# Patient Record
Sex: Male | Born: 1970
Health system: Southern US, Community
[De-identification: ages and names within clinical notes are randomized; demographics above are authoritative.]

## PROBLEM LIST (undated history)

## (undated) DIAGNOSIS — E119 Type 2 diabetes mellitus without complications: Secondary | ICD-10-CM

## (undated) DIAGNOSIS — I2510 Atherosclerotic heart disease of native coronary artery without angina pectoris: Secondary | ICD-10-CM

## (undated) DIAGNOSIS — I219 Acute myocardial infarction, unspecified: Secondary | ICD-10-CM

## (undated) DIAGNOSIS — I509 Heart failure, unspecified: Secondary | ICD-10-CM

## (undated) DIAGNOSIS — I255 Ischemic cardiomyopathy: Secondary | ICD-10-CM

## (undated) DIAGNOSIS — I519 Heart disease, unspecified: Secondary | ICD-10-CM

## (undated) HISTORY — DX: Heart disease, unspecified: I51.9

## (undated) HISTORY — DX: Type 2 diabetes mellitus without complications: E11.9

## (undated) HISTORY — PX: BACK SURGERY: SHX140

## (undated) HISTORY — PX: CARDIAC CATHETERIZATION: SHX172

---

## 1998-01-31 ENCOUNTER — Other Ambulatory Visit: Admission: RE | Admit: 1998-01-31 | Discharge: 1998-01-31 | Payer: Self-pay | Admitting: Cardiology

## 2001-06-28 ENCOUNTER — Emergency Department (HOSPITAL_COMMUNITY): Admission: AC | Admit: 2001-06-28 | Discharge: 2001-06-28 | Payer: Self-pay

## 2004-02-14 ENCOUNTER — Emergency Department (HOSPITAL_COMMUNITY): Admission: EM | Admit: 2004-02-14 | Discharge: 2004-02-14 | Payer: Self-pay | Admitting: Emergency Medicine

## 2004-06-10 ENCOUNTER — Emergency Department (HOSPITAL_COMMUNITY): Admission: EM | Admit: 2004-06-10 | Discharge: 2004-06-10 | Payer: Self-pay | Admitting: Family Medicine

## 2004-10-31 ENCOUNTER — Emergency Department: Payer: Self-pay | Admitting: Emergency Medicine

## 2004-11-01 ENCOUNTER — Emergency Department: Payer: Self-pay | Admitting: Unknown Physician Specialty

## 2004-12-24 ENCOUNTER — Emergency Department: Payer: Self-pay | Admitting: Emergency Medicine

## 2005-02-14 ENCOUNTER — Emergency Department: Payer: Self-pay | Admitting: Emergency Medicine

## 2006-06-23 ENCOUNTER — Emergency Department: Payer: Self-pay | Admitting: Emergency Medicine

## 2007-02-03 ENCOUNTER — Ambulatory Visit: Admission: RE | Admit: 2007-02-03 | Discharge: 2007-02-03 | Payer: Self-pay | Admitting: Orthopedic Surgery

## 2007-02-06 ENCOUNTER — Emergency Department: Payer: Self-pay | Admitting: Emergency Medicine

## 2007-02-24 ENCOUNTER — Inpatient Hospital Stay (HOSPITAL_COMMUNITY): Admission: RE | Admit: 2007-02-24 | Discharge: 2007-03-01 | Payer: Self-pay | Admitting: Orthopedic Surgery

## 2007-02-24 ENCOUNTER — Encounter (INDEPENDENT_AMBULATORY_CARE_PROVIDER_SITE_OTHER): Payer: Self-pay | Admitting: Orthopedic Surgery

## 2007-10-16 ENCOUNTER — Emergency Department: Payer: Self-pay | Admitting: Emergency Medicine

## 2007-12-12 ENCOUNTER — Emergency Department: Payer: Self-pay | Admitting: Emergency Medicine

## 2008-01-02 ENCOUNTER — Emergency Department: Payer: Self-pay | Admitting: Emergency Medicine

## 2008-01-07 ENCOUNTER — Emergency Department (HOSPITAL_COMMUNITY): Admission: EM | Admit: 2008-01-07 | Discharge: 2008-01-07 | Payer: Self-pay | Admitting: Emergency Medicine

## 2008-09-10 ENCOUNTER — Emergency Department: Payer: Self-pay | Admitting: Emergency Medicine

## 2009-03-12 ENCOUNTER — Emergency Department: Payer: Self-pay | Admitting: Emergency Medicine

## 2009-04-10 ENCOUNTER — Emergency Department: Payer: Self-pay | Admitting: Emergency Medicine

## 2009-04-22 ENCOUNTER — Emergency Department (HOSPITAL_COMMUNITY): Admission: EM | Admit: 2009-04-22 | Discharge: 2009-04-22 | Payer: Self-pay | Admitting: Emergency Medicine

## 2009-05-21 ENCOUNTER — Emergency Department: Payer: Self-pay | Admitting: Emergency Medicine

## 2010-02-28 ENCOUNTER — Emergency Department (HOSPITAL_COMMUNITY): Admission: EM | Admit: 2010-02-28 | Discharge: 2010-03-01 | Payer: Self-pay | Admitting: Emergency Medicine

## 2010-11-04 LAB — URINALYSIS, ROUTINE W REFLEX MICROSCOPIC
Bilirubin Urine: NEGATIVE
Glucose, UA: NEGATIVE mg/dL
Hgb urine dipstick: NEGATIVE
Ketones, ur: NEGATIVE mg/dL
Leukocytes, UA: NEGATIVE
Nitrite: NEGATIVE
Protein, ur: >300 mg/dL — AB
Specific Gravity, Urine: 1.018 (ref 1.005–1.030)
Urobilinogen, UA: 1 mg/dL (ref 0.0–1.0)
pH: 6 (ref 5.0–8.0)

## 2010-11-04 LAB — COMPREHENSIVE METABOLIC PANEL
ALT: 28 U/L (ref 0–53)
AST: 21 U/L (ref 0–37)
Albumin: 3.5 g/dL (ref 3.5–5.2)
Alkaline Phosphatase: 68 U/L (ref 39–117)
BUN: 12 mg/dL (ref 6–23)
CO2: 27 mEq/L (ref 19–32)
Calcium: 9 mg/dL (ref 8.4–10.5)
Chloride: 101 mEq/L (ref 96–112)
Creatinine, Ser: 1.1 mg/dL (ref 0.4–1.5)
GFR calc Af Amer: >60 mL/min (ref >60)
GFR calc non Af Amer: >60 mL/min (ref >60)
Glucose, Bld: 269 mg/dL — ABNORMAL HIGH (ref 70–99)
Potassium: 3.6 mEq/L (ref 3.5–5.1)
Sodium: 137 mEq/L (ref 135–145)
Total Bilirubin: 0.4 mg/dL (ref 0.3–1.2)
Total Protein: 6.8 g/dL (ref 6.0–8.3)

## 2010-11-04 LAB — CBC
HCT: 47.7 % (ref 39.0–52.0)
Hemoglobin: 16.3 g/dL (ref 13.0–17.0)
MCH: 33.3 pg (ref 26.0–34.0)
MCHC: 34.2 g/dL (ref 30.0–36.0)
MCV: 97.3 fL (ref 78.0–100.0)
Platelets: 155 10*3/uL (ref 150–400)
RBC: 4.91 MIL/uL (ref 4.22–5.81)
RDW: 12.8 % (ref 11.5–15.5)
WBC: 10.3 10*3/uL (ref 4.0–10.5)

## 2010-11-04 LAB — DIFFERENTIAL
Basophils Absolute: 0.1 10*3/uL (ref 0.0–0.1)
Basophils Relative: 1 % (ref 0–1)
Eosinophils Absolute: 0.2 10*3/uL (ref 0.0–0.7)
Eosinophils Relative: 2 % (ref 0–5)
Lymphocytes Relative: 36 % (ref 12–46)
Lymphs Abs: 3.7 10*3/uL (ref 0.7–4.0)
Monocytes Absolute: 0.6 10*3/uL (ref 0.1–1.0)
Monocytes Relative: 6 % (ref 3–12)
Neutro Abs: 5.8 10*3/uL (ref 1.7–7.7)
Neutrophils Relative %: 56 % (ref 43–77)

## 2010-11-04 LAB — URINE MICROSCOPIC-ADD ON

## 2010-11-04 LAB — GLUCOSE, CAPILLARY
Glucose-Capillary: 198 mg/dL — ABNORMAL HIGH (ref 70–99)
Glucose-Capillary: 298 mg/dL — ABNORMAL HIGH (ref 70–99)

## 2011-01-01 NOTE — Op Note (Signed)
[name], STEELY NO.:  [id number]   MEDICAL RECORD NO.:  [id number]          PATIENT TYPE:  INP   LOCATION:  5018                         FACILITY:  [facility]   PHYSICIAN:  Dimas Alexandria, MD      DATE OF BIRTH:  1970-11-11   DATE OF PROCEDURE:  02/24/2007  DATE OF DISCHARGE:                               OPERATIVE REPORT   SURGEON:  Dr. Dimas Alexandria.   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   OPERATIVE PROCEDURE:  Bilateral laminectomies, L2-3, L3-4, L4-5, L5-S1;  left L4-5 disk excision.   INDICATIONS FOR SURGERY:  Chronic disabling left sciatic pain.   OPERATIVE NOTE:  The patient was placed under general endotracheal  anesthesia.  A Foley catheter was placed in the bladder.  Bilateral  sequential compression devices were placed on both lower extremities.  Intravenous antibiotics had been administered prophylactically.  He was  positioned prone on the [place] frame.  Upper extremities were  positioned so as to avoid hyperflexion and abduction of the shoulders  and so as to avoid hyperflexion of the elbows.  The upper extremities  were padded from axilla to hands with foam.  The thighs, knees, shins  and ankles were supported on pillows.  Hair was clipped from the lumbar  area.  The midline was marked with the skin marker over the lower lumbar  spine.  The skin was prepped with DuraPrep and draped in rectangular  fashion.  The drapes were secured with Ioban.   The skin was scored in line with the mark made by the skin marker.  The  subcutaneous tissue and paraspinal muscles and fascia were injected  bilaterally with a mixture of quarter percent Marcaine plain and 1%  lidocaine with epinephrine.  Dissection was then carried down to the  lower spinous processes.  Paraspinal muscles  were then immobilized  bilaterally L2-S1.  Cross-table lateral radiograph was taken with  Kochers on the spinous processes of L2 and L4 which did conform to our  impression of level, based on identifying the last mobile segment and  counting proximally.  Should be noted an x-ray was taken layer with a  ball-tipped nerve hook in the L4-5 disk space which confirmed our  operative level for disk excision.   After soft tissue had been cleaned off the lamina and spinous processes  from L2-L5, the spinous processes were removed from L2-L5 with a Horsley  rongeur.  We then used a high-speed bur to thin out the lamina and  perform medial facetectomy at L5 bilaterally and L4 bilaterally.  Laminectomies were then completed with Kerrison rongeurs.  The same  process was repeated at L3 and L2.  We performed lateral decompression  while standing on the left side of the table of the right side of the  laminectomy.  This involved undercutting the facette joints at L2-3, L3-  4, L4-5, L5-S1.   I then changed sides of the table and  did the same on the right side,  however, the origins of the L5 nerve root were extremely tight at the L4-  5 disk level where there was a disk herniation on the MRI.  Ultimately  we performed a more significant facetectomy at L4-5 on the left-sided  order to expose some of the disk laterally and able to gain access to  it.  The disk was perforated and then curetted with Epstein curettes and  as much extruded disk as possible pushed back into the disk space and  retrieved.  However, there was tremendous adherence of the extruded disk  fragment to the overlying L5 nerve root.  Attempts to dissect this off  with a nerve hook resulted in a small tear in the root sleeve and a very  small amount of spinal fluid leakage.  I eventually desisted, feeling  that there was risking damaging the nerve root with any further  manipulation to try to free it from the adherent extruded  fragment.  The  disk herniation in this the patient is very chronic.   I eventually placed a piece of Duraform (Duragen) over the lateral  aspect of the L5 root sleeve and then placed a large muscle graft over  the entire laminectomy defect at this level at that level.  There was  this appeared to control leak well.  It was a very minor leak and there  was very small amount of CSF exuding prior to placing the Duraform.   We then performed a multilayered closure, using figure-of-eight  interrupted #1 Vicryl sutures in the paraspinal muscles subfascially and  then closing the thoracolumbar fascia with multiple interrupted figure-  of-eight #1 Vicryl sutures in an attempt to create a watertight seal.  The subcutaneous layer was closed using multiple interrupted 2-0 Vicryl  sutures in inverted fashion.  Skin was closed with subcuticular undyed  continuous Vicryl suture.  The skin edges were reinforced with Steri-  Strips and antibiotic ointment dressing applied and secured with OpSite.   The sponge, needle counts were correct.  The blood loss estimated at 1  liter.  The patient did receive a unit of Cell Saver blood back.  As  noted the complication was a very small dural leak associated with  perforation of the epineurium on the root sleeve proximally at the left  L5.      Dimas Alexandria, MD  Electronically Signed     MT/MEDQ  D:  02/24/2007  T:  02/25/2007  Job:  [id number]

## 2011-01-01 NOTE — H&P (Signed)
NAMELEONDRE, TAUL NO.:  [id number]   MEDICAL RECORD NO.:  [id number]          PATIENT TYPE:  INP   LOCATION:  NA                           FACILITY:  [facility]   PHYSICIAN:  Dimas Alexandria, MD      DATE OF BIRTH:  1971/04/16   DATE OF ADMISSION:  DATE OF DISCHARGE:                              HISTORY & PHYSICAL   CHIEF COMPLAINT:  Left leg pain with lumbar pain.  Left leg pain travels  from the posterior lumbar down the left buttock lateral aspect of left  leg to the calf.   HISTORY:  Spinal stenosis, congenital.   DRUG ALLERGIES:  No known drug allergies.   CURRENT MEDICATIONS:  Lyrica 75 mg b.i.d., Skelaxin 800 mg q.8 hours,  Vicodin 5/500 mg approximately 4 a day.   SOCIAL HISTORY:  He does smoke about 1-1/2 packs a day.  He is divorced.  He has history of gout.   PAST SURGICAL HISTORY:  Wisdom teeth extraction.   FAMILY HISTORY:  Hypertension, coronary artery disease, gout, kidney  trouble, cancer.   REVIEW OF SYSTEMS:  He reports no fever, no chills, no night sweats, no  shortness of breath, no chest pain, no bowel or bladder incontinence, no  nausea, vomiting, or diarrhea.  He does have leg pain on the left after  walking.   PHYSICAL EXAMINATION:  VITAL SIGNS:  Temperature 98.8, respirations 12,  pulse 64, blood pressure 134/80.  GENERAL:  He is well developed, well nourished.  HEENT:  Pupils are equal, round, reactive to light.  NECK:  He has active range of motion of the neck.  It is supple.  No  pain, no tenderness.  CHEST:  Clear to auscultation.  No wheezes noted.  HEART:  Regular rate and rhythm.  No murmur noted.  ABDOMEN:  Soft, nontender to palpation.  Positive bowel sounds.   IMPRESSION:  Spinal stenosis with disk herniation, L2-3, L3-4, L4-5, L5-  S1.   PLAN:  Laminectomy at L2-3, L3-4, L4-5, L5-S1 by Dr. Dimas Alexandria.      Gerri Lins, P.A.      Dimas Alexandria, MD  Electronically Signed    MC/MEDQ  D:  01/29/2007   T:  01/30/2007  Job:  [id number]

## 2011-01-04 NOTE — Discharge Summary (Signed)
[name], [name] NO.:  [id number]   MEDICAL RECORD NO.:  [id number]          PATIENT TYPE:  INP   LOCATION:  5018                         FACILITY:  [facility]   PHYSICIAN:  Gerri Lins, P.A.  DATE OF BIRTH:  [birth year]   DATE OF ADMISSION:  02/24/2007  DATE OF DISCHARGE:  03/01/2007                               DISCHARGE SUMMARY   ADMITTING DIAGNOSIS:  Spinal stenosis, congenital.   BRIEF HISTORY OF STAY:  [name] was admitted to [facility]  on February 24, 2007 for a surgical procedure by Dr. Dimas Alexandria.  Planed  procedure is laminectomy L2-3, L3-4, L4-5, L5-S1, posterior lumbar spine  secondary to spinal stenosis.  Postoperatively, the patient was in  stable condition.  Distally neurovascularly and motor intact bilateral  lower extremities.  The patient states his left foot feels better than  prior to surgery.   Post-op day 1 on February 25, 2007, the patient state she can wiggle his toes  now on the left side, and he could not do that prior to surgery.  Electrolytes were within normal limits.  Hemoglobin stable at 14.9,  white blood cell count 13.3.  We have him on flatbed rest secondary to  dural leak during the procedure.  Active range of motion of the toes and  ankles are intact and equal bilaterally.  Decreased sensation in the  left foot compared to right, and no significant change from prior to  surgery.  Calves were soft, nontender to palpation.  Dressing had two  quarter-sized bloody drainage dressings, shows no active clear fluid  drainage evidence.  Continued flatbed rest.  Advance diet once flatulent  has passed.   Post-op day 2, the patient has no headache complaints.  Left foot  sensation is returning.  He is afebrile.  Hemoglobin stable at 14.8,  white count 13.3, potassium 3.4.  Remainder of the electrolytes within  normal limits.  Distally neurovascularly and motor intact grossly.  Calves were soft, nontender to palpation.   Clean, dry dressing applied.  No active drainage at incision site.  Continue flatbed rest until February 27, 2007, 6 p.m.   Post-op day 3, active range of motion and sensation of bilateral lower  extremities has increased.  Decreased pain report.  Afebrile.  Dressing  is dry.  May begin ambulation at 1800 on February 27, 2007.   February 28, 2007, post-op day 4, patient out of bed, into chair.  States he  has no headache.  There is no drainage on the dressing.  He is afebrile.  Wound is clean and dry.  No signs of dural leak.  We are going to  discontinue the Foley, discontinue the PCA.  Continue his physical  therapy, and plan for discharge home in 1 to 2 days.   Post-op day 5, March 01, 2007, the patient is afebrile.  Vital signs are  stable.  Hemoglobin stable at 14.7, white count 7.1, dressings clean and  dry.  Incision site, no active drainage distally.  Bilateral lower  extremities are grossly neurovascular intact.  Calves are soft,  nontender  to palpation.  Plan to discharge the patient home today.   DISCHARGE DIAGNOSES:  1. Status post lumbar laminectomy, posterior lumbar spine, L2-3, L3-4,      L4-5, L5-S1.  2. Repair of dural tear.   DISCHARGE INSTRUCTIONS:  The patient will do no bending, lifting,  stooping, squatting.  He will walk for exercise daily.  We will have him  follow up with Dr. Dimas Alexandria in approximately 4 weeks.  Discharging  him with pain medications of Norco 10/325 1-2 q.4 p.r.n. for pain  control.  Dry dressing until no drainage.  He may shower.  Diet was  regular, and he is going to follow up in mid August.   PRESURGERY LABS:  Labs taken on February 19, 2007:  White count 11.3,  hemoglobin 17.6, platelets 169.  PT 12.8, INR 1.0, PTT 33.  Sodium 137,  potassium 3.9, chloride 104, CO2 of 28, glucose 152, BUN 10, and  creatinine 0.94.   DISPOSITION:  Stable.      Gerri Lins, P.A.     MC/MEDQ  D:  04/17/2007  T:  04/17/2007  Job:  [id number]

## 2011-06-04 LAB — BASIC METABOLIC PANEL
BUN: 4 — ABNORMAL LOW
BUN: 4 — ABNORMAL LOW
BUN: 5 — ABNORMAL LOW
BUN: 9
BUN: 9
CO2: 28
CO2: 29
CO2: 29
CO2: 30
CO2: 30
Calcium: 8.5
Calcium: 8.5
Calcium: 8.6
Calcium: 8.7
Calcium: 9
Chloride: 102
Chloride: 96
Chloride: 97
Chloride: 98
Chloride: 99
Creatinine, Ser: 0.89
Creatinine, Ser: 0.89
Creatinine, Ser: 0.9
Creatinine, Ser: 0.95
Creatinine, Ser: 0.99
GFR calc Af Amer: >60
GFR calc Af Amer: >60
GFR calc Af Amer: >60
GFR calc Af Amer: >60
GFR calc Af Amer: >60
GFR calc non Af Amer: >60
GFR calc non Af Amer: >60
GFR calc non Af Amer: >60
GFR calc non Af Amer: >60
GFR calc non Af Amer: >60
Glucose, Bld: 140 — ABNORMAL HIGH
Glucose, Bld: 152 — ABNORMAL HIGH
Glucose, Bld: 156 — ABNORMAL HIGH
Glucose, Bld: 160 — ABNORMAL HIGH
Glucose, Bld: 166 — ABNORMAL HIGH
Potassium: 3.1 — ABNORMAL LOW
Potassium: 3.2 — ABNORMAL LOW
Potassium: 3.4 — ABNORMAL LOW
Potassium: 3.4 — ABNORMAL LOW
Potassium: 3.7
Sodium: 135
Sodium: 135
Sodium: 136
Sodium: 136
Sodium: 138

## 2011-06-04 LAB — URINALYSIS, ROUTINE W REFLEX MICROSCOPIC
Bilirubin Urine: NEGATIVE
Glucose, UA: NEGATIVE
Hgb urine dipstick: NEGATIVE
Ketones, ur: NEGATIVE
Leukocytes, UA: NEGATIVE
Nitrite: NEGATIVE
Protein, ur: >300 — AB
Specific Gravity, Urine: 1.016
Urobilinogen, UA: 0.2
pH: 5.5

## 2011-06-04 LAB — CBC
HCT: 41.2
HCT: 41.5
HCT: 42.1
HCT: 42.7
HCT: 43
HCT: 50.1
Hemoglobin: 14.4
Hemoglobin: 14.5
Hemoglobin: 14.7
Hemoglobin: 14.8
Hemoglobin: 14.9
Hemoglobin: 17.6 — ABNORMAL HIGH
MCHC: 34.7
MCHC: 34.7
MCHC: 34.9
MCHC: 35
MCHC: 35
MCHC: 35.2
MCV: 93.7
MCV: 94.6
MCV: 94.9
MCV: 95.3
MCV: 95.6
MCV: 95.9
Platelets: 144 — ABNORMAL LOW
Platelets: 154
Platelets: 162
Platelets: 169
Platelets: 171
Platelets: 209
RBC: 4.34
RBC: 4.39
RBC: 4.4
RBC: 4.48
RBC: 4.49
RBC: 5.35
RDW: 12.5
RDW: 12.6
RDW: 12.6
RDW: 12.7
RDW: 12.8
RDW: 12.8
WBC: 11.3 — ABNORMAL HIGH
WBC: 12.3 — ABNORMAL HIGH
WBC: 13.3 — ABNORMAL HIGH
WBC: 13.3 — ABNORMAL HIGH
WBC: 7.1
WBC: 7.4

## 2011-06-04 LAB — COMPREHENSIVE METABOLIC PANEL
ALT: 28
AST: 21
Albumin: 3.8
Alkaline Phosphatase: 59
BUN: 10
CO2: 28
Calcium: 9.3
Chloride: 104
Creatinine, Ser: 0.94
GFR calc Af Amer: >60
GFR calc non Af Amer: >60
Glucose, Bld: 152 — ABNORMAL HIGH
Potassium: 3.9
Sodium: 137
Total Bilirubin: 0.8
Total Protein: 7.5

## 2011-06-04 LAB — URINE CULTURE
Colony Count: NO GROWTH
Culture: NO GROWTH

## 2011-06-04 LAB — DIFFERENTIAL
Basophils Absolute: 0
Basophils Relative: 0
Eosinophils Absolute: 0.3
Eosinophils Relative: 2
Lymphocytes Relative: 25
Lymphs Abs: 2.8
Monocytes Absolute: 0.2
Monocytes Relative: 2 — ABNORMAL LOW
Neutro Abs: 8 — ABNORMAL HIGH
Neutrophils Relative %: 71

## 2011-06-04 LAB — PROTIME-INR
INR: 1
Prothrombin Time: 12.8

## 2011-06-04 LAB — APTT: aPTT: 33

## 2011-06-04 LAB — URINE MICROSCOPIC-ADD ON

## 2011-06-04 LAB — TYPE AND SCREEN
ABO/RH(D): A POS
Antibody Screen: NEGATIVE

## 2011-06-04 LAB — HEMOGLOBIN AND HEMATOCRIT, BLOOD
HCT: 45.8
Hemoglobin: 15.9

## 2011-06-05 LAB — TYPE AND SCREEN
ABO/RH(D): A POS
Antibody Screen: NEGATIVE

## 2011-06-05 LAB — ABO/RH: ABO/RH(D): A POS

## 2011-06-06 LAB — URINE MICROSCOPIC-ADD ON
RBC / HPF: NONE SEEN
WBC, UA: NONE SEEN

## 2011-06-06 LAB — CBC
HCT: 47.6
Hemoglobin: 16.4
MCHC: 34.4
MCV: 95.3
Platelets: 198
RBC: 4.99
RDW: 12.4
WBC: 11.9 — ABNORMAL HIGH

## 2011-06-06 LAB — URINALYSIS, ROUTINE W REFLEX MICROSCOPIC
Bilirubin Urine: NEGATIVE
Glucose, UA: NEGATIVE
Hgb urine dipstick: NEGATIVE
Ketones, ur: NEGATIVE
Leukocytes, UA: NEGATIVE
Nitrite: NEGATIVE
Protein, ur: >300
Specific Gravity, Urine: 1.016 (ref 1.005–1.035)
Urobilinogen, UA: 0.2
pH: 6

## 2011-06-06 LAB — COMPREHENSIVE METABOLIC PANEL
ALT: 27
AST: 24
Albumin: 3.8
Alkaline Phosphatase: 64
BUN: 10
CO2: 24
Calcium: 9
Chloride: 106
Creatinine, Ser: 1.2
GFR calc Af Amer: >60
GFR calc non Af Amer: >60
Glucose, Bld: 107 — ABNORMAL HIGH
Potassium: 3.7
Sodium: 138
Total Bilirubin: 0.7
Total Protein: 6.9

## 2011-06-06 LAB — PROTIME-INR
INR: 1
Prothrombin Time: 13.2

## 2011-06-06 LAB — URINE CULTURE: Colony Count: 15000

## 2011-06-06 LAB — DIFFERENTIAL
Basophils Absolute: 0.1
Basophils Relative: 0
Eosinophils Absolute: 0.2
Eosinophils Relative: 2
Lymphocytes Relative: 27
Lymphs Abs: 3.2
Monocytes Absolute: 0.8 — ABNORMAL HIGH
Monocytes Relative: 7
Neutro Abs: 7.5
Neutrophils Relative %: 64

## 2011-06-06 LAB — APTT: aPTT: 33

## 2015-04-13 ENCOUNTER — Ambulatory Visit (INDEPENDENT_AMBULATORY_CARE_PROVIDER_SITE_OTHER): Payer: Self-pay | Admitting: Physician Assistant

## 2015-04-13 VITALS — BP 136/68 | HR 83 | Temp 98.6°F | Resp 18 | Ht 73.0 in | Wt 243.0 lb

## 2015-04-13 DIAGNOSIS — E119 Type 2 diabetes mellitus without complications: Secondary | ICD-10-CM

## 2015-04-13 DIAGNOSIS — R7309 Other abnormal glucose: Secondary | ICD-10-CM

## 2015-04-13 LAB — POCT URINALYSIS DIPSTICK
Blood, UA: NEGATIVE
Glucose, UA: 100
Leukocytes, UA: NEGATIVE
Nitrite, UA: NEGATIVE
Protein, UA: >=300
Spec Grav, UA: 1.025
Urobilinogen, UA: 4
pH, UA: 6.5

## 2015-04-13 LAB — POCT UA - MICROSCOPIC ONLY
Bacteria, U Microscopic: NEGATIVE
Crystals, Ur, HPF, POC: NEGATIVE
Epithelial cells, urine per micros: NEGATIVE
Mucus, UA: NEGATIVE
RBC, urine, microscopic: NEGATIVE
WBC, Ur, HPF, POC: NEGATIVE

## 2015-04-13 LAB — COMPREHENSIVE METABOLIC PANEL
ALT: 21 U/L (ref 9–46)
AST: 16 U/L (ref 10–40)
Albumin: 4.3 g/dL (ref 3.6–5.1)
Alkaline Phosphatase: 71 U/L (ref 40–115)
BUN: 18 mg/dL (ref 7–25)
CO2: 25 mmol/L (ref 20–31)
Calcium: 9.2 mg/dL (ref 8.6–10.3)
Chloride: 100 mmol/L (ref 98–110)
Creat: 0.98 mg/dL (ref 0.60–1.35)
Glucose, Bld: 231 mg/dL — ABNORMAL HIGH (ref 65–99)
Potassium: 4.1 mmol/L (ref 3.5–5.3)
Sodium: 139 mmol/L (ref 135–146)
Total Bilirubin: 0.4 mg/dL (ref 0.2–1.2)
Total Protein: 7 g/dL (ref 6.1–8.1)

## 2015-04-13 LAB — GLUCOSE, POCT (MANUAL RESULT ENTRY): POC Glucose: 241 mg/dl — AB (ref 70–99)

## 2015-04-13 LAB — LIPID PANEL
Cholesterol: 224 mg/dL — ABNORMAL HIGH (ref 125–200)
HDL: 29 mg/dL — ABNORMAL LOW (ref >=40)
LDL Cholesterol: 140 mg/dL — ABNORMAL HIGH (ref <130)
Total CHOL/HDL Ratio: 7.7 Ratio — ABNORMAL HIGH (ref <=5.0)
Triglycerides: 277 mg/dL — ABNORMAL HIGH (ref <150)
VLDL: 55 mg/dL — ABNORMAL HIGH (ref <30)

## 2015-04-13 LAB — POCT GLYCOSYLATED HEMOGLOBIN (HGB A1C): Hemoglobin A1C: 8.6

## 2015-04-13 MED ORDER — METFORMIN HCL 500 MG PO TABS: 500.0000 mg | ORAL_TABLET | Freq: Two times a day (BID) | ORAL | Status: DC

## 2015-04-13 NOTE — Patient Instructions (Signed)
Diabetes Mellitus and Food It is important for you to manage your blood sugar (glucose) level. Your blood glucose level can be greatly affected by what you eat. Eating healthier foods in the appropriate amounts throughout the day at about the same time each day will help you control your blood glucose level. It can also help slow or prevent worsening of your diabetes mellitus. Healthy eating may even help you improve the level of your blood pressure and reach or maintain a healthy weight.  HOW CAN FOOD AFFECT ME? Carbohydrates Carbohydrates affect your blood glucose level more than any other type of food. Your dietitian will help you determine how many carbohydrates to eat at each meal and teach you how to count carbohydrates. Counting carbohydrates is important to keep your blood glucose at a healthy level, especially if you are using insulin or taking certain medicines for diabetes mellitus. Alcohol Alcohol can cause sudden decreases in blood glucose (hypoglycemia), especially if you use insulin or take certain medicines for diabetes mellitus. Hypoglycemia can be a life-threatening condition. Symptoms of hypoglycemia (sleepiness, dizziness, and disorientation) are similar to symptoms of having too much alcohol.  If your health care provider has given you approval to drink alcohol, do so in moderation and use the following guidelines:  Women should not have more than one drink per day, and men should not have more than two drinks per day. One drink is equal to:  12 oz of beer.  5 oz of wine.  1 oz of hard liquor.  Do not drink on an empty stomach.  Keep yourself hydrated. Have water, diet soda, or unsweetened iced tea.  Regular soda, juice, and other mixers might contain a lot of carbohydrates and should be counted. WHAT FOODS ARE NOT RECOMMENDED? As you make food choices, it is important to remember that all foods are not the same. Some foods have fewer nutrients per serving than other  foods, even though they might have the same number of calories or carbohydrates. It is difficult to get your body what it needs when you eat foods with fewer nutrients. Examples of foods that you should avoid that are high in calories and carbohydrates but low in nutrients include:  Trans fats (most processed foods list trans fats on the Nutrition Facts label).  Regular soda.  Juice.  Candy.  Sweets, such as cake, pie, doughnuts, and cookies.  Fried foods. WHAT FOODS CAN I EAT? Have nutrient-rich foods, which will nourish your body and keep you healthy. The food you should eat also will depend on several factors, including:  The calories you need.  The medicines you take.  Your weight.  Your blood glucose level.  Your blood pressure level.  Your cholesterol level. You also should eat a variety of foods, including:  Protein, such as meat, poultry, fish, tofu, nuts, and seeds (lean animal proteins are best).  Fruits.  Vegetables.  Dairy products, such as milk, cheese, and yogurt (low fat is best).  Breads, grains, pasta, cereal, rice, and beans.  Fats such as olive oil, trans fat-free margarine, canola oil, avocado, and olives. DOES EVERYONE WITH DIABETES MELLITUS HAVE THE SAME MEAL PLAN? Because every person with diabetes mellitus is different, there is not one meal plan that works for everyone. It is very important that you meet with a dietitian who will help you create a meal plan that is just right for you. Document Released: 05/02/2005 Document Revised: 08/10/2013 Document Reviewed: 07/02/2013 Exit[employer] Patient Information 2015 [employer]. This   information is not intended to replace advice given to you by your health care provider. Make sure you discuss any questions you have with your health care provider.  

## 2015-04-13 NOTE — Progress Notes (Signed)
Subjective:    Patient ID: [name], male    DOB: [birth year], 44 y.o.   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 Told his glucose was in the 300s and was denied a DOT card until able to prove he was getting glucose under control. Has never been on diabetes medication and is looking for PCP at this time due to this problem. Recently started eating more salads, but knows his main problem is potatoes and [facility]. Drinks 2-3 20 oz [facility] sodas/day. Does not like the taste of water so does not drink much. Does not exercise, but does some walking. PMH negative. NKDA.   Review of Systems  Constitutional: Negative for fever, diaphoresis, appetite change and fatigue.  Eyes: Negative for visual disturbance.  Respiratory: Negative for cough, shortness of breath and wheezing.   Cardiovascular: Negative for chest pain, palpitations and leg swelling.  Gastrointestinal: Negative for nausea, vomiting, diarrhea and constipation.  Genitourinary: Negative for dysuria and hematuria.  Neurological: Negative for dizziness and headaches.       Objective:   Physical Exam  Constitutional: He is oriented to person, place, and time. He appears well-developed and well-nourished. No distress.  Blood pressure 136/68, pulse 83, temperature 98.6 F (37 C), temperature source Oral, resp. rate 18, height 6\' 1"  (1.854 m), weight 243 lb (110.224 kg), SpO2 98 %.  HENT:  Head: Normocephalic and atraumatic.  Right Ear: External ear normal.  Left Ear: External ear normal.  Eyes: Conjunctivae are normal. Right eye exhibits no discharge. Left eye exhibits no discharge. No scleral icterus.  Neck: Neck supple. No JVD present. No thyromegaly present.  Cardiovascular: Normal rate, regular rhythm, normal heart sounds and intact distal pulses.  Exam reveals no gallop and no friction rub.   No murmur heard. Pulmonary/Chest: Effort normal  and breath sounds normal. No respiratory distress. He has no wheezes. He has no rales.  Abdominal: Soft. Bowel sounds are normal. He exhibits no distension. There is no tenderness. There is no rebound and no guarding.  Musculoskeletal: He exhibits no edema.  Lymphadenopathy:    He has no cervical adenopathy.  Neurological: He is alert and oriented to person, place, and time.  Skin: Skin is warm and dry. No rash noted. He is not diaphoretic. No erythema. No pallor.  Psychiatric: He has a normal mood and affect. His behavior is normal. Judgment and thought content normal.   Results for orders placed or performed in visit on 04/13/15  POCT glucose (manual entry)  Result Value Ref Range   POC Glucose 241 (A) 70 - 99 mg/dl  POCT glycosylated hemoglobin (Hb A1C)  Result Value Ref Range   Hemoglobin A1C 8.6   POCT UA - Microscopic Only  Result Value Ref Range   WBC, Ur, HPF, POC Negative    RBC, urine, microscopic Negative    Bacteria, U Microscopic Negative    Mucus, UA Negative    Epithelial cells, urine per micros Negative    Crystals, Ur, HPF, POC Negative    Casts, Ur, LPF, POC     Yeast, UA    POCT urinalysis dipstick  Result Value Ref Range   Color, UA Amber    Clarity, UA Clear    Glucose, UA 100    Bilirubin, UA Small    Ketones, UA Trace    Spec Grav, UA 1.025    Blood, UA Negative    pH,  UA 6.5    Protein, UA >=300    Urobilinogen, UA 4.0    Nitrite, UA Negative    Leukocytes, UA Negative Negative      Assessment & Plan:  1. Type 2 diabetes mellitus without complication RTC 05/11/15 for glucose re-check. States he will work on trying to [employer] consumption to 2/day and increase water intake. Lifestyle modifications given.  - metFORMIN (GLUCOPHAGE) 500 MG tablet; Take 1 tablet (500 mg total) by mouth 2 (two) times daily with a meal.  Dispense: 30 tablet; Refill: 1  2. Elevated glucose - POCT glucose (manual entry) - POCT glycosylated hemoglobin (Hb  A1C) - POCT UA - Microscopic Only - POCT urinalysis dipstick - Comprehensive metabolic panel - Lipid panel     Janan Ridge PA-C  [facility] 04/13/2015 3:46 PM

## 2015-04-17 ENCOUNTER — Telehealth: Payer: Self-pay | Admitting: Physician Assistant

## 2015-04-17 NOTE — Telephone Encounter (Signed)
Relayed elevated cholesterol. Will hold off on medication at this time and will allow patient to make adjustments to diet and get DM under control. Will re-check lipids in 3 months. Will have DM f/u 4 weeks from last OV. Patient states that he has done well with medication and denies side effects of N/V or abdominal cramping. States he will be back in for f/u and does not have any questions.

## 2015-06-05 ENCOUNTER — Ambulatory Visit
Admission: EM | Admit: 2015-06-05 | Discharge: 2015-06-05 | Disposition: A | Discharge status: Home / Self Care | Payer: PRIVATE HEALTH INSURANCE | Attending: Family Medicine | Admitting: Family Medicine

## 2015-06-05 ENCOUNTER — Encounter: Payer: Self-pay | Admitting: Emergency Medicine

## 2015-06-05 DIAGNOSIS — Z029 Encounter for administrative examinations, unspecified: Secondary | ICD-10-CM | POA: Diagnosis not present

## 2015-06-05 DIAGNOSIS — Z024 Encounter for examination for driving license: Secondary | ICD-10-CM

## 2015-06-05 LAB — DEPT OF TRANSP DIPSTICK, URINE (ARMC ONLY)
Glucose, UA: 500 mg/dL — AB
Protein, ur: 100 mg/dL — AB
Specific Gravity, Urine: 1.02 (ref 1.005–1.030)

## 2015-06-05 LAB — GLUCOSE, CAPILLARY: Glucose-Capillary: 343 mg/dL — ABNORMAL HIGH (ref 65–99)

## 2015-06-05 NOTE — Discharge Instructions (Signed)
Medical Screening Exam A medical screening exam has been done. This exam helps find the cause of your problem and determines whether you need emergency treatment. Your exam has shown that you do not need emergency treatment at this point. It is safe for you to go to your caregiver's office or clinic for treatment. You should make an appointment today to see your caregiver as soon as he or she is available. Depending on your illness, your symptoms and condition can change over time. If your condition gets worse or you develop new or troubling symptoms before you see your caregiver, you should return to the emergency department for further evaluation.    This information is not intended to replace advice given to you by your health care provider. Make sure you discuss any questions you have with your health care provider.   Document Released: 09/12/2004 Document Revised: 08/26/2014 Document Reviewed: 04/24/2011 Elsevier Interactive Patient Education 2016 Else[employer] Inc[employer] Blood Glucose Monitoring, Adult Monitoring your blood glucose (also know as blood sugar) helps you to manage your diabetes. It also helps you and your health care provider monitor your diabetes and determine how well your treatment plan is working. WHY SHOULD YOU MONITOR YOUR BLOOD GLUCOSE?  It can help you understand how food, exercise, and medicine affect your blood glucose.  It allows you to know what your blood glucose is at any given moment. You can quickly tell if you are having low blood glucose (hypoglycemia) or high blood glucose (hyperglycemia).  It can help you and your health care provider know how to adjust your medicines.  It can help you understand how to manage an illness or adjust medicine for exercise. WHEN SHOULD YOU TEST? Your health care provider will help you decide how often you should check your blood glucose. This may depend on the type of diabetes you have, your diabetes control, or the types of medicines  you are taking. Be sure to write down all of your blood glucose readings so that this information can be reviewed with your health care provider. See below for examples of testing times that your health care provider may suggest. Type 1 Diabetes  Test at least 2 times per day if your diabetes is well controlled, if you are using an insulin pump, or if you perform multiple daily injections.  If your diabetes is not well controlled or if you are sick, you may need to test more often.  It is a good idea to also test:  Before every insulin injection.  Before and after exercise.  Between meals and 2 hours after a meal.  Occasionally between 2:00 a.m. and 3:00 a.m. Type 2 Diabetes  If you are taking insulin, test at least 2 times per day. However, it is best to test before every insulin injection.  If you take medicines by mouth (orally), test 2 times a day.  If you are on a controlled diet, test once a day.  If your diabetes is not well controlled or if you are sick, you may need to monitor more often. HOW TO MONITOR YOUR BLOOD GLUCOSE Supplies Needed  Blood glucose meter.  Test strips for your meter. Each meter has its own strips. You must use the strips that go with your own meter.  A pricking needle (lancet).  A device that holds the lancet (lancing device).  A journal or log book to write down your results. Procedure  Wash your hands with soap and water. Alcohol is not preferred.  Prick  the side of your finger (not the tip) with the lancet.  Gently milk the finger until a small drop of blood appears.  Follow the instructions that come with your meter for inserting the test strip, applying blood to the strip, and using your blood glucose meter. Other Areas to Get Blood for Testing Some meters allow you to use other areas of your body (other than your finger) to test your blood. These areas are called alternative sites. The most common alternative sites are:  The  forearm.  The thigh.  The back area of the lower leg.  The palm of the hand. The blood flow in these areas is slower. Therefore, the blood glucose values you get may be delayed, and the numbers are different from what you would get from your fingers. Do not use alternative sites if you think you are having hypoglycemia. Your reading will not be accurate. Always use a finger if you are having hypoglycemia. Also, if you cannot feel your lows (hypoglycemia unawareness), always use your fingers for your blood glucose checks. ADDITIONAL TIPS FOR GLUCOSE MONITORING  Do not reuse lancets.  Always carry your supplies with you.  All blood glucose meters have a 24-hour "hotline" number to call if you have questions or need help.  Adjust (calibrate) your blood glucose meter with a control solution after finishing a few boxes of strips. BLOOD GLUCOSE RECORD KEEPING It is a good idea to keep a daily record or log of your blood glucose readings. Most glucose meters, if not all, keep your glucose records stored in the meter. Some meters come with the ability to download your records to your home computer. Keeping a record of your blood glucose readings is especially helpful if you are wanting to look for patterns. Make notes to go along with the blood glucose readings because you might forget what happened at that exact time. Keeping good records helps you and your health care provider to work together to achieve good diabetes management.    This information is not intended to replace advice given to you by your health care provider. Make sure you discuss any questions you have with your health care provider.   Document Released: 08/08/2003 Document Revised: 08/26/2014 Document Reviewed: 12/28/2012 Elsevier Interactive Patient Education 2016 Else[employer] Inc. Basic Carbohydrate Counting for Diabetes Mellitus Carbohydrate counting is a method for keeping track of the amount of carbohydrates you eat. Eating  carbohydrates naturally increases the level of sugar (glucose) in your blood, so it is important for you to know the amount that is okay for you to have in every meal. Carbohydrate counting helps keep the level of glucose in your blood within normal limits. The amount of carbohydrates allowed is different for every person. A dietitian can help you calculate the amount that is right for you. Once you know the amount of carbohydrates you can have, you can count the carbohydrates in the foods you want to eat. Carbohydrates are found in the following foods:  Grains, such as breads and cereals.  Dried beans and soy products.  Starchy vegetables, such as potatoes, peas, and corn.  Fruit and fruit juices.  Milk and yogurt.  Sweets and snack foods, such as cake, cookies, candy, chips, soft drinks, and fruit drinks. CARBOHYDRATE COUNTING There are two ways to count the carbohydrates in your food. You can use either of the methods or a combination of both. Reading the "Nutrition Facts" on Packaged Food The "Nutrition Facts" is an area that is  included on the labels of almost all packaged food and beverages in the [place]. It includes the serving size of that food or beverage and information about the nutrients in each serving of the food, including the grams (g) of carbohydrate per serving.  Decide the number of servings of this food or beverage that you will be able to eat or drink. Multiply that number of servings by the number of grams of carbohydrate that is listed on the label for that serving. The total will be the amount of carbohydrates you will be having when you eat or drink this food or beverage. Learning Standard Serving Sizes of Food When you eat food that is not packaged or does not include "Nutrition Facts" on the label, you need to measure the servings in order to count the amount of carbohydrates.A serving of most carbohydrate-rich foods contains about 15 g of carbohydrates. The  following list includes serving sizes of carbohydrate-rich foods that provide 15 g ofcarbohydrate per serving:   1 slice of bread (1 oz) or 1 six-inch tortilla.    of a hamburger bun or English muffin.  4-6 crackers.   cup unsweetened dry cereal.    cup hot cereal.   cup rice or pasta.    cup mashed potatoes or  of a large baked potato.  1 cup fresh fruit or one small piece of fruit.    cup canned or frozen fruit or fruit juice.  1 cup milk.   cup plain fat-free yogurt or yogurt sweetened with artificial sweeteners.   cup cooked dried beans or starchy vegetable, such as peas, corn, or potatoes.  Decide the number of standard-size servings that you will eat. Multiply that number of servings by 15 (the grams of carbohydrates in that serving). For example, if you eat 2 cups of strawberries, you will have eaten 2 servings and 30 g of carbohydrates (2 servings x 15 g = 30 g). For foods such as soups and casseroles, in which more than one food is mixed in, you will need to count the carbohydrates in each food that is included. EXAMPLE OF CARBOHYDRATE COUNTING Sample Dinner  3 oz chicken breast.   cup of brown rice.   cup of corn.  1 cup milk.   1 cup strawberries with sugar-free whipped topping.  Carbohydrate Calculation Step 1: Identify the foods that contain carbohydrates:   Rice.   Corn.   Milk.   Strawberries. Step 2:Calculate the number of servings eaten of each:   2 servings of rice.   1 serving of corn.   1 serving of milk.   1 serving of strawberries. Step 3: Multiply each of those number of servings by 15 g:   2 servings of rice x 15 g = 30 g.   1 serving of corn x 15 g = 15 g.   1 serving of milk x 15 g = 15 g.   1 serving of strawberries x 15 g = 15 g. Step 4: Add together all of the amounts to find the total grams of carbohydrates eaten: 30 g + 15 g + 15 g + 15 g = 75 g.   This information is not intended to  replace advice given to you by your health care provider. Make sure you discuss any questions you have with your health care provider.   Document Released: 08/05/2005 Document Revised: 08/26/2014 Document Reviewed: 07/02/2013 Elsevier Interactive Patient Education 2016 Elsevier Inc. Hyperglycemia Hyperglycemia occurs when the glucose (sugar)  in your blood is too high. Hyperglycemia can happen for many reasons, but it most often happens to people who do not know they have diabetes or are not managing their diabetes properly.  CAUSES  Whether you have diabetes or not, there are other causes of hyperglycemia. Hyperglycemia can occur when you have diabetes, but it can also occur in other situations that you might not be as aware of, such as: Diabetes  If you have diabetes and are having problems controlling your blood glucose, hyperglycemia could occur because of some of the following reasons:  Not following your meal plan. Not taking your diabetes medications or not taking it properly.Obesity Obesity is defined as having too much total body fat and a body mass index (BMI) of 30 or more. BMI is an estimate of body fat and is calculated from your height and weight. BMI is typically calculated by your health care provider during regular wellness visits. Obesity happens when you consume more calories than you can burn by exercising or performing daily physical tasks. Prolonged obesity can cause major illnesses or emergencies, such as: Stroke. Heart disease. Diabetes. Cancer. Arthritis. High blood pressure (hypertension). High cholesterol. Sleep apnea. Erectile dysfunction. Infertility problems. CAUSES  Regularly eating unhealthy foods. Physical inactivity. Certain disorders, such as an underactive thyroid (hypothyroidism), Cushing's syndrome, and polycystic ovarian syndrome. Certain medicines, such as steroids, some depression medicines, and antipsychotics. Genetics. Lack of  sleep. DIAGNOSIS A health care provider can diagnose obesity after calculating your BMI. Obesity will be diagnosed if your BMI is 30 or higher. There are other methods of measuring obesity levels. Some other methods include measuring your skinfold thickness, your waist circumference, and comparing your hip circumference to your waist circumference. TREATMENT  A healthy treatment program includes some or all of the following: Long-term dietary changes. Exercise and physical activity. Behavioral and lifestyle changes. Medicine only under the supervision of your health care provider. Medicines may help, but only if they are used with diet and exercise programs. If your BMI is 40 or higher, your health care provider may recommend specialized surgery or programs to help with weight loss. An unhealthy treatment program includes: Fasting. Fad diets. Supplements and drugs. These choices do not succeed in long-term weight control. HOME CARE INSTRUCTIONS Exercise and perform physical activity as directed by your health care provider. To increase physical activity, try the following: Use stairs instead of elevators. Park farther away from store entrances. Garden, bike, or walk instead of watching television or using the computer. Eat healthy, low-calorie foods and drinks on a regular basis. Eat more fruits and vegetables. Use low-calorie cookbooks or take healthy cooking classes. Limit fast food, sweets, and processed snack foods. Eat smaller portions. Keep a daily journal of everything you eat. There are many free websites to help you with this. It may be helpful to measure your foods so you can determine if you are eating the correct portion sizes. Avoid drinking alcohol. Drink more water and drinks without calories. Take vitamins and supplements only as recommended by your health care provider. Weight-loss support groups, Government social research officer, counselors, and stress reduction education can also  be very helpful. SEEK IMMEDIATE MEDICAL CARE IF: You have chest pain or tightness. You have trouble breathing or feel short of breath. You have weakness or leg numbness. You feel confused or have trouble talking. You have sudden changes in your vision.   This information is not intended to replace advice given to you by your health care provider. Make sure  you discuss any questions you have with your health care provider.   Document Released: 09/12/2004 Document Revised: 08/26/2014 Document Reviewed: 09/11/2011 Elsevier Interactive Patient Education 2016 Elsevier Inc. DASH Eating Plan DASH stands for "Dietary Approaches to Stop Hypertension." The DASH eating plan is a healthy eating plan that has been shown to reduce high blood pressure (hypertension). Additional health benefits may include reducing the risk of type 2 diabetes mellitus, heart disease, and stroke. The DASH eating plan may also help with weight loss. WHAT DO I NEED TO KNOW ABOUT THE DASH EATING PLAN? For the DASH eating plan, you will follow these general guidelines: Choose foods with a percent daily value for sodium of less than 5% (as listed on the food label). Use salt-free seasonings or herbs instead of table salt or sea salt. Check with your health care provider or pharmacist before using salt substitutes. Eat lower-sodium products, often labeled as "lower sodium" or "no salt added." Eat fresh foods. Eat more vegetables, fruits, and low-fat dairy products. Choose whole grains. Look for the word "whole" as the first word in the ingredient list. Choose fish and skinless chicken or [place] more often than red meat. Limit fish, poultry, and meat to 6 oz (170 g) each day. Limit sweets, desserts, sugars, and sugary drinks. Choose heart-healthy fats. Limit cheese to 1 oz (28 g) per day. Eat more home-cooked food and less restaurant, buffet, and fast food. Limit fried foods. Cook foods using methods other than frying. Limit  canned vegetables. If you do use them, rinse them well to decrease the sodium. When eating at a restaurant, ask that your food be prepared with less salt, or no salt if possible. WHAT FOODS CAN I EAT? Seek help from a dietitian for individual calorie needs. Grains Whole grain or whole wheat bread. Brown rice. Whole grain or whole wheat pasta. Quinoa, bulgur, and whole grain cereals. Low-sodium cereals. Corn or whole wheat flour tortillas. Whole grain cornbread. Whole grain crackers. Low-sodium crackers. Vegetables Fresh or frozen vegetables (raw, steamed, roasted, or grilled). Low-sodium or reduced-sodium tomato and vegetable juices. Low-sodium or reduced-sodium tomato sauce and paste. Low-sodium or reduced-sodium canned vegetables.  Fruits All fresh, canned (in natural juice), or frozen fruits. Meat and Other Protein Products Ground beef (85% or leaner), grass-fed beef, or beef trimmed of fat. Skinless chicken or [place]. Ground chicken or [place]. Pork trimmed of fat. All fish and seafood. Eggs. Dried beans, peas, or lentils. Unsalted nuts and seeds. Unsalted canned beans. Dairy Low-fat dairy products, such as skim or 1% milk, 2% or reduced-fat cheeses, low-fat ricotta or cottage cheese, or plain low-fat yogurt. Low-sodium or reduced-sodium cheeses. Fats and Oils Tub margarines without trans fats. Light or reduced-fat mayonnaise and salad dressings (reduced sodium). Avocado. Safflower, olive, or canola oils. Natural peanut or almond butter. Other Unsalted popcorn and pretzels. The items listed above may not be a complete list of recommended foods or beverages. Contact your dietitian for more options. WHAT FOODS ARE NOT RECOMMENDED? Grains White bread. White pasta. White rice. Refined cornbread. Bagels and croissants. Crackers that contain trans fat. Vegetables Creamed or fried vegetables. Vegetables in a cheese sauce. Regular canned vegetables. Regular canned tomato sauce and paste. Regular  tomato and vegetable juices. Fruits Dried fruits. Canned fruit in light or heavy syrup. Fruit juice. Meat and Other Protein Products Fatty cuts of meat. Ribs, chicken wings, bacon, sausage, bologna, salami, chitterlings, fatback, hot dogs, bratwurst, and packaged luncheon meats. Salted nuts and seeds. Canned beans with salt. Dairy  Whole or 2% milk, cream, half-and-half, and cream cheese. Whole-fat or sweetened yogurt. Full-fat cheeses or blue cheese. Nondairy creamers and whipped toppings. Processed cheese, cheese spreads, or cheese curds. Condiments Onion and garlic salt, seasoned salt, table salt, and sea salt. Canned and packaged gravies. Worcestershire sauce. Tartar sauce. Barbecue sauce. Teriyaki sauce. Soy sauce, including reduced sodium. Steak sauce. Fish sauce. Oyster sauce. Cocktail sauce. Horseradish. Ketchup and mustard. Meat flavorings and tenderizers. Bouillon cubes. Hot sauce. Tabasco sauce. Marinades. Taco seasonings. Relishes. Fats and Oils Butter, stick margarine, lard, shortening, ghee, and bacon fat. Coconut, palm kernel, or palm oils. Regular salad dressings. Other Pickles and olives. Salted popcorn and pretzels. The items listed above may not be a complete list of foods and beverages to avoid. Contact your dietitian for more information. WHERE CAN I FIND MORE INFORMATION? National Heart, Lung, and Blood Institute: [employer]   This information is not intended to replace advice given to you by your health care provider. Make sure you discuss any questions you have with your health care provider.   Document Released: 07/25/2011 Document Revised: 08/26/2014 Document Reviewed: 06/09/2013 Elsevier Interactive Patient Education 2016 Elsevier Inc. Hematuria, Adult Hematuria is blood in your urine. It can be caused by a bladder infection, kidney infection, prostate infection, kidney stone, or cancer of your urinary tract. Infections can  usually be treated with medicine, and a kidney stone usually will pass through your urine. If neither of these is the cause of your hematuria, further workup to find out the reason may be needed. It is very important that you tell your health care provider about any blood you see in your urine, even if the blood stops without treatment or happens without causing pain. Blood in your urine that happens and then stops and then happens again can be a symptom of a very serious condition. Also, pain is not a symptom in the initial stages of many urinary cancers. HOME CARE INSTRUCTIONS  Drink lots of fluid, 3-4 quarts a day. If you have been diagnosed with an infection, cranberry juice is especially recommended, in addition to large amounts of water. Avoid caffeine, tea, and carbonated beverages because they tend to irritate the bladder. Avoid alcohol because it may irritate the prostate. Take all medicines as directed by your health care provider. If you were prescribed an antibiotic medicine, finish it all even if you start to feel better. If you have been diagnosed with a kidney stone, follow your health care provider's instructions regarding straining your urine to catch the stone. Empty your bladder often. Avoid holding urine for long periods of time. After a bowel movement, women should cleanse front to back. Use each tissue only once. Empty your bladder before and after sexual intercourse if you are a male. SEEK MEDICAL CARE IF: You develop back pain. You have a fever. You have a feeling of sickness in your stomach (nausea) or vomiting. Your symptoms are not better in 3 days. Return sooner if you are getting worse. SEEK IMMEDIATE MEDICAL CARE IF:  You develop severe vomiting and are unable to keep the medicine down. You develop severe back or abdominal pain despite taking your medicines. You begin passing a large amount of blood or clots in your urine. You feel extremely weak or faint, or you  pass out. MAKE SURE YOU:  Understand these instructions. Will watch your condition. Will get help right away if you are not doing well or get worse.   This information is not intended to replace  advice given to you by your health care provider. Make sure you discuss any questions you have with your health care provider.   Document Released: 08/05/2005 Document Revised: 08/26/2014 Document Reviewed: 04/05/2013 Elsevier Interactive Patient Education 2016 [employer]. Food Choices to Lower Your Triglycerides Triglycerides are a type of fat in your blood. High levels of triglycerides can increase the risk of heart disease and stroke. If your triglyceride levels are high, the foods you eat and your eating habits are very important. Choosing the right foods can help lower your triglycerides.  WHAT GENERAL GUIDELINES DO I NEED TO FOLLOW? Lose weight if you are overweight.  Limit or avoid alcohol.  Fill one half of your plate with vegetables and green salads.  Limit fruit to two servings a day. Choose fruit instead of juice.  Make one fourth of your plate whole grains. Look for the word "whole" as the first word in the ingredient list. Fill one fourth of your plate with lean protein foods. Enjoy fatty fish (such as salmon, mackerel, sardines, and tuna) three times a week.  Choose healthy fats.  Limit foods high in starch and sugar. Eat more home-cooked food and less restaurant, buffet, and fast food. Limit fried foods. Cook foods using methods other than frying. Limit saturated fats. Check ingredient lists to avoid foods with partially hydrogenated oils (trans fats) in them. WHAT FOODS CAN I EAT?  Grains Whole grains, such as whole wheat or whole grain breads, crackers, cereals, and pasta. Unsweetened oatmeal, bulgur, barley, quinoa, or brown rice. Corn or whole wheat flour tortillas.  Vegetables Fresh or frozen vegetables (raw, steamed, roasted, or grilled). Green salads. Fruits All  fresh, canned (in natural juice), or frozen fruits. Meat and Other Protein Products Ground beef (85% or leaner), grass-fed beef, or beef trimmed of fat. Skinless chicken or [place]. Ground chicken or [place]. Pork trimmed of fat. All fish and seafood. Eggs. Dried beans, peas, or lentils. Unsalted nuts or seeds. Unsalted canned or dry beans. Dairy Low-fat dairy products, such as skim or 1% milk, 2% or reduced-fat cheeses, low-fat ricotta or cottage cheese, or plain low-fat yogurt. Fats and Oils Tub margarines without trans fats. Light or reduced-fat mayonnaise and salad dressings. Avocado. Safflower, olive, or canola oils. Natural peanut or almond butter. The items listed above may not be a complete list of recommended foods or beverages. Contact your dietitian for more options. WHAT FOODS ARE NOT RECOMMENDED?  Grains White bread. White pasta. White rice. Cornbread. Bagels, pastries, and croissants. Crackers that contain trans fat. Vegetables White potatoes. Corn. Creamed or fried vegetables. Vegetables in a cheese sauce. Fruits Dried fruits. Canned fruit in light or heavy syrup. Fruit juice. Meat and Other Protein Products Fatty cuts of meat. Ribs, chicken wings, bacon, sausage, bologna, salami, chitterlings, fatback, hot dogs, bratwurst, and packaged luncheon meats. Dairy Whole or 2% milk, cream, half-and-half, and cream cheese. Whole-fat or sweetened yogurt. Full-fat cheeses. Nondairy creamers and whipped toppings. Processed cheese, cheese spreads, or cheese curds. Sweets and Desserts Corn syrup, sugars, honey, and molasses. Candy. Jam and jelly. Syrup. Sweetened cereals. Cookies, pies, cakes, donuts, muffins, and ice cream. Fats and Oils Butter, stick margarine, lard, shortening, ghee, or bacon fat. Coconut, palm kernel, or palm oils. Beverages Alcohol. Sweetened drinks (such as sodas, lemonade, and fruit drinks or punches). The items listed above may not be a complete list of foods and  beverages to avoid. Contact your dietitian for more information.   This information is not intended to replace  advice given to you by your health care provider. Make sure you discuss any questions you have with your health care provider.   Document Released: 05/23/2004 Document Revised: 08/26/2014 Document Reviewed: 06/09/2013 Elsevier Interactive Patient Education [employer]  Exercising less or doing less activity than you normally do.  Being sick. Pre-diabetes  This cannot be ignored. Before people develop Type 2 diabetes, they almost always have "pre-diabetes." This is when your blood glucose levels are higher than normal, but not yet high enough to be diagnosed as diabetes. Research has shown that some long-term damage to the body, especially the heart and circulatory system, may already be occurring during pre-diabetes. If you take action to manage your blood glucose when you have pre-diabetes, you may delay or prevent Type 2 diabetes from developing. Stress  If you have diabetes, you may be "diet" controlled or on oral medications or insulin to control your diabetes. However, you may find that your blood glucose is higher than usual in the hospital whether you have diabetes or not. This is often referred to as "stress hyperglycemia." Stress can elevate your blood glucose. This happens because of hormones put out by the body during times of stress. If stress has been the cause of your high blood glucose, it can be followed regularly by your caregiver. That way he/she can make sure your hyperglycemia does not continue to get worse or progress to diabetes. Steroids  Steroids are medications that act on the infection fighting system (immune system) to block inflammation or infection. One side effect can be a rise in blood glucose. Most people can produce enough extra insulin to allow for this rise, but for those who cannot, steroids make blood glucose levels go even higher. It is not  unusual for steroid treatments to "uncover" diabetes that is developing. It is not always possible to determine if the hyperglycemia will go away after the steroids are stopped. A special blood test called an A1c is sometimes done to determine if your blood glucose was elevated before the steroids were started. SYMPTOMS  Thirsty.  Frequent urination.  Dry mouth.  Blurred vision.  Tired or fatigue.  Weakness.  Sleepy.  Tingling in feet or leg. DIAGNOSIS  Diagnosis is made by monitoring blood glucose in one or all of the following ways:  A1c test. This is a chemical found in your blood.  Fingerstick blood glucose monitoring.  Laboratory results. TREATMENT  First, knowing the cause of the hyperglycemia is important before the hyperglycemia can be treated. Treatment may include, but is not be limited to:  Education.  Change or adjustment in medications.  Change or adjustment in meal plan.  Treatment for an illness, infection, etc.  More frequent blood glucose monitoring.  Change in exercise plan.  Decreasing or stopping steroids.  Lifestyle changes. HOME CARE INSTRUCTIONS   Test your blood glucose as directed.  Exercise regularly. Your caregiver will give you instructions about exercise. Pre-diabetes or diabetes which comes on with stress is helped by exercising.  Eat wholesome, balanced meals. Eat often and at regular, fixed times. Your caregiver or nutritionist will give you a meal plan to guide your sugar intake.  Being at an ideal weight is important. If needed, losing as little as 10 to 15 pounds may help improve blood glucose levels. SEEK MEDICAL CARE IF:   You have questions about medicine, activity, or diet.  You continue to have symptoms (problems such as increased thirst, urination, or weight gain). SEEK IMMEDIATE MEDICAL CARE  IF:   You are vomiting or have diarrhea.  Your breath smells fruity.  You are breathing faster or slower.  You are very  sleepy or incoherent.  You have numbness, tingling, or pain in your feet or hands.  You have chest pain.  Your symptoms get worse even though you have been following your caregiver's orders.  If you have any other questions or concerns.   This information is not intended to replace advice given to you by your health care provider. Make sure you discuss any questions you have with your health care provider.   Document Released: 01/29/2001 Document Revised: 10/28/2011 Document Reviewed: 04/11/2015 Elsevier Interactive Patient Education 2016 [employer] Hypoglycemia Hypoglycemia occurs when the glucose in your blood is too low. Glucose is a type of sugar that is your body's main energy source. Hormones, such as insulin and glucagon, control the level of glucose in the blood. Insulin lowers blood glucose and glucagon increases blood glucose. Having too much insulin in your blood stream, or not eating enough food containing sugar, can result in hypoglycemia. Hypoglycemia can happen to people with or without diabetes. It can develop quickly and can be a medical emergency.  CAUSES   Missing or delaying meals.  Not eating enough carbohydrates at meals.  Taking too much diabetes medicine.  Not timing your oral diabetes medicine or insulin doses with meals, snacks, and exercise.  Nausea and vomiting.  Certain medicines.  Severe illnesses, such as hepatitis, kidney disorders, and certain eating disorders.  Increased activity or exercise without eating something extra or adjusting medicines.  Drinking too much alcohol.  A nerve disorder that affects body functions like your heart rate, blood pressure, and digestion (autonomic neuropathy).  A condition where the stomach muscles do not function properly (gastroparesis). Therefore, medicines and food may not absorb properly.  Rarely, a tumor of the pancreas can produce too much insulin. SYMPTOMS   Hunger.  Sweating  (diaphoresis).  Change in body temperature.  Shakiness.  Headache.  Anxiety.  Lightheadedness.  Irritability.  Difficulty concentrating.  Dry mouth.  Tingling or numbness in the hands or feet.  Restless sleep or sleep disturbances.  Altered speech and coordination.  Change in mental status.  Seizures or prolonged convulsions.  Combativeness.  Drowsiness (lethargic).  Weakness.  Increased heart rate or palpitations.  Confusion.  Pale, gray skin color.  Blurred or double vision.  Fainting. DIAGNOSIS  A physical exam and medical history will be performed. Your caregiver may make a diagnosis based on your symptoms. Blood tests and other lab tests may be performed to confirm a diagnosis. Once the diagnosis is made, your caregiver will see if your signs and symptoms go away once your blood glucose is raised.  TREATMENT  Usually, you can easily treat your hypoglycemia when you notice symptoms.  Check your blood glucose. If it is less than 70 mg/dl, take one of the following:   3-4 glucose tablets.    cup juice.    cup regular soda.   1 cup skim milk.   -1 tube of glucose gel.   5-6 hard candies.   Avoid high-fat drinks or food that may delay a rise in blood glucose levels.  Do not take more than the recommended amount of sugary foods, drinks, gel, or tablets. Doing so will cause your blood glucose to go too high.   Wait 10-15 minutes and recheck your blood glucose. If it is still less than 70 mg/dl or below your target range, repeat  treatment.   Eat a snack if it is more than 1 hour until your next meal.  There may be a time when your blood glucose may go so low that you are unable to treat yourself at home when you start to notice symptoms. You may need someone to help you. You may even faint or be unable to swallow. If you cannot treat yourself, someone will need to bring you to the hospital.  HOME CARE INSTRUCTIONS  If you have  diabetes, follow your diabetes management plan by:  Taking your medicines as directed.  Following your exercise plan.  Following your meal plan. Do not skip meals. Eat on time.  Testing your blood glucose regularly. Check your blood glucose before and after exercise. If you exercise longer or different than usual, be sure to check blood glucose more frequently.  Wearing your medical alert jewelry that says you have diabetes.  Identify the cause of your hypoglycemia. Then, develop ways to prevent the recurrence of hypoglycemia.  Do not take a hot bath or shower right after an insulin shot.  Always carry treatment with you. Glucose tablets are the easiest to carry.  If you are going to drink alcohol, drink it only with meals.  Tell friends or family members ways to keep you safe during a seizure. This may include removing hard or sharp objects from the area or turning you on your side.  Maintain a healthy weight. SEEK MEDICAL CARE IF:   You are having problems keeping your blood glucose in your target range.  You are having frequent episodes of hypoglycemia.  You feel you might be having side effects from your medicines.  You are not sure why your blood glucose is dropping so low.  You notice a change in vision or a new problem with your vision. SEEK IMMEDIATE MEDICAL CARE IF:   Confusion develops.  A change in mental status occurs.  The inability to swallow develops.  Fainting occurs.   This information is not intended to replace advice given to you by your health care provider. Make sure you discuss any questions you have with your health care provider.   Document Released: 08/05/2005 Document Revised: 08/10/2013 Document Reviewed: 04/11/2015 Elsevier Interactive Patient Education [employer]

## 2015-06-05 NOTE — ED Notes (Signed)
Patient here for DOT physical 

## 2015-06-05 NOTE — ED Provider Notes (Signed)
CSN: [id number]     Arrival date & time 06/05/15  1331 History   First MD Initiated Contact with Patient 06/05/15 1448     Chief Complaint  Patient presents with  . 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  See [id number] for patient responses to PMHx/PSHx.  Patient reported history of back surgery and not on any medications. Smoker since age 70 1-2 PPD.  + drug screen 2013 cocaine patient reported use 1 time went to rehab program and denied use since that time and reported stopped alcohol intake 2 years ago  Reviewed Epic Care everywhere, contacted pharmacist at [facility] [place] and discussed findings with patient.  Noted last PCM visit hyperlipidemia, Type II Diabetes and due follow up labs overdue. HgbA1C 8.6 04/13/2015 Cholesterol 224 Trigs 277 HDL 24 LDL 140 ratio 7.7 FBS 241 urinalysis 100 glucose >300 protein Patient reported he went to [facility] for follow up labs instead of [facility].  Patient reported he did not take his metformin today, had mountain dew regular, sick with cold symptoms over the weekend.  Last checked fingerstick 2-3 weeks ago 170s.  Has not seen optometry in past 5 years.  First noted hyperglycemia 2008 FBS 166;  ER visit 2010 for blurry vision hyperglycemia 298 patient started on metformin per Epic records.  02/28/2010 ER visit FBS 269 Cr 1.1 GFR > 60  Denied ER visits since 2011 for hyperglycemia.  Denied LOC, incapacitation or help required from another person for high or low blood sugar since 2011 ER visit Right foot fracture 2005, history of gout patient denied flare in past 5 years.  Bilateral laminectomy L2-S1 for spinal stenosis and radicular symptoms 2008 Jul hospitalized 5 days as also had dural tear.  Radicular Symptoms resolved after surgery.  Patient reported he gained weight after back  surgery and that seems to be when blood sugars elevated.  Trying to change dietary intake to lose weight currently.  Jillyn Hidden at [facility] reported patient last filled lisinopril 5mg  po daily #30 27 Agu 2016, Carvedilol 6.25mg  po daily and Plavix 75mg  po daily #30 RF0   Patient reported he has been on blood pressure medications x 1 year with further questioning Patient reported he had failed DOT in [place] earlier this year and annotation in [facility] note Aug 2016 also.  Patient reported abdomen bulge stable for many years  FHx:  Mother-Diabetes on metformin died age 36 breast cancer, F MI age 40 deceased Sister-scleroderma deceased age 37 Brother died 1 week after birth  The history is provided by the patient.    Past Medical History  Diagnosis Date  . Diabetes mellitus without complication [facility])    Past Surgical History  Procedure Laterality Date  . Back surgery     Family History  Problem Relation Age of Onset  . Diabetes Father    Social History  Substance Use Topics  . Smoking status: Current Every Day Smoker -- 1.00 packs/day    Types: Cigarettes  . Smokeless tobacco: Never Used  . Alcohol Use: No    Review of Systems  Constitutional: Negative for fever, chills, diaphoresis, activity change, appetite change, fatigue and unexpected weight change.  HENT: Negative for congestion, dental problem, drooling, ear discharge, ear pain, facial swelling, hearing loss, mouth sores, nosebleeds, postnasal drip, rhinorrhea, sinus pressure, sneezing, sore throat, tinnitus, trouble swallowing and voice change.   Eyes: Negative for  photophobia, pain, discharge, redness, itching and visual disturbance.  Respiratory: Negative for apnea, cough, choking, chest tightness, shortness of breath, wheezing and stridor.   Cardiovascular: Negative for chest pain, palpitations and leg swelling.  Gastrointestinal: Negative for nausea, vomiting, abdominal pain, diarrhea, constipation, blood in stool and  abdominal distention.  Endocrine: Negative for cold intolerance, heat intolerance, polydipsia, polyphagia and polyuria.  Genitourinary: Negative for dysuria, urgency, frequency, hematuria, flank pain, decreased urine volume, discharge, penile swelling, scrotal swelling, enuresis, difficulty urinating, genital sores, penile pain and testicular pain.  Musculoskeletal: Positive for back pain. Negative for myalgias, joint swelling, arthralgias, gait problem and neck pain.  Skin: Negative.  Negative for color change, pallor, rash and wound.  Allergic/Immunologic: Negative for environmental allergies and food allergies.  Neurological: Negative for dizziness, tremors, seizures, syncope, facial asymmetry, speech difficulty, weakness, light-headedness, numbness and headaches.  Hematological: Negative for adenopathy. Does not bruise/bleed easily.  Psychiatric/Behavioral: Negative for suicidal ideas, behavioral problems, confusion, sleep disturbance and agitation.    Allergies  Review of patient's allergies indicates no known allergies.  Home Medications   Prior to Admission medications   Medication Sig Start Date End Date Taking? Authorizing Provider  metFORMIN (GLUCOPHAGE) 500 MG tablet Take 1 tablet (500 mg total) by mouth 2 (two) times daily with a meal. 04/13/15   Tishira R Brewington, PA-C   Meds Ordered and Administered this Visit  Medications - No data to display  BP 116/71 mmHg  Pulse 84  Temp(Src) 98.4 F (36.9 C) (Tympanic)  Resp 16  Ht 6' 0.5" (1.842 m)  Wt 247 lb (112.038 kg)  BMI 33.02 kg/m2  SpO2 98% No data found.   Physical Exam  Constitutional: He is oriented to person, place, and time. Vital signs are normal. He appears well-developed and well-nourished. He is active and cooperative.  Non-toxic appearance. He does not have a sickly appearance. He does not appear ill. No distress.  HENT:  Head: Normocephalic and atraumatic.  Right Ear: Hearing, tympanic membrane, external  ear and ear canal normal.  Left Ear: Hearing, tympanic membrane, external ear and ear canal normal.  Nose: Nose normal. No mucosal edema, rhinorrhea, nose lacerations, sinus tenderness, nasal deformity, septal deviation or nasal septal hematoma. No epistaxis.  No foreign bodies. Right sinus exhibits no maxillary sinus tenderness and no frontal sinus tenderness. Left sinus exhibits no maxillary sinus tenderness and no frontal sinus tenderness.  Mouth/Throat: Uvula is midline, oropharynx is clear and moist and mucous membranes are normal. Mucous membranes are not pale, not dry and not cyanotic. He does not have dentures. No oral lesions. No trismus in the jaw. Normal dentition. No dental abscesses, uvula swelling, lacerations or dental caries. No oropharyngeal exudate, posterior oropharyngeal edema, posterior oropharyngeal erythema or tonsillar abscesses.  Eyes: Conjunctivae, EOM and lids are normal. Pupils are equal, round, and reactive to light. Right eye exhibits no chemosis, no discharge, no exudate and no hordeolum. No foreign body present in the right eye. Left eye exhibits no chemosis, no discharge, no exudate and no hordeolum. No foreign body present in the left eye. Right conjunctiva is not injected. Right conjunctiva has no hemorrhage. Left conjunctiva is not injected. Left conjunctiva has no hemorrhage. No scleral icterus. Right eye exhibits normal extraocular motion and no nystagmus. Left eye exhibits normal extraocular motion and no nystagmus. Right pupil is round and reactive. Left pupil is round and reactive. Pupils are equal.  Neck: Trachea normal and normal range of motion. Neck supple. Normal carotid pulses present. No tracheal  tenderness, no spinous process tenderness and no muscular tenderness present. Carotid bruit is not present. No rigidity. No tracheal deviation, no edema, no erythema and normal range of motion present. No thyroid mass and no thyromegaly present.  Cardiovascular: Normal  rate, regular rhythm, S1 normal, S2 normal, normal heart sounds and intact distal pulses.  PMI is not displaced.  Exam reveals no gallop, no distant heart sounds, no friction rub and no decreased pulses.   No murmur heard. Pulses:      Radial pulses are 2+ on the right side, and 2+ on the left side.       Posterior tibial pulses are 2+ on the right side, and 2+ on the left side.  Pulmonary/Chest: Effort normal and breath sounds normal. No accessory muscle usage or stridor. No respiratory distress. He has no decreased breath sounds. He has no wheezes. He has no rhonchi. He has no rales.  Abdominal: Soft. Bowel sounds are normal. He exhibits no shifting dullness, no distension, no pulsatile liver, no fluid wave, no abdominal bruit, no ascites, no pulsatile midline mass and no mass. There is no hepatosplenomegaly. There is no tenderness. There is no rigidity, no rebound, no guarding, no CVA tenderness, no tenderness at McBurney's point and negative Murphy's sign. Hernia confirmed negative in the ventral area, confirmed negative in the right inguinal area and confirmed negative in the left inguinal area.    Abdominus rectus diastasis with abdominal crunch 5x7cm bulge resolves with sitting, standing, lying  on back NOT TTP; dull to percussion x 4 quads  Genitourinary: Testes normal and penis normal. Cremasteric reflex is present. Right testis shows no mass, no swelling and no tenderness. Cremasteric reflex is not absent on the right side. Left testis shows no mass, no swelling and no tenderness. Cremasteric reflex is not absent on the left side. Circumcised. No phimosis, hypospadias or penile tenderness. No discharge found.  Chaperoned by RN Jacklynn Lewis; tinea cruris noted during inguinal hernia exam discussed with patient to buy OTC cream and apply per manufacturers instructions and completely dry off after showering/change sweat soaked clothes if possible to prevent reoccurence  Musculoskeletal: Normal  range of motion. He exhibits no edema or tenderness.       Right shoulder: Normal.       Left shoulder: Normal.       Right elbow: Normal.      Left elbow: Normal.       Right wrist: Normal.       Left wrist: Normal.       Right hip: Normal.       Left hip: Normal.       Right knee: Normal.       Left knee: Normal.       Right ankle: Normal.       Left ankle: Normal.       Cervical back: Normal.       Thoracic back: Normal.       Lumbar back: Normal.       Right hand: Normal.       Left hand: Normal.       Right lower leg: Normal.       Left lower leg: Normal.       Right foot: Normal.       Left foot: Normal.  Lymphadenopathy:       Head (right side): No submental, no submandibular, no tonsillar, no preauricular, no posterior auricular and no occipital adenopathy present.  Head (left side): No submental, no submandibular, no tonsillar, no preauricular, no posterior auricular and no occipital adenopathy present.    He has no cervical adenopathy.       Right cervical: No superficial cervical, no deep cervical and no posterior cervical adenopathy present.      Left cervical: No superficial cervical, no deep cervical and no posterior cervical adenopathy present.       Right: No inguinal and no supraclavicular adenopathy present.       Left: No inguinal and no supraclavicular adenopathy present.  Neurological: He is alert and oriented to person, place, and time. He has normal strength. He is not disoriented. He displays no atrophy and no tremor. No cranial nerve deficit or sensory deficit. He exhibits normal muscle tone. He displays no seizure activity. Coordination and gait normal. GCS eye subscore is 4. GCS verbal subscore is 5. GCS motor subscore is 6.  Reflex Scores:      Patellar reflexes are 1+ on the right side and 1+ on the left side.      Achilles reflexes are 1+ on the right side and 1+ on the left side. Skin: Skin is warm, dry and intact. Rash noted. No abrasion, no  bruising, no burn, no ecchymosis, no laceration, no lesion, no petechiae and no purpura noted. Rash is macular. Rash is not papular, not maculopapular, not nodular, not pustular, not vesicular and not urticarial. He is not diaphoretic. No cyanosis or erythema. No pallor. Nails show no clubbing.     Macular erythematous rash dry medial thigh adjacent to scrotal sac/inguinal folds  Psychiatric: He has a normal mood and affect. His speech is normal and behavior is normal. Judgment and thought content normal. Cognition and memory are normal.  Nursing note and vitals reviewed.   ED Course  Procedures (including critical care time)  Labs Review Labs Reviewed  DEPT OF TRANSP DIPSTICK, URINE([facility] ONLY) - Abnormal; Notable for the following:    Protein, ur 100 (*)    Glucose, UA 500 (*)    Hgb urine dipstick TRACE (*)    All other components within normal limits  GLUCOSE, CAPILLARY - Abnormal; Notable for the following:    Glucose-Capillary 343 (*)    All other components within normal limits  CBG MONITORING, ED    Imaging Review No results found.   Visual Acuity Review  Right Eye Distance: 20/25 uncorrected Left Eye Distance: 20/20 uncorrected Bilateral Distance: 20/15 uncorrected  Right Eye Near:   Left Eye Near:    Bilateral Near:     Discussed urinalysis results with patient abnormal compared to previous, given copy of results.  Discussed will need to obtain finger stick today patient agreed completed by RN Jacklynn Lewis   Discussed with patient 343 elevated above goal.  Hydrate.  Take metformin when he gets home today, avoid concentrated sugars, any more mountain dew regular today.  Water only no sweetened drinks, desserts.  Protein and vegetables recommended for dinner tonight.  Patient given exitcare handouts on carb counting, dash diet, type II diabetes, obesity, hypertension.  Discussed target blood sugar 130 or less fasting and HgbA1c 7 or less best to avoid end  organ damage  Yearly diabetic eye exam recommended with provider of choice.  Blood pressure controlled with current regimen.  Diet soda if drinking soda or drinks sweetened with sweet and low, splenda, sugar alternative instead of regular sugar.Patient verbalized understanding of information/instructions, agreed with plan of care  And had no further questions at  this time.   MDM   1. Encounter for Department of Transportation (DOT) examination for driving license renewal    Information entered in national database given 6 month certificate due uncontrolled diabetes (>7 but <10) and Patient was reluctant to share his entire medical history and only responded after I performed chart review of Epic and discussed visits with him along with Rx received from [facility] verified with pharmacist Discussed overdue to have diabetic eye exam.  Encouraged weight loss to BMI less than 30 227lbs over next 6 months and best if BMI less than 25 187lbs.  Ventral hernia/diastasis abdominus rectus 5x7cm FS 343 today did not take metformin. Discussed must carry glucometer with him when driving, spare batteries, form of sugar and check fingerstick prior to driving. Discussed at length end organ effects of chronic hyperglycemia or uncontrolled hypertension. Follow up with PCM if rash groin tinea cruris does not resolve with OTC treatment See copies in record manager for [facility] 5875, [id number].  Patient instructed to follow up for re-evaluation in 6 months sooner if changes in his health status e.g. diagnosis chronic disease, surgery, hospitalization. Discussed with patient urinalysis was abnormal. If patient not seen by [facility] provider must bring copy of labs especially HgbA1c within previous 3 months. Patient verbalized understanding of information/instructions, agreed with plan of care and had no further questions at this time.    Barbaraann Barthel, NP 06/07/15 [phone]

## 2015-12-05 ENCOUNTER — Encounter: Payer: Self-pay | Admitting: Family Medicine

## 2015-12-05 ENCOUNTER — Ambulatory Visit (INDEPENDENT_AMBULATORY_CARE_PROVIDER_SITE_OTHER): Payer: Self-pay | Admitting: Family Medicine

## 2015-12-05 VITALS — BP 118/72 | HR 79 | Temp 98.2°F | Ht 73.0 in | Wt 236.0 lb

## 2015-12-05 DIAGNOSIS — I10 Essential (primary) hypertension: Secondary | ICD-10-CM

## 2015-12-05 DIAGNOSIS — IMO0002 Reserved for concepts with insufficient information to code with codable children: Secondary | ICD-10-CM

## 2015-12-05 DIAGNOSIS — E1159 Type 2 diabetes mellitus with other circulatory complications: Secondary | ICD-10-CM

## 2015-12-05 DIAGNOSIS — E1165 Type 2 diabetes mellitus with hyperglycemia: Secondary | ICD-10-CM

## 2015-12-05 DIAGNOSIS — Z720 Tobacco use: Secondary | ICD-10-CM

## 2015-12-05 DIAGNOSIS — I2510 Atherosclerotic heart disease of native coronary artery without angina pectoris: Secondary | ICD-10-CM

## 2015-12-05 DIAGNOSIS — Z0000 Encounter for general adult medical examination without abnormal findings: Secondary | ICD-10-CM

## 2015-12-05 DIAGNOSIS — E785 Hyperlipidemia, unspecified: Secondary | ICD-10-CM

## 2015-12-05 LAB — MICROALBUMIN / CREATININE URINE RATIO
Creatinine,U: 58.5 mg/dL
Microalb Creat Ratio: 148.9 mg/g — ABNORMAL HIGH (ref 0.0–30.0)
Microalb, Ur: 87.1 mg/dL — ABNORMAL HIGH (ref 0.0–1.9)

## 2015-12-05 LAB — COMPREHENSIVE METABOLIC PANEL
ALT: 22 U/L (ref 0–53)
AST: 14 U/L (ref 0–37)
Albumin: 4 g/dL (ref 3.5–5.2)
Alkaline Phosphatase: 96 U/L (ref 39–117)
BUN: 10 mg/dL (ref 6–23)
CO2: 28 mEq/L (ref 19–32)
Calcium: 9.5 mg/dL (ref 8.4–10.5)
Chloride: 100 mEq/L (ref 96–112)
Creatinine, Ser: 0.91 mg/dL (ref 0.40–1.50)
GFR: 95.65 mL/min (ref >60.00)
Glucose, Bld: 378 mg/dL — ABNORMAL HIGH (ref 70–99)
Potassium: 3.5 mEq/L (ref 3.5–5.1)
Sodium: 136 mEq/L (ref 135–145)
Total Bilirubin: 0.4 mg/dL (ref 0.2–1.2)
Total Protein: 7.4 g/dL (ref 6.0–8.3)

## 2015-12-05 LAB — CBC
HCT: 47.8 % (ref 39.0–52.0)
Hemoglobin: 16.6 g/dL (ref 13.0–17.0)
MCHC: 34.7 g/dL (ref 30.0–36.0)
MCV: 93.4 fl (ref 78.0–100.0)
Platelets: 196 10*3/uL (ref 150.0–400.0)
RBC: 5.12 Mil/uL (ref 4.22–5.81)
RDW: 12.8 % (ref 11.5–15.5)
WBC: 9.8 10*3/uL (ref 4.0–10.5)

## 2015-12-05 LAB — LIPID PANEL
Cholesterol: 194 mg/dL (ref 0–200)
HDL: 34.9 mg/dL — ABNORMAL LOW (ref >39.00)
NonHDL: 158.97
Total CHOL/HDL Ratio: 6
Triglycerides: 211 mg/dL — ABNORMAL HIGH (ref 0.0–149.0)
VLDL: 42.2 mg/dL — ABNORMAL HIGH (ref 0.0–40.0)

## 2015-12-05 LAB — HEMOGLOBIN A1C: Hgb A1c MFr Bld: 12.3 % — ABNORMAL HIGH (ref 4.6–6.5)

## 2015-12-05 LAB — LDL CHOLESTEROL, DIRECT: Direct LDL: 132 mg/dL

## 2015-12-05 MED ORDER — BUPROPION HCL ER (SR) 150 MG PO TB12
ORAL_TABLET | ORAL | Status: DC
Start: 2015-12-05 — End: 2020-09-28

## 2015-12-05 NOTE — Progress Notes (Signed)
Pre visit review using our clinic review tool, if applicable. No additional management support is needed unless otherwise documented below in the visit note. 

## 2015-12-05 NOTE — Patient Instructions (Signed)
Call me with the list of your medications.  We will call you tomorrow with your lab results. Follow up to be determined after labs return.  See if you can afford the Wellbutrin - Therapy should begin at least 1 week before target quit date. Target quit dates are generally in the second week of treatment.  Take care  Dr. Adriana Simas

## 2015-12-06 DIAGNOSIS — I2510 Atherosclerotic heart disease of native coronary artery without angina pectoris: Secondary | ICD-10-CM | POA: Insufficient documentation

## 2015-12-06 DIAGNOSIS — Z0000 Encounter for general adult medical examination without abnormal findings: Secondary | ICD-10-CM | POA: Insufficient documentation

## 2015-12-06 DIAGNOSIS — IMO0002 Reserved for concepts with insufficient information to code with codable children: Secondary | ICD-10-CM | POA: Insufficient documentation

## 2015-12-06 DIAGNOSIS — E1159 Type 2 diabetes mellitus with other circulatory complications: Secondary | ICD-10-CM | POA: Insufficient documentation

## 2015-12-06 DIAGNOSIS — Z720 Tobacco use: Secondary | ICD-10-CM | POA: Insufficient documentation

## 2015-12-06 DIAGNOSIS — E1169 Type 2 diabetes mellitus with other specified complication: Secondary | ICD-10-CM | POA: Insufficient documentation

## 2015-12-06 DIAGNOSIS — E785 Hyperlipidemia, unspecified: Secondary | ICD-10-CM | POA: Insufficient documentation

## 2015-12-06 DIAGNOSIS — E1165 Type 2 diabetes mellitus with hyperglycemia: Secondary | ICD-10-CM

## 2015-12-06 DIAGNOSIS — I10 Essential (primary) hypertension: Secondary | ICD-10-CM | POA: Insufficient documentation

## 2015-12-06 MED ORDER — ATORVASTATIN CALCIUM 40 MG PO TABS: 40.0000 mg | ORAL_TABLET | Freq: Every day | ORAL | Status: DC

## 2015-12-06 MED ORDER — GLUCOSE BLOOD VI STRP: ORAL_STRIP | Status: DC

## 2015-12-06 MED ORDER — RELION LANCETS ULTRA-THIN 30G MISC: Status: DC

## 2015-12-06 MED ORDER — LISINOPRIL 5 MG PO TABS: 5.0000 mg | ORAL_TABLET | Freq: Every day | ORAL | Status: DC

## 2015-12-06 MED ORDER — CLOPIDOGREL BISULFATE 75 MG PO TABS: 75.0000 mg | ORAL_TABLET | Freq: Every day | ORAL | Status: DC

## 2015-12-06 MED ORDER — METFORMIN HCL 500 MG PO TABS: 500.0000 mg | ORAL_TABLET | Freq: Two times a day (BID) | ORAL | Status: DC

## 2015-12-06 MED ORDER — RELION PRIME MONITOR DEVI: Status: DC

## 2015-12-06 MED ORDER — CARVEDILOL 3.125 MG PO TABS: 3.1250 mg | ORAL_TABLET | Freq: Two times a day (BID) | ORAL | Status: DC

## 2015-12-06 NOTE — Assessment & Plan Note (Addendum)
Declines Pneumovax. Tdap up today. Labs today. Encouraged tobacco cessation.

## 2015-12-06 NOTE — Progress Notes (Signed)
Subjective:  Patient ID: [name], male    DOB: [birth year]  Age: 45 y.o. MRN: [id number]  CC: Establish care  HPI [name] is a 45 y.o. male presents to the clinic today to establish care.  Preventative Healthcare  Colonoscopy: Not indicated.  Immunizations  Tetanus - up-to-date.  Pneumococcal - declines.  Flu - Not indicated at this time.  Zoster - Not indicated.   Labs: Screening labs today.   Exercise: No.  Alcohol use: See below.   Smoking/tobacco use: Current smoker.  CAD  S/p PCI; Unclear where.  Currently on ASA, Plavix, Lisinopril, and Coreg.  No recent chest pain or SOB.  DM-2  Patient reports that he's been a diabetic for the past few years.  He recently went to urgent care for DOT physical and his blood sugar was elevated.  As a result, he was told to follow-up with her primary care physician.  He is not currently taking any medications. He needs a refill on his metformin.  Recent A1c not available.  He is not checking his sugars.  PMH, Surgical Hx, Family Hx, Social History reviewed and updated as below.  Past Medical History  Diagnosis Date  . Diabetes mellitus without complication ([place])   . Heart disease    Past Surgical History  Procedure Laterality Date  . Back surgery     Family History  Problem Relation Age of Onset  . Diabetes Father   . Heart disease Father   . Breast cancer Mother   . Lung cancer Paternal Grandmother    Social History  Substance Use Topics  . Smoking status: Current Every Day Smoker -- 1.00 packs/day    Types: Cigarettes  . Smokeless tobacco: Never Used  . Alcohol Use: 0.0 - 0.6 oz/week    0-1 Standard drinks or equivalent per week   Review of Systems  Constitutional: Negative for fever and chills.  HENT: Negative for congestion, rhinorrhea and sore throat.   Eyes: Negative for pain and visual disturbance.  Respiratory: Negative for cough and shortness of breath.   Cardiovascular:  Negative for chest pain.  Gastrointestinal: Negative for nausea, vomiting, abdominal pain, diarrhea and constipation.  Genitourinary: Negative for dysuria, urgency and frequency.  Musculoskeletal: Negative for arthralgias.  Skin: Negative for rash.  Neurological: Negative for dizziness and light-headedness.   Objective:   Today's Vitals: BP 118/72 mmHg  Pulse 79  Temp(Src) 98.2 F (36.8 C) (Oral)  Ht 6' 1" (1.854 m)  Wt 236 lb (107.049 kg)  BMI 31.14 kg/m2  SpO2 95%  Physical Exam  Constitutional: He is oriented to person, place, and time. He appears well-developed. No distress.  Obese.   HENT:  Head: Normocephalic and atraumatic.  Nose: Nose normal.  Mouth/Throat: Oropharynx is clear and moist. No oropharyngeal exudate.  Normal TM's bilaterally.   Eyes: Conjunctivae are normal. No scleral icterus.  Neck: Neck supple. No thyromegaly present.  Cardiovascular: Normal rate and regular rhythm.   No murmur heard. Pulmonary/Chest: Effort normal and breath sounds normal. He has no wheezes. He has no rales.  Abdominal: Soft. He exhibits no distension. There is no tenderness. There is no rebound and no guarding.  Musculoskeletal: Normal range of motion. He exhibits no edema.  Lymphadenopathy:    He has no cervical adenopathy.  Neurological: He is alert and oriented to person, place, and time.  Skin: Skin is warm and dry. No rash noted.  Psychiatric: He has a normal mood and affect.  Vitals reviewed.  Assessment & Plan:   Problem List Items Addressed This Visit    Coronary artery disease involving native coronary artery of native heart without angina pectoris    Stable. Compliant with ASA, Plavix, Coreg, lisinopril. Needs Statin. Adding Lipitor if he can afford.       Relevant Medications   carvedilol (COREG) 3.125 MG tablet   lisinopril (PRINIVIL,ZESTRIL) 5 MG tablet   atorvastatin (LIPITOR) 40 MG tablet   Other Relevant Orders   CBC (Completed)   Comp Met (CMET)  (Completed)   Lipid Profile (Completed)   Uncontrolled type 2 diabetes mellitus with circulatory disorder, without long-term current use of insulin (HCC)    A1c was obtained and was elevated at 12.3. Refilling Metformin. Will need insulin or referral to Endo.      Relevant Medications   carvedilol (COREG) 3.125 MG tablet   lisinopril (PRINIVIL,ZESTRIL) 5 MG tablet   metFORMIN (GLUCOPHAGE) 500 MG tablet   atorvastatin (LIPITOR) 40 MG tablet   Other Relevant Orders   HgB A1c (Completed)   Urine Microalbumin w/creat. ratio (Completed)   Preventative health care - Primary    Declines Pneumovax. Tdap up today. Labs today. Encouraged tobacco cessation.       Essential hypertension    Well controlled on ACEI and Coreg. Will continue.       Relevant Medications   carvedilol (COREG) 3.125 MG tablet   lisinopril (PRINIVIL,ZESTRIL) 5 MG tablet   atorvastatin (LIPITOR) 40 MG tablet   Hyperlipidemia   Relevant Medications   carvedilol (COREG) 3.125 MG tablet   lisinopril (PRINIVIL,ZESTRIL) 5 MG tablet   atorvastatin (LIPITOR) 40 MG tablet   Tobacco abuse    Starting on Wellbutrin.         Outpatient Encounter Prescriptions as of 12/05/2015  Medication Sig  . carvedilol (COREG) 3.125 MG tablet Take 1 tablet (3.125 mg total) by mouth 2 (two) times daily with a meal.  . lisinopril (PRINIVIL,ZESTRIL) 5 MG tablet Take 1 tablet (5 mg total) by mouth daily.  . [DISCONTINUED] carvedilol (COREG) 3.125 MG tablet Take 3.125 mg by mouth 2 (two) times daily with a meal.  . [DISCONTINUED] lisinopril (PRINIVIL,ZESTRIL) 5 MG tablet Take 5 mg by mouth daily.  . [DISCONTINUED] ticagrelor (BRILINTA) 90 MG TABS tablet Take 90 mg by mouth once.  Marland Kitchen atorvastatin (LIPITOR) 40 MG tablet Take 1 tablet (40 mg total) by mouth daily.  Marland Kitchen buPROPion (WELLBUTRIN SR) 150 MG 12 hr tablet 1 tablet daily for 3 days. Then increase to 1 tablet twice daily.  . clopidogrel (PLAVIX) 75 MG tablet Take 1 tablet (75 mg  total) by mouth daily. Reported on 12/05/2015  . metFORMIN (GLUCOPHAGE) 500 MG tablet Take 1 tablet (500 mg total) by mouth 2 (two) times daily with a meal.  . [DISCONTINUED] clopidogrel (PLAVIX) 75 MG tablet Take 75 mg by mouth daily. Reported on 12/05/2015  . [DISCONTINUED] metFORMIN (GLUCOPHAGE) 500 MG tablet Take 1 tablet (500 mg total) by mouth 2 (two) times daily with a meal. (Patient not taking: Reported on 12/05/2015)   No facility-administered encounter medications on file as of 12/05/2015.    Follow-up: 2 weeks - 1 month.   Lynn

## 2015-12-06 NOTE — Assessment & Plan Note (Signed)
Starting on Wellbutrin.

## 2015-12-06 NOTE — Assessment & Plan Note (Signed)
A1c was obtained and was elevated at 12.3. Refilling Metformin. Will need insulin or referral to Endo.

## 2015-12-06 NOTE — Assessment & Plan Note (Signed)
Well controlled on ACEI and Coreg. Will continue.

## 2015-12-06 NOTE — Assessment & Plan Note (Signed)
Stable. Compliant with ASA, Plavix, Coreg, lisinopril. Needs Statin. Adding Lipitor if he can afford.

## 2015-12-08 ENCOUNTER — Other Ambulatory Visit: Payer: Self-pay | Admitting: Family Medicine

## 2015-12-08 DIAGNOSIS — E1159 Type 2 diabetes mellitus with other circulatory complications: Secondary | ICD-10-CM

## 2015-12-08 MED ORDER — EMPAGLIFLOZIN 10 MG PO TABS: 10.0000 mg | ORAL_TABLET | Freq: Every day | ORAL | Status: DC

## 2016-06-15 ENCOUNTER — Emergency Department
Admission: EM | Admit: 2016-06-15 | Discharge: 2016-06-15 | Disposition: A | Discharge status: Home / Self Care | Payer: Self-pay | Attending: Emergency Medicine | Admitting: Emergency Medicine

## 2016-06-15 DIAGNOSIS — I2510 Atherosclerotic heart disease of native coronary artery without angina pectoris: Secondary | ICD-10-CM | POA: Insufficient documentation

## 2016-06-15 DIAGNOSIS — I10 Essential (primary) hypertension: Secondary | ICD-10-CM | POA: Insufficient documentation

## 2016-06-15 DIAGNOSIS — F17210 Nicotine dependence, cigarettes, uncomplicated: Secondary | ICD-10-CM | POA: Insufficient documentation

## 2016-06-15 DIAGNOSIS — Z7984 Long term (current) use of oral hypoglycemic drugs: Secondary | ICD-10-CM | POA: Insufficient documentation

## 2016-06-15 DIAGNOSIS — L02414 Cutaneous abscess of left upper limb: Secondary | ICD-10-CM | POA: Insufficient documentation

## 2016-06-15 DIAGNOSIS — E119 Type 2 diabetes mellitus without complications: Secondary | ICD-10-CM | POA: Insufficient documentation

## 2016-06-15 DIAGNOSIS — Z79899 Other long term (current) drug therapy: Secondary | ICD-10-CM | POA: Insufficient documentation

## 2016-06-15 MED ORDER — HYDROCODONE-ACETAMINOPHEN 5-325 MG PO TABS: 1.0000 | ORAL_TABLET | ORAL | 0 refills | Status: DC | PRN

## 2016-06-15 MED ORDER — SULFAMETHOXAZOLE-TRIMETHOPRIM 800-160 MG PO TABS: 1.0000 | ORAL_TABLET | Freq: Two times a day (BID) | ORAL | 0 refills | Status: DC

## 2016-06-15 MED ORDER — LIDOCAINE HCL (PF) 1 % IJ SOLN
5.0000 mL | Freq: Once | INTRAMUSCULAR | Status: DC
  Filled 2016-06-15: qty 5

## 2016-06-15 NOTE — ED Notes (Signed)
Pt states understanding of discharge instructions. NAD noted at this time.  

## 2016-06-15 NOTE — ED Triage Notes (Signed)
Abscess Left upper arm since Thursday.

## 2016-06-15 NOTE — ED Notes (Signed)
Large red raised area to left upper arm  States area has been there for 3-4 days

## 2016-06-15 NOTE — ED Provider Notes (Signed)
[facility] Emergency Department Provider Note  ____________________________________________   First MD Initiated Contact with Patient 06/15/16 1703     (approximate)  I have reviewed the triage vital signs and the nursing notes.   HISTORY  Chief Complaint Abscess    HPI [name] is a 45 y.o. male complaint of abscess to his upper left arm. Patient states that there is been there for 3-4 days. Patient has a history of abscesses. He denies any fever or chills. There's been no nausea or vomiting. Patient has not taken any over-the-counter medication prior to his arrival in the emergency room. Patient denies anyone diagnosing him with MRSA. Currently he rates his pain as a 2/10.   Past Medical History:  Diagnosis Date  . Diabetes mellitus without complication (HCC)   . Heart disease     Patient Active Problem List   Diagnosis Date Noted  . Coronary artery disease involving native coronary artery of native heart without angina pectoris 12/06/2015  . Uncontrolled type 2 diabetes mellitus with circulatory disorder, without long-term current use of insulin (HCC) 12/06/2015  . Preventative health care 12/06/2015  . Essential hypertension 12/06/2015  . Hyperlipidemia 12/06/2015  . Tobacco abuse 12/06/2015    Past Surgical History:  Procedure Laterality Date  . BACK SURGERY      Prior to Admission medications   Medication Sig Start Date End Date Taking? Authorizing Provider  atorvastatin (LIPITOR) 40 MG tablet Take 1 tablet (40 mg total) by mouth daily. 12/06/15   Tommie Sams, DO  Blood Glucose Monitoring Suppl (RELION PRIME MONITOR) DEVI Use up to 4 times daily to check blood sugars. 12/06/15   Tommie Sams, DO  buPROPion (WELLBUTRIN SR) 150 MG 12 hr tablet 1 tablet daily for 3 days. Then increase to 1 tablet twice daily. 12/05/15   Tommie Sams, DO  carvedilol (COREG) 3.125 MG tablet Take 1 tablet (3.125 mg total) by mouth 2 (two) times daily  with a meal. 12/06/15   Tommie Sams, DO  clopidogrel (PLAVIX) 75 MG tablet Take 1 tablet (75 mg total) by mouth daily. Reported on 12/05/2015 12/06/15   Tommie Sams, DO  empagliflozin (JARDIANCE) 10 MG TABS tablet Take 10 mg by mouth daily. 12/08/15   Tommie Sams, DO  glucose blood (RELION PRIME TEST) test strip Use as instructed 12/06/15   Tommie Sams, DO  HYDROcodone-acetaminophen (NORCO/VICODIN) 5-325 MG tablet Take 1 tablet by mouth every 4 (four) hours as needed for moderate pain. 06/15/16   Tommi Rumps, PA-C  lisinopril (PRINIVIL,ZESTRIL) 5 MG tablet Take 1 tablet (5 mg total) by mouth daily. 12/06/15   Tommie Sams, DO  metFORMIN (GLUCOPHAGE) 500 MG tablet Take 1 tablet (500 mg total) by mouth 2 (two) times daily with a meal. 12/06/15   Tommie Sams, DO  ReliOn Ultra Thin Lancets MISC Use as directed to take blood sugar. 12/06/15   Tommie Sams, DO  sulfamethoxazole-trimethoprim (BACTRIM DS,SEPTRA DS) 800-160 MG tablet Take 1 tablet by mouth 2 (two) times daily. 06/15/16   Tommi Rumps, PA-C    Allergies Review of patient's allergies indicates no known allergies.  Family History  Problem Relation Age of Onset  . Diabetes Father   . Heart disease Father   . Breast cancer Mother   . Lung cancer Paternal Grandmother     Social History Social History  Substance Use Topics  . Smoking status: Current Every Day Smoker  Packs/day: 1.00    Types: Cigarettes  . Smokeless tobacco: Never Used  . Alcohol use 0.0 - 0.6 oz/week    Review of Systems Constitutional: No fever/chills Cardiovascular: Denies chest pain. Respiratory: Denies shortness of breath. Gastrointestinal:  No nausea, no vomiting.  Skin: Positive for abscess Neurological: Negative for  focal weakness or numbness.  10-point ROS otherwise negative.  ____________________________________________   PHYSICAL EXAM:  VITAL SIGNS: ED Triage Vitals  Enc Vitals Group     BP 06/15/16 1535 122/64     Pulse Rate  06/15/16 1727 72     Resp 06/15/16 1535 16     Temp 06/15/16 1535 98.2 F (36.8 C)     Temp Source 06/15/16 1535 Oral     SpO2 06/15/16 1535 95 %     Weight 06/15/16 1535 230 lb (104.3 kg)     Height 06/15/16 1535 6\' 2"  (1.88 m)     Head Circumference --      Peak Flow --      Pain Score 06/15/16 1535 2     Pain Loc --      Pain Edu? --      Excl. in GC? --     Constitutional: Alert and oriented. Well appearing and in no acute distress. Eyes: Conjunctivae are normal. PERRL. EOMI. Head: Atraumatic. Nose: No congestion/rhinnorhea. Neck: No stridor.   Cardiovascular: Normal rate, regular rhythm. Grossly normal heart sounds.  Good peripheral circulation. Respiratory: Normal respiratory effort.  No retractions. Lungs CTAB. Musculoskeletal: Moves upper and lower extremities without any difficulty. Normal gait was noted. Neurologic:  Normal speech and language. No gross focal neurologic deficits are appreciated. No gait instability. Skin:  Skin is warm, dry. There is an abscess present medial aspect of the upper arm. No active drainage was noted. Area is warm and tender to touch. There is no extending cellulitis present. Psychiatric: Mood and affect are normal. Speech and behavior are normal.  ____________________________________________   LABS (all labs ordered are listed, but only abnormal results are displayed)  Labs Reviewed - No data to display    PROCEDURES  Procedure(s) performed: INCISION AND DRAINAGE Performed by: Tommi Rumps Consent: Verbal consent obtained. Risks and benefits: risks, benefits and alternatives were discussed Type: abscess  Body area: Left upper arm  Anesthesia: local infiltration  Incision was made with a scalpel.  Local anesthetic: lidocaine 1 % without epinephrine  Anesthetic total: 3 ml  Complexity: complex Blunt dissection to break up loculations  Drainage: purulent  Drainage amount: Moderate   Packing material: 1/4 in  iodoform gauze  Patient tolerance: Patient tolerated the procedure well with no immediate complications.    Procedures  Critical Care performed: No  ____________________________________________   INITIAL IMPRESSION / ASSESSMENT AND PLAN / ED COURSE  Pertinent labs & imaging results that were available during my care of the patient were reviewed by me and considered in my medical decision making (see chart for details).    Clinical Course   Patient type rated procedure well. Patient was discharged with a prescription for Norco as needed for pain and Bactrim DS twice a day for 10 days. Patient is to follow-up with his primary care doctor or go to [facility] for packing removal in 2 days.  ____________________________________________   FINAL CLINICAL IMPRESSION(S) / ED DIAGNOSES  Final diagnoses:  Abscess of left arm      NEW MEDICATIONS STARTED DURING THIS VISIT:  Discharge Medication List as of 06/15/2016  5:20 PM  START taking these medications   Details  HYDROcodone-acetaminophen (NORCO/VICODIN) 5-325 MG tablet Take 1 tablet by mouth every 4 (four) hours as needed for moderate pain., Starting Sat 06/15/2016, Print    sulfamethoxazole-trimethoprim (BACTRIM DS,SEPTRA DS) 800-160 MG tablet Take 1 tablet by mouth 2 (two) times daily., Starting Sat 06/15/2016, Print         Note:  This document was prepared using Dragon voice recognition software and may include unintentional dictation errors.    Tommi Rumpshonda L Lania Zawistowski, PA-C 06/15/16 1853    Sharman CheekPhillip Stafford, MD 06/15/16 2031

## 2016-12-04 ENCOUNTER — Other Ambulatory Visit: Payer: Self-pay | Admitting: Family Medicine

## 2016-12-17 ENCOUNTER — Telehealth: Payer: Self-pay | Admitting: *Deleted

## 2016-12-17 ENCOUNTER — Other Ambulatory Visit: Payer: Self-pay | Admitting: Family Medicine

## 2016-12-17 NOTE — Telephone Encounter (Signed)
Unable to leave a voice mail.

## 2016-12-17 NOTE — Telephone Encounter (Signed)
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  Pt contact [phone]

## 2016-12-17 NOTE — Telephone Encounter (Signed)
Needs to be seen

## 2016-12-23 ENCOUNTER — Telehealth: Payer: Self-pay | Admitting: *Deleted

## 2016-12-23 NOTE — Telephone Encounter (Signed)
Open in error

## 2017-01-02 ENCOUNTER — Ambulatory Visit: Payer: Self-pay | Admitting: Family Medicine

## 2017-01-02 DIAGNOSIS — Z0289 Encounter for other administrative examinations: Secondary | ICD-10-CM

## 2017-01-25 ENCOUNTER — Emergency Department
Admission: EM | Admit: 2017-01-25 | Discharge: 2017-01-25 | Disposition: A | Discharge status: Home / Self Care | Payer: Self-pay | Attending: Emergency Medicine | Admitting: Emergency Medicine

## 2017-01-25 ENCOUNTER — Emergency Department: Payer: Self-pay

## 2017-01-25 ENCOUNTER — Encounter: Payer: Self-pay | Admitting: Emergency Medicine

## 2017-01-25 DIAGNOSIS — Z79899 Other long term (current) drug therapy: Secondary | ICD-10-CM | POA: Insufficient documentation

## 2017-01-25 DIAGNOSIS — I2510 Atherosclerotic heart disease of native coronary artery without angina pectoris: Secondary | ICD-10-CM | POA: Insufficient documentation

## 2017-01-25 DIAGNOSIS — E1159 Type 2 diabetes mellitus with other circulatory complications: Secondary | ICD-10-CM | POA: Insufficient documentation

## 2017-01-25 DIAGNOSIS — L02214 Cutaneous abscess of groin: Secondary | ICD-10-CM | POA: Insufficient documentation

## 2017-01-25 DIAGNOSIS — L0291 Cutaneous abscess, unspecified: Secondary | ICD-10-CM

## 2017-01-25 DIAGNOSIS — Z7984 Long term (current) use of oral hypoglycemic drugs: Secondary | ICD-10-CM | POA: Insufficient documentation

## 2017-01-25 DIAGNOSIS — I119 Hypertensive heart disease without heart failure: Secondary | ICD-10-CM | POA: Insufficient documentation

## 2017-01-25 DIAGNOSIS — F17210 Nicotine dependence, cigarettes, uncomplicated: Secondary | ICD-10-CM | POA: Insufficient documentation

## 2017-01-25 LAB — COMPREHENSIVE METABOLIC PANEL
ALT: 43 U/L (ref 17–63)
AST: 30 U/L (ref 15–41)
Albumin: 3.9 g/dL (ref 3.5–5.0)
Alkaline Phosphatase: 156 U/L — ABNORMAL HIGH (ref 38–126)
Anion gap: 9 (ref 5–15)
BUN: 11 mg/dL (ref 6–20)
CO2: 26 mmol/L (ref 22–32)
Calcium: 9.4 mg/dL (ref 8.9–10.3)
Chloride: 98 mmol/L — ABNORMAL LOW (ref 101–111)
Creatinine, Ser: 0.71 mg/dL (ref 0.61–1.24)
GFR calc Af Amer: >60 mL/min (ref >60)
GFR calc non Af Amer: >60 mL/min (ref >60)
Glucose, Bld: 323 mg/dL — ABNORMAL HIGH (ref 65–99)
Potassium: 3.8 mmol/L (ref 3.5–5.1)
Sodium: 133 mmol/L — ABNORMAL LOW (ref 135–145)
Total Bilirubin: 1 mg/dL (ref 0.3–1.2)
Total Protein: 8.3 g/dL — ABNORMAL HIGH (ref 6.5–8.1)

## 2017-01-25 LAB — CBC WITH DIFFERENTIAL/PLATELET
Basophils Absolute: 0.1 10*3/uL (ref 0–0.1)
Basophils Relative: 0 %
Eosinophils Absolute: 0.1 10*3/uL (ref 0–0.7)
Eosinophils Relative: 1 %
HCT: 48 % (ref 40.0–52.0)
Hemoglobin: 16.9 g/dL (ref 13.0–18.0)
Lymphocytes Relative: 15 %
Lymphs Abs: 2.3 10*3/uL (ref 1.0–3.6)
MCH: 32.7 pg (ref 26.0–34.0)
MCHC: 35.3 g/dL (ref 32.0–36.0)
MCV: 92.8 fL (ref 80.0–100.0)
Monocytes Absolute: 1 10*3/uL (ref 0.2–1.0)
Monocytes Relative: 7 %
Neutro Abs: 11.8 10*3/uL — ABNORMAL HIGH (ref 1.4–6.5)
Neutrophils Relative %: 77 %
Platelets: 201 10*3/uL (ref 150–440)
RBC: 5.18 MIL/uL (ref 4.40–5.90)
RDW: 12.6 % (ref 11.5–14.5)
WBC: 15.3 10*3/uL — ABNORMAL HIGH (ref 3.8–10.6)

## 2017-01-25 LAB — LACTIC ACID, PLASMA: Lactic Acid, Venous: 1 mmol/L (ref 0.5–1.9)

## 2017-01-25 MED ORDER — TRAMADOL HCL 50 MG PO TABS: 50.0000 mg | ORAL_TABLET | Freq: Four times a day (QID) | ORAL | 0 refills | Status: AC | PRN

## 2017-01-25 MED ORDER — TRAMADOL HCL 50 MG PO TABS
50.0000 mg | ORAL_TABLET | Freq: Once | ORAL | Status: AC
  Administered 2017-01-25: 50 mg via ORAL
  Filled 2017-01-25: qty 1

## 2017-01-25 MED ORDER — LIDOCAINE HCL 1 % IJ SOLN
5.0000 mL | Freq: Once | INTRAMUSCULAR | Status: DC
  Filled 2017-01-25: qty 5

## 2017-01-25 MED ORDER — IOPAMIDOL (ISOVUE-300) INJECTION 61%
100.0000 mL | Freq: Once | INTRAVENOUS | Status: AC | PRN
  Administered 2017-01-25: 100 mL via INTRAVENOUS
  Filled 2017-01-25: qty 100

## 2017-01-25 MED ORDER — LIDOCAINE HCL (PF) 1 % IJ SOLN
INTRAMUSCULAR | Status: AC
  Filled 2017-01-25: qty 5

## 2017-01-25 MED ORDER — NICOTINE 14 MG/24HR TD PT24
14.0000 mg | MEDICATED_PATCH | Freq: Once | TRANSDERMAL | Status: DC
  Administered 2017-01-25: 14 mg via TRANSDERMAL
  Filled 2017-01-25: qty 1

## 2017-01-25 MED ORDER — CLINDAMYCIN HCL 300 MG PO CAPS: 300.0000 mg | ORAL_CAPSULE | Freq: Three times a day (TID) | ORAL | 0 refills | Status: AC

## 2017-01-25 NOTE — ED Provider Notes (Signed)
[facility] Emergency Department Provider Note  ____________________________________________  Time seen: Approximately 6:58 PM  I have reviewed the triage vital signs and the nursing notes.   HISTORY  Chief Complaint Abscess    HPI [name] is a 46 y.o. male with a history of diabetes presents to the emergency department with a 3 cm x 3 cm region of palpable induration and fluctuance with 5 cm of surrounding cellulitis along the intertriginous fold of the left groin. Patient states that symptoms have occurred for the past 4 days and seemed to be progressively worsening. Patient denies fever and chills, nausea, vomiting and abdominal pain. Patient has a history of cutaneous abscesses. Patient states that bullae formation occurred yesterday. No alleviating measures have been attempted.   Past Medical History:  Diagnosis Date  . Diabetes mellitus without complication (HCC)   . Heart disease     Patient Active Problem List   Diagnosis Date Noted  . Coronary artery disease involving native coronary artery of native heart without angina pectoris 12/06/2015  . Uncontrolled type 2 diabetes mellitus with circulatory disorder, without long-term current use of insulin (HCC) 12/06/2015  . Preventative health care 12/06/2015  . Essential hypertension 12/06/2015  . Hyperlipidemia 12/06/2015  . Tobacco abuse 12/06/2015    Past Surgical History:  Procedure Laterality Date  . BACK SURGERY      Prior to Admission medications   Medication Sig Start Date End Date Taking? Authorizing Provider  atorvastatin (LIPITOR) 40 MG tablet Take 1 tablet (40 mg total) by mouth daily. 12/06/15   Tommie Sams, DO  Blood Glucose Monitoring Suppl (RELION PRIME MONITOR) DEVI Use up to 4 times daily to check blood sugars. 12/06/15   Tommie Sams, DO  buPROPion (WELLBUTRIN SR) 150 MG 12 hr tablet 1 tablet daily for 3 days. Then increase to 1 tablet twice daily. 12/05/15   Tommie Sams, DO  carvedilol (COREG) 3.125 MG tablet Take 1 tablet (3.125 mg total) by mouth 2 (two) times daily with a meal. 12/06/15   Cook, Verdis Frederickson, DO  clindamycin (CLEOCIN) 300 MG capsule Take 1 capsule (300 mg total) by mouth 3 (three) times daily. 01/25/17 02/04/17  Orvil Feil, PA-C  clopidogrel (PLAVIX) 75 MG tablet Take 1 tablet (75 mg total) by mouth daily. Reported on 12/05/2015 12/06/15   Tommie Sams, DO  empagliflozin (JARDIANCE) 10 MG TABS tablet Take 10 mg by mouth daily. 12/08/15   Tommie Sams, DO  glucose blood (RELION PRIME TEST) test strip Use as instructed 12/06/15   Tommie Sams, DO  lisinopril (PRINIVIL,ZESTRIL) 5 MG tablet Take 1 tablet (5 mg total) by mouth daily. 12/06/15   Tommie Sams, DO  metFORMIN (GLUCOPHAGE) 500 MG tablet Take 1 tablet (500 mg total) by mouth 2 (two) times daily with a meal. 12/06/15   Cook, Verdis Frederickson, DO  ReliOn Ultra Thin Lancets MISC Use as directed to take blood sugar. 12/06/15   Tommie Sams, DO  sulfamethoxazole-trimethoprim (BACTRIM DS,SEPTRA DS) 800-160 MG tablet Take 1 tablet by mouth 2 (two) times daily. 06/15/16   Tommi Rumps, PA-C  traMADol (ULTRAM) 50 MG tablet Take 1 tablet (50 mg total) by mouth every 6 (six) hours as needed. 01/25/17 01/28/17  Orvil Feil, PA-C    Allergies Patient has no known allergies.  Family History  Problem Relation Age of Onset  . Diabetes Father   . Heart disease Father   . Breast cancer  Mother   . Lung cancer Paternal Grandmother     Social History Social History  Substance Use Topics  . Smoking status: Current Every Day Smoker    Packs/day: 1.00    Types: Cigarettes  . Smokeless tobacco: Never Used  . Alcohol use 0.0 - 0.6 oz/week     Review of Systems  Constitutional: No fever/chills Eyes: No visual changes. No discharge ENT: No upper respiratory complaints. Cardiovascular: no chest pain. Respiratory: no cough. No SOB. Gastrointestinal: No abdominal pain.  No nausea, no vomiting.  No  diarrhea.  No constipation. Musculoskeletal: Negative for musculoskeletal pain. Skin:Patient has abscess at left intertriginous fold of left groin. Neurological: Negative for headaches, focal weakness or numbness.   ____________________________________________   PHYSICAL EXAM:  VITAL SIGNS: ED Triage Vitals [01/25/17 1704]  Enc Vitals Group     BP 134/83     Pulse Rate 99     Resp 18     Temp 98.9 F (37.2 C)     Temp Source Oral     SpO2 96 %     Weight 230 lb (104.3 kg)     Height 6\' 2"  (1.88 m)     Head Circumference      Peak Flow      Pain Score 10     Pain Loc      Pain Edu?      Excl. in GC?      Constitutional: Alert and oriented. Well appearing and in no acute distress. Eyes: Conjunctivae are normal. PERRL. EOMI. Head: Atraumatic. Cardiovascular: Normal rate, regular rhythm. Normal S1 and S2.  Good peripheral circulation. Respiratory: Normal respiratory effort without tachypnea or retractions. Lungs CTAB. Good air entry to the bases with no decreased or absent breath sounds. Gastrointestinal: Bowel sounds 4 quadrants. Soft and nontender to palpation. No guarding or rigidity. No palpable masses. No distention. No CVA tenderness. Musculoskeletal: Full range of motion to all extremities. No gross deformities appreciated. Neurologic:  Normal speech and language. No gross focal neurologic deficits are appreciated.  Skin: Patient has a 3 cm x 3 cm region of palpable induration and fluctuance with 5 cm of surrounding cellulitis along the intertriginous fold of the left groin.  Psychiatric: Mood and affect are normal. Speech and behavior are normal. Patient exhibits appropriate insight and judgement.   ____________________________________________   LABS (all labs ordered are listed, but only abnormal results are displayed)  Labs Reviewed  CBC WITH DIFFERENTIAL/PLATELET - Abnormal; Notable for the following:       Result Value   WBC 15.3 (*)    Neutro Abs 11.8  (*)    All other components within normal limits  COMPREHENSIVE METABOLIC PANEL - Abnormal; Notable for the following:    Sodium 133 (*)    Chloride 98 (*)    Glucose, Bld 323 (*)    Total Protein 8.3 (*)    Alkaline Phosphatase 156 (*)    All other components within normal limits  LACTIC ACID, PLASMA  LACTIC ACID, PLASMA   ____________________________________________  EKG   ____________________________________________  RADIOLOGY Geraldo Pitter, personally viewed and evaluated these images (plain radiographs) as part of my medical decision making, as well as reviewing the written report by the radiologist.    Ct Abdomen Pelvis W Contrast  Result Date: 01/25/2017 CLINICAL DATA:  46 year old male with history of left groin pain and redness progressively worsening over the past 4 days. EXAM: CT ABDOMEN AND PELVIS WITH CONTRAST TECHNIQUE: Multidetector CT imaging  of the abdomen and pelvis was performed using the standard protocol following bolus administration of intravenous contrast. CONTRAST:  ISOVUE-300 IOPAMIDOL (ISOVUE-300) INJECTION 61% COMPARISON:  No priors. FINDINGS: Lower chest: Unremarkable. Hepatobiliary: No cystic or solid hepatic lesions. No intra or extrahepatic biliary ductal dilatation. Gallbladder is normal in appearance. Pancreas: No pancreatic mass. No pancreatic ductal dilatation. No pancreatic or peripancreatic fluid or inflammatory changes. Spleen: Unremarkable. Adrenals/Urinary Tract: Multiple 2-3 mm nonobstructive calculi are present within the collecting systems the kidneys bilaterally (right greater than left). The appearance of the kidneys and bilateral adrenal glands is otherwise normal. There is no hydroureteronephrosis. Urinary bladder is normal in appearance. Stomach/Bowel: The appearance of the stomach is normal. There is no pathologic dilatation of small bowel or colon. Normal appendix. A few scattered colonic diverticulae are noted, and in the region  of the proximal sigmoid colon there are some subtle surrounding inflammatory changes concerning for an acute diverticulitis. No discrete diverticular abscess or signs of frank perforation are identified at this time. Vascular/Lymphatic: Atherosclerotic disease in the pelvic vasculature, without evidence of aneurysm or dissection in the abdominal or pelvic vasculature. Multiple prominent borderline enlarged left inguinal, left pelvic and retroperitoneal lymph nodes measuring up to 9 mm in short axis, presumably reactive. Reproductive: Prostate gland and seminal vesicles are unremarkable in appearance. Other: No significant volume of ascites.  No pneumoperitoneum. Musculoskeletal: In the left inguinal region there is a superficial low-attenuation lesion measuring 4.9 x 2.5 x 4.2 cm (axial image 81 of series 2 and sagittal image 105 of series 6). Which demonstrates peripheral enhancement and inflammatory changes in the adjacent fat, presumably a small abscess. IMPRESSION: 1. 4.9 x 2.5 x 4.2 cm low-attenuation rim enhancing structure in the subcutaneous fat of the left inguinal region is most compatible with a small abscess. Soft tissue stranding in the adjacent subcutaneous fat suggestive of an associated cellulitis. 2. In addition, there is colonic diverticulosis and in the mesocolic fat associated with proximal sigmoid colon there are inflammatory changes, concerning for acute diverticulitis. No diverticular abscess noted at this time. 3. Atherosclerosis. 4. Multiple 2-3 mm nonobstructive calculi are present within both renal collecting systems (right greater than left). No ureteral stones or findings of urinary tract obstruction are noted at this time. Electronically Signed   By: Trudie Reed M.D.   On: 01/25/2017 19:29    ____________________________________________    PROCEDURES  Procedure(s) performed:    Procedures  INCISION AND DRAINAGE Performed by: Orvil Feil Consent: Verbal consent  obtained. Risks and benefits: risks, benefits and alternatives were discussed Type: abscess  Body area: Left intertriginous fold of groin  Anesthesia: local infiltration  Incision was made with a scalpel.  Local anesthetic: lidocaine 1% without epinephrine  Anesthetic total: 3 ml  Complexity: complex Blunt dissection to break up loculations  Drainage: purulent  Drainage amount: 10 cc  Packing material: 1/4 in iodoform gauze  Patient tolerance: Patient tolerated the procedure well with no immediate complications.     Medications  nicotine (NICODERM CQ - dosed in mg/24 hours) patch 14 mg (14 mg Transdermal Patch Applied 01/25/17 1850)  lidocaine (XYLOCAINE) 1 % (with pres) injection 5 mL (not administered)  lidocaine (PF) (XYLOCAINE) 1 % injection (not administered)  traMADol (ULTRAM) tablet 50 mg (not administered)  iopamidol (ISOVUE-300) 61 % injection 100 mL (100 mLs Intravenous Contrast Given 01/25/17 1905)     ____________________________________________   INITIAL IMPRESSION / ASSESSMENT AND PLAN / ED COURSE  Pertinent labs & imaging results that  were available during my care of the patient were reviewed by me and considered in my medical decision making (see chart for details).  Review of the [place] CSRS was performed in accordance of the [facility] prior to dispensing any controlled drugs.     Assessment and plan: Abscess Patient presents to the emergency department with an abscess of the left intertriginous fold of groin. CT abdomen and pelvis confirmed abscess. Patient underwent incision and drainage in the emergency department without complication. He was discharged with clindamycin. Vital signs were reassuring prior to discharge.    ____________________________________________  FINAL CLINICAL IMPRESSION(S) / ED DIAGNOSES  Final diagnoses:  Abscess      NEW MEDICATIONS STARTED DURING THIS VISIT:  New Prescriptions   CLINDAMYCIN (CLEOCIN) 300 MG CAPSULE     Take 1 capsule (300 mg total) by mouth 3 (three) times daily.   TRAMADOL (ULTRAM) 50 MG TABLET    Take 1 tablet (50 mg total) by mouth every 6 (six) hours as needed.        This chart was dictated using voice recognition software/Dragon. Despite best efforts to proofread, errors can occur which can change the meaning. Any change was purely unintentional.    Orvil Feil, PA-C 01/25/17 2017    Phineas Semen, MD 01/25/17 2238

## 2017-01-25 NOTE — ED Notes (Signed)
4x4 dressing placed over abscess.

## 2017-01-25 NOTE — ED Triage Notes (Signed)
Abscess L groin 3 days.

## 2017-01-26 ENCOUNTER — Other Ambulatory Visit: Payer: Self-pay | Admitting: Family Medicine

## 2017-01-26 DIAGNOSIS — E1159 Type 2 diabetes mellitus with other circulatory complications: Secondary | ICD-10-CM

## 2017-01-26 DIAGNOSIS — IMO0002 Reserved for concepts with insufficient information to code with codable children: Secondary | ICD-10-CM

## 2017-01-26 DIAGNOSIS — E1165 Type 2 diabetes mellitus with hyperglycemia: Principal | ICD-10-CM

## 2017-01-27 NOTE — Telephone Encounter (Signed)
Appt is needed for further refills Last OV 12/05/2015

## 2017-12-29 ENCOUNTER — Ambulatory Visit: Payer: Self-pay | Admitting: Internal Medicine

## 2017-12-29 DIAGNOSIS — Z0289 Encounter for other administrative examinations: Secondary | ICD-10-CM

## 2020-03-29 ENCOUNTER — Ambulatory Visit: Payer: Self-pay | Admitting: Family Medicine

## 2020-09-27 ENCOUNTER — Emergency Department: Payer: Self-pay

## 2020-09-27 ENCOUNTER — Other Ambulatory Visit: Payer: Self-pay

## 2020-09-27 ENCOUNTER — Inpatient Hospital Stay
Admission: EM | Admit: 2020-09-27 | Discharge: 2020-10-14 | DRG: 854 | Disposition: A | Discharge status: Home Health | Payer: Self-pay | Attending: Internal Medicine | Admitting: Internal Medicine

## 2020-09-27 DIAGNOSIS — Z79899 Other long term (current) drug therapy: Secondary | ICD-10-CM

## 2020-09-27 DIAGNOSIS — L03119 Cellulitis of unspecified part of limb: Secondary | ICD-10-CM

## 2020-09-27 DIAGNOSIS — Z9111 Patient's noncompliance with dietary regimen: Secondary | ICD-10-CM

## 2020-09-27 DIAGNOSIS — I252 Old myocardial infarction: Secondary | ICD-10-CM

## 2020-09-27 DIAGNOSIS — E11621 Type 2 diabetes mellitus with foot ulcer: Secondary | ICD-10-CM | POA: Diagnosis present

## 2020-09-27 DIAGNOSIS — Z9119 Patient's noncompliance with other medical treatment and regimen: Secondary | ICD-10-CM

## 2020-09-27 DIAGNOSIS — A401 Sepsis due to streptococcus, group B: Principal | ICD-10-CM | POA: Diagnosis present

## 2020-09-27 DIAGNOSIS — E785 Hyperlipidemia, unspecified: Secondary | ICD-10-CM | POA: Diagnosis present

## 2020-09-27 DIAGNOSIS — Z20822 Contact with and (suspected) exposure to covid-19: Secondary | ICD-10-CM | POA: Diagnosis present

## 2020-09-27 DIAGNOSIS — Z8249 Family history of ischemic heart disease and other diseases of the circulatory system: Secondary | ICD-10-CM

## 2020-09-27 DIAGNOSIS — M869 Osteomyelitis, unspecified: Secondary | ICD-10-CM | POA: Diagnosis present

## 2020-09-27 DIAGNOSIS — K7460 Unspecified cirrhosis of liver: Secondary | ICD-10-CM | POA: Diagnosis present

## 2020-09-27 DIAGNOSIS — A419 Sepsis, unspecified organism: Secondary | ICD-10-CM | POA: Diagnosis present

## 2020-09-27 DIAGNOSIS — Z7289 Other problems related to lifestyle: Secondary | ICD-10-CM

## 2020-09-27 DIAGNOSIS — L03115 Cellulitis of right lower limb: Secondary | ICD-10-CM | POA: Diagnosis present

## 2020-09-27 DIAGNOSIS — R748 Abnormal levels of other serum enzymes: Secondary | ICD-10-CM

## 2020-09-27 DIAGNOSIS — Z803 Family history of malignant neoplasm of breast: Secondary | ICD-10-CM

## 2020-09-27 DIAGNOSIS — E119 Type 2 diabetes mellitus without complications: Secondary | ICD-10-CM

## 2020-09-27 DIAGNOSIS — S91301A Unspecified open wound, right foot, initial encounter: Secondary | ICD-10-CM | POA: Diagnosis present

## 2020-09-27 DIAGNOSIS — IMO0002 Reserved for concepts with insufficient information to code with codable children: Secondary | ICD-10-CM | POA: Diagnosis present

## 2020-09-27 DIAGNOSIS — G5603 Carpal tunnel syndrome, bilateral upper limbs: Secondary | ICD-10-CM | POA: Diagnosis present

## 2020-09-27 DIAGNOSIS — Z801 Family history of malignant neoplasm of trachea, bronchus and lung: Secondary | ICD-10-CM

## 2020-09-27 DIAGNOSIS — F17210 Nicotine dependence, cigarettes, uncomplicated: Secondary | ICD-10-CM | POA: Diagnosis present

## 2020-09-27 DIAGNOSIS — R7401 Elevation of levels of liver transaminase levels: Secondary | ICD-10-CM | POA: Diagnosis present

## 2020-09-27 DIAGNOSIS — Z7984 Long term (current) use of oral hypoglycemic drugs: Secondary | ICD-10-CM

## 2020-09-27 DIAGNOSIS — L089 Local infection of the skin and subcutaneous tissue, unspecified: Secondary | ICD-10-CM | POA: Diagnosis present

## 2020-09-27 DIAGNOSIS — R000 Tachycardia, unspecified: Secondary | ICD-10-CM | POA: Diagnosis present

## 2020-09-27 DIAGNOSIS — E1159 Type 2 diabetes mellitus with other circulatory complications: Secondary | ICD-10-CM | POA: Diagnosis present

## 2020-09-27 DIAGNOSIS — Z7902 Long term (current) use of antithrombotics/antiplatelets: Secondary | ICD-10-CM

## 2020-09-27 DIAGNOSIS — L97519 Non-pressure chronic ulcer of other part of right foot with unspecified severity: Secondary | ICD-10-CM | POA: Diagnosis present

## 2020-09-27 DIAGNOSIS — L02611 Cutaneous abscess of right foot: Secondary | ICD-10-CM | POA: Diagnosis present

## 2020-09-27 DIAGNOSIS — R252 Cramp and spasm: Secondary | ICD-10-CM | POA: Diagnosis not present

## 2020-09-27 DIAGNOSIS — Z597 Insufficient social insurance and welfare support: Secondary | ICD-10-CM

## 2020-09-27 DIAGNOSIS — Z23 Encounter for immunization: Secondary | ICD-10-CM

## 2020-09-27 DIAGNOSIS — I2510 Atherosclerotic heart disease of native coronary artery without angina pectoris: Secondary | ICD-10-CM | POA: Diagnosis present

## 2020-09-27 DIAGNOSIS — K766 Portal hypertension: Secondary | ICD-10-CM | POA: Diagnosis present

## 2020-09-27 DIAGNOSIS — I10 Essential (primary) hypertension: Secondary | ICD-10-CM | POA: Diagnosis present

## 2020-09-27 DIAGNOSIS — E1165 Type 2 diabetes mellitus with hyperglycemia: Secondary | ICD-10-CM | POA: Diagnosis present

## 2020-09-27 DIAGNOSIS — Z833 Family history of diabetes mellitus: Secondary | ICD-10-CM

## 2020-09-27 DIAGNOSIS — E1169 Type 2 diabetes mellitus with other specified complication: Secondary | ICD-10-CM | POA: Diagnosis present

## 2020-09-27 DIAGNOSIS — W000XXA Fall on same level due to ice and snow, initial encounter: Secondary | ICD-10-CM | POA: Diagnosis present

## 2020-09-27 DIAGNOSIS — R519 Headache, unspecified: Secondary | ICD-10-CM | POA: Diagnosis not present

## 2020-09-27 DIAGNOSIS — E1142 Type 2 diabetes mellitus with diabetic polyneuropathy: Secondary | ICD-10-CM | POA: Diagnosis present

## 2020-09-27 DIAGNOSIS — K8021 Calculus of gallbladder without cholecystitis with obstruction: Secondary | ICD-10-CM | POA: Diagnosis present

## 2020-09-27 DIAGNOSIS — E1152 Type 2 diabetes mellitus with diabetic peripheral angiopathy with gangrene: Secondary | ICD-10-CM | POA: Diagnosis present

## 2020-09-27 DIAGNOSIS — E11628 Type 2 diabetes mellitus with other skin complications: Secondary | ICD-10-CM | POA: Diagnosis present

## 2020-09-27 DIAGNOSIS — Z09 Encounter for follow-up examination after completed treatment for conditions other than malignant neoplasm: Secondary | ICD-10-CM

## 2020-09-27 HISTORY — DX: Acute myocardial infarction, unspecified: I21.9

## 2020-09-27 LAB — COMPREHENSIVE METABOLIC PANEL
ALT: 64 U/L — ABNORMAL HIGH (ref 0–44)
AST: 68 U/L — ABNORMAL HIGH (ref 15–41)
Albumin: 2.6 g/dL — ABNORMAL LOW (ref 3.5–5.0)
Alkaline Phosphatase: 236 U/L — ABNORMAL HIGH (ref 38–126)
Anion gap: 11 (ref 5–15)
BUN: 18 mg/dL (ref 6–20)
CO2: 25 mmol/L (ref 22–32)
Calcium: 9.1 mg/dL (ref 8.9–10.3)
Chloride: 96 mmol/L — ABNORMAL LOW (ref 98–111)
Creatinine, Ser: 0.98 mg/dL (ref 0.61–1.24)
GFR, Estimated: >60 mL/min (ref >60)
Glucose, Bld: 397 mg/dL — ABNORMAL HIGH (ref 70–99)
Potassium: 4.3 mmol/L (ref 3.5–5.1)
Sodium: 132 mmol/L — ABNORMAL LOW (ref 135–145)
Total Bilirubin: 2.4 mg/dL — ABNORMAL HIGH (ref 0.3–1.2)
Total Protein: 7.3 g/dL (ref 6.5–8.1)

## 2020-09-27 LAB — CBC WITH DIFFERENTIAL/PLATELET
Abs Immature Granulocytes: 0.14 10*3/uL — ABNORMAL HIGH (ref 0.00–0.07)
Basophils Absolute: 0 10*3/uL (ref 0.0–0.1)
Basophils Relative: 0 %
Eosinophils Absolute: 0.1 10*3/uL (ref 0.0–0.5)
Eosinophils Relative: 0 %
HCT: 40.2 % (ref 39.0–52.0)
Hemoglobin: 14.4 g/dL (ref 13.0–17.0)
Immature Granulocytes: 1 %
Lymphocytes Relative: 8 %
Lymphs Abs: 1.4 10*3/uL (ref 0.7–4.0)
MCH: 33 pg (ref 26.0–34.0)
MCHC: 35.8 g/dL (ref 30.0–36.0)
MCV: 92.2 fL (ref 80.0–100.0)
Monocytes Absolute: 1.1 10*3/uL — ABNORMAL HIGH (ref 0.1–1.0)
Monocytes Relative: 6 %
Neutro Abs: 16.1 10*3/uL — ABNORMAL HIGH (ref 1.7–7.7)
Neutrophils Relative %: 85 %
Platelets: 224 10*3/uL (ref 150–400)
RBC: 4.36 MIL/uL (ref 4.22–5.81)
RDW: 12.1 % (ref 11.5–15.5)
WBC: 18.9 10*3/uL — ABNORMAL HIGH (ref 4.0–10.5)
nRBC: 0 % (ref 0.0–0.2)

## 2020-09-27 LAB — RESP PANEL BY RT-PCR (FLU A&B, COVID) ARPGX2
Influenza A by PCR: NEGATIVE
Influenza B by PCR: NEGATIVE
SARS Coronavirus 2 by RT PCR: NEGATIVE

## 2020-09-27 LAB — LACTIC ACID, PLASMA: Lactic Acid, Venous: 1.4 mmol/L (ref 0.5–1.9)

## 2020-09-27 LAB — CBG MONITORING, ED: Glucose-Capillary: 380 mg/dL — ABNORMAL HIGH (ref 70–99)

## 2020-09-27 MED ORDER — HYDROCODONE-ACETAMINOPHEN 5-325 MG PO TABS
1.0000 | ORAL_TABLET | Freq: Four times a day (QID) | ORAL | Status: DC | PRN
  Administered 2020-09-28 (×2): 1 via ORAL
  Filled 2020-09-27 (×2): qty 1

## 2020-09-27 MED ORDER — VANCOMYCIN HCL IN DEXTROSE 1-5 GM/200ML-% IV SOLN
1000.0000 mg | Freq: Once | INTRAVENOUS | Status: AC
  Administered 2020-09-27: 1000 mg via INTRAVENOUS
  Filled 2020-09-27: qty 200

## 2020-09-27 MED ORDER — POLYETHYLENE GLYCOL 3350 17 G PO PACK
17.0000 g | PACK | Freq: Every day | ORAL | Status: DC | PRN
  Administered 2020-10-12 – 2020-10-14 (×2): 17 g via ORAL
  Filled 2020-09-27 (×2): qty 1

## 2020-09-27 MED ORDER — METRONIDAZOLE IN NACL 5-0.79 MG/ML-% IV SOLN
500.0000 mg | Freq: Once | INTRAVENOUS | Status: DC
  Filled 2020-09-27: qty 100

## 2020-09-27 MED ORDER — CARVEDILOL 3.125 MG PO TABS
3.1250 mg | ORAL_TABLET | Freq: Two times a day (BID) | ORAL | Status: DC
  Administered 2020-09-28 – 2020-10-14 (×31): 3.125 mg via ORAL
  Filled 2020-09-27 (×31): qty 1

## 2020-09-27 MED ORDER — INSULIN ASPART 100 UNIT/ML ~~LOC~~ SOLN
0.0000 [IU] | Freq: Every day | SUBCUTANEOUS | Status: DC
  Administered 2020-09-29 – 2020-10-04 (×2): 2 [IU] via SUBCUTANEOUS
  Administered 2020-10-10 – 2020-10-13 (×2): 3 [IU] via SUBCUTANEOUS
  Filled 2020-09-27 (×3): qty 1

## 2020-09-27 MED ORDER — INSULIN ASPART 100 UNIT/ML ~~LOC~~ SOLN
0.0000 [IU] | Freq: Three times a day (TID) | SUBCUTANEOUS | Status: DC
  Administered 2020-09-28: 3 [IU] via SUBCUTANEOUS
  Administered 2020-09-28 – 2020-09-29 (×2): 8 [IU] via SUBCUTANEOUS
  Administered 2020-09-30: 3 [IU] via SUBCUTANEOUS
  Administered 2020-09-30 (×2): 5 [IU] via SUBCUTANEOUS
  Administered 2020-10-01: 2 [IU] via SUBCUTANEOUS
  Administered 2020-10-01: 3 [IU] via SUBCUTANEOUS
  Administered 2020-10-02: 8 [IU] via SUBCUTANEOUS
  Administered 2020-10-02: 09:00:00 2 [IU] via SUBCUTANEOUS
  Administered 2020-10-03 (×2): 5 [IU] via SUBCUTANEOUS
  Administered 2020-10-04 (×2): 3 [IU] via SUBCUTANEOUS
  Administered 2020-10-05 (×2): 5 [IU] via SUBCUTANEOUS
  Administered 2020-10-05 – 2020-10-06 (×3): 3 [IU] via SUBCUTANEOUS
  Administered 2020-10-06: 08:00:00 5 [IU] via SUBCUTANEOUS
  Administered 2020-10-07 (×2): 3 [IU] via SUBCUTANEOUS
  Administered 2020-10-07: 13:00:00 5 [IU] via SUBCUTANEOUS
  Administered 2020-10-08: 17:00:00 2 [IU] via SUBCUTANEOUS
  Administered 2020-10-08 – 2020-10-10 (×7): 3 [IU] via SUBCUTANEOUS
  Administered 2020-10-10: 8 [IU] via SUBCUTANEOUS
  Administered 2020-10-11: 13:00:00 2 [IU] via SUBCUTANEOUS
  Administered 2020-10-11: 08:00:00 5 [IU] via SUBCUTANEOUS
  Administered 2020-10-12: 3 [IU] via SUBCUTANEOUS
  Administered 2020-10-12: 8 [IU] via SUBCUTANEOUS
  Administered 2020-10-12: 10:00:00 5 [IU] via SUBCUTANEOUS
  Administered 2020-10-13: 3 [IU] via SUBCUTANEOUS
  Administered 2020-10-13: 5 [IU] via SUBCUTANEOUS
  Administered 2020-10-13 – 2020-10-14 (×2): 3 [IU] via SUBCUTANEOUS
  Administered 2020-10-14: 2 [IU] via SUBCUTANEOUS
  Filled 2020-09-27 (×42): qty 1

## 2020-09-27 MED ORDER — SODIUM CHLORIDE 0.9 % IV BOLUS (SEPSIS)
1000.0000 mL | Freq: Once | INTRAVENOUS | Status: AC
  Administered 2020-09-27: 1000 mL via INTRAVENOUS

## 2020-09-27 MED ORDER — TETANUS-DIPHTH-ACELL PERTUSSIS 5-2.5-18.5 LF-MCG/0.5 IM SUSY
0.5000 mL | PREFILLED_SYRINGE | Freq: Once | INTRAMUSCULAR | Status: AC
  Administered 2020-09-28: 0.5 mL via INTRAMUSCULAR
  Filled 2020-09-27: qty 0.5

## 2020-09-27 MED ORDER — ACETAMINOPHEN 325 MG PO TABS
650.0000 mg | ORAL_TABLET | Freq: Four times a day (QID) | ORAL | Status: DC | PRN
  Administered 2020-09-28 – 2020-10-07 (×10): 650 mg via ORAL
  Filled 2020-09-27 (×11): qty 2

## 2020-09-27 MED ORDER — HYDROMORPHONE HCL 1 MG/ML IJ SOLN: 0.5000 mg | INTRAMUSCULAR | Status: DC | PRN

## 2020-09-27 MED ORDER — METRONIDAZOLE IN NACL 5-0.79 MG/ML-% IV SOLN
500.0000 mg | Freq: Three times a day (TID) | INTRAVENOUS | Status: DC
  Administered 2020-09-28 (×2): 500 mg via INTRAVENOUS
  Filled 2020-09-27 (×5): qty 100

## 2020-09-27 MED ORDER — SODIUM CHLORIDE 0.9% FLUSH
3.0000 mL | Freq: Two times a day (BID) | INTRAVENOUS | Status: DC
  Administered 2020-09-28 – 2020-10-14 (×20): 3 mL via INTRAVENOUS

## 2020-09-27 MED ORDER — ATORVASTATIN CALCIUM 20 MG PO TABS
40.0000 mg | ORAL_TABLET | Freq: Every day | ORAL | Status: DC
  Administered 2020-09-28 – 2020-10-14 (×14): 40 mg via ORAL
  Filled 2020-09-27 (×14): qty 2

## 2020-09-27 MED ORDER — HYDROMORPHONE HCL 1 MG/ML IJ SOLN
1.0000 mg | Freq: Once | INTRAMUSCULAR | Status: AC
Start: 2020-09-27 — End: 2020-09-27
  Administered 2020-09-27: 1 mg via INTRAVENOUS
  Filled 2020-09-27: qty 1

## 2020-09-27 MED ORDER — KETOROLAC TROMETHAMINE 30 MG/ML IJ SOLN
15.0000 mg | INTRAMUSCULAR | Status: AC
  Administered 2020-09-27: 15 mg via INTRAVENOUS
  Filled 2020-09-27: qty 1

## 2020-09-27 MED ORDER — ACETAMINOPHEN 650 MG RE SUPP: 650.0000 mg | Freq: Four times a day (QID) | RECTAL | Status: DC | PRN

## 2020-09-27 MED ORDER — SODIUM CHLORIDE 0.9 % IV SOLN
2.0000 g | Freq: Once | INTRAVENOUS | Status: AC
  Administered 2020-09-27: 2 g via INTRAVENOUS
  Filled 2020-09-27: qty 2

## 2020-09-27 MED ORDER — HEPARIN SODIUM (PORCINE) 5000 UNIT/ML IJ SOLN
5000.0000 [IU] | Freq: Three times a day (TID) | INTRAMUSCULAR | Status: DC
  Administered 2020-09-28 – 2020-10-10 (×34): 5000 [IU] via SUBCUTANEOUS
  Filled 2020-09-27 (×34): qty 1

## 2020-09-27 NOTE — ED Notes (Signed)
Blood cultures x2 collected -- Cefepime 2gm now infusing @200ml /hr with 1L NS bolus infusing @999ml /hr - pt sitting up to edge of bed eating sandwich meal; no acute changes.  Reporting pain improved to 3/10 after pain med administration-- denies any immediate needs at this time.

## 2020-09-27 NOTE — ED Notes (Signed)
Pt reports R foot edema noted shortly after falling from his truck 5 days pta -- pt states he cut his foot on metal step of truck (pt unable to recall last TdAP vaccine date) -- states nothing else noted until 1 day pta when erythema developed -- pt states over the course of 24 hours symptoms have worsened to include open draining ulcers primarily to R lateral foot regions.  Warm to touch; weak but pedal pulse palpable.  Pt denies loss of sensation and maintains ability to move toes well.  Foot is tender to touch.

## 2020-09-27 NOTE — ED Notes (Addendum)
Pt awake and alert- arrives from triage/waiting room via w/c.  Now lying in bed awaiting provider eval

## 2020-09-27 NOTE — H&P (Addendum)
History and Physical   [name] RCV:[id number] DOB: 06-28-1971 DOA: 09/27/2020  PCP: Coral Spikes, DO   Patient coming from: Home  Chief Complaint: Right foot wound  HPI: [name] is a 50 y.o. male with medical history significant of CAD, hypertension, hyperlipidemia, diabetes who presents with worsening right foot wound.  Patient states that 5 or 6 days ago he fell on getting out of his truck and cut his foot on a piece of metal (alluminum, not rusted) after slipping on some ice.  He states his foot was normal prior to this.  He has been staying at his home since the incident.  Following this fall and cut he did note some swelling of the foot but it was not until 2 days ago that his wound became worse and began to become red hot and developed ulcerations weeping some fluid. He reports fever and chills at home.  He states he took some Tylenol for his fever at home which helped.  Does not member when his last tetanus vaccine was.  Pain currently about 3 out of 10.  Denies chest pain, shortness of breath, abdominal pain, constipation, diarrhea, nausea, vomiting.  ED Course: Vital signs in the ED significant for blood pressure in the 102H to 852D systolic, heart rate initially in the 100s improved with 90s.  Saturating well on room air.  Lab work-up showed BMP with sodium of 132 which corrects to normal considering glucose of nearly 400., chloride 96, glucose 397.  LFTs with AST 68, ALT 64, alk phos 263, T bili 2.4.  CBC with leukocytosis to 18.9.  Lactic acid normal at 1.4.  Respiratory panel for flu and Covid if pending.  Urinalysis and blood cultures pending.  Patient given a liter of fluids and started on Vanco, Flagyl, cefepime in the ED.  Review of Systems: As per HPI otherwise all other systems reviewed and are negative.  Past Medical History:  Diagnosis Date  . Diabetes mellitus without complication ([place])   . Heart disease     Past Surgical History:  Procedure Laterality  Date  . BACK SURGERY      Social History  reports that he has been smoking cigarettes. He has been smoking about 1.00 pack per day. He has never used smokeless tobacco. He reports current alcohol use. He reports that he does not use drugs.  No Known Allergies  Family History  Problem Relation Age of Onset  . Diabetes Father   . Heart disease Father   . Breast cancer Mother   . Lung cancer Paternal Grandmother   Reviewed on Admission  Prior to Admission medications   Medication Sig Start Date End Date Taking? Authorizing Provider  atorvastatin (LIPITOR) 40 MG tablet Take 1 tablet (40 mg total) by mouth daily. 12/06/15   Coral Spikes, DO  Blood Glucose Monitoring Suppl (RELION PRIME MONITOR) DEVI Use up to 4 times daily to check blood sugars. 12/06/15   Coral Spikes, DO  buPROPion (WELLBUTRIN SR) 150 MG 12 hr tablet 1 tablet daily for 3 days. Then increase to 1 tablet twice daily. 12/05/15   Coral Spikes, DO  carvedilol (COREG) 3.125 MG tablet TAKE ONE TABLET BY MOUTH TWICE DAILY WITH A MEAL 01/27/17   Cook, [place] G, DO  clopidogrel (PLAVIX) 75 MG tablet TAKE ONE TABLET BY MOUTH ONCE DAILY 01/27/17   Thersa Salt G, DO  empagliflozin (JARDIANCE) 10 MG TABS tablet Take 10 mg by mouth daily. 12/08/15  Cook, [place] G, DO  glucose blood (RELION PRIME TEST) test strip Use as instructed 12/06/15   Thersa Salt G, DO  lisinopril (PRINIVIL,ZESTRIL) 5 MG tablet TAKE ONE TABLET BY MOUTH ONCE DAILY 01/27/17   Thersa Salt G, DO  metFORMIN (GLUCOPHAGE) 500 MG tablet TAKE ONE TABLET BY MOUTH TWICE DAILY WITH A MEAL 01/27/17   Cook, [place] G, DO  ReliOn Ultra Thin Lancets MISC Use as directed to take blood sugar. 12/06/15   Coral Spikes, DO  sulfamethoxazole-trimethoprim (BACTRIM DS,SEPTRA DS) 800-160 MG tablet Take 1 tablet by mouth 2 (two) times daily. 06/15/16   Johnn Hai, PA-C    Physical Exam: Vitals:   09/27/20 1901 09/27/20 1903 09/27/20 2041 09/27/20 2235  BP: (!) 142/80  (!) 148/83 128/66   Pulse: (!) 108  (!) 106 100  Resp: 18  20 20   Temp:  99 F (37.2 C) 99.5 F (37.5 C)   TempSrc:  Oral Oral   SpO2: 95%  93% 91%  Weight:      Height:       Physical Exam Constitutional:      General: He is not in acute distress.    Appearance: Normal appearance.  HENT:     Head: Normocephalic and atraumatic.     Mouth/Throat:     Mouth: Mucous membranes are moist.     Pharynx: Oropharynx is clear.  Eyes:     Extraocular Movements: Extraocular movements intact.     Pupils: Pupils are equal, round, and reactive to light.  Cardiovascular:     Rate and Rhythm: Normal rate and regular rhythm.     Pulses: Normal pulses.     Heart sounds: Normal heart sounds.  Pulmonary:     Effort: Pulmonary effort is normal. No respiratory distress.     Breath sounds: Normal breath sounds.  Abdominal:     General: Bowel sounds are normal. There is no distension.     Palpations: Abdomen is soft.     Tenderness: There is no abdominal tenderness.     Comments: Obese abdomen  Musculoskeletal:        General: Deformity and signs of injury present. No swelling.     Right lower leg: Edema present.     Comments: Right foot wound with ulcerations and erythema which spreads from his right fifth MTP up to the medial side to his foot and most of the way up his dorsal and plantar surface of his foot.  See attached photos.  Also noted is a blister on his right calf.  Right lower extremity is neurovascularly intact, weak pulses, able to move toes and sensation remains present as well.  Skin:    General: Skin is warm and dry.  Neurological:     General: No focal deficit present.     Mental Status: Mental status is at baseline.         Labs on Admission: I have personally reviewed following labs and imaging studies  CBC: Recent Labs  Lab 09/27/20 1902  WBC 18.9*  NEUTROABS 16.1*  HGB 14.4  HCT 40.2  MCV 92.2  PLT 366    Basic Metabolic Panel: Recent Labs  Lab 09/27/20 1902  NA 132*   K 4.3  CL 96*  CO2 25  GLUCOSE 397*  BUN 18  CREATININE 0.98  CALCIUM 9.1    GFR: Estimated Creatinine Clearance: 116.1 mL/min (by C-G formula based on SCr of 0.98 mg/dL).  Liver Function Tests: Recent Labs  Lab 09/27/20  1902  AST 68*  ALT 64*  ALKPHOS 236*  BILITOT 2.4*  PROT 7.3  ALBUMIN 2.6*    Urine analysis:    Component Value Date/Time   COLORURINE YELLOW 02/28/2010 2235   APPEARANCEUR CLEAR 02/28/2010 2235   LABSPEC 1.020 06/05/2015 1438   PHURINE 6.0 02/28/2010 2235   GLUCOSEU 500 (A) 06/05/2015 1438   HGBUR TRACE (A) 06/05/2015 1438   BILIRUBINUR Small 04/13/2015 1516   KETONESUR NEGATIVE 02/28/2010 2235   PROTEINUR 100 (A) 06/05/2015 1438   UROBILINOGEN 4.0 04/13/2015 1516   UROBILINOGEN 1.0 02/28/2010 2235   NITRITE Negative 04/13/2015 1516   NITRITE NEGATIVE 02/28/2010 2235   LEUKOCYTESUR Negative 04/13/2015 1516    Radiological Exams on Admission: DG Foot Complete Right  Result Date: 09/27/2020 CLINICAL DATA:  Fall EXAM: RIGHT FOOT COMPLETE - 3+ VIEW COMPARISON:  None. FINDINGS: Degenerative changes in the tarsal region. No acute bony abnormality. Specifically, no fracture, subluxation, or dislocation. Soft tissues are intact. IMPRESSION: No acute bony abnormality. Electronically Signed   By: Rolm Baptise M.D.   On: 09/27/2020 19:26   EKG: Not yet obtained  Assessment/Plan Principal Problem:   Diabetic foot infection ([place]) Active Problems:   Coronary artery disease involving native coronary artery of native heart without angina pectoris   Uncontrolled type 2 diabetes mellitus with circulatory disorder, without long-term current use of insulin ([place])   Essential hypertension   Hyperlipidemia   Transaminitis  Cellulitis Diabetes Diabetic foot infection > Patient with a history of diabetes appears to be currently poorly controlled versus elevated glucose from ongoing infection with glucose in the ED of 390s. > Sustained a foot wound 2 days ago  off a piece of metal from his truck.  Wound has been worsening since that time.  Now with weeping ulcers. > On oral diabetic medications at home, will hold these in favor of sliding scale insulin for improved control. > Does report fevers and chills at home.  Chills concerning for possible bacteremia developing. > He doesn't recall when he had his last Tdap however chart review shows that he had it in 2015, 7 years ago.  Tdap ordered in ED. - We'll need podiatry consult in the morning - Continue Vanco, cefepime, Flagyl - Monitor on telemetry - Follow-up urinalysis and blood cultures - Sliding scale insulin, will need improved glucose control for wound healing.  CAD Hypertension Hyperlipidemia - Holding lisinopril in the setting of normotensive and possible worsening infection with concern for bacteremia given reported chills. - Continue home Coreg, atorvastatin  Transaminitis > Unclear etiology at this time - We'll continue to monitor for now as we treat his acute infection  DVT prophylaxis: Heparin  Code Status:   Full  Family Communication:  None on admission Disposition Plan:   Patient is from:  Home  Anticipated DC to:  Home  Anticipated DC date:  1 to 5 days  Anticipated DC barriers: None  Consults called:  None, will need podiatry consult in the morning.   Admission status:  Observation, MedSurg with telemetry   Severity of Illness: The appropriate patient status for this patient is OBSERVATION. Observation status is judged to be reasonable and necessary in order to provide the required intensity of service to ensure the patient's safety. The patient's presenting symptoms, physical exam findings, and initial radiographic and laboratory data in the context of their medical condition is felt to place them at decreased risk for further clinical deterioration. Furthermore, it is anticipated that the patient will be medically  stable for discharge from the hospital within 2 midnights  of admission. The following factors support the patient status of observation.   " The patient's presenting symptoms include right foot wound with blistering. " The physical exam findings include right foot wound with blisters, erythema, edema. " The initial radiographic and laboratory data are concerning for glucose 397, WBC 18.9, transaminitis, sodium 132.   Marcelyn Bruins MD [facility]  How to contact the [facility] Attending or Consulting provider [place] or covering provider during after hours [place], for this patient?   1. Check the care team in [facility] and look for a) attending/consulting [facility] provider listed and b) the [facility] team listed 2. Log into [email or web address] and use [place]'s universal password to access. If you do not have the password, please contact the hospital operator. 3. Locate the [facility] provider you are looking for under [facility] and page to a number that you can be directly reached. 4. If you still have difficulty reaching the provider, please page the [facility] (Director on Call) for the Hospitalists listed on amion for assistance.  09/27/2020, 11:17 PM

## 2020-09-27 NOTE — ED Triage Notes (Addendum)
Pt comes EMS after falling off his truck a few days ago. Pt has been at home since. Foot is extremely swollen, broken skin (multiple large open ulcers), red, painful. Pt has had fevers and chills (had tylenol recently). CBG was 524 with EMS. NS going from EMS. Pt has 20G IV from EMS.

## 2020-09-27 NOTE — Progress Notes (Signed)
PHARMACY -  BRIEF ANTIBIOTIC NOTE   Pharmacy has received consult(s) for Cefepime and Vancomycin from an ED provider.  The patient's profile has been reviewed for ht/wt/allergies/indication/available labs.    One time order(s) placed for Cefepime 2g IV and Vancomycin 1g VI x 1 dose each.  Further antibiotics/pharmacy consults should be ordered by admitting physician if indicated.                       Thank you, Pearla Dubonnet 09/27/2020  9:44 PM

## 2020-09-27 NOTE — ED Provider Notes (Signed)
[facility] Emergency Department Provider Note  ____________________________________________  Time seen: Approximately 11:20 PM  I have reviewed the triage vital signs and the nursing notes.   HISTORY  Chief Complaint Hyperglycemia and Foot Pain    HPI [name] is a 50 y.o. male with a history of diabetes hypertension hyperlipidemia who reports being in his usual state of health until two or 3 days ago when he fell off his truck, scraping his right foot on the truck on the way down.   Since then he said gradual onset of pain redness swelling warmth of the right foot.  Symptoms are constant, worsening, no aggravating or alleviating factors.  Pain is nonradiating.     Past Medical History:  Diagnosis Date  . Diabetes mellitus without complication ([place])   . Heart disease      Patient Active Problem List   Diagnosis Date Noted  . Diabetic foot infection ([place]) 09/27/2020  . Transaminitis 09/27/2020  . Coronary artery disease involving native coronary artery of native heart without angina pectoris 12/06/2015  . Uncontrolled type 2 diabetes mellitus with circulatory disorder, without long-term current use of insulin ([place]) 12/06/2015  . Preventative health care 12/06/2015  . Essential hypertension 12/06/2015  . Hyperlipidemia 12/06/2015  . Tobacco abuse 12/06/2015     Past Surgical History:  Procedure Laterality Date  . BACK SURGERY       Prior to Admission medications   Medication Sig Start Date End Date Taking? Authorizing Provider  atorvastatin (LIPITOR) 40 MG tablet Take 1 tablet (40 mg total) by mouth daily. 12/06/15   Coral Spikes, DO  Blood Glucose Monitoring Suppl (RELION PRIME MONITOR) DEVI Use up to 4 times daily to check blood sugars. 12/06/15   Coral Spikes, DO  buPROPion (WELLBUTRIN SR) 150 MG 12 hr tablet 1 tablet daily for 3 days. Then increase to 1 tablet twice daily. 12/05/15   Coral Spikes, DO  carvedilol (COREG) 3.125 MG  tablet TAKE ONE TABLET BY MOUTH TWICE DAILY WITH A MEAL 01/27/17   Cook, [place] G, DO  clopidogrel (PLAVIX) 75 MG tablet TAKE ONE TABLET BY MOUTH ONCE DAILY 01/27/17   Thersa Salt G, DO  empagliflozin (JARDIANCE) 10 MG TABS tablet Take 10 mg by mouth daily. 12/08/15   Coral Spikes, DO  glucose blood (RELION PRIME TEST) test strip Use as instructed 12/06/15   Thersa Salt G, DO  lisinopril (PRINIVIL,ZESTRIL) 5 MG tablet TAKE ONE TABLET BY MOUTH ONCE DAILY 01/27/17   Thersa Salt G, DO  metFORMIN (GLUCOPHAGE) 500 MG tablet TAKE ONE TABLET BY MOUTH TWICE DAILY WITH A MEAL 01/27/17   Cook, [place] G, DO  ReliOn Ultra Thin Lancets MISC Use as directed to take blood sugar. 12/06/15   Coral Spikes, DO  sulfamethoxazole-trimethoprim (BACTRIM DS,SEPTRA DS) 800-160 MG tablet Take 1 tablet by mouth 2 (two) times daily. 06/15/16   Johnn Hai, PA-C     Allergies Patient has no known allergies.   Family History  Problem Relation Age of Onset  . Diabetes Father   . Heart disease Father   . Breast cancer Mother   . Lung cancer Paternal Grandmother     Social History Social History   Tobacco Use  . Smoking status: Current Every Day Smoker    Packs/day: 1.00    Types: Cigarettes  . Smokeless tobacco: Never Used  Substance Use Topics  . Alcohol use: Yes    Alcohol/week: 0.0 - 1.0 standard drinks  .  Drug use: No    Review of Systems  Constitutional:   No fever positive chills.  ENT:   No sore throat. No rhinorrhea. Cardiovascular:   No chest pain or syncope. Respiratory:   No dyspnea or cough. Gastrointestinal:   Negative for abdominal pain, vomiting and diarrhea.  Musculoskeletal: Positive right foot pain and swelling All other systems reviewed and are negative except as documented above in ROS and HPI.  ____________________________________________   PHYSICAL EXAM:  VITAL SIGNS: ED Triage Vitals  Enc Vitals Group     BP 09/27/20 1901 (!) 142/80     Pulse Rate 09/27/20 1901 (!) 108      Resp 09/27/20 1901 18     Temp 09/27/20 1903 99 F (37.2 C)     Temp Source 09/27/20 1903 Oral     SpO2 09/27/20 1901 95 %     Weight 09/27/20 1859 230 lb (104.3 kg)     Height 09/27/20 1859 6' 2"  (1.88 m)     Head Circumference --      Peak Flow --      Pain Score 09/27/20 1859 8     Pain Loc --      Pain Edu? --      Excl. in [place]? --     Vital signs reviewed, nursing assessments reviewed.   Constitutional:   Alert and oriented. Non-toxic appearance. Eyes:   Conjunctivae are normal. EOMI. PERRL. ENT      Head:   Normocephalic and atraumatic.      Nose:   Wearing a mask.      Mouth/Throat:   Wearing a mask.      Neck:   No meningismus. Full ROM. Hematological/Lymphatic/Immunilogical:   No cervical lymphadenopathy. Cardiovascular:   Tachycardia heart rate 110. Symmetric bilateral radial and DP pulses.  No murmurs. Cap refill less than 2 seconds. Respiratory:   Normal respiratory effort without tachypnea/retractions. Breath sounds are clear and equal bilaterally. No wheezes/rales/rhonchi. Gastrointestinal:   Soft and nontender. Non distended. There is no CVA tenderness.  No rebound, rigidity, or guarding.  Musculoskeletal:   Normal range of motion in all extremities.  Right foot is diffusely erythematous and tender and swollen.  Skin is warm to the touch.  There is purplish discoloration over half of the dorsal surface as well as an approximately 3 x 2 cm irregular area on the plantar midfoot.  There is maceration of the distal foot.  No palpable fluctuant fluid collection.  No crepitus.  No lymphangitic streaking. Neurologic:   Normal speech and language.  Motor grossly intact. No acute focal neurologic deficits are appreciated.  Skin:    Skin is warm, dry and intact.  Inflammatory changes of right foot as above.  No petechiae, purpura, or bullae.  ____________________________________________    LABS (pertinent positives/negatives) (all labs ordered are listed, but only  abnormal results are displayed) Labs Reviewed  COMPREHENSIVE METABOLIC PANEL - Abnormal; Notable for the following components:      Result Value   Sodium 132 (*)    Chloride 96 (*)    Glucose, Bld 397 (*)    Albumin 2.6 (*)    AST 68 (*)    ALT 64 (*)    Alkaline Phosphatase 236 (*)    Total Bilirubin 2.4 (*)    All other components within normal limits  CBC WITH DIFFERENTIAL/PLATELET - Abnormal; Notable for the following components:   WBC 18.9 (*)    Neutro Abs 16.1 (*)    Monocytes  Absolute 1.1 (*)    Abs Immature Granulocytes 0.14 (*)    All other components within normal limits  CBG MONITORING, ED - Abnormal; Notable for the following components:   Glucose-Capillary 380 (*)    All other components within normal limits  RESP PANEL BY RT-PCR (FLU A&B, COVID) ARPGX2  CULTURE, BLOOD (ROUTINE X 2)  CULTURE, BLOOD (ROUTINE X 2)  LACTIC ACID, PLASMA  URINALYSIS, COMPLETE (UACMP) WITH MICROSCOPIC  HIV ANTIBODY (ROUTINE TESTING W REFLEX)  CBC  COMPREHENSIVE METABOLIC PANEL   ____________________________________________   EKG    ____________________________________________    RADIOLOGY  DG Foot Complete Right  Result Date: 09/27/2020 CLINICAL DATA:  Fall EXAM: RIGHT FOOT COMPLETE - 3+ VIEW COMPARISON:  None. FINDINGS: Degenerative changes in the tarsal region. No acute bony abnormality. Specifically, no fracture, subluxation, or dislocation. Soft tissues are intact. IMPRESSION: No acute bony abnormality. Electronically Signed   By: Rolm Baptise M.D.   On: 09/27/2020 19:26    ____________________________________________   PROCEDURES .Critical Care Performed by: Carrie Mew, MD Authorized by: Carrie Mew, MD   Critical care provider statement:    Critical care time (minutes):  35   Critical care time was exclusive of:  Separately billable procedures and treating other patients   Critical care was necessary to treat or prevent imminent or life-threatening  deterioration of the following conditions:  Sepsis   Critical care was time spent personally by me on the following activities:  Development of treatment plan with patient or surrogate, discussions with consultants, evaluation of patient's response to treatment, examination of patient, obtaining history from patient or surrogate, ordering and performing treatments and interventions, ordering and review of laboratory studies, ordering and review of radiographic studies, pulse oximetry, re-evaluation of patient's condition and review of old charts    ____________________________________________    CLINICAL IMPRESSION / [place] / ED COURSE  Medications ordered in the ED: Medications  sodium chloride 0.9 % bolus 1,000 mL (1,000 mLs Intravenous New Bag/Given 09/27/20 2230)  metroNIDAZOLE (FLAGYL) IVPB 500 mg (has no administration in time range)  vancomycin (VANCOCIN) IVPB 1000 mg/200 mL premix (1,000 mg Intravenous New Bag/Given 09/27/20 2318)  atorvastatin (LIPITOR) tablet 40 mg (has no administration in time range)  carvedilol (COREG) tablet 3.125 mg (has no administration in time range)  heparin injection 5,000 Units (has no administration in time range)  sodium chloride flush (NS) 0.9 % injection 3 mL (has no administration in time range)  acetaminophen (TYLENOL) tablet 650 mg (has no administration in time range)    Or  acetaminophen (TYLENOL) suppository 650 mg (has no administration in time range)  polyethylene glycol (MIRALAX / GLYCOLAX) packet 17 g (has no administration in time range)  HYDROcodone-acetaminophen (NORCO/VICODIN) 5-325 MG per tablet 1 tablet (has no administration in time range)  HYDROmorphone (DILAUDID) injection 0.5 mg (has no administration in time range)  HYDROmorphone (DILAUDID) injection 1 mg (1 mg Intravenous Given 09/27/20 2141)  ceFEPIme (MAXIPIME) 2 g in sodium chloride 0.9 % 100 mL IVPB (0 g Intravenous Stopped 09/27/20 2310)  ketorolac (TORADOL) 30  MG/ML injection 15 mg (15 mg Intravenous Given 09/27/20 2141)    Pertinent labs & imaging results that were available during my care of the patient were reviewed by me and considered in my medical decision making (see chart for details).  KIONTE BAUMGARDNER was evaluated in Emergency Department on 09/27/2020 for the symptoms described in the history of present illness. He was evaluated in the context of the  global COVID-19 pandemic, which necessitated consideration that the patient might be at risk for infection with the SARS-CoV-2 virus that causes COVID-19. Institutional protocols and algorithms that pertain to the evaluation of patients at risk for COVID-19 are in a state of rapid change based on information released by regulatory bodies including the CDC and federal and state organizations. These policies and algorithms were followed during the patient's care in the ED.   Patient presents with clinically apparent cellulitis of right foot in the setting of diabetes.  He has elevated temperature, tachycardia, leukocytosis, consistent with sepsis.  Broad-spectrum antibiotics ordered with vancomycin, cefepime, Flagyl.  No evidence of osteomyelitis or necrotizing fasciitis at this time.  No signs of septic shock.  Case discussed with hospitalist for further management.      ____________________________________________   FINAL CLINICAL IMPRESSION(S) / ED DIAGNOSES    Final diagnoses:  Cellulitis in diabetic foot ([place])  Type 2 diabetes mellitus without complication, without long-term current use of insulin ([place])  Sepsis, due to unspecified organism, unspecified whether acute organ dysfunction present [facility])     ED Discharge Orders    None      Portions of this note were generated with dragon dictation software. Dictation errors may occur despite best attempts at proofreading.   Carrie Mew, MD 09/27/20 2324

## 2020-09-28 ENCOUNTER — Inpatient Hospital Stay: Payer: Self-pay

## 2020-09-28 ENCOUNTER — Encounter: Payer: Self-pay | Admitting: Internal Medicine

## 2020-09-28 DIAGNOSIS — I10 Essential (primary) hypertension: Secondary | ICD-10-CM

## 2020-09-28 DIAGNOSIS — E1165 Type 2 diabetes mellitus with hyperglycemia: Secondary | ICD-10-CM

## 2020-09-28 DIAGNOSIS — E119 Type 2 diabetes mellitus without complications: Secondary | ICD-10-CM

## 2020-09-28 DIAGNOSIS — E11628 Type 2 diabetes mellitus with other skin complications: Secondary | ICD-10-CM

## 2020-09-28 DIAGNOSIS — E1159 Type 2 diabetes mellitus with other circulatory complications: Secondary | ICD-10-CM

## 2020-09-28 DIAGNOSIS — E785 Hyperlipidemia, unspecified: Secondary | ICD-10-CM

## 2020-09-28 DIAGNOSIS — A419 Sepsis, unspecified organism: Secondary | ICD-10-CM

## 2020-09-28 DIAGNOSIS — L089 Local infection of the skin and subcutaneous tissue, unspecified: Secondary | ICD-10-CM

## 2020-09-28 DIAGNOSIS — I96 Gangrene, not elsewhere classified: Secondary | ICD-10-CM

## 2020-09-28 LAB — GLUCOSE, CAPILLARY
Glucose-Capillary: 444 mg/dL — ABNORMAL HIGH (ref 70–99)
Glucose-Capillary: 263 mg/dL — ABNORMAL HIGH (ref 70–99)
Glucose-Capillary: 160 mg/dL — ABNORMAL HIGH (ref 70–99)
Glucose-Capillary: 119 mg/dL — ABNORMAL HIGH (ref 70–99)

## 2020-09-28 LAB — CBC
HCT: 37.4 % — ABNORMAL LOW (ref 39.0–52.0)
Hemoglobin: 13 g/dL (ref 13.0–17.0)
MCH: 32.7 pg (ref 26.0–34.0)
MCHC: 34.8 g/dL (ref 30.0–36.0)
MCV: 94.2 fL (ref 80.0–100.0)
Platelets: 211 10*3/uL (ref 150–400)
RBC: 3.97 MIL/uL — ABNORMAL LOW (ref 4.22–5.81)
RDW: 12.2 % (ref 11.5–15.5)
WBC: 16.4 10*3/uL — ABNORMAL HIGH (ref 4.0–10.5)
nRBC: 0 % (ref 0.0–0.2)

## 2020-09-28 LAB — COMPREHENSIVE METABOLIC PANEL
ALT: 57 U/L — ABNORMAL HIGH (ref 0–44)
AST: 63 U/L — ABNORMAL HIGH (ref 15–41)
Albumin: 2.2 g/dL — ABNORMAL LOW (ref 3.5–5.0)
Alkaline Phosphatase: 203 U/L — ABNORMAL HIGH (ref 38–126)
Anion gap: 11 (ref 5–15)
BUN: 19 mg/dL (ref 6–20)
CO2: 25 mmol/L (ref 22–32)
Calcium: 8.7 mg/dL — ABNORMAL LOW (ref 8.9–10.3)
Chloride: 94 mmol/L — ABNORMAL LOW (ref 98–111)
Creatinine, Ser: 1.11 mg/dL (ref 0.61–1.24)
GFR, Estimated: >60 mL/min (ref >60)
Glucose, Bld: 432 mg/dL — ABNORMAL HIGH (ref 70–99)
Potassium: 3.8 mmol/L (ref 3.5–5.1)
Sodium: 130 mmol/L — ABNORMAL LOW (ref 135–145)
Total Bilirubin: 2.5 mg/dL — ABNORMAL HIGH (ref 0.3–1.2)
Total Protein: 6.4 g/dL — ABNORMAL LOW (ref 6.5–8.1)

## 2020-09-28 LAB — HEMOGLOBIN A1C
Hgb A1c MFr Bld: 11.2 % — ABNORMAL HIGH (ref 4.8–5.6)
Mean Plasma Glucose: 274.74 mg/dL

## 2020-09-28 LAB — HIV ANTIBODY (ROUTINE TESTING W REFLEX): HIV Screen 4th Generation wRfx: NONREACTIVE

## 2020-09-28 MED ORDER — INSULIN ASPART 100 UNIT/ML ~~LOC~~ SOLN
8.0000 [IU] | Freq: Three times a day (TID) | SUBCUTANEOUS | Status: DC
  Administered 2020-09-28 – 2020-10-01 (×8): 8 [IU] via SUBCUTANEOUS
  Filled 2020-09-28 (×9): qty 1

## 2020-09-28 MED ORDER — HYDROMORPHONE HCL 1 MG/ML IJ SOLN
1.0000 mg | INTRAMUSCULAR | Status: AC | PRN
  Administered 2020-09-28: 1 mg via INTRAVENOUS
  Filled 2020-09-28: qty 1

## 2020-09-28 MED ORDER — VANCOMYCIN HCL 1500 MG/300ML IV SOLN
1500.0000 mg | Freq: Two times a day (BID) | INTRAVENOUS | Status: DC
  Filled 2020-09-28 (×2): qty 300

## 2020-09-28 MED ORDER — HYDROCODONE-ACETAMINOPHEN 5-325 MG PO TABS
1.0000 | ORAL_TABLET | Freq: Four times a day (QID) | ORAL | Status: DC | PRN
  Administered 2020-09-28 – 2020-09-29 (×3): 1 via ORAL
  Administered 2020-09-30 (×2): 2 via ORAL
  Administered 2020-09-30: 1 via ORAL
  Administered 2020-10-01 – 2020-10-14 (×14): 2 via ORAL
  Filled 2020-09-28 (×5): qty 2
  Filled 2020-09-28: qty 1
  Filled 2020-09-28 (×7): qty 2
  Filled 2020-09-28: qty 1
  Filled 2020-09-28 (×2): qty 2
  Filled 2020-09-28: qty 1
  Filled 2020-09-28 (×3): qty 2

## 2020-09-28 MED ORDER — ASPIRIN EC 81 MG PO TBEC
81.0000 mg | DELAYED_RELEASE_TABLET | Freq: Every day | ORAL | Status: DC
  Administered 2020-09-28 – 2020-10-10 (×11): 81 mg via ORAL
  Filled 2020-09-28 (×11): qty 1

## 2020-09-28 MED ORDER — VANCOMYCIN HCL 1750 MG/350ML IV SOLN
1750.0000 mg | Freq: Two times a day (BID) | INTRAVENOUS | Status: DC
  Administered 2020-09-28: 1750 mg via INTRAVENOUS
  Filled 2020-09-28 (×2): qty 350

## 2020-09-28 MED ORDER — PIPERACILLIN-TAZOBACTAM 3.375 G IVPB
3.3750 g | Freq: Three times a day (TID) | INTRAVENOUS | Status: DC
  Administered 2020-09-28 – 2020-10-02 (×10): 3.375 g via INTRAVENOUS
  Filled 2020-09-28 (×17): qty 50

## 2020-09-28 MED ORDER — LINEZOLID 600 MG/300ML IV SOLN
600.0000 mg | Freq: Two times a day (BID) | INTRAVENOUS | Status: DC
  Administered 2020-09-28 – 2020-10-02 (×6): 600 mg via INTRAVENOUS
  Filled 2020-09-28 (×11): qty 300

## 2020-09-28 MED ORDER — ONDANSETRON HCL 4 MG/2ML IJ SOLN
4.0000 mg | Freq: Four times a day (QID) | INTRAMUSCULAR | Status: DC | PRN
  Administered 2020-09-28 – 2020-09-29 (×2): 4 mg via INTRAVENOUS
  Filled 2020-09-28 (×3): qty 2

## 2020-09-28 MED ORDER — INSULIN GLARGINE 100 UNIT/ML ~~LOC~~ SOLN
20.0000 [IU] | Freq: Every day | SUBCUTANEOUS | Status: DC
  Administered 2020-09-28: 20 [IU] via SUBCUTANEOUS
  Filled 2020-09-28 (×3): qty 0.2

## 2020-09-28 MED ORDER — INSULIN ASPART 100 UNIT/ML ~~LOC~~ SOLN
25.0000 [IU] | Freq: Once | SUBCUTANEOUS | Status: AC
  Administered 2020-09-28: 25 [IU] via SUBCUTANEOUS
  Filled 2020-09-28: qty 1

## 2020-09-28 MED ORDER — SODIUM CHLORIDE 0.9 % IV SOLN
2.0000 g | Freq: Three times a day (TID) | INTRAVENOUS | Status: DC
  Administered 2020-09-28: 2 g via INTRAVENOUS
  Filled 2020-09-28 (×5): qty 2

## 2020-09-28 NOTE — Progress Notes (Signed)
Inpatient Diabetes Program Recommendations  AACE/ADA: New Consensus Statement on Inpatient Glycemic Control   Target Ranges:  Prepandial:   less than 140 mg/dL      Peak postprandial:   less than 180 mg/dL (1-2 hours)      Critically ill patients:  140 - 180 mg/dL   Results for [name], [name] (MRN [id number]) as of 09/28/2020 08:29  Ref. Range 09/27/2020 19:09 09/28/2020 07:55  Glucose-Capillary Latest Ref Range: 70 - 99 mg/dL 380 (H) 444 (H)  Results for [name], [name] (MRN [id number]) as of 09/28/2020 08:29  Ref. Range 09/27/2020 19:02  Glucose Latest Ref Range: 70 - 99 mg/dL 397 (H)   Review of Glycemic Control  Diabetes history: DM2 Outpatient Diabetes medications: Metformin 1000 mg BID Current orders for Inpatient glycemic control: Novolog 0-15 units TID with meals, Novolog 0-5 units QHS  Inpatient Diabetes Program Recommendations:    Insulin: Please consider ordering Lantus 15 units Q24H (based on 104.3 kg x 0.15 units).  HbgA1C: Current A1C in process.  NOTE: In reviewing chart, noted patient has no insurance listed and Dr. Lacinda Axon listed as PCP. Initial glucose 397 mg/dl on 09/27/20. Noted one time order for Novolog 25 units (given at 8:35). Spoke with patient over the phone to inquire about DM medication and control. Patient states that he is seeing Frank Reaper, NP (at [facility]).  Patient reports that the only medication he takes for DM is Metformin BID (not sure of dose). Inquired about [name] (noted on home med list) and patient states he has never taken [place]. 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 Patient states that he checks glucose at home usually but notes he has not checked it in several days. Patient states that it is usually in the low 200's mg/dl in the mornings. Inquired about prior A1C and patient reports not being able to recall last A1C value. Informed patient that a current A1C is in process.  Discussed importance of checking CBGs and maintaining good CBG control to prevent long-term and short-term complications. Explained how hyperglycemia leads to damage within blood vessels which lead to the common complications seen with uncontrolled diabetes. Stressed to the patient the importance of improving glycemic control to prevent further complications from uncontrolled diabetes especially given foot infection. Informed patient that he will likely need to take additional DM medications for DM control especially given his initial glucose was 397 mg/dl and he has a foot infection so wound healing is imperative which is impacted by glucose control.  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 After talking further with patient, he states he would consider taking insulin short term if he absolutely has to but he would prefer to use additional oral DM medications.  Of note, since patient has no insurance, DM medications will need to be affordable. Informed patient that TOC would be consulted for assistance with medications if needed.   Patient verbalized understanding of information discussed and reports no further questions at this time related to diabetes.   Thanks, Barnie Alderman, RN, MSN, CDE Diabetes Coordinator Inpatient Diabetes Program [phone] (Team Pager from 8am to 5pm)

## 2020-09-28 NOTE — Progress Notes (Signed)
Pharmacy Antibiotic Note  [name] is a 50 y.o. male admitted on 09/27/2020 with worsening diabetic right foot wound.  Med hx:  DM, CAD, HTN, & HL.  Pharmacy has been consulted for Cefepime and Vancomycin dosing.  Pt is also ordered Flagyl.  Pt received initial doses of Cefepime and Vanc in ER.  Plan: Cefepime 2 gm q8hr based on indication and CrCl  Vancomycin 1750 mg IV Q 12 hrs.  Goal AUC 400-550. Expected AUC: 509.8 SCr used: 0.98  Continue to monitor for lab cx. Will order Vanc levels if needed and follow SCr and adjust abx dose if warranted.  Height: 6' 2"  (188 cm) Weight: 104.3 kg (230 lb) IBW/kg (Calculated) : 82.2  Temp (24hrs), Avg:98.9 F (37.2 C), Min:98.1 F (36.7 C), Max:99.5 F (37.5 C)  Recent Labs  Lab 09/27/20 1902  WBC 18.9*  CREATININE 0.98  LATICACIDVEN 1.4    Estimated Creatinine Clearance: 116.1 mL/min (by C-G formula based on SCr of 0.98 mg/dL).    No Known Allergies  Antimicrobials this admission: 02/09 Cefepime >>  02/09 Vancomycin >>  02/10 Flagyl >>  Microbiology results: 02/09 BCx: Pending  Thank you for allowing pharmacy to be a part of this patient's care.  Renda Rolls, PharmD, [facility] 09/28/2020 4:05 AM

## 2020-09-28 NOTE — Progress Notes (Signed)
Pharmacy Antibiotic Note  [name] is a 50 y.o. male admitted on 09/27/2020 with worsening diabetic right foot infection.  Med hx:  DM, CAD, HTN, & HL.  Pharmacy has been consulted for Cefepime and Vancomycin dosing.  Pt is also ordered Flagyl.  Pt received initial doses of Cefepime and Vanc in ER.  Plan: Continue Cefepime 2 gm q8hr based on indication and CrCl  Adjust Vancomycin to 1500 mg IV Q 12 hrs.  Goal AUC 400-550. Expected AUC: 491 SCr used: 1.11  Continue to monitor for lab cx and SCr. Pharmacy will  adjust abx dose if warranted.  Height: 6' 2"  (188 cm) Weight: 104.3 kg (230 lb) IBW/kg (Calculated) : 82.2  Temp (24hrs), Avg:98.8 F (37.1 C), Min:98.1 F (36.7 C), Max:99.5 F (37.5 C)  Recent Labs  Lab 09/27/20 1902 09/28/20 0516  WBC 18.9* 16.4*  CREATININE 0.98 1.11  LATICACIDVEN 1.4  --     Estimated Creatinine Clearance: 102.5 mL/min (by C-G formula based on SCr of 1.11 mg/dL).    No Known Allergies  Antimicrobials this admission: 02/09 Cefepime >>  02/09 Vancomycin >>  02/10 Flagyl >>  Microbiology results: 02/09 BCx: Pending  Thank you for allowing pharmacy to be a part of this patient's care.  Pernell Dupre, PharmD, BCPS Clinical Pharmacist 09/28/2020 9:17 AM

## 2020-09-28 NOTE — Anesthesia Preprocedure Evaluation (Addendum)
Anesthesia Evaluation  Patient identified by MRN, date of birth, ID band Patient awake    Reviewed: Allergy & Precautions, H&P , NPO status , Patient's Chart, lab work & pertinent test results  History of Anesthesia Complications Negative for: history of anesthetic complications  Airway Mallampati: III   Neck ROM: full  Mouth opening: Limited Mouth Opening Comment: Large beard  Dental  (+) Edentulous Upper, Edentulous Lower   Pulmonary neg sleep apnea, neg COPD, Current Smoker and Patient abstained from smoking.,    breath sounds clear to auscultation       Cardiovascular hypertension, (-) angina+ CAD and + Past MI  (-) Cardiac Stents (-) dysrhythmias  Rhythm:regular Rate:Normal     Neuro/Psych negative neurological ROS  negative psych ROS   GI/Hepatic negative GI ROS, Neg liver ROS,   Endo/Other  diabetes, Poorly Controlled  Renal/GU      Musculoskeletal   Abdominal   Peds  Hematology negative hematology ROS (+)   Anesthesia Other Findings Past Medical History: No date: Diabetes mellitus without complication ([place]) No date: Heart disease   Reproductive/Obstetrics negative OB ROS                           Anesthesia Physical Anesthesia Plan  ASA: III  Anesthesia Plan: General ETT   Post-op Pain Management:    Induction:   PONV Risk Score and Plan: Ondansetron, Dexamethasone, Midazolam and Treatment may vary due to age or medical condition  Airway Management Planned:   Additional Equipment:   Intra-op Plan:   Post-operative Plan:   Informed Consent: I have reviewed the patients History and Physical, chart, labs and discussed the procedure including the risks, benefits and alternatives for the proposed anesthesia with the patient or authorized representative who has indicated his/her understanding and acceptance.     Dental Advisory Given  Plan Discussed with:  Anesthesiologist, CRNA and Surgeon  Anesthesia Plan Comments:         Anesthesia Quick Evaluation

## 2020-09-28 NOTE — Plan of Care (Signed)
  Problem: Education: Goal: Knowledge of General Education information will improve Description: Including pain rating scale, medication(s)/side effects and non-pharmacologic comfort measures 09/28/2020 1844 by Ansel Bong, RN Outcome: Progressing 09/28/2020 1844 by Ansel Bong, RN Outcome: Progressing   Problem: Health Behavior/Discharge Planning: Goal: Ability to manage health-related needs will improve 09/28/2020 1844 by Ansel Bong, RN Outcome: Progressing 09/28/2020 1844 by Ansel Bong, RN Outcome: Progressing   Problem: Clinical Measurements: Goal: Ability to maintain clinical measurements within normal limits will improve 09/28/2020 1844 by Ansel Bong, RN Outcome: Progressing 09/28/2020 1844 by Ansel Bong, RN Outcome: Progressing Goal: Will remain free from infection 09/28/2020 1844 by Ansel Bong, RN Outcome: Progressing 09/28/2020 1844 by Ansel Bong, RN Outcome: Progressing Goal: Diagnostic test results will improve 09/28/2020 1844 by Ansel Bong, RN Outcome: Progressing 09/28/2020 1844 by Ansel Bong, RN Outcome: Progressing Goal: Respiratory complications will improve 09/28/2020 1844 by Ansel Bong, RN Outcome: Progressing 09/28/2020 1844 by Ansel Bong, RN Outcome: Progressing Goal: Cardiovascular complication will be avoided 09/28/2020 1844 by Ansel Bong, RN Outcome: Progressing 09/28/2020 1844 by Ansel Bong, RN Outcome: Progressing   Problem: Activity: Goal: Risk for activity intolerance will decrease 09/28/2020 1844 by Ansel Bong, RN Outcome: Progressing 09/28/2020 1844 by Ansel Bong, RN Outcome: Progressing   Problem: Nutrition: Goal: Adequate nutrition will be maintained 09/28/2020 1844 by Ansel Bong, RN Outcome: Progressing 09/28/2020 1844 by Ansel Bong, RN Outcome: Progressing   Problem: Coping: Goal: Level of anxiety will decrease 09/28/2020 1844 by Ansel Bong, RN Outcome:  Progressing 09/28/2020 1844 by Ansel Bong, RN Outcome: Progressing   Problem: Elimination: Goal: Will not experience complications related to bowel motility 09/28/2020 1844 by Ansel Bong, RN Outcome: Progressing 09/28/2020 1844 by Ansel Bong, RN Outcome: Progressing Goal: Will not experience complications related to urinary retention 09/28/2020 1844 by Ansel Bong, RN Outcome: Progressing 09/28/2020 1844 by Ansel Bong, RN Outcome: Progressing   Problem: Pain Managment: Goal: General experience of comfort will improve 09/28/2020 1844 by Ansel Bong, RN Outcome: Progressing 09/28/2020 1844 by Ansel Bong, RN Outcome: Progressing   Problem: Safety: Goal: Ability to remain free from injury will improve 09/28/2020 1844 by Ansel Bong, RN Outcome: Progressing 09/28/2020 1844 by Ansel Bong, RN Outcome: Progressing   Problem: Skin Integrity: Goal: Risk for impaired skin integrity will decrease 09/28/2020 1844 by Ansel Bong, RN Outcome: Progressing 09/28/2020 1844 by Ansel Bong, RN Outcome: Progressing

## 2020-09-28 NOTE — Plan of Care (Signed)
New care plan initiated 

## 2020-09-28 NOTE — Consult Note (Signed)
NAME: [name]  DOB: [birth year]  MRN: [id number]  Date/Time: 09/28/2020 8:24 PM  REQUESTING PROVIDER: Dr. Posey Pronto Subjective:  REASON FOR CONSULT: Foot infection ? [name] is a 50 y.o.-year-old male with a history of diabetes mellitus,Hypertension, hyperlipidemia, presented with painful , swollen and infected foot Pt is a long distance truck driver and was in [place] last Wednesday when he slipped on the truck step and fell scratching the foot on the steel steps. He wears flip flops while driving and the feet was not protected and got scratched. He put some peroxide and cleaned the wound- HE went back to [facility] over the weekend and when he returned to [place] he found the foot getting bigger, red and change in color . Which prompted him to seek help. HE lives on his own, says he has no family, smoker In the  ED BP 140/80 Heart rate 108, temperature 99, sats 95%, labs revealed a creatinine of 0.98, blood sugar of 397, sodium of 132, potassium of 4.3, alkaline phosphatase of 236, AST 68, ALT 64, WBC 18.9, Hb 14.4, platelet 224, blood culture was sent and he was started on Vanco cefepime and Flagyl.  X-ray of the foot did not show any acute findings.  He was seen by Dr. Vickki Muff and MRI has been ordered. I am seeing the patient for the foot infection. Past Medical History:  Diagnosis Date  . Diabetes mellitus without complication ([place])   . Heart disease     Past Surgical History:  Procedure Laterality Date  . BACK SURGERY      Social History   Socioeconomic History  . Marital status: Single    Spouse name: Not on file  . Number of children: Not on file  . Years of education: Not on file  . Highest education level: Not on file  Occupational History  . Not on file  Tobacco Use  . Smoking status: Current Every Day Smoker    Packs/day: 1.00    Types: Cigarettes  . Smokeless tobacco: Never Used  Substance and Sexual Activity  . Alcohol use: Yes    Alcohol/week: 0.0 - 1.0 standard drinks  .  Drug use: No  . Sexual activity: Not on file  Other Topics Concern  . Not on file  Social History Narrative  . Not on file   Social Determinants of Health   Financial Resource Strain: Not on file  Food Insecurity: Not on file  Transportation Needs: Not on file  Physical Activity: Not on file  Stress: Not on file  Social Connections: Not on file  Intimate Partner Violence: Not on file    Family History  Problem Relation Age of Onset  . Diabetes Father   . Heart disease Father   . Breast cancer Mother   . Lung cancer Paternal Grandmother    No Known Allergies ? Current Facility-Administered Medications  Medication Dose Route Frequency Provider Last Rate Last Admin  . acetaminophen (TYLENOL) tablet 650 mg  650 mg Oral Q6H PRN Marcelyn Bruins, MD   650 mg at 09/28/20 0001   Or  . acetaminophen (TYLENOL) suppository 650 mg  650 mg Rectal Q6H PRN Marcelyn Bruins, MD      . aspirin EC tablet 81 mg  81 mg Oral Daily Fritzi Mandes, MD   81 mg at 09/28/20 1230  . atorvastatin (LIPITOR) tablet 40 mg  40 mg Oral Daily Marcelyn Bruins, MD   40 mg at 09/28/20 0263  . carvedilol (  COREG) tablet 3.125 mg  3.125 mg Oral BID WC Marcelyn Bruins, MD   3.125 mg at 09/28/20 1617  . heparin injection 5,000 Units  5,000 Units Subcutaneous Q8H Marcelyn Bruins, MD   5,000 Units at 09/28/20 1614  . HYDROcodone-acetaminophen (NORCO/VICODIN) 5-325 MG per tablet 1-2 tablet  1-2 tablet Oral Q6H PRN Fritzi Mandes, MD      . insulin aspart (novoLOG) injection 0-15 Units  0-15 Units Subcutaneous TID WC Marcelyn Bruins, MD   3 Units at 09/28/20 1808  . insulin aspart (novoLOG) injection 0-5 Units  0-5 Units Subcutaneous QHS Marcelyn Bruins, MD      . insulin aspart (novoLOG) injection 8 Units  8 Units Subcutaneous TID WC Fritzi Mandes, MD   8 Units at 09/28/20 1809  . insulin glargine (LANTUS) injection 20 Units  20 Units Subcutaneous Daily Fritzi Mandes, MD   20 Units at 09/28/20 1225  .  linezolid (ZYVOX) IVPB 600 mg  600 mg Intravenous Q12H Larose Batres, Joellyn Quails, MD      . ondansetron (ZOFRAN) injection 4 mg  4 mg Intravenous Q6H PRN Fritzi Mandes, MD   4 mg at 09/28/20 1122  . piperacillin-tazobactam (ZOSYN) IVPB 3.375 g  3.375 g Intravenous Q8H Ynez Eugenio, Joellyn Quails, MD 12.5 mL/hr at 09/28/20 1808 3.375 g at 09/28/20 1808  . polyethylene glycol (MIRALAX / GLYCOLAX) packet 17 g  17 g Oral Daily PRN Marcelyn Bruins, MD      . sodium chloride flush (NS) 0.9 % injection 3 mL  3 mL Intravenous Q12H Marcelyn Bruins, MD   3 mL at 09/28/20 9935     Abtx:  Anti-infectives (From admission, onward)   Start     Dose/Rate Route Frequency Ordered Stop   09/28/20 1800  vancomycin (VANCOREADY) IVPB 1500 mg/300 mL  Status:  Discontinued        1,500 mg 150 mL/hr over 120 Minutes Intravenous Every 12 hours 09/28/20 0915 09/28/20 1553   09/28/20 1800  linezolid (ZYVOX) IVPB 600 mg        600 mg 300 mL/hr over 60 Minutes Intravenous Every 12 hours 09/28/20 1553     09/28/20 1700  piperacillin-tazobactam (ZOSYN) IVPB 3.375 g        3.375 g 12.5 mL/hr over 240 Minutes Intravenous Every 8 hours 09/28/20 1553     09/28/20 0600  ceFEPIme (MAXIPIME) 2 g in sodium chloride 0.9 % 100 mL IVPB  Status:  Discontinued        2 g 200 mL/hr over 30 Minutes Intravenous Every 8 hours 09/28/20 0216 09/28/20 1553   09/28/20 0500  vancomycin (VANCOREADY) IVPB 1750 mg/350 mL  Status:  Discontinued        1,750 mg 175 mL/hr over 120 Minutes Intravenous Every 12 hours 09/28/20 0222 09/28/20 0915   09/28/20 0200  metroNIDAZOLE (FLAGYL) IVPB 500 mg  Status:  Discontinued        500 mg 100 mL/hr over 60 Minutes Intravenous Every 8 hours 09/27/20 2320 09/28/20 1552   09/27/20 2130  ceFEPIme (MAXIPIME) 2 g in sodium chloride 0.9 % 100 mL IVPB        2 g 200 mL/hr over 30 Minutes Intravenous  Once 09/27/20 2120 09/27/20 2310   09/27/20 2130  metroNIDAZOLE (FLAGYL) IVPB 500 mg  Status:  Discontinued         500 mg 100 mL/hr over 60 Minutes Intravenous  Once 09/27/20 2120 09/28/20 0122   09/27/20 2130  vancomycin (VANCOCIN) IVPB 1000  mg/200 mL premix        1,000 mg 200 mL/hr over 60 Minutes Intravenous  Once 09/27/20 2120 09/28/20 0106      REVIEW OF SYSTEMS:  Const:  fever, negative chills, negative weight loss Eyes: negative diplopia or visual changes, negative eye pain ENT: negative coryza, negative sore throat Resp: negative cough, hemoptysis, dyspnea Cards: negative for chest pain, palpitations, lower extremity edema GU: negative for frequency, dysuria and hematuria GI: Negative for abdominal pain, diarrhea, bleeding, constipation Skin:as above Heme: negative for easy bruising and gum/nose bleeding MS: weakness Neurolo:+ headaches, dizziness, vertigo, memory problems  Psych: negative for feelings of anxiety, depression  Endocrine:poorly controlled DM Allergy/Immunology- negative for any medication or food allergies ?  Objective:  VITALS:  BP 132/76 (BP Location: Left Arm)   Pulse 94   Temp 99.6 F (37.6 C)   Resp 18   Ht 6' 2"  (1.88 m)   Wt 104.3 kg   SpO2 94%   BMI 29.53 kg/m  PHYSICAL EXAM:  General: Alert, cooperative, some distress,pale, ill Head: Normocephalic, without obvious abnormality, atraumatic. Eyes: Conjunctivae clear, anicteric sclerae. Pupils are equal ENT Nares normal. No drainage or sinus tenderness. Lips, mucosa, and tongue normal. No Thrush edentulous Neck: Supple, symmetrical, no adenopathy, thyroid: non tender no carotid bruit and no JVD. Back: No CVA tenderness. Lungs: Clear to auscultation bilaterally. No Wheezing or Rhonchi. No rales. Heart: Regular rate and rhythm, no murmur, rub or gallop. Abdomen: Soft, non-tender,not distended. Bowel sounds normal. No masses Extremities:        Lymph: Cervical, supraclavicular normal. Neurologic: Grossly non-focal Pertinent Labs Lab Results CBC    Component Value Date/Time   WBC 16.4 (H)  09/28/2020 0516   RBC 3.97 (L) 09/28/2020 0516   HGB 13.0 09/28/2020 0516   HCT 37.4 (L) 09/28/2020 0516   PLT 211 09/28/2020 0516   MCV 94.2 09/28/2020 0516   MCH 32.7 09/28/2020 0516   MCHC 34.8 09/28/2020 0516   RDW 12.2 09/28/2020 0516   LYMPHSABS 1.4 09/27/2020 1902   MONOABS 1.1 (H) 09/27/2020 1902   EOSABS 0.1 09/27/2020 1902   BASOSABS 0.0 09/27/2020 1902    CMP Latest Ref Rng & Units 09/28/2020 09/27/2020 01/25/2017  Glucose 70 - 99 mg/dL 432(H) 397(H) 323(H)  BUN 6 - 20 mg/dL 19 18 11   Creatinine 0.61 - 1.24 mg/dL 1.11 0.98 0.71  Sodium 135 - 145 mmol/L 130(L) 132(L) 133(L)  Potassium 3.5 - 5.1 mmol/L 3.8 4.3 3.8  Chloride 98 - 111 mmol/L 94(L) 96(L) 98(L)  CO2 22 - 32 mmol/L 25 25 26   Calcium 8.9 - 10.3 mg/dL 8.7(L) 9.1 9.4  Total Protein 6.5 - 8.1 g/dL 6.4(L) 7.3 8.3(H)  Total Bilirubin 0.3 - 1.2 mg/dL 2.5(H) 2.4(H) 1.0  Alkaline Phos 38 - 126 U/L 203(H) 236(H) 156(H)  AST 15 - 41 U/L 63(H) 68(H) 30  ALT 0 - 44 U/L 57(H) 64(H) 43      Microbiology: Recent Results (from the past 240 hour(s))  Culture, blood (routine x 2)     Status: None (Preliminary result)   Collection Time: 09/27/20  9:48 PM   Specimen: BLOOD  Result Value Ref Range Status   Specimen Description BLOOD RIGHT ANTECUBITAL  Final   Special Requests   Final    BOTTLES DRAWN AEROBIC AND ANAEROBIC Blood Culture adequate volume   Culture   Final    NO GROWTH < 12 HOURS Performed at [facility], [address], [place], [place] [address]  Report Status PENDING  Incomplete  Resp Panel by RT-PCR (Flu A&B, Covid) Nasopharyngeal Swab     Status: None   Collection Time: 09/27/20  9:48 PM   Specimen: Nasopharyngeal Swab; Nasopharyngeal(NP) swabs in vial transport medium  Result Value Ref Range Status   SARS Coronavirus 2 by RT PCR NEGATIVE NEGATIVE Final    Comment: (NOTE) SARS-CoV-2 target nucleic acids are NOT DETECTED.  The SARS-CoV-2 RNA is generally detectable in upper  respiratory specimens during the acute phase of infection. The lowest concentration of SARS-CoV-2 viral copies this assay can detect is 138 copies/mL. A negative result does not preclude SARS-Cov-2 infection and should not be used as the sole basis for treatment or other patient management decisions. A negative result may occur with  improper specimen collection/handling, submission of specimen other than nasopharyngeal swab, presence of viral mutation(s) within the areas targeted by this assay, and inadequate number of viral copies(<138 copies/mL). A negative result must be combined with clinical observations, patient history, and epidemiological information. The expected result is Negative.  Fact Sheet for Patients:  EntrepreneurPulse.com.au  Fact Sheet for Healthcare Providers:  IncredibleEmployment.be  This test is no t yet approved or cleared by the [place] FDA and  has been authorized for detection and/or diagnosis of SARS-CoV-2 by FDA under an Emergency Use Authorization (EUA). This EUA will remain  in effect (meaning this test can be used) for the duration of the COVID-19 declaration under Section 564(b)(1) of the Act, 21 U.S.C.section 360bbb-3(b)(1), unless the authorization is terminated  or revoked sooner.       Influenza A by PCR NEGATIVE NEGATIVE Final   Influenza B by PCR NEGATIVE NEGATIVE Final    Comment: (NOTE) The Xpert Xpress SARS-CoV-2/FLU/RSV plus assay is intended as an aid in the diagnosis of influenza from Nasopharyngeal swab specimens and should not be used as a sole basis for treatment. Nasal washings and aspirates are unacceptable for Xpert Xpress SARS-CoV-2/FLU/RSV testing.  Fact Sheet for Patients: EntrepreneurPulse.com.au  Fact Sheet for Healthcare Providers: IncredibleEmployment.be  This test is not yet approved or cleared by the [place] FDA and has been  authorized for detection and/or diagnosis of SARS-CoV-2 by FDA under an Emergency Use Authorization (EUA). This EUA will remain in effect (meaning this test can be used) for the duration of the COVID-19 declaration under Section 564(b)(1) of the Act, 21 U.S.C. section 360bbb-3(b)(1), unless the authorization is terminated or revoked.  Performed at [facility], [address] [place], [place] [address]   Culture, blood (routine x 2)     Status: None (Preliminary result)   Collection Time: 09/27/20 10:31 PM   Specimen: BLOOD  Result Value Ref Range Status   Specimen Description BLOOD LEFT ANTECUBITAL  Final   Special Requests   Final    BOTTLES DRAWN AEROBIC AND ANAEROBIC Blood Culture adequate volume   Culture   Final    NO GROWTH < 12 HOURS Performed at [facility], [address] [place], [place] [address]    Report Status PENDING  Incomplete    IMAGING RESULTS: Xray foot Degenerative changes in the tarsal region. No acute bony abnormality. Specifically, no fracture, subluxation, or dislocation. Soft tissues are intact. I have personally reviewed the films ? Impression/Recommendation Diabetes mellitus with severe right foot infection secondary to trauma. The foot has necrosis and some gangrenous changes Currently on vancomycin, cefepime and Flagyl will change that to Zosyn and linezolid.  This will give  better coverage for gram-negative's, anaerobes,  MRSA and also antitoxin effect from linezolid. Seen by Dr. Vickki Muff and MRI has been ordered.  Planning for surgery after that.  Diabetes mellitus poorly controlled hemoglobin A1c is 11.2.  Hypertension: on Coreg Hyperlipidemia on atorvastatin ? ___________________________________________________ Discussed with patient,and his nurse Note:  This document was prepared using Dragon voice recognition software and may include unintentional dictation errors.

## 2020-09-28 NOTE — TOC Initial Note (Addendum)
Transition of Care (TOC) - Initial/Assessment Note    Patient Details  Name: [name] MRN: [id number] Date of Birth: 06/16/1971  Transition of Care (TOC) CM/SW Contact:    Meagan E Hagwood, LCSW Phone Number: 09/28/2020, 11:56 AM  Clinical Narrative:              CSW met with patient at bedside. Patient is uninsured. 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 Patient is current with [facility] for PCP services. Patient reported he does have issues obtaining medicatoins at times. Patient agreeable with referral to [facility]. Provided application. Updated home pharmacy in chart.     Expected Discharge Plan: Home/Self Care Barriers to Discharge: Continued Medical Work up   Patient Goals and CMS Choice Patient states their goals for this hospitalization and ongoing recovery are:: home with self care CMS Medicare.gov Compare Post Acute Care list provided to:: Patient Choice offered to / list presented to : Patient  Expected Discharge Plan and Services Expected Discharge Plan: Home/Self Care       Living arrangements for the past 2 months: Single Family Home                                      Prior Living Arrangements/Services Living arrangements for the past 2 months: Single Family Home Lives with:: Self Patient language and need for interpreter reviewed:: Yes Do you feel safe going back to the place where you live?: Yes      Need for Family Participation in Patient Care: Yes (Comment) Care giver support system in place?: Yes (comment)   Criminal Activity/Legal Involvement Pertinent to Current Situation/Hospitalization: No - Comment as needed  Activities of Daily Living Home Assistive Devices/Equipment: Cane (specify quad or straight) ADL Screening (condition at time of admission) Patient's cognitive ability adequate to safely complete daily activities?: Yes Is the patient deaf or have difficulty hearing?: No Does the  patient have difficulty seeing, even when wearing glasses/contacts?: No Does the patient have difficulty concentrating, remembering, or making decisions?: No Patient able to express need for assistance with ADLs?: Yes Does the patient have difficulty dressing or bathing?: No Independently performs ADLs?: Yes (appropriate for developmental age) Does the patient have difficulty walking or climbing stairs?: Yes Weakness of Legs: Right Weakness of Arms/Hands: None  Permission Sought/Granted   Permission granted to share information with : Yes, Verbal Permission Granted     Permission granted to share info w AGENCY: [facility]        Emotional Assessment       Orientation: : Oriented to Self,Oriented to Place,Oriented to  Time,Oriented to Situation Alcohol / Substance Use: Not Applicable Psych Involvement: No (comment)  Admission diagnosis:  Cellulitis in diabetic foot (HCC) [E11.628, L03.119] Diabetic foot infection (HCC) [E11.628, L08.9] Type 2 diabetes mellitus without complication, without long-term current use of insulin (HCC) [E11.9] Sepsis, due to unspecified organism, unspecified whether acute organ dysfunction present (HCC) [A41.9] Patient Active Problem List   Diagnosis Date Noted  . Diabetic foot infection (HCC) 09/27/2020  . Transaminitis 09/27/2020  . Coronary artery disease involving native coronary artery of native heart without angina pectoris 12/06/2015  . Uncontrolled type 2 diabetes mellitus with circulatory disorder, without long-term current use of insulin (HCC) 12/06/2015  . Preventative health care 12/06/2015  . Essential hypertension 12/06/2015  . Hyperlipidemia 12/06/2015  . Tobacco abuse 12/06/2015   PCP:    Cook, Jayce G, DO Pharmacy:   [id number] - [place] ([place]), [place] - [address] [place] ([place]) [place] [address] Phone: [phone] Fax: [phone]  [id number] -  [place], [place] - [address] [place] [address] Phone: [phone] Fax: [phone]  [facility] - [place], [place] - [address] [place] [address] Phone: [phone] Fax: [phone]     Social Determinants of Health (SDOH) Interventions    Readmission Risk Interventions No flowsheet data found.  

## 2020-09-28 NOTE — Consult Note (Signed)
[facility] Consult Note  MRN : [id number]  [name] is a 50 y.o. (13-Aug-1971) male who presents with chief complaint of  Chief Complaint  Patient presents with  . Hyperglycemia  . Foot Pain   History of Present Illness:  [name] is a 50 year old male with medical history significant of CAD, hypertension, hyperlipidemia, diabetes who presents with worsening right foot wound.              Patient states that 5 or 6 days ago he fell on getting out of his truck and cut his foot on a piece of metal (alluminum, not rusted) after slipping on some ice.  He states his foot was normal prior to this.  He has been staying at his home since the incident.  Following this fall and cut he did note some swelling of the foot but it was not until 2 days ago that his wound became worse and began to become red hot and developed ulcerations weeping some fluid. He reports fever and chills at home.  He states he took some Tylenol for his fever at home which helped.  Does not member when his last tetanus vaccine was.  Pain currently about 3 out of 10.  Denies chest pain, shortness of breath, abdominal pain, constipation, diarrhea, nausea, vomiting.  Vascular surgery was consulted by Dr. Posey Pronto for possible endovascular intervention.  Current Facility-Administered Medications  Medication Dose Route Frequency Provider Last Rate Last Admin  . acetaminophen (TYLENOL) tablet 650 mg  650 mg Oral Q6H PRN Marcelyn Bruins, MD   650 mg at 09/28/20 0001   Or  . acetaminophen (TYLENOL) suppository 650 mg  650 mg Rectal Q6H PRN Marcelyn Bruins, MD      . aspirin EC tablet 81 mg  81 mg Oral Daily Fritzi Mandes, MD   81 mg at 09/28/20 1230  . atorvastatin (LIPITOR) tablet 40 mg  40 mg Oral Daily Marcelyn Bruins, MD   40 mg at 09/28/20 4128  . carvedilol (COREG) tablet 3.125 mg  3.125 mg Oral BID WC Marcelyn Bruins, MD   3.125 mg at 09/28/20 7867  . ceFEPIme (MAXIPIME) 2  g in sodium chloride 0.9 % 100 mL IVPB  2 g Intravenous Q8H BelueAlver Sorrow, RPH 200 mL/hr at 09/28/20 0659 2 g at 09/28/20 0659  . heparin injection 5,000 Units  5,000 Units Subcutaneous Q8H Marcelyn Bruins, MD   5,000 Units at 09/28/20 [phone]  . HYDROcodone-acetaminophen (NORCO/VICODIN) 5-325 MG per tablet 1-2 tablet  1-2 tablet Oral Q6H PRN Fritzi Mandes, MD      . insulin aspart (novoLOG) injection 0-15 Units  0-15 Units Subcutaneous TID WC Marcelyn Bruins, MD   8 Units at 09/28/20 1225  . insulin aspart (novoLOG) injection 0-5 Units  0-5 Units Subcutaneous QHS Marcelyn Bruins, MD      . insulin aspart (novoLOG) injection 8 Units  8 Units Subcutaneous TID WC Fritzi Mandes, MD   8 Units at 09/28/20 1224  . insulin glargine (LANTUS) injection 20 Units  20 Units Subcutaneous Daily Fritzi Mandes, MD   20 Units at 09/28/20 1225  . metroNIDAZOLE (FLAGYL) IVPB 500 mg  500 mg Intravenous Q8H Marcelyn Bruins, MD 100 mL/hr at 09/28/20 0848 500 mg at 09/28/20 0848  . ondansetron (ZOFRAN) injection 4 mg  4 mg Intravenous Q6H PRN Fritzi Mandes, MD   4 mg at 09/28/20 1122  . polyethylene glycol (MIRALAX /  GLYCOLAX) packet 17 g  17 g Oral Daily PRN Marcelyn Bruins, MD      . sodium chloride flush (NS) 0.9 % injection 3 mL  3 mL Intravenous Q12H Marcelyn Bruins, MD   3 mL at 09/28/20 [phone]  . Tdap (BOOSTRIX) injection 0.5 mL  0.5 mL Intramuscular Once Carrie Mew, MD      . vancomycin (VANCOREADY) IVPB 1500 mg/300 mL  1,500 mg Intravenous Q12H Hallaji, Dani Gobble, [facility]       Past Medical History:  Diagnosis Date  . Diabetes mellitus without complication ([place])   . Heart disease    Past Surgical History:  Procedure Laterality Date  . BACK SURGERY     Social History Social History   Tobacco Use  . Smoking status: Current Every Day Smoker    Packs/day: 1.00    Types: Cigarettes  . Smokeless tobacco: Never Used  Substance Use Topics  . Alcohol use: Yes    Alcohol/week: 0.0 - 1.0  standard drinks  . Drug use: No   Family History Family History  Problem Relation Age of Onset  . Diabetes Father   . Heart disease Father   . Breast cancer Mother   . Lung cancer Paternal Grandmother   Denies family history of peripheral artery disease, venous disease or renal disease.  No Known Allergies  REVIEW OF SYSTEMS (Negative unless checked)  Constitutional: [] Weight loss  [] Fever  [] Chills Cardiac: [] Chest pain   [] Chest pressure   [] Palpitations   [] Shortness of breath when laying flat   [] Shortness of breath at rest   [] Shortness of breath with exertion. Vascular:  [] Pain in legs with walking   [] Pain in legs at rest   [] Pain in legs when laying flat   [] Claudication   [] Pain in feet when walking  [] Pain in feet at rest  [] Pain in feet when laying flat   [] History of DVT   [] Phlebitis   [] Swelling in legs   [] Varicose veins   [] Non-healing ulcers Pulmonary:   [] Uses home oxygen   [] Productive cough   [] Hemoptysis   [] Wheeze  [] COPD   [] Asthma Neurologic:  [] Dizziness  [] Blackouts   [] Seizures   [] History of stroke   [] History of TIA  [] Aphasia   [] Temporary blindness   [] Dysphagia   [] Weakness or numbness in arms   [] Weakness or numbness in legs Musculoskeletal:  [] Arthritis   [] Joint swelling   [] Joint pain   [] Low back pain Hematologic:  [] Easy bruising  [] Easy bleeding   [] Hypercoagulable state   [] Anemic  [] Hepatitis Gastrointestinal:  [] Blood in stool   [] Vomiting blood  [] Gastroesophageal reflux/heartburn   [] Difficulty swallowing. Genitourinary:  [] Chronic kidney disease   [] Difficult urination  [] Frequent urination  [] Burning with urination   [] Blood in urine Skin:  [] Rashes   [x] Ulcers   [x] Wounds Psychological:  [] History of anxiety   []  History of major depression.  Physical Examination  Vitals:   09/28/20 0054 09/28/20 0524 09/28/20 0756 09/28/20 1148  BP: (!) 148/83 129/78 119/63 135/77  Pulse: 97 88 89 98  Resp: 17 16 16 18   Temp: 98.1 F (36.7 C) 98.7 F  (37.1 C) 98.9 F (37.2 C) 99.3 F (37.4 C)  TempSrc: Oral Oral    SpO2: 97% 92% 97% 91%  Weight:      Height:       Body mass index is 29.53 kg/m. Gen:  WD/WN, NAD Head: [place]/AT, No temporalis wasting. Prominent temp pulse not noted. Ear/Nose/Throat: Hearing grossly intact,  nares w/o erythema or drainage, oropharynx w/o Erythema/Exudate Eyes: Sclera non-icteric, conjunctiva clear Neck: Trachea midline.  No JVD.  Pulmonary:  Good air movement, respirations not labored, equal bilaterally.  Cardiac: RRR, normal S1, S2. Vascular:  Vessel Right Left  Radial Palpable Palpable  Ulnar Palpable Palpable  Brachial Palpable Palpable  Carotid Palpable, without bruit Palpable, without bruit  Aorta Not palpable N/A  Femoral Palpable Palpable  Popliteal Palpable Palpable  PT Palpable Palpable  DP Palpable Palpable   Gastrointestinal: soft, non-tender/non-distended. No guarding/reflex.  Musculoskeletal: M/S 5/5 throughout.  Extremities without ischemic changes.  No deformity or atrophy. No edema. Neurologic: Sensation grossly intact in extremities.  Symmetrical.  Speech is fluent. Motor exam as listed above. Psychiatric: Judgment intact, Mood & affect appropriate for pt's clinical situation. Dermatologic: Erythema located to the lateral aspect of the foot.  Ulceration noted as well. Lymph : No Cervical, Axillary, or Inguinal lymphadenopathy.  CBC Lab Results  Component Value Date   WBC 16.4 (H) 09/28/2020   HGB 13.0 09/28/2020   HCT 37.4 (L) 09/28/2020   MCV 94.2 09/28/2020   PLT 211 09/28/2020   BMET    Component Value Date/Time   NA 130 (L) 09/28/2020 0516   K 3.8 09/28/2020 0516   CL 94 (L) 09/28/2020 0516   CO2 25 09/28/2020 0516   GLUCOSE 432 (H) 09/28/2020 0516   BUN 19 09/28/2020 0516   CREATININE 1.11 09/28/2020 0516   CREATININE 0.98 04/13/2015 1440   CALCIUM 8.7 (L) 09/28/2020 0516   GFRNONAA >60 09/28/2020 0516   GFRAA >60 01/25/2017 1755   Estimated Creatinine  Clearance: 102.5 mL/min (by C-G formula based on SCr of 1.11 mg/dL).  COAG Lab Results  Component Value Date   INR 1.0 02/19/2007   INR 1.0 01/29/2007   Radiology DG Foot Complete Right  Result Date: 09/27/2020 CLINICAL DATA:  Fall EXAM: RIGHT FOOT COMPLETE - 3+ VIEW COMPARISON:  None. FINDINGS: Degenerative changes in the tarsal region. No acute bony abnormality. Specifically, no fracture, subluxation, or dislocation. Soft tissues are intact. IMPRESSION: No acute bony abnormality. Electronically Signed   By: Rolm Baptise M.D.   On: 09/27/2020 19:26   Assessment/Plan Ayomikun Starling is a 50 year old male with medical history significant of CAD, hypertension, hyperlipidemia, diabetes who presents with worsening right foot wound.  1.  Right foot wound: Patient with multiple risk factors for atherosclerotic disease.  Patient presents with progressively worsening right foot wound and erythema.  On exam, the patient's pedal pulses are palpable.  Podiatry is also been consulted.  Podiatry is going to be bringing the patient to the operating room tomorrow for an I&D.  If minimal/no bleeding is noted we recommend moving forward with a angiogram.  We will discuss with podiatry after surgery.  Procedure, risk and benefits were explained to the patient.  All questions were answered.  If the patient does need an angiogram in the future he is currently consenting.  2.  Diabetes: On appropriate medications. Encouraged good control as its slows the progression of atherosclerotic disease.  3.  Hyperlipidemia: Plavix and statin for medical management. We will consider the addition of an aspirin. Encouraged good control as its slows the progression of atherosclerotic disease.  4.  Tobacco abuse: We had a discussion for approximately three minutes regarding the absolute need for smoking cessation due to the deleterious nature of tobacco on the vascular system. We discussed the tobacco use would diminish  patency of any intervention, and likely significantly worsen  progressio of disease. We discussed multiple agents for quitting including replacement therapy or medications to reduce cravings such as Chantix. The patient voices their understanding of the importance of smoking cessation.  Discussed with Dr. Francene Castle, PA-C  09/28/2020 1:47 PM  This note was created with Dragon medical transcription system.  Any error is purely unintentional

## 2020-09-28 NOTE — Consult Note (Signed)
ORTHOPAEDIC CONSULTATION  REQUESTING PHYSICIAN: Fritzi Mandes, MD  Chief Complaint: Infection right foot  HPI: [name] is a 50 y.o. male who complains of infection to his right foot.  He states this started up about a week ago.  He is diabetic.  He noticed redness into his foot.  Admitted with sepsis.  He states he has poor appetite.  Positive chills and nausea.  Longstanding history of diabetes.  Past Medical History:  Diagnosis Date  . Diabetes mellitus without complication ([place])   . Heart disease    Past Surgical History:  Procedure Laterality Date  . BACK SURGERY     Social History   Socioeconomic History  . Marital status: Single    Spouse name: Not on file  . Number of children: Not on file  . Years of education: Not on file  . Highest education level: Not on file  Occupational History  . Not on file  Tobacco Use  . Smoking status: Current Every Day Smoker    Packs/day: 1.00    Types: Cigarettes  . Smokeless tobacco: Never Used  Substance and Sexual Activity  . Alcohol use: Yes    Alcohol/week: 0.0 - 1.0 standard drinks  . Drug use: No  . Sexual activity: Not on file  Other Topics Concern  . Not on file  Social History Narrative  . Not on file   Social Determinants of Health   Financial Resource Strain: Not on file  Food Insecurity: Not on file  Transportation Needs: Not on file  Physical Activity: Not on file  Stress: Not on file  Social Connections: Not on file   Family History  Problem Relation Age of Onset  . Diabetes Father   . Heart disease Father   . Breast cancer Mother   . Lung cancer Paternal Grandmother    No Known Allergies Prior to Admission medications   Medication Sig Start Date End Date Taking? Authorizing Provider  atorvastatin (LIPITOR) 40 MG tablet Take 1 tablet (40 mg total) by mouth daily. 12/06/15   Coral Spikes, DO  Blood Glucose Monitoring Suppl (RELION PRIME MONITOR) DEVI Use up to 4 times daily to check blood  sugars. 12/06/15   Coral Spikes, DO  clopidogrel (PLAVIX) 75 MG tablet TAKE ONE TABLET BY MOUTH ONCE DAILY Patient not taking: Reported on 09/28/2020 01/27/17   Coral Spikes, DO  empagliflozin (JARDIANCE) 10 MG TABS tablet Take 10 mg by mouth daily. Patient not taking: Reported on 09/28/2020 12/08/15   Coral Spikes, DO  gabapentin (NEURONTIN) 300 MG capsule Take 600 mg by mouth at bedtime as needed. 09/10/20   [provider]  glipiZIDE (GLUCOTROL XL) 10 MG 24 hr tablet Take 10 mg by mouth daily. 09/10/20   [provider]  glucose blood (RELION PRIME TEST) test strip Use as instructed 12/06/15   Coral Spikes, DO  lisinopril (PRINIVIL,ZESTRIL) 5 MG tablet TAKE ONE TABLET BY MOUTH ONCE DAILY 01/27/17   Coral Spikes, DO  meloxicam (MOBIC) 7.5 MG tablet Take 7.5 mg by mouth 2 (two) times daily. 09/10/20   [provider]  metFORMIN (GLUCOPHAGE) 500 MG tablet TAKE ONE TABLET BY MOUTH TWICE DAILY WITH A MEAL 01/27/17   Cook, [place] G, DO  ReliOn Ultra Thin Lancets MISC Use as directed to take blood sugar. 12/06/15   Coral Spikes, DO  sildenafil (VIAGRA) 25 MG tablet Take 1 tablet by mouth daily. 09/10/20   [provider]  sulfamethoxazole-trimethoprim (  BACTRIM DS,SEPTRA DS) 800-160 MG tablet Take 1 tablet by mouth 2 (two) times daily. Patient not taking: Reported on 09/28/2020 06/15/16   Johnn Hai, PA-C   DG Foot Complete Right  Result Date: 09/27/2020 CLINICAL DATA:  Fall EXAM: RIGHT FOOT COMPLETE - 3+ VIEW COMPARISON:  None. FINDINGS: Degenerative changes in the tarsal region. No acute bony abnormality. Specifically, no fracture, subluxation, or dislocation. Soft tissues are intact. IMPRESSION: No acute bony abnormality. Electronically Signed   By: Rolm Baptise M.D.   On: 09/27/2020 19:26    Positive ROS: All other systems have been reviewed and were otherwise negative with the exception of those mentioned in the HPI and as above.  12 point ROS was  performed.  Physical Exam: General: Alert and oriented.  No apparent distress.  Vascular:  Left foot:Dorsalis Pedis:  diminished Posterior Tibial:  present  Right foot: Dorsalis Pedis:  present Posterior Tibial:  diminished  Neuro:absent protective sensation but he does have diffuse gross sensation  Derm: Left foot without ulceration.  He has an ulcer on the anterior aspect of his left leg.  Findings right proximal lower leg he has a superficial ulceration.  This is dry.  Severe erythema to the lateral aspect of the foot with a large bullous ulceration with fluctuance noted.  See clinical picture.  Severe lymphangitic streaking is noted to the medial arch as well as dorsal foot.  Ortho/MS: Diffuse edema to the right lower extremity  Assessment: Diabetic foot infection right foot  Plan: X-rays are negative.  I am going to order an MRI though I suspect at least the fifth ray is infected and there is abscess to this entire lateral column.  I discussed the need for surgical incision and drainage with likely amputation of the fifth ray I&D of the dorsal and plantar aspect of the foot.  I discussed the severe infection that he has to this foot and this is a limb salvage attempt at this time.  We will plan to perform tomorrow.  I will see him at the time of surgery.  The risk benefits alternatives and complications have been discussed with the patient in full.    Elesa Hacker, DPM Cell [phone]   09/28/2020 1:34 PM

## 2020-09-28 NOTE — Progress Notes (Signed)
[facility] [place] NAME: [name]    MR#:  [id number]  DATE OF BIRTH:  September 09, 1970  SUBJECTIVE:  complains of significant pain right foot. Ate some breakfast however has some nausea.  REVIEW OF SYSTEMS:   Review of Systems  Constitutional: Positive for chills. Negative for fever and weight loss.  HENT: Negative for ear discharge, ear pain and nosebleeds.   Eyes: Negative for blurred vision, pain and discharge.  Respiratory: Negative for sputum production, shortness of breath, wheezing and stridor.   Cardiovascular: Negative for chest pain, palpitations, orthopnea and PND.  Gastrointestinal: Positive for nausea. Negative for abdominal pain, diarrhea and vomiting.  Genitourinary: Negative for frequency and urgency.  Musculoskeletal: Positive for joint pain. Negative for back pain.  Neurological: Negative for sensory change, speech change, focal weakness and weakness.  Psychiatric/Behavioral: Negative for depression and hallucinations. The patient is not nervous/anxious.    Tolerating Diet:yes Tolerating PT:   DRUG ALLERGIES:  No Known Allergies  VITALS:  Blood pressure 135/77, pulse 98, temperature 99.3 F (37.4 C), resp. rate 18, height 6' 2"  (1.88 m), weight 104.3 kg, SpO2 91 %.  PHYSICAL EXAMINATION:   Physical Exam  GENERAL:  50 y.o.-year-old patient lying in the bed with no acute distress.  LUNGS: Normal breath sounds bilaterally, no wheezing, rales, rhonchi. No use of accessory muscles of respiration.  CARDIOVASCULAR: S1, S2 normal. No murmurs, rubs, or gallops.  ABDOMEN: Soft, nontender, nondistended. Bowel sounds present. No organomegaly or mass.  EXTREMITIES: 09/28/2020     NEUROLOGIC: grossly nonfocal PSYCHIATRIC:  patient is alert and oriented x 3.  SKIN: as above  LABORATORY PANEL:  CBC Recent Labs  Lab 09/28/20 0516  WBC 16.4*  HGB 13.0  HCT 37.4*  PLT 211    Chemistries  Recent Labs  Lab  09/28/20 0516  NA 130*  K 3.8  CL 94*  CO2 25  GLUCOSE 432*  BUN 19  CREATININE 1.11  CALCIUM 8.7*  AST 63*  ALT 57*  ALKPHOS 203*  BILITOT 2.5*   Cardiac Enzymes No results for input(s): TROPONINI in the last 168 hours. RADIOLOGY:  DG Foot Complete Right  Result Date: 09/27/2020 CLINICAL DATA:  Fall EXAM: RIGHT FOOT COMPLETE - 3+ VIEW COMPARISON:  None. FINDINGS: Degenerative changes in the tarsal region. No acute bony abnormality. Specifically, no fracture, subluxation, or dislocation. Soft tissues are intact. IMPRESSION: No acute bony abnormality. Electronically Signed   By: Rolm Baptise M.D.   On: 09/27/2020 19:26   ASSESSMENT AND PLAN:  [name] is a 50 y.o. male with medical history significant of CAD, hypertension, hyperlipidemia, diabetes who presents with worsening right foot wound.Patient states that 5 or 6 days ago he fell on getting out of his truck and cut his foot on a piece of metal (alluminum, not rusted) after slipping on some ice.   Sepsis POA due to Cellulitis/Diabetice foot post injury  -- Patient with a history of diabetes appears to be currently poorly controlled versus elevated glucose from ongoing infection with glucose in the ED of 390s. -- Patient came in with tachycardia heart rate above 90 with elevated white count and significant right foot infection. --Sustained a foot wound 2 days ago off a piece of metal from his truck.  Wound has been worsening since that time.  Now with weeping ulcers. --> Does report fevers and chills at home.  > tdap given -  podiatry consult with Dr. Vickki Muff -- given  history of tobacco abuse and extent of infection vascular surgery consultation with Dr. Delana Meyer - Continue Vanco, cefepime, Flagyl -  ID consultation with Dr. 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 -- patient only on metformin thousand milligrams BID -- there seems to be dietary noncompliance   -- A1c 11.3 -- will start Lantus 20 units daily and aspart TID -- hold oral home meds  CAD Hypertension Hyperlipidemia - Continue home Coreg, atorvastatin -lisinopril on hold -cont asa  Transaminitis > Unclear etiology at this time - We'll continue to monitor for now as we treat his acute infection  DVT prophylaxis:      Heparin  Code Status:              Full  Family Communication:        patient tells me his family has been updated I do not need to call anybody Disposition Plan:              Patient is from:                        Home             Anticipated DC to:                   Home             Anticipated DC date:               1 to 5 days   Level of care: Med-Surg Status is: inpatient        TOTAL TIME TAKING CARE OF THIS PATIENT: *30* minutes.  >50% time spent on counselling and coordination of care  Note: This dictation was prepared with Dragon dictation along with smaller phrase technology. Any transcriptional errors that result from this process are unintentional.  Fritzi Mandes M.D    [facility]   CC: Primary care physician; Coral Spikes, DOPatient ID: [name], male   DOB: 10/23/1970, 50 y.o.   MRN: [id number]

## 2020-09-29 ENCOUNTER — Inpatient Hospital Stay: Payer: Self-pay | Admitting: Anesthesiology

## 2020-09-29 ENCOUNTER — Encounter: Payer: Self-pay | Admitting: Internal Medicine

## 2020-09-29 ENCOUNTER — Encounter
Admission: EM | Disposition: A | Discharge status: Home Health | Payer: Self-pay | Source: Home / Self Care | Attending: Internal Medicine

## 2020-09-29 ENCOUNTER — Inpatient Hospital Stay: Payer: Self-pay

## 2020-09-29 HISTORY — PX: INCISION AND DRAINAGE: SHX5863

## 2020-09-29 LAB — GLUCOSE, CAPILLARY
Glucose-Capillary: 214 mg/dL — ABNORMAL HIGH (ref 70–99)
Glucose-Capillary: 221 mg/dL — ABNORMAL HIGH (ref 70–99)
Glucose-Capillary: 198 mg/dL — ABNORMAL HIGH (ref 70–99)
Glucose-Capillary: 220 mg/dL — ABNORMAL HIGH (ref 70–99)
Glucose-Capillary: 271 mg/dL — ABNORMAL HIGH (ref 70–99)
Glucose-Capillary: 221 mg/dL — ABNORMAL HIGH (ref 70–99)

## 2020-09-29 SURGERY — INCISION AND DRAINAGE
Anesthesia: General | Laterality: Right

## 2020-09-29 MED ORDER — ACETAMINOPHEN 10 MG/ML IV SOLN
INTRAVENOUS | Status: DC | PRN
  Administered 2020-09-29: 1000 mg via INTRAVENOUS

## 2020-09-29 MED ORDER — PROPOFOL 10 MG/ML IV BOLUS
INTRAVENOUS | Status: AC
  Filled 2020-09-29: qty 20

## 2020-09-29 MED ORDER — ONDANSETRON HCL 4 MG/2ML IJ SOLN: 4.0000 mg | Freq: Once | INTRAMUSCULAR | Status: DC | PRN

## 2020-09-29 MED ORDER — PROMETHAZINE HCL 25 MG/ML IJ SOLN
INTRAMUSCULAR | Status: AC
  Filled 2020-09-29: qty 1

## 2020-09-29 MED ORDER — OXYCODONE HCL 5 MG PO TABS: 5.0000 mg | ORAL_TABLET | Freq: Once | ORAL | Status: DC | PRN

## 2020-09-29 MED ORDER — FENTANYL CITRATE (PF) 100 MCG/2ML IJ SOLN
INTRAMUSCULAR | Status: AC
  Filled 2020-09-29: qty 2

## 2020-09-29 MED ORDER — LIDOCAINE HCL (CARDIAC) PF 100 MG/5ML IV SOSY
PREFILLED_SYRINGE | INTRAVENOUS | Status: DC | PRN
  Administered 2020-09-29: 80 mg via INTRAVENOUS

## 2020-09-29 MED ORDER — MIDAZOLAM HCL 2 MG/2ML IJ SOLN
INTRAMUSCULAR | Status: DC | PRN
  Administered 2020-09-29: 2 mg via INTRAVENOUS

## 2020-09-29 MED ORDER — SUCCINYLCHOLINE CHLORIDE 20 MG/ML IJ SOLN
INTRAMUSCULAR | Status: DC | PRN
  Administered 2020-09-29: 100 mg via INTRAVENOUS

## 2020-09-29 MED ORDER — LIDOCAINE-EPINEPHRINE 1 %-1:100000 IJ SOLN
INTRAMUSCULAR | Status: DC | PRN
  Administered 2020-09-29: 10 mL

## 2020-09-29 MED ORDER — PROPOFOL 10 MG/ML IV BOLUS
INTRAVENOUS | Status: DC | PRN
  Administered 2020-09-29: 150 mg via INTRAVENOUS

## 2020-09-29 MED ORDER — MIDAZOLAM HCL 2 MG/2ML IJ SOLN
INTRAMUSCULAR | Status: AC
  Filled 2020-09-29: qty 2

## 2020-09-29 MED ORDER — BUPIVACAINE HCL 0.5 % IJ SOLN
INTRAMUSCULAR | Status: DC | PRN
  Administered 2020-09-29: 10 mL

## 2020-09-29 MED ORDER — ONDANSETRON HCL 4 MG/2ML IJ SOLN
INTRAMUSCULAR | Status: DC | PRN
  Administered 2020-09-29: 4 mg via INTRAVENOUS

## 2020-09-29 MED ORDER — FENTANYL CITRATE (PF) 100 MCG/2ML IJ SOLN: 25.0000 ug | INTRAMUSCULAR | Status: DC | PRN

## 2020-09-29 MED ORDER — PROMETHAZINE HCL 25 MG/ML IJ SOLN
INTRAMUSCULAR | Status: DC | PRN
  Administered 2020-09-29: 12.5 mg via INTRAVENOUS

## 2020-09-29 MED ORDER — METHYLENE BLUE 0.5 % INJ SOLN
INTRAVENOUS | Status: AC
  Filled 2020-09-29: qty 10

## 2020-09-29 MED ORDER — LACTATED RINGERS IV SOLN: INTRAVENOUS | Status: DC | PRN

## 2020-09-29 MED ORDER — BUPIVACAINE LIPOSOME 1.3 % IJ SUSP
INTRAMUSCULAR | Status: AC
  Filled 2020-09-29: qty 20

## 2020-09-29 MED ORDER — ACETAMINOPHEN 10 MG/ML IV SOLN
INTRAVENOUS | Status: AC
  Filled 2020-09-29: qty 100

## 2020-09-29 MED ORDER — FENTANYL CITRATE (PF) 100 MCG/2ML IJ SOLN
INTRAMUSCULAR | Status: DC | PRN
  Administered 2020-09-29 (×2): 50 ug via INTRAVENOUS

## 2020-09-29 MED ORDER — ONDANSETRON HCL 4 MG/2ML IJ SOLN
INTRAMUSCULAR | Status: AC
  Filled 2020-09-29: qty 2

## 2020-09-29 MED ORDER — BUPIVACAINE HCL (PF) 0.5 % IJ SOLN
INTRAMUSCULAR | Status: AC
  Filled 2020-09-29: qty 30

## 2020-09-29 MED ORDER — INSULIN STARTER KIT- PEN NEEDLES (ENGLISH)
1.0000 | Freq: Once | Status: AC
  Administered 2020-09-30: 1
  Filled 2020-09-29: qty 1

## 2020-09-29 MED ORDER — OXYCODONE HCL 5 MG/5ML PO SOLN: 5.0000 mg | Freq: Once | ORAL | Status: DC | PRN

## 2020-09-29 MED ORDER — LIDOCAINE-EPINEPHRINE 1 %-1:100000 IJ SOLN
INTRAMUSCULAR | Status: AC
  Filled 2020-09-29: qty 1

## 2020-09-29 MED ORDER — PHENYLEPHRINE HCL (PRESSORS) 10 MG/ML IV SOLN
INTRAVENOUS | Status: DC | PRN
  Administered 2020-09-29 (×2): 100 ug via INTRAVENOUS

## 2020-09-29 SURGICAL SUPPLY — 77 items
BLADE OSC/SAGITTAL MD 5.5X18 (BLADE) ×3 IMPLANT
BLADE OSCILLATING/SAGITTAL (BLADE)
BLADE SURG 15 STRL LF DISP TIS (BLADE) ×2 IMPLANT
BLADE SURG 15 STRL SS (BLADE) ×3
BLADE SURG MINI STRL (BLADE) ×3 IMPLANT
BLADE SW THK.38XMED LNG THN (BLADE) IMPLANT
BNDG CMPR STD VLCR NS LF 5.8X4 (GAUZE/BANDAGES/DRESSINGS) ×2
BNDG COHESIVE 4X5 TAN STRL (GAUZE/BANDAGES/DRESSINGS) ×3 IMPLANT
BNDG COHESIVE 6X5 TAN STRL LF (GAUZE/BANDAGES/DRESSINGS) ×3 IMPLANT
BNDG CONFORM 2 STRL LF (GAUZE/BANDAGES/DRESSINGS) ×3 IMPLANT
BNDG CONFORM 3 STRL LF (GAUZE/BANDAGES/DRESSINGS) ×6 IMPLANT
BNDG ELASTIC 4X5.8 VLCR NS LF (GAUZE/BANDAGES/DRESSINGS) ×3 IMPLANT
BNDG ELASTIC 4X5.8 VLCR STR LF (GAUZE/BANDAGES/DRESSINGS) ×3 IMPLANT
BNDG ESMARK 4X12 TAN STRL LF (GAUZE/BANDAGES/DRESSINGS) ×3 IMPLANT
BNDG GAUZE 4.5X4.1 6PLY STRL (MISCELLANEOUS) ×3 IMPLANT
CANISTER SUCT 1200ML W/VALVE (MISCELLANEOUS) ×3 IMPLANT
CANISTER SUCT 3000ML PPV (MISCELLANEOUS) ×3 IMPLANT
CANISTER WOUND CARE 500ML ATS (WOUND CARE) ×1 IMPLANT
COVER WAND RF STERILE (DRAPES) ×3 IMPLANT
CUFF TOURN SGL QUICK 12 (TOURNIQUET CUFF) IMPLANT
CUFF TOURN SGL QUICK 18X4 (TOURNIQUET CUFF) ×2 IMPLANT
DRAPE FLUOR MINI C-ARM 54X84 (DRAPES) ×3 IMPLANT
DRAPE XRAY CASSETTE 23X24 (DRAPES) ×3 IMPLANT
DRSG MEPILEX FLEX 3X3 (GAUZE/BANDAGES/DRESSINGS) IMPLANT
DURAPREP 26ML APPLICATOR (WOUND CARE) ×1 IMPLANT
ELECT REM PT RETURN 9FT ADLT (ELECTROSURGICAL) ×3
ELECTRODE REM PT RTRN 9FT ADLT (ELECTROSURGICAL) ×2 IMPLANT
GAUZE PACKING 1/4 X5 YD (GAUZE/BANDAGES/DRESSINGS) ×1 IMPLANT
GAUZE PACKING IODOFORM 1/2 (PACKING) ×3 IMPLANT
GAUZE PACKING IODOFORM 1X5 (PACKING) ×3 IMPLANT
GAUZE SPONGE 4X4 12PLY STRL (GAUZE/BANDAGES/DRESSINGS) ×3 IMPLANT
GAUZE XEROFORM 1X8 LF (GAUZE/BANDAGES/DRESSINGS) ×3 IMPLANT
GLOVE BIO SURGEON STRL SZ7.5 (GLOVE) ×3 IMPLANT
GLOVE INDICATOR 8.0 STRL GRN (GLOVE) ×3 IMPLANT
GOWN STRL REUS W/ TWL XL LVL3 (GOWN DISPOSABLE) ×4 IMPLANT
GOWN STRL REUS W/TWL MED LVL3 (GOWN DISPOSABLE) ×3 IMPLANT
GOWN STRL REUS W/TWL XL LVL3 (GOWN DISPOSABLE) ×6
HANDPIECE VERSAJET DEBRIDEMENT (MISCELLANEOUS) IMPLANT
IV NS 1000ML (IV SOLUTION) ×3
IV NS 1000ML BAXH (IV SOLUTION) ×2 IMPLANT
KIT DRSG VAC SLVR GRANUFM (MISCELLANEOUS) ×3 IMPLANT
KIT TURNOVER KIT A (KITS) ×3 IMPLANT
LABEL OR SOLS (LABEL) ×3 IMPLANT
MANIFOLD NEPTUNE II (INSTRUMENTS) ×3 IMPLANT
NDL FILTER BLUNT 18X1 1/2 (NEEDLE) ×1 IMPLANT
NDL HYPO 25X1 1.5 SAFETY (NEEDLE) ×1 IMPLANT
NEEDLE FILTER BLUNT 18X 1/2SAF (NEEDLE) ×1
NEEDLE FILTER BLUNT 18X1 1/2 (NEEDLE) ×2 IMPLANT
NEEDLE HYPO 25X1 1.5 SAFETY (NEEDLE) ×3 IMPLANT
NS IRRIG 500ML POUR BTL (IV SOLUTION) ×3 IMPLANT
PACK EXTREMITY ARMC (MISCELLANEOUS) ×3 IMPLANT
PAD ABD DERMACEA PRESS 5X9 (GAUZE/BANDAGES/DRESSINGS) ×6 IMPLANT
PENCIL ELECTRO HAND CTR (MISCELLANEOUS) ×3 IMPLANT
PULSAVAC PLUS IRRIG FAN TIP (DISPOSABLE) ×3
RASP SM TEAR CROSS CUT (RASP) IMPLANT
SHIELD FULL FACE ANTIFOG 7M (MISCELLANEOUS) ×3 IMPLANT
SOL .9 NS 3000ML IRR  AL (IV SOLUTION) ×3
SOL .9 NS 3000ML IRR AL (IV SOLUTION) ×2
SOL .9 NS 3000ML IRR UROMATIC (IV SOLUTION) ×2 IMPLANT
STOCKINETTE IMPERVIOUS 9X36 MD (GAUZE/BANDAGES/DRESSINGS) ×3 IMPLANT
STOCKINETTE M/LG 89821 (MISCELLANEOUS) ×3 IMPLANT
STRAP SAFETY 5IN WIDE (MISCELLANEOUS) ×3 IMPLANT
SUT ETHILON 2 0 FS 18 (SUTURE) ×4 IMPLANT
SUT ETHILON 3-0 FS-10 30 BLK (SUTURE)
SUT ETHILON 4-0 (SUTURE)
SUT ETHILON 4-0 FS2 18XMFL BLK (SUTURE)
SUT ETHILON 5-0 FS-2 18 BLK (SUTURE) ×1 IMPLANT
SUT VIC AB 3-0 SH 27 (SUTURE)
SUT VIC AB 3-0 SH 27X BRD (SUTURE) ×1 IMPLANT
SUT VIC AB 4-0 FS2 27 (SUTURE) ×1 IMPLANT
SUTURE EHLN 3-0 FS-10 30 BLK (SUTURE) ×1 IMPLANT
SUTURE ETHLN 4-0 FS2 18XMF BLK (SUTURE) ×1 IMPLANT
SWAB CULTURE AMIES ANAERIB BLU (MISCELLANEOUS) IMPLANT
SYR 10ML LL (SYRINGE) ×9 IMPLANT
SYR 3ML LL SCALE MARK (SYRINGE) ×3 IMPLANT
TIP FAN IRRIG PULSAVAC PLUS (DISPOSABLE) ×2 IMPLANT
TRAY PREP VAG/GEN (MISCELLANEOUS) ×3 IMPLANT

## 2020-09-29 NOTE — Progress Notes (Signed)
Pt wishes to speak to MD prior to signing surgical consent.

## 2020-09-29 NOTE — Anesthesia Procedure Notes (Signed)
Procedure Name: Intubation Performed by: Gilford Raid, CRNA Pre-anesthesia Checklist: Patient identified, Patient being monitored, Timeout performed, Emergency Drugs available and Suction available Patient Re-evaluated:Patient Re-evaluated prior to induction Oxygen Delivery Method: Circle system utilized Preoxygenation: Pre-oxygenation with 100% oxygen Induction Type: IV induction Ventilation: Mask ventilation without difficulty Laryngoscope Size: Miller and 2 Grade View: Grade I Tube type: Oral Tube size: 7.5 mm Number of attempts: 1 Airway Equipment and Method: Stylet Placement Confirmation: ETT inserted through vocal cords under direct vision,  positive ETCO2 and breath sounds checked- equal and bilateral Secured at: 21 cm Tube secured with: Tape Dental Injury: Teeth and Oropharynx as per pre-operative assessment

## 2020-09-29 NOTE — Progress Notes (Addendum)
Inpatient Diabetes Program Recommendations  AACE/ADA: New Consensus Statement on Inpatient Glycemic Control (2015)  Target Ranges:  Prepandial:   less than 140 mg/dL      Peak postprandial:   less than 180 mg/dL (1-2 hours)      Critically ill patients:  140 - 180 mg/dL   Results for [name], [name] (MRN [id number]) as of 09/29/2020 07:35  Ref. Range 09/28/2020 07:55 09/28/2020 11:48 09/28/2020 16:39 09/28/2020 22:17  Glucose-Capillary Latest Ref Range: 70 - 99 mg/dL 646 (H)  25 units NOVOLOG @8 :35am 263 (H)  16 units NOVOLOG    20 units LANTUS @12 :25pm  160 (H)  11 units NOVOLOG @6 :08pm 119 (H)   Results for [name], [name] (MRN ) as of 09/29/2020 12:24  Ref. Range 09/29/2020 08:11 09/29/2020 10:01 09/29/2020 11:35  Glucose-Capillary Latest Ref Range: 70 - 99 mg/dL 11/27/2020 (H) 11/27/2020 (H) 11/27/2020 (H)   Results for [name], [name] (MRN [id number]) as of 09/29/2020 07:35  Ref. 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  Medication management clinic does have insulin pens available for long-acting insulin.  TOC team will need to let [phone] know which insulin they have available.   Not currently Insured  Current A1c of 11.2% shows very poor glucose control at home  Patient has CDL and drives truck--Per DM Coordinator notes from yesterday (02/10), pt is willing to take Insulin short-term to help heal foot.  Has been referred to [facility] per [facility] notes.  Spoke with pt this afternoon.  Pt sleepy (post-op) but was  able to keep his attention by turning on the lights and having him sit up in bed.  Discussed going home on insulin.  Pt stated he is willing to take insulin short-term to help with healing.  Discussed what Lantus insulin is and how it works, when to take, [place].   Educated patient on insulin pen use at home.  Reviewed all steps of insulin pen including attachment of needle, 2-unit air shot, dialing up dose, giving injection, rotation of injection sites, removing needle, disposal of sharps, storage of unused insulin, disposal of insulin etc.  Patient able to provide successful return demonstration.  Reviewed troubleshooting with insulin pen.  Also reviewed Signs/Symptoms of Hypoglycemia with patient and how to treat Hypoglycemia at home.  Have asked RNs caring for patient to please allow patient to give all injections here in hospital as much as possible for practice.  Have also emailed pt a Youtube video on insulin pen use at home as well.    --Will follow patient during hospitalization--  09-24-2002 RN, MSN, CDE Diabetes Coordinator Inpatient Glycemic Control Team Team Pager: [phone] (8a-5p)

## 2020-09-29 NOTE — Transfer of Care (Signed)
Immediate Anesthesia Transfer of Care Note  Patient: [name]  Procedure(s) Performed: INCISION AND DRAINAGE, RIGHT FOOT (Right )  Patient Location: PACU  Anesthesia Type:General  Level of Consciousness: awake, alert  and oriented  Airway & Oxygen Therapy: Patient Spontanous Breathing and Patient connected to face mask oxygen  Post-op Assessment: Report given to RN and Post -op Vital signs reviewed and stable  Post vital signs: Reviewed and stable  Last Vitals:  Vitals Value Taken Time  BP 125/72 09/29/20 1123  Temp    Pulse 78 09/29/20 1130  Resp 32 09/29/20 1130  SpO2 94 % 09/29/20 1130  Vitals shown include unvalidated device data.  Last Pain:  Vitals:   09/29/20 1000  TempSrc: Temporal  PainSc: 6          Complications: No complications documented.

## 2020-09-29 NOTE — Progress Notes (Signed)
[facility] [place] NAME: [name]    MR#:  [id number]  DATE OF BIRTH:  Dec 30, 1970  SUBJECTIVE:  complains of significant pain right foot.  NPO for surgery today  REVIEW OF SYSTEMS:   Review of Systems  Constitutional: Positive for chills. Negative for fever and weight loss.  HENT: Negative for ear discharge, ear pain and nosebleeds.   Eyes: Negative for blurred vision, pain and discharge.  Respiratory: Negative for sputum production, shortness of breath, wheezing and stridor.   Cardiovascular: Negative for chest pain, palpitations, orthopnea and PND.  Gastrointestinal: Positive for nausea. Negative for abdominal pain, diarrhea and vomiting.  Genitourinary: Negative for frequency and urgency.  Musculoskeletal: Positive for joint pain. Negative for back pain.  Neurological: Negative for sensory change, speech change, focal weakness and weakness.  Psychiatric/Behavioral: Negative for depression and hallucinations. The patient is not nervous/anxious.    Tolerating Diet:yes Tolerating PT:   DRUG ALLERGIES:  No Known Allergies  VITALS:  Blood pressure 126/81, pulse 76, temperature 98.4 F (36.9 C), temperature source Temporal, resp. rate (!) 32, height 6' 2"  (1.88 m), weight 104.3 kg, SpO2 94 %.  PHYSICAL EXAMINATION:   Physical Exam  GENERAL:  50 y.o.-year-old patient lying in the bed with no acute distress.  LUNGS: Normal breath sounds bilaterally, no wheezing, rales, rhonchi. No use of accessory muscles of respiration.  CARDIOVASCULAR: S1, S2 normal. No murmurs, rubs, or gallops.  ABDOMEN: Soft, nontender, nondistended. Bowel sounds present. No organomegaly or mass.  EXTREMITIES: 09/28/2020     NEUROLOGIC: grossly nonfocal PSYCHIATRIC:  patient is alert and oriented x 3.  SKIN: as above  LABORATORY PANEL:  CBC Recent Labs  Lab 09/28/20 0516  WBC 16.4*  HGB 13.0  HCT 37.4*  PLT 211    Chemistries  Recent Labs   Lab 09/28/20 0516  NA 130*  K 3.8  CL 94*  CO2 25  GLUCOSE 432*  BUN 19  CREATININE 1.11  CALCIUM 8.7*  AST 63*  ALT 57*  ALKPHOS 203*  BILITOT 2.5*   Cardiac Enzymes No results for input(s): TROPONINI in the last 168 hours. RADIOLOGY:  MR FOOT RIGHT WO CONTRAST  Result Date: 09/28/2020 CLINICAL DATA:  Foot pain and swelling question of osteomyelitis for EXAM: MRI OF THE RIGHT FOREFOOT WITHOUT CONTRAST TECHNIQUE: Multiplanar, multisequence MR imaging of the right was performed. No intravenous contrast was administered. COMPARISON:  None. FINDINGS: The study is limited due to patient motion. Bones/Joint/Cartilage Increased T2 hyperintense signal seen within the fifth metatarsal head without definite associated T1 hypointensity. There is also increased signal seen at the fifth proximal phalanx. A small joint effusion seen at the fifth MTP joint. Ligaments Suboptimally visualized Muscles and Tendons There is increased signal seen within the muscles surrounding the forefoot. There is limited visualization of the tendons. Soft tissues Area of superficial ulceration with non loculated fluid seen overlying the fifth metatarsal head. There is diffuse subcutaneous edema. IMPRESSION: Area of ulceration with phlegmon seen overlying the fifth metatarsal head. Probable reactive marrow seen throughout the fifth metatarsal head and proximal phalanx. The study is somewhat limited due to patient motion. Electronically Signed   By: Prudencio Pair M.D.   On: 09/28/2020 22:35   DG Foot Complete Right  Result Date: 09/27/2020 CLINICAL DATA:  Fall EXAM: RIGHT FOOT COMPLETE - 3+ VIEW COMPARISON:  None. FINDINGS: Degenerative changes in the tarsal region. No acute bony abnormality. Specifically, no fracture, subluxation, or dislocation. Soft tissues  are intact. IMPRESSION: No acute bony abnormality. Electronically Signed   By: Rolm Baptise M.D.   On: 09/27/2020 19:26   DG MINI C-ARM IMAGE ONLY  Result Date:  09/29/2020 There is no interpretation for this exam.  This order is for images obtained during a surgical procedure.  Please See "Surgeries" Tab for more information regarding the procedure.   ASSESSMENT AND PLAN:  [name] is a 50 y.o. male with medical history significant of CAD, hypertension, hyperlipidemia, diabetes who presents with worsening right foot wound.Patient states that 5 or 6 days ago he fell on getting out of his truck and cut his foot on a piece of metal (alluminum, not rusted) after slipping on some ice.   Sepsis POA due to Cellulitis/Diabetice foot post injury  -- Patient with a history of diabetes appears to be currently poorly controlled versus elevated glucose from ongoing infection with glucose in the ED of 390s. -- Patient came in with tachycardia heart rate above 90 with elevated white count and significant right foot infection. --Sustained a foot wound 2 days ago off a piece of metal from his truck.  Wound has been worsening since that time.  Now with weeping ulcers. --> Does report fevers and chills at home.  > tdap given -  podiatry consult with Dr. Vickki Muff -- given history of tobacco abuse and extent of infection vascular surgery consultation with Dr. Delana Meyer - Continue Vanco, cefepime, Flagyl -  ID consultation with Dr. 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 -- patient only on metformin 1000 milligrams BID -- there seems to be dietary  And f/u noncompliance  -- A1c 11.3 -- will start Lantus 20 units daily and aspart TID -- hold oral home meds  CAD Hypertension Hyperlipidemia - Continue home Coreg, atorvastatin -lisinopril on hold -cont asa  Transaminitis -Unclear etiology at this time - consider USG abdomen   DVT prophylaxis:      Heparin  Code Status:              Full  Family Communication:        patient tells me his family has been updated I do not need to call  anybody Disposition Plan:              Patient is from:                        Home             Anticipated DC to:                   Home             Anticipated DC date:               1 to 5 days   Level of care: Med-Surg Status is: inpatient        TOTAL TIME TAKING CARE OF THIS PATIENT: 25* minutes.  >50% time spent on counselling and coordination of care  Note: This dictation was prepared with Dragon dictation along with smaller phrase technology. Any transcriptional errors that result from this process are unintentional.  Fritzi Mandes M.D    [facility]   CC: Primary care physician; Coral Spikes, DOPatient ID: [name], male   DOB: [birth year], 50 y.o.   MRN: [id number]

## 2020-09-29 NOTE — Progress Notes (Signed)
ID Had fever of 101 last night MRI showed Area of ulceration with phlegmon seen overlying the fifth metatarsal head. Probable reactive marrow seen throughout the fifth metatarsal head and proximal phalanx Underwent I/D of multiple abscesses of the rt foot  o/e Patient Vitals for the past 24 hrs:  BP Temp Temp src Pulse Resp SpO2 Height Weight  09/29/20 1230 123/79 98.5 F (36.9 C) Oral 80 16 90 % - -  09/29/20 1215 118/79 - - 76 (!) 23 92 % - -  09/29/20 1212 118/79 98.1 F (36.7 C) - 76 (!) 24 92 % - -  09/29/20 1200 122/77 - - 75 12 96 % - -  09/29/20 1145 126/81 - - 76 (!) 32 94 % - -  09/29/20 1000 (!) 132/93 98.4 F (36.9 C) Temporal 86 18 96 % 6' 2"  (1.88 m) 104.3 kg  09/29/20 0810 120/67 98.8 F (37.1 C) Oral 81 16 96 % - -  09/29/20 0528 120/64 99.2 F (37.3 C) - 87 18 98 % - -  09/29/20 0043 (!) 114/58 98 F (36.7 C) Oral 91 18 96 % - -  09/28/20 2215 128/75 (!) 101.1 F (38.4 C) - 98 18 91 % - -  09/28/20 1529 132/76 99.6 F (37.6 C) - 94 18 94 % - -   Sleeping post surgery Chest b/l air entry Hss1s2 abd soft Rt foot surgical dressing  Labs CBC Latest Ref Rng & Units 09/28/2020 09/27/2020 01/25/2017  WBC 4.0 - 10.5 K/uL 16.4(H) 18.9(H) 15.3(H)  Hemoglobin 13.0 - 17.0 g/dL 13.0 14.4 16.9  Hematocrit 39.0 - 52.0 % 37.4(L) 40.2 48.0  Platelets 150 - 400 K/uL 211 224 201   CMP Latest Ref Rng & Units 09/28/2020 09/27/2020 01/25/2017  Glucose 70 - 99 mg/dL 432(H) 397(H) 323(H)  BUN 6 - 20 mg/dL 19 18 11   Creatinine 0.61 - 1.24 mg/dL 1.11 0.98 0.71  Sodium 135 - 145 mmol/L 130(L) 132(L) 133(L)  Potassium 3.5 - 5.1 mmol/L 3.8 4.3 3.8  Chloride 98 - 111 mmol/L 94(L) 96(L) 98(L)  CO2 22 - 32 mmol/L 25 25 26   Calcium 8.9 - 10.3 mg/dL 8.7(L) 9.1 9.4  Total Protein 6.5 - 8.1 g/dL 6.4(L) 7.3 8.3(H)  Total Bilirubin 0.3 - 1.2 mg/dL 2.5(H) 2.4(H) 1.0  Alkaline Phos 38 - 126 U/L 203(H) 236(H) 156(H)  AST 15 - 41 U/L 63(H) 68(H) 30  ALT 0 - 44 U/L 57(H) 64(H) 43    Micro BC  2/9- NG 2/11 WC- pending  Impression/recommendation  Rt foot necrotic infection with abscess due to trauma but comploicated by poorly controlled Diabetes MRI showed abscess with reactive edema of the bone Underwent I/D today On zosyn and linezolid Await culture to de-escalate   DM- poorly controlled - currently on insulin  HTN on coreg  Hyperlipidemia on atorvastatin ID will follow him peripherally this weekend. Call if needed

## 2020-09-29 NOTE — Op Note (Signed)
Operative note   Surgeon:Reilley Valentine Lawyer: None    Preop diagnosis: Severe abscess right foot    Postop diagnosis: Same    Procedure: Incision and drainage multiple areas lateral and plantar right foot    EBL: Minimal    Anesthesia:local and general    Hemostasis: Mid calf tourniquet inflated to 200 mmHg for 18 minutes    Specimen: Deep wound culture    Complications: None    Operative indications:[name] is an 50 y.o. that presents today for surgical intervention.  The risks/benefits/alternatives/complications have been discussed and consent has been given.    Procedure:  Patient was brought into the OR and placed on the operating table in the supine position. After anesthesia was obtained theright lower extremity was prepped and draped in usual sterile fashion.  Attention was directed to the distal lateral right foot where severe erythema and obvious purulence was noted to the lateral fifth metatarsal region.  A longitudinal incision was performed laterally.  Noted purulence was found immediately.  The dorsal aspect of the foot was bluntly dissected.  Severe purulence was noted to this area.  The dorsal skin and the dorsal midfoot had a superficial area necrosis beginning secondary to the infection.  Plantar aspect of the right midfoot showed a abscessed area.  Sharp incision was made into this region.  Blunt dissection was carried down through the fascial layer.  Purulence was noted extending into the arch.  This was bluntly dissected as well.  All purulence was expressed at this time.  Next all areas were then flushed with copious amounts of irrigation using a pulse lavage and bulb syringe.  At this time the wounds were then packed with iodoform packing.  The lateral incision was loosely closed with a 2-0 nylon.  A large bulky bandage was placed to the entire right foot.    Patient tolerated the procedure and anesthesia well.  Was transported from the OR to the PACU  with all vital signs stable and vascular status intact. To be discharged per routine protocol.  Will follow up in approximately 1 week in the outpatient clinic.

## 2020-09-29 NOTE — Discharge Instructions (Signed)
Fingerstick glucose (sugar) goals for home: Before meals: 80-130 mg/dl 2-Hours after meals: less than 180 mg/dl Hemoglobin A1c goal: 7% or less   Insulin Pen Instructions:  1. Remove Insulin pen cap and clean pen 1st with alcohol rub and then clean skin 2nd with alcohol rub 2. Twist insulin pen needle onto pen (right tighty) 3. Remove outer cap and inner cap from needle 4. Dial pen to 2 units and perform prime- press pen to zero and make sure liquid (insulin) comes out of the needle 5. Dial pen to your dose and perform injection into your abdomen 6. Hold needle in skin for 10 seconds after injection 7. Remove needle from insulin pen and discard 8. Place cap back on insulin pen and store safely (at room temperature) 9. Store unused pens in refrigerator and can keep opened insulin pen at room temperature (discard used pen after 30 days)

## 2020-09-30 ENCOUNTER — Inpatient Hospital Stay: Payer: Self-pay

## 2020-09-30 ENCOUNTER — Encounter: Payer: Self-pay | Admitting: Internal Medicine

## 2020-09-30 DIAGNOSIS — E1169 Type 2 diabetes mellitus with other specified complication: Secondary | ICD-10-CM

## 2020-09-30 DIAGNOSIS — K8020 Calculus of gallbladder without cholecystitis without obstruction: Secondary | ICD-10-CM

## 2020-09-30 DIAGNOSIS — S99921A Unspecified injury of right foot, initial encounter: Secondary | ICD-10-CM

## 2020-09-30 LAB — COMPREHENSIVE METABOLIC PANEL
ALT: 46 U/L — ABNORMAL HIGH (ref 0–44)
AST: 54 U/L — ABNORMAL HIGH (ref 15–41)
Albumin: 1.9 g/dL — ABNORMAL LOW (ref 3.5–5.0)
Alkaline Phosphatase: 199 U/L — ABNORMAL HIGH (ref 38–126)
Anion gap: 10 (ref 5–15)
BUN: 16 mg/dL (ref 6–20)
CO2: 27 mmol/L (ref 22–32)
Calcium: 8.2 mg/dL — ABNORMAL LOW (ref 8.9–10.3)
Chloride: 93 mmol/L — ABNORMAL LOW (ref 98–111)
Creatinine, Ser: 1.13 mg/dL (ref 0.61–1.24)
GFR, Estimated: >60 mL/min (ref >60)
Glucose, Bld: 257 mg/dL — ABNORMAL HIGH (ref 70–99)
Potassium: 3.9 mmol/L (ref 3.5–5.1)
Sodium: 130 mmol/L — ABNORMAL LOW (ref 135–145)
Total Bilirubin: 2.1 mg/dL — ABNORMAL HIGH (ref 0.3–1.2)
Total Protein: 6.6 g/dL (ref 6.5–8.1)

## 2020-09-30 LAB — CBC
HCT: 36.3 % — ABNORMAL LOW (ref 39.0–52.0)
Hemoglobin: 12.8 g/dL — ABNORMAL LOW (ref 13.0–17.0)
MCH: 33.6 pg (ref 26.0–34.0)
MCHC: 35.3 g/dL (ref 30.0–36.0)
MCV: 95.3 fL (ref 80.0–100.0)
Platelets: 197 10*3/uL (ref 150–400)
RBC: 3.81 MIL/uL — ABNORMAL LOW (ref 4.22–5.81)
RDW: 12.3 % (ref 11.5–15.5)
WBC: 14.3 10*3/uL — ABNORMAL HIGH (ref 4.0–10.5)
nRBC: 0 % (ref 0.0–0.2)

## 2020-09-30 LAB — PROTIME-INR
INR: 1.2 (ref 0.8–1.2)
Prothrombin Time: 15 seconds (ref 11.4–15.2)

## 2020-09-30 LAB — GLUCOSE, CAPILLARY
Glucose-Capillary: 216 mg/dL — ABNORMAL HIGH (ref 70–99)
Glucose-Capillary: 215 mg/dL — ABNORMAL HIGH (ref 70–99)
Glucose-Capillary: 160 mg/dL — ABNORMAL HIGH (ref 70–99)
Glucose-Capillary: 148 mg/dL — ABNORMAL HIGH (ref 70–99)

## 2020-09-30 LAB — HEPATITIS PANEL, ACUTE
HCV Ab: NONREACTIVE
Hep A IgM: NONREACTIVE
Hep B C IgM: NONREACTIVE
Hepatitis B Surface Ag: NONREACTIVE

## 2020-09-30 MED ORDER — LISINOPRIL 5 MG PO TABS
5.0000 mg | ORAL_TABLET | Freq: Every day | ORAL | Status: DC
  Administered 2020-09-30 – 2020-10-14 (×13): 5 mg via ORAL
  Filled 2020-09-30 (×13): qty 1

## 2020-09-30 MED ORDER — IOHEXOL 9 MG/ML PO SOLN
500.0000 mL | ORAL | Status: AC
  Administered 2020-09-30 (×2): 500 mL via ORAL

## 2020-09-30 MED ORDER — IOHEXOL 300 MG/ML  SOLN
125.0000 mL | Freq: Once | INTRAMUSCULAR | Status: AC | PRN
  Administered 2020-09-30: 125 mL via INTRAVENOUS

## 2020-09-30 MED ORDER — INSULIN GLARGINE 100 UNIT/ML ~~LOC~~ SOLN
30.0000 [IU] | Freq: Every day | SUBCUTANEOUS | Status: DC
  Administered 2020-09-30 – 2020-10-14 (×15): 30 [IU] via SUBCUTANEOUS
  Filled 2020-09-30 (×18): qty 0.3

## 2020-09-30 MED ORDER — GABAPENTIN 300 MG PO CAPS
600.0000 mg | ORAL_CAPSULE | Freq: Every day | ORAL | Status: DC
  Administered 2020-09-30 – 2020-10-13 (×14): 600 mg via ORAL
  Filled 2020-09-30 (×14): qty 2

## 2020-09-30 NOTE — Consult Note (Signed)
Patient ID: [name], male   DOB: 1971/06/30, 50 y.o.   MRN: [id number]  HPI [name] is a 50 y.o. male seen in consultation at the request of Dr. Posey Pronto.  He was admitted a few days ago due to a right diabetic foot and sepsis.  He has been started on broad-spectrum antibiotics IV fluids and had an I&D of the right foot performed by podiatry. He does have significant history of hypertension hyperlipidemia poorly controlled diabetes and coronary artery disease.  Apparently a few days ago he had an injury to his right foot.  He did report fevers when he was at home and chills as well.  He does exhibit some mild pain to the right foot. Part of the inpatient work-up a CMP was obtained showing evidence of increase in alkaline phosphatase AST and ALT as well as a total bilirubin.  This prompted right upper quadrant ultrasound that have personally reviewed showing evidence of cholelithiasis no definitive evidence of masses or cirrhosis.  Some fluid around the gallbladder wall and there is evidence of gallstones.  No evidence of common bile duct dilation Repeat labs this morning show a bilirubin of 2.1 alkaline phosphatase one ninety-nine and mild elevation of the AST and ALT.  His albumin is 1.9. He reports no abdominal pain no nausea no vomiting.  He is hungry  HPI  Past Medical History:  Diagnosis Date  . Diabetes mellitus without complication ([place])   . Heart disease   . Myocardial infarction [facility])     Past Surgical History:  Procedure Laterality Date  . BACK SURGERY      Family History  Problem Relation Age of Onset  . Diabetes Father   . Heart disease Father   . Breast cancer Mother   . Lung cancer Paternal Grandmother     Social History Social History   Tobacco Use  . Smoking status: Current Every Day Smoker    Packs/day: 1.00    Types: Cigarettes  . Smokeless tobacco: Never Used  Substance Use Topics  . Alcohol use: Yes    Alcohol/week: 0.0 - 1.0 standard drinks  .  Drug use: No    No Known Allergies  Current Facility-Administered Medications  Medication Dose Route Frequency Provider Last Rate Last Admin  . acetaminophen (TYLENOL) tablet 650 mg  650 mg Oral Q6H PRN Samara Deist, DPM   650 mg at 09/30/20 5732   Or  . acetaminophen (TYLENOL) suppository 650 mg  650 mg Rectal Q6H PRN Samara Deist, DPM      . aspirin EC tablet 81 mg  81 mg Oral Daily Samara Deist, DPM   81 mg at 09/30/20 0856  . atorvastatin (LIPITOR) tablet 40 mg  40 mg Oral Daily Samara Deist, DPM   40 mg at 09/30/20 0856  . carvedilol (COREG) tablet 3.125 mg  3.125 mg Oral BID WC Samara Deist, DPM   3.125 mg at 09/30/20 0856  . gabapentin (NEURONTIN) capsule 600 mg  600 mg Oral QHS Fritzi Mandes, Gutierrez      . heparin injection 5,000 Units  5,000 Units Subcutaneous Q8H Samara Deist, DPM   5,000 Units at 09/30/20 [phone]  . HYDROcodone-acetaminophen (NORCO/VICODIN) 5-325 MG per tablet 1-2 tablet  1-2 tablet Oral Q6H PRN Samara Deist, DPM   1 tablet at 09/30/20 0856  . insulin aspart (novoLOG) injection 0-15 Units  0-15 Units Subcutaneous TID WC Samara Deist, DPM   5 Units at 09/30/20 [phone]  . insulin aspart (novoLOG) injection  0-5 Units  0-5 Units Subcutaneous QHS Samara Deist, DPM   2 Units at 09/29/20 2222  . insulin aspart (novoLOG) injection 8 Units  8 Units Subcutaneous TID WC Samara Deist, DPM   8 Units at 09/30/20 0857  . insulin glargine (LANTUS) injection 30 Units  30 Units Subcutaneous Daily Fritzi Mandes, Gutierrez   30 Units at 09/30/20 1056  . iohexol (OMNIPAQUE) 9 MG/ML oral solution 500 mL  500 mL Oral Q1 Hr x 2 [name]      . linezolid (ZYVOX) IVPB 600 mg  600 mg Intravenous Q12H Samara Deist, DPM 300 mL/hr at 09/30/20 0901 600 mg at 09/30/20 0901  . lisinopril (ZESTRIL) tablet 5 mg  5 mg Oral Daily Fritzi Mandes, Gutierrez   5 mg at 09/30/20 0856  . ondansetron (ZOFRAN) injection 4 mg  4 mg Intravenous Q6H PRN Samara Deist, DPM   4 mg at 09/29/20 2037  .  piperacillin-tazobactam (ZOSYN) IVPB 3.375 g  3.375 g Intravenous Q8H Samara Deist, DPM 12.5 mL/hr at 09/30/20 1055 3.375 g at 09/30/20 1055  . polyethylene glycol (MIRALAX / GLYCOLAX) packet 17 g  17 g Oral Daily PRN Samara Deist, DPM      . sodium chloride flush (NS) 0.9 % injection 3 mL  3 mL Intravenous Q12H Samara Deist, DPM   3 mL at 09/30/20 0160     Review of Systems Full ROS  was asked and was negative except for the information on the HPI  Physical Exam Blood pressure 109/68, pulse 76, temperature 98.3 F (36.8 C), resp. rate 17, height 6' 2"  (1.88 m), weight 104.3 kg, SpO2 95 %. CONSTITUTIONAL: NAD EYES: Pupils are equal, round,  Sclera are non-icteric. EARS, NOSE, MOUTH AND THROAT: He is wearing a mask. Hearing is intact to voice. LYMPH NODES:  Lymph nodes in the neck are normal. RESPIRATORY:  Lungs are clear. There is normal respiratory effort, with equal breath sounds bilaterally, and without pathologic use of accessory muscles. CARDIOVASCULAR: Heart is regular without murmurs, gallops, or rubs. GI: The abdomen is  soft, nontender, and nondistended. There are no palpable masses. There is no hepatosplenomegaly. There are normal bowel sounds in all quadrants. GU: Rectal deferred.   MUSCULOSKELETAL: Erythema to the right foot.it is covered by a wrap and podiatry is following, pictures definitely show significant ulceration and dorsal aspect with blisters.  Palpable pedal pulses SKIN: Turgor is good and there are no pathologic skin lesions or ulcers. NEUROLOGIC: Motor and sensation is grossly normal. Cranial nerves are grossly intact. PSYCH:  Oriented to person, place and time. Affect is normal.  Data Reviewed  I have personally reviewed the patient's imaging, laboratory findings and medical records.    Assessment/Plan 50 year old male admitted for diabetic foot and sepsis.  He did have significant cholestasis that prompted further work-up to include a right upper  quadrant ultrasound showing evidence of gallstones and questionable edema.  Clinically the patient does not have any signs or symptoms consistent with cholecystitis.  This can certainly be hepatitis or a static process from sepsis.  Given the equivocal findings I will start work-up with repeating a CMP, INR and CBC as well as obtain a CT scan of the abdomen and pelvis with contrast to help [place] an idea of his liver function and reserve and about potential gallbladder disease. His main problem is his sepsis and his foot and this will need to be addressed before any potential elective intervention is attempted.  I will continue to  follow him and review his studies.  Discussed with Dr. Posey Pronto in detail  Caroleen Hamman, Gutierrez [facility] Surgeon 09/30/2020, 12:08 PM

## 2020-09-30 NOTE — Progress Notes (Signed)
[facility] NAME: [name]    MR#:  [id number]  DATE OF BIRTH:  1971-07-30  SUBJECTIVE:  feels better after surgery. Less pain. Denies any abdominal pain. Tolerating diet.  Tolerated surgery right foot well.  REVIEW OF SYSTEMS:   Review of Systems  Constitutional: Positive for chills. Negative for fever and weight loss.  HENT: Negative for ear discharge, ear pain and nosebleeds.   Eyes: Negative for blurred vision, pain and discharge.  Respiratory: Negative for sputum production, shortness of breath, wheezing and stridor.   Cardiovascular: Negative for chest pain, palpitations, orthopnea and PND.  Gastrointestinal: Positive for nausea. Negative for abdominal pain, diarrhea and vomiting.  Genitourinary: Negative for frequency and urgency.  Musculoskeletal: Positive for joint pain. Negative for back pain.  Neurological: Negative for sensory change, speech change, focal weakness and weakness.  Psychiatric/Behavioral: Negative for depression and hallucinations. The patient is not nervous/anxious.    Tolerating Diet:yes Tolerating PT:   DRUG ALLERGIES:  No Known Allergies  VITALS:  Blood pressure 117/68, pulse 73, temperature 98.2 F (36.8 C), resp. rate 16, height 6' 2"  (1.88 m), weight 104.3 kg, SpO2 98 %.  PHYSICAL EXAMINATION:   Physical Exam  GENERAL:  50 y.o.-year-old patient lying in the bed with no acute distress.  LUNGS: Normal breath sounds bilaterally, no wheezing, rales, rhonchi. No use of accessory muscles of respiration.  CARDIOVASCULAR: S1, S2 normal. No murmurs, rubs, or gallops.  ABDOMEN: Soft, nontender, nondistended. Bowel sounds present. No organomegaly or mass.  EXTREMITIES: 09/28/2020     NEUROLOGIC: grossly nonfocal PSYCHIATRIC:  patient is alert and oriented x 3.  SKIN: as above  LABORATORY PANEL:  CBC Recent Labs  Lab 09/30/20 1043  WBC 14.3*  HGB 12.8*  HCT 36.3*  PLT 197     Chemistries  Recent Labs  Lab 09/30/20 1043  NA 130*  K 3.9  CL 93*  CO2 27  GLUCOSE 257*  BUN 16  CREATININE 1.13  CALCIUM 8.2*  AST 54*  ALT 46*  ALKPHOS 199*  BILITOT 2.1*   Cardiac Enzymes No results for input(s): TROPONINI in the last 168 hours. RADIOLOGY:  MR FOOT RIGHT WO CONTRAST  Result Date: 09/28/2020 CLINICAL DATA:  Foot pain and swelling question of osteomyelitis for EXAM: MRI OF THE RIGHT FOREFOOT WITHOUT CONTRAST TECHNIQUE: Multiplanar, multisequence MR imaging of the right was performed. No intravenous contrast was administered. COMPARISON:  None. FINDINGS: The study is limited due to patient motion. Bones/Joint/Cartilage Increased T2 hyperintense signal seen within the fifth metatarsal head without definite associated T1 hypointensity. There is also increased signal seen at the fifth proximal phalanx. A small joint effusion seen at the fifth MTP joint. Ligaments Suboptimally visualized Muscles and Tendons There is increased signal seen within the muscles surrounding the forefoot. There is limited visualization of the tendons. Soft tissues Area of superficial ulceration with non loculated fluid seen overlying the fifth metatarsal head. There is diffuse subcutaneous edema. IMPRESSION: Area of ulceration with phlegmon seen overlying the fifth metatarsal head. Probable reactive marrow seen throughout the fifth metatarsal head and proximal phalanx. The study is somewhat limited due to patient motion. Electronically Signed   By: Prudencio Pair M.D.   On: 09/28/2020 22:35   DG MINI C-ARM IMAGE ONLY  Result Date: 09/29/2020 There is no interpretation for this exam.  This order is for images obtained during a surgical procedure.  Please See "Surgeries" Tab for more information regarding the procedure.  US Abdomen Limited RUQ (LIVER/GB)  Addendum Date: 09/30/2020   ADDENDUM REPORT: 09/30/2020 10:07 ADDENDUM: Study discussed by telephone with Dr. Fritzi Mandes on 09/30/2020 at  1000 hours. Electronically Signed   By: Genevie Ann M.D.   On: 09/30/2020 10:07   Result Date: 09/30/2020 CLINICAL DATA:  50 year old male with abnormal transaminases. EXAM: ULTRASOUND ABDOMEN LIMITED RIGHT UPPER QUADRANT COMPARISON:  CT Abdomen and Pelvis 01/25/2017. FINDINGS: Gallbladder: Abnormal gallbladder wall thickening and/or edema with trace pericholecystic fluid (image 2). Combined the abnormal wall thickness is 5 mm or greater. Only occasional small gallstones are suspected, such as on image 15 measuring roughly 8 mm. Positive sonographic Murphy sign. Common bile duct: Diameter: 4 mm, normal. Liver: No intrahepatic biliary ductal dilatation identified. No discrete liver lesion. Background liver echogenicity within normal limits (images 32, 35). Questionable nodular liver contour (images 38, 41). Portal vein is patent on color Doppler imaging with normal direction of blood flow towards the liver. Other: Negative visible right kidney. IMPRESSION: 1. Generalized gallbladder edema and positive sonographic Murphy sign. Only occasional small gallstones identified. In this setting differential considerations include gallbladder edema secondary to cirrhosis/hepatitis (see #2) versus Acute Calculus or Acalculous Cholecystitis. Follow-up nuclear medicine hepatobiliary scan may be valuable to evaluate patency of the cystic duct. 2. Mildly nodular liver contour raising the possibility of cirrhosis. No discrete liver lesion. 3. No evidence of biliary duct obstruction. Electronically Signed: By: Genevie Ann M.D. On: 09/30/2020 09:09   ASSESSMENT AND PLAN:  [name] is a 50 y.o. male with medical history significant of CAD, hypertension, hyperlipidemia, diabetes who presents with worsening right foot wound.Patient states that 5 or 6 days ago he fell on getting out of his truck and cut his foot on a piece of metal (alluminum, not rusted) after slipping on some ice.   Sepsis POA due to Cellulitis/Diabetice foot  post injury --sepsis improved -- Patient with a history of diabetes appears to be currently poorly controlled versus elevated glucose from ongoing infection with glucose in the ED of 390s. -- Patient came in with tachycardia heart rate above 90 with elevated white count and significant right foot infection. --Sustained a foot wound 2 days ago off a piece of metal from his truck.  Wound has been worsening since that time.  Now with weeping ulcers. > tdap given -  podiatry consult with Dr. Vickki Muff s/p debridement of multiple abscesses on the right foot.  -- Continue Zosyn and Linezolid -  ID consultation with Dr. Steva Ready appreciated - Follow-up  blood culture negative -[facility] -- vascular consultation appreciated.  Diabetes Type 2 uncontrolled with Diabetic foot -- patient only on metformin 1000 milligrams BID -- there seems to be dietary  And f/u noncompliance  -- A1c 11.3 -- will start Lantus 30 units daily and aspart  8 TID -- hold oral home meds  CAD Hypertension Hyperlipidemia - Continue home Coreg, atorvastatin -lisinopril on hold -cont asa  Transaminitis abnormal gallbladder ultrasound with gallstones and findings suggestive of cholecystitis -surgical consultation with Dr. Dahlia Byes. Recommends follow-up labs and CT abdomen pelvis. Patient is asymptomatic. Continue to monitor.  DVT prophylaxis:      Heparin  Code Status:              Full  Family Communication:        patient tells me his family has been updated I do not need to call anybody Disposition Plan:  Patient is from:                        Home             Anticipated DC to:                   Home             Anticipated DC date:               1 to 5 days   Level of care: Med-Surg Status is: inpatient  Patient needs vascular consultation to evaluate peripheral vascular disease. Surgical consultation for abnormal ultrasound abdomen. Continue monitoring right foot per podiatry. May need further  surgery.      TOTAL TIME TAKING CARE OF THIS PATIENT: 25* minutes.  >50% time spent on counselling and coordination of care  Note: This dictation was prepared with Dragon dictation along with smaller phrase technology. Any transcriptional errors that result from this process are unintentional.  Fritzi Mandes M.D    [facility]   CC: Primary care physician; Coral Spikes, DOPatient ID: [name], male   DOB: Dec 15, 1970, 50 y.o.   MRN: [id number]

## 2020-09-30 NOTE — Progress Notes (Addendum)
Daily Progress Note   Subjective  - 1 Day Post-Op  Status post I&D lateral and plantar aspect of the right foot. No complaints of pain at this time.  Objective Vitals:   09/29/20 2022 09/29/20 2325 09/30/20 0535 09/30/20 0804  BP: 120/72 120/77 132/79 109/68  Pulse: 93 87 82 76  Resp: 16 16 17 17   Temp: 98.6 F (37 C) 100.2 F (37.9 C) 98.6 F (37 C) 98.3 F (36.8 C)  TempSrc:      SpO2: 96% 93% 93% 95%  Weight:      Height:        Physical Exam: Still severe erythema to the right foot. Area marked. The lymphangitic streak on the anterior aspect of the ankle has dissipated.  Small area of necrosis into the mid arch area to the right foot. The fifth toe is dusky. The entire lateral fifth MTPJ has necrosis as does the dorsal distal lateral aspect along the fifth ray and part of the fourth ray.  He does have a strongly palpable DP pulse but he also has areas of superficial dry ulcerations to the lesser toes as well as into the right and left legs. I suspect severe peripheral vascular disease.  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 I suspect this is likely secondary to infection but may need further vascular work-up given the chronic ulcerations that he has.  Vascular consult placed.  At this  time will allow further demarcation to the area. There was no active purulent drainage today.  I discussed with the patient likely outcome will be at least transmetatarsal amputation.  Will await vascular recommendations and further demarcation at this time.  We will order dressing changes for now. Nursing was performed by myself today.  Culture still pending but gram-positive cocci noted.  White blood cell count slowly trending down. Blood culture so far negative.  03/27/2017 A  09/30/2020, 11:06 AM

## 2020-10-01 ENCOUNTER — Encounter
Admission: EM | Disposition: A | Discharge status: Home Health | Payer: Self-pay | Source: Home / Self Care | Attending: Internal Medicine

## 2020-10-01 ENCOUNTER — Inpatient Hospital Stay: Payer: Self-pay | Admitting: Anesthesiology

## 2020-10-01 DIAGNOSIS — L03115 Cellulitis of right lower limb: Secondary | ICD-10-CM

## 2020-10-01 HISTORY — PX: AMPUTATION: SHX166

## 2020-10-01 LAB — CBC
HCT: 37.2 % — ABNORMAL LOW (ref 39.0–52.0)
Hemoglobin: 12.8 g/dL — ABNORMAL LOW (ref 13.0–17.0)
MCH: 32.5 pg (ref 26.0–34.0)
MCHC: 34.4 g/dL (ref 30.0–36.0)
MCV: 94.4 fL (ref 80.0–100.0)
Platelets: 232 10*3/uL (ref 150–400)
RBC: 3.94 MIL/uL — ABNORMAL LOW (ref 4.22–5.81)
RDW: 12.2 % (ref 11.5–15.5)
WBC: 13.3 10*3/uL — ABNORMAL HIGH (ref 4.0–10.5)
nRBC: 0 % (ref 0.0–0.2)

## 2020-10-01 LAB — GLUCOSE, CAPILLARY
Glucose-Capillary: 123 mg/dL — ABNORMAL HIGH (ref 70–99)
Glucose-Capillary: 198 mg/dL — ABNORMAL HIGH (ref 70–99)
Glucose-Capillary: 102 mg/dL — ABNORMAL HIGH (ref 70–99)
Glucose-Capillary: 69 mg/dL — ABNORMAL LOW (ref 70–99)
Glucose-Capillary: 71 mg/dL (ref 70–99)
Glucose-Capillary: 91 mg/dL (ref 70–99)

## 2020-10-01 SURGERY — AMPUTATION, FOOT, RAY
Anesthesia: General | Site: Foot | Laterality: Right

## 2020-10-01 MED ORDER — FENTANYL CITRATE (PF) 100 MCG/2ML IJ SOLN
INTRAMUSCULAR | Status: DC | PRN
  Administered 2020-10-01 (×2): 50 ug via INTRAVENOUS

## 2020-10-01 MED ORDER — ONDANSETRON HCL 4 MG/2ML IJ SOLN
INTRAMUSCULAR | Status: DC | PRN
  Administered 2020-10-01: 4 mg via INTRAVENOUS

## 2020-10-01 MED ORDER — SODIUM CHLORIDE 0.9 % IV SOLN: INTRAVENOUS | Status: DC | PRN

## 2020-10-01 MED ORDER — ACETAMINOPHEN 10 MG/ML IV SOLN
INTRAVENOUS | Status: AC
  Filled 2020-10-01: qty 100

## 2020-10-01 MED ORDER — EPHEDRINE 5 MG/ML INJ
INTRAVENOUS | Status: AC
  Filled 2020-10-01: qty 10

## 2020-10-01 MED ORDER — LIDOCAINE HCL (CARDIAC) PF 100 MG/5ML IV SOSY
PREFILLED_SYRINGE | INTRAVENOUS | Status: DC | PRN
  Administered 2020-10-01: 100 mg via INTRAVENOUS

## 2020-10-01 MED ORDER — FENTANYL CITRATE (PF) 100 MCG/2ML IJ SOLN
INTRAMUSCULAR | Status: AC
  Filled 2020-10-01: qty 2

## 2020-10-01 MED ORDER — ACETAMINOPHEN 10 MG/ML IV SOLN
INTRAVENOUS | Status: DC | PRN
  Administered 2020-10-01: 1000 mg via INTRAVENOUS

## 2020-10-01 MED ORDER — PROPOFOL 10 MG/ML IV BOLUS
INTRAVENOUS | Status: DC | PRN
  Administered 2020-10-01: 160 mg via INTRAVENOUS

## 2020-10-01 MED ORDER — EPHEDRINE SULFATE 50 MG/ML IJ SOLN
INTRAMUSCULAR | Status: DC | PRN
  Administered 2020-10-01 (×2): 5 mg via INTRAVENOUS
  Administered 2020-10-01: 10 mg via INTRAVENOUS
  Administered 2020-10-01: 5 mg via INTRAVENOUS

## 2020-10-01 MED ORDER — INSULIN ASPART 100 UNIT/ML ~~LOC~~ SOLN
12.0000 [IU] | Freq: Three times a day (TID) | SUBCUTANEOUS | Status: DC
  Administered 2020-10-02 (×2): 12 [IU] via SUBCUTANEOUS
  Filled 2020-10-01 (×2): qty 1

## 2020-10-01 MED ORDER — ONDANSETRON HCL 4 MG/2ML IJ SOLN
INTRAMUSCULAR | Status: AC
  Filled 2020-10-01: qty 2

## 2020-10-01 MED ORDER — MIDAZOLAM HCL 2 MG/2ML IJ SOLN
INTRAMUSCULAR | Status: AC
  Filled 2020-10-01: qty 2

## 2020-10-01 MED ORDER — PROPOFOL 10 MG/ML IV BOLUS
INTRAVENOUS | Status: AC
  Filled 2020-10-01: qty 20

## 2020-10-01 MED ORDER — MIDAZOLAM HCL 2 MG/2ML IJ SOLN
INTRAMUSCULAR | Status: DC | PRN
  Administered 2020-10-01: 2 mg via INTRAVENOUS

## 2020-10-01 MED ORDER — SEVOFLURANE IN SOLN
RESPIRATORY_TRACT | Status: AC
  Filled 2020-10-01: qty 250

## 2020-10-01 MED ORDER — LIDOCAINE HCL (PF) 2 % IJ SOLN
INTRAMUSCULAR | Status: AC
  Filled 2020-10-01: qty 5

## 2020-10-01 SURGICAL SUPPLY — 51 items
BLADE OSC/SAGITTAL MD 5.5X18 (BLADE) ×2 IMPLANT
BLADE OSC/SAGITTAL MD 9X18.5 (BLADE) ×1 IMPLANT
BLADE SURG MINI STRL (BLADE) ×2 IMPLANT
BNDG CMPR STD VLCR NS LF 5.8X4 (GAUZE/BANDAGES/DRESSINGS) ×1
BNDG CONFORM 2 STRL LF (GAUZE/BANDAGES/DRESSINGS) ×2 IMPLANT
BNDG CONFORM 3 STRL LF (GAUZE/BANDAGES/DRESSINGS) ×4 IMPLANT
BNDG ELASTIC 4X5.8 VLCR NS LF (GAUZE/BANDAGES/DRESSINGS) ×2 IMPLANT
BNDG ESMARK 4X12 TAN STRL LF (GAUZE/BANDAGES/DRESSINGS) ×2 IMPLANT
BNDG GAUZE 4.5X4.1 6PLY STRL (MISCELLANEOUS) ×2 IMPLANT
CANISTER SUCT 1200ML W/VALVE (MISCELLANEOUS) ×2 IMPLANT
COVER WAND RF STERILE (DRAPES) ×2 IMPLANT
CUFF TOURN SGL QUICK 12 (TOURNIQUET CUFF) IMPLANT
CUFF TOURN SGL QUICK 18X4 (TOURNIQUET CUFF) IMPLANT
DRAPE FLUOR MINI C-ARM 54X84 (DRAPES) ×2 IMPLANT
DRAPE XRAY CASSETTE 23X24 (DRAPES) ×2 IMPLANT
DURAPREP 26ML APPLICATOR (WOUND CARE) ×2 IMPLANT
ELECT REM PT RETURN 9FT ADLT (ELECTROSURGICAL) ×2
ELECTRODE REM PT RTRN 9FT ADLT (ELECTROSURGICAL) ×1 IMPLANT
GAUZE PACKING IODOFORM 1/2 (PACKING) ×2 IMPLANT
GAUZE SPONGE 4X4 12PLY STRL (GAUZE/BANDAGES/DRESSINGS) ×2 IMPLANT
GAUZE XEROFORM 1X8 LF (GAUZE/BANDAGES/DRESSINGS) ×2 IMPLANT
GLOVE INDICATOR 8.0 STRL GRN (GLOVE) ×2 IMPLANT
GLOVE SURG ENC MOIS LTX SZ7.5 (GLOVE) ×2 IMPLANT
GOWN STRL REUS W/ TWL XL LVL3 (GOWN DISPOSABLE) ×2 IMPLANT
GOWN STRL REUS W/TWL XL LVL3 (GOWN DISPOSABLE) ×4
KIT DRSG VAC SLVR GRANUFM (MISCELLANEOUS) ×1 IMPLANT
KIT STIMULAN RAPID CURE 5CC (Orthopedic Implant) ×1 IMPLANT
KIT TURNOVER KIT A (KITS) ×2 IMPLANT
LABEL OR SOLS (LABEL) ×2 IMPLANT
MANIFOLD NEPTUNE II (INSTRUMENTS) ×2 IMPLANT
NDL FILTER BLUNT 18X1 1/2 (NEEDLE) ×1 IMPLANT
NDL HYPO 25X1 1.5 SAFETY (NEEDLE) ×1 IMPLANT
NEEDLE FILTER BLUNT 18X 1/2SAF (NEEDLE) ×1
NEEDLE FILTER BLUNT 18X1 1/2 (NEEDLE) ×1 IMPLANT
NEEDLE HYPO 25X1 1.5 SAFETY (NEEDLE) ×2 IMPLANT
NS IRRIG 500ML POUR BTL (IV SOLUTION) ×2 IMPLANT
PACK EXTREMITY ARMC (MISCELLANEOUS) ×2 IMPLANT
PAD ABD DERMACEA PRESS 5X9 (GAUZE/BANDAGES/DRESSINGS) ×4 IMPLANT
PULSAVAC PLUS IRRIG FAN TIP (DISPOSABLE) ×2
SHIELD FULL FACE ANTIFOG 7M (MISCELLANEOUS) ×2 IMPLANT
SOL .9 NS 3000ML IRR  AL (IV SOLUTION) ×2
SOL .9 NS 3000ML IRR AL (IV SOLUTION) ×1
SOL .9 NS 3000ML IRR UROMATIC (IV SOLUTION) ×1 IMPLANT
STOCKINETTE M/LG 89821 (MISCELLANEOUS) ×2 IMPLANT
STRAP SAFETY 5IN WIDE (MISCELLANEOUS) ×2 IMPLANT
SUT ETHILON 3-0 FS-10 30 BLK (SUTURE) ×2
SUT ETHILON 5-0 FS-2 18 BLK (SUTURE) ×2 IMPLANT
SUT VIC AB 4-0 FS2 27 (SUTURE) ×2 IMPLANT
SUTURE EHLN 3-0 FS-10 30 BLK (SUTURE) ×1 IMPLANT
SYR 10ML LL (SYRINGE) ×6 IMPLANT
TIP FAN IRRIG PULSAVAC PLUS (DISPOSABLE) ×1 IMPLANT

## 2020-10-01 NOTE — Progress Notes (Signed)
Dressing change done at this time with MD present. Pt did not want the dressing changed earlier in the day. Pt had some discomfort with the dressing change and touching of the foot. Wounds had serous fluid, some blood, and some pus draining. Pt did not want medications prior to change, but did take some afterwards. Redness marked from yesterday dressing change with MD, redness does not appear to have increased.

## 2020-10-01 NOTE — Anesthesia Procedure Notes (Signed)
Procedure Name: LMA Insertion Date/Time: 10/01/2020 8:00 PM Performed by: Aline Brochure, CRNA Pre-anesthesia Checklist: Patient identified, Emergency Drugs available, Suction available and Patient being monitored Patient Re-evaluated:Patient Re-evaluated prior to induction Oxygen Delivery Method: Circle system utilized Preoxygenation: Pre-oxygenation with 100% oxygen Induction Type: IV induction Ventilation: Mask ventilation without difficulty LMA: LMA inserted LMA Size: 4.5 Number of attempts: 1 Placement Confirmation: positive ETCO2 and breath sounds checked- equal and bilateral Tube secured with: Tape Dental Injury: Teeth and Oropharynx as per pre-operative assessment

## 2020-10-01 NOTE — Anesthesia Preprocedure Evaluation (Signed)
Anesthesia Evaluation  Patient identified by MRN, date of birth, ID band Patient awake    Reviewed: Allergy & Precautions, H&P , NPO status , Patient's Chart, lab work & pertinent test results  History of Anesthesia Complications Negative for: history of anesthetic complications  Airway Mallampati: III   Neck ROM: full  Mouth opening: Limited Mouth Opening Comment: Large beard  Dental  (+) Edentulous Upper, Edentulous Lower   Pulmonary neg sleep apnea, neg COPD, Current Smoker and Patient abstained from smoking.,           Cardiovascular hypertension, (-) angina+ CAD and + Past MI  (-) Cardiac Stents (-) dysrhythmias      Neuro/Psych negative neurological ROS  negative psych ROS   GI/Hepatic negative GI ROS, Neg liver ROS,   Endo/Other  diabetes, Poorly Controlled  Renal/GU      Musculoskeletal   Abdominal   Peds  Hematology negative hematology ROS (+)   Anesthesia Other Findings   Reproductive/Obstetrics negative OB ROS                             Anesthesia Physical  Anesthesia Plan  ASA: III  Anesthesia Plan: General   Post-op Pain Management:    Induction:   PONV Risk Score and Plan: Ondansetron, Dexamethasone, Midazolam and Treatment may vary due to age or medical condition  Airway Management Planned: LMA  Additional Equipment:   Intra-op Plan:   Post-operative Plan:   Informed Consent: I have reviewed the patients History and Physical, chart, labs and discussed the procedure including the risks, benefits and alternatives for the proposed anesthesia with the patient or authorized representative who has indicated his/her understanding and acceptance.       Plan Discussed with: CRNA  Anesthesia Plan Comments:         Anesthesia Quick Evaluation

## 2020-10-01 NOTE — Progress Notes (Signed)
Pt gave self insulin injection with assistance.

## 2020-10-01 NOTE — Progress Notes (Signed)
Daily Progress Note   Subjective  - 2 Days Post-Op  Fu right foot  Objective Vitals:   09/30/20 1506 09/30/20 2035 10/01/20 0552 10/01/20 0744  BP: 118/63 113/71 117/80 113/63  Pulse: 67 77 71 66  Resp: 16 17 17 16   Temp: 97.7 F (36.5 C) 98 F (36.7 C) 98.2 F (36.8 C) 98.5 F (36.9 C)  TempSrc:   Oral   SpO2: 97% 96% 97% 96%  Weight:      Height:        Physical Exam: Still severe erythema to the dorsal lateral aspect of the right foot with areas of necrotic tissue laterally.  There is worsening purulent drainage to the plantar arch at this point with residual erythema at this time.  The fifth toe and fifth ray is completely necrotic at this point.  Laboratory CBC    Component Value Date/Time   WBC 13.3 (H) 10/01/2020 0610   HGB 12.8 (L) 10/01/2020 0610   HCT 37.2 (L) 10/01/2020 0610   PLT 232 10/01/2020 0610    BMET    Component Value Date/Time   NA 130 (L) 09/30/2020 1043   K 3.9 09/30/2020 1043   CL 93 (L) 09/30/2020 1043   CO2 27 09/30/2020 1043   GLUCOSE 257 (H) 09/30/2020 1043   BUN 16 09/30/2020 1043   CREATININE 1.13 09/30/2020 1043   CREATININE 0.98 04/13/2015 1440   CALCIUM 8.2 (L) 09/30/2020 1043   GFRNONAA >60 09/30/2020 1043   GFRAA >60 01/25/2017 1755    Assessment/Planning: Continued abscess with infection right foot   Given the worsening purulent drainage and residual erythema at this time I would recommend I&D and removal of the fifth ray at this point for further decreasing infection.  Discussed this with the patient.  Unfortunately he just ate an hour ago.  Will have to hold on surgery until this evening at 8:00.  I discussed the risk benefits alternatives and complications and consent has been given.  He is n.p.o. now.    03/27/2017 A  10/01/2020, 1:02 PM

## 2020-10-01 NOTE — Progress Notes (Signed)
[facility] NAME: [name]    MR#:  [id number]  DATE OF BIRTH:  [birth year]  SUBJECTIVE:  feels better after foot surgery. Less pain. Denies any abdominal pain. Tolerating diet.  Tolerated surgery right foot well.  REVIEW OF SYSTEMS:   Review of Systems  Constitutional: Positive for chills. Negative for fever and weight loss.  HENT: Negative for ear discharge, ear pain and nosebleeds.   Eyes: Negative for blurred vision, pain and discharge.  Respiratory: Negative for sputum production, shortness of breath, wheezing and stridor.   Cardiovascular: Negative for chest pain, palpitations, orthopnea and PND.  Gastrointestinal: Positive for nausea. Negative for abdominal pain, diarrhea and vomiting.  Genitourinary: Negative for frequency and urgency.  Musculoskeletal: Positive for joint pain. Negative for back pain.  Neurological: Negative for sensory change, speech change, focal weakness and weakness.  Psychiatric/Behavioral: Negative for depression and hallucinations. The patient is not nervous/anxious.    Tolerating Diet:yes Tolerating PT:   DRUG ALLERGIES:  No Known Allergies  VITALS:  Blood pressure 113/63, pulse 66, temperature 98.5 F (36.9 C), resp. rate 16, height 6' 2"  (1.88 m), weight 104.3 kg, SpO2 96 %.  PHYSICAL EXAMINATION:   Physical Exam  GENERAL:  50 y.o.-year-old patient lying in the bed with no acute distress.  LUNGS: Normal breath sounds bilaterally, no wheezing, rales, rhonchi. No use of accessory muscles of respiration.  CARDIOVASCULAR: S1, S2 normal. No murmurs, rubs, or gallops.  ABDOMEN: Soft, nontender, nondistended. Bowel sounds present. No organomegaly or mass.  EXTREMITIES: bulky right foot dressing+ NEUROLOGIC: grossly nonfocal PSYCHIATRIC:  patient is alert and oriented x 3.  SKIN: as above  LABORATORY PANEL:  CBC Recent Labs  Lab 10/01/20 0610  WBC 13.3*  HGB 12.8*  HCT 37.2*  PLT  232    Chemistries  Recent Labs  Lab 09/30/20 1043  NA 130*  K 3.9  CL 93*  CO2 27  GLUCOSE 257*  BUN 16  CREATININE 1.13  CALCIUM 8.2*  AST 54*  ALT 46*  ALKPHOS 199*  BILITOT 2.1*   Cardiac Enzymes No results for input(s): TROPONINI in the last 168 hours. RADIOLOGY:  CT ABDOMEN PELVIS W CONTRAST  Result Date: 09/30/2020 CLINICAL DATA:  Cholelithiasis.  Cirrhosis. EXAM: CT ABDOMEN AND PELVIS WITH CONTRAST TECHNIQUE: Multidetector CT imaging of the abdomen and pelvis was performed using the standard protocol following bolus administration of intravenous contrast. CONTRAST:  18m OMNIPAQUE IOHEXOL 300 MG/ML  SOLN COMPARISON:  January 25, 2017 FINDINGS: Lower chest: There are trace bilateral pleural effusions. The heart size is borderline enlarged. Hepatobiliary: The liver appears cirrhotic. There is gallbladder wall thickening and pericholecystic free fluid.There is no biliary ductal dilation. Pancreas: Normal contours without ductal dilatation. No peripancreatic fluid collection. Spleen: The spleen is enlarged. Adrenals/Urinary Tract: --Adrenal glands: Unremarkable. --Right kidney/ureter: No hydronephrosis or radiopaque kidney stones. --Left kidney/ureter: No hydronephrosis or radiopaque kidney stones. --Urinary bladder: Unremarkable. Stomach/Bowel: --Stomach/Duodenum: No hiatal hernia or other gastric abnormality. Normal duodenal course and caliber. --Small bowel: Unremarkable. --Colon: Unremarkable. --Appendix: Normal. Vascular/Lymphatic: Atherosclerotic calcification is present within the non-aneurysmal abdominal aorta, without hemodynamically significant stenosis. --there are mildly enlarged retroperitoneal lymph nodes which have not substantially changed from prior study. --No mesenteric lymphadenopathy. --there are multiple enlarged right pelvic and right inguinal lymph nodes. Reproductive: Unremarkable Other: There is a trace amount of free fluid in the patient's abdomen and pelvis. No  free air. The abdominal wall is normal. Musculoskeletal. No acute displaced  fractures. IMPRESSION: 1. Cirrhosis with stigmata of portal hypertension. 2. Gallbladder wall thickening and pericholecystic free fluid may be secondary to underlying liver disease. Acute cholecystitis is not excluded. Correlation with the prior ultrasound is recommended. 3. Trace bilateral pleural effusions. 4. Multiple enlarged right pelvic and right inguinal lymph nodes, likely reactive. Correlation for right lower extremity infectious process is recommended. Aortic Atherosclerosis (ICD10-I70.0). Electronically Signed   By: Constance Holster M.D.   On: 09/30/2020 16:04   US Abdomen Limited RUQ (LIVER/GB)  Addendum Date: 09/30/2020   ADDENDUM REPORT: 09/30/2020 10:07 ADDENDUM: Study discussed by telephone with Dr. Fritzi Mandes on 09/30/2020 at 1000 hours. Electronically Signed   By: Genevie Ann M.D.   On: 09/30/2020 10:07   Result Date: 09/30/2020 CLINICAL DATA:  50 year old male with abnormal transaminases. EXAM: ULTRASOUND ABDOMEN LIMITED RIGHT UPPER QUADRANT COMPARISON:  CT Abdomen and Pelvis 01/25/2017. FINDINGS: Gallbladder: Abnormal gallbladder wall thickening and/or edema with trace pericholecystic fluid (image 2). Combined the abnormal wall thickness is 5 mm or greater. Only occasional small gallstones are suspected, such as on image 15 measuring roughly 8 mm. Positive sonographic Murphy sign. Common bile duct: Diameter: 4 mm, normal. Liver: No intrahepatic biliary ductal dilatation identified. No discrete liver lesion. Background liver echogenicity within normal limits (images 32, 35). Questionable nodular liver contour (images 38, 41). Portal vein is patent on color Doppler imaging with normal direction of blood flow towards the liver. Other: Negative visible right kidney. IMPRESSION: 1. Generalized gallbladder edema and positive sonographic Murphy sign. Only occasional small gallstones identified. In this setting differential  considerations include gallbladder edema secondary to cirrhosis/hepatitis (see #2) versus Acute Calculus or Acalculous Cholecystitis. Follow-up nuclear medicine hepatobiliary scan may be valuable to evaluate patency of the cystic duct. 2. Mildly nodular liver contour raising the possibility of cirrhosis. No discrete liver lesion. 3. No evidence of biliary duct obstruction. Electronically Signed: By: Genevie Ann M.D. On: 09/30/2020 09:09   ASSESSMENT AND PLAN:  [name] is a 50 y.o. male with medical history significant of CAD, hypertension, hyperlipidemia, diabetes who presents with worsening right foot wound.Patient states that 5 or 6 days ago he fell on getting out of his truck and cut his foot on a piece of metal (alluminum, not rusted) after slipping on some ice.   Sepsis POA due to Cellulitis/Diabetice foot post injury --sepsis resolved. -- Patient came in with tachycardia heart rate above 90 with elevated white count and significant right foot infection. - tdap given -  podiatry consult with Dr. Vickki Muff s/p debridement of multiple abscesses on the right foot.  -- Continue Zosyn and Linezolid -  ID consultation with Dr. Steva Ready appreciated - Follow-up  blood culture negative -[facility] -- vascular consultation appreciated. 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 Dahlia Byes.  -Patient is asymptomatic.  -- Patient denies history of  alcoholism. He drinks very seldom. I did discuss his CT scan results and discussed about cirrhosis with portal hypertension which could be due to  his uncontrolled diabetes and NASH  DVT prophylaxis:      Heparin  Code Status:              Full  Family Communication:        patient tells me his family has been updated I do not need to call anybody Disposition Plan:              Patient is from:                        Home             Anticipated DC to:                   Home             Anticipated DC date:               1 to 5 days   Level of care: Med-Surg Status is: inpatient  Patient needs vascular consultation to evaluate peripheral vascular disease. Surgical consultation for abnormal ultrasound abdomen. Continue monitoring right foot per podiatry. May need further surgery.      TOTAL TIME TAKING CARE OF THIS PATIENT: 25* minutes.  >50% time spent on counselling and coordination of care  Note: This dictation was prepared with Dragon dictation along with smaller phrase technology. Any transcriptional errors that result from this process are unintentional.  Fritzi Mandes M.D    [facility]   CC: Primary care physician; Coral Spikes, DOPatient ID: [name], male   DOB: 1971-07-17, 50 y.o.   MRN: [id number]

## 2020-10-01 NOTE — Progress Notes (Signed)
Patient underwent debridement with fifth ray amputation and I&D to the right foot.  No tourniquet was used and only minimal bleeding was noted at this time.   Vascular consult has been placed for evaluation tomorrow.  Patient to remain nonweightbearing.

## 2020-10-01 NOTE — Transfer of Care (Signed)
Immediate Anesthesia Transfer of Care Note  Patient: [name]  Procedure(s) Performed: AMPUTATION 5th  RAY AND IRRIGATION AND DEBRIDEMENT (Right Foot)  Patient Location: PACU  Anesthesia Type:General  Level of Consciousness: awake  Airway & Oxygen Therapy: Patient connected to face mask oxygen  Post-op Assessment: Post -op Vital signs reviewed and stable  Post vital signs: stable  Last Vitals:  Vitals Value Taken Time  BP 112/65 10/01/20 2112  Temp    Pulse 67 10/01/20 2113  Resp 19 10/01/20 2113  SpO2 98 % 10/01/20 2113  Vitals shown include unvalidated device data.  Last Pain:  Vitals:   10/01/20 1400  TempSrc:   PainSc: 5       Patients Stated Pain Goal: 0 (38/25/05 3976)  Complications: No complications documented.

## 2020-10-01 NOTE — Anesthesia Postprocedure Evaluation (Signed)
Anesthesia Post Note  Patient: [name]  Procedure(s) Performed: AMPUTATION 5th  RAY AND IRRIGATION AND DEBRIDEMENT (Right Foot)  Patient location during evaluation: PACU Anesthesia Type: General Level of consciousness: awake and alert Pain management: pain level controlled Vital Signs Assessment: post-procedure vital signs reviewed and stable Respiratory status: spontaneous breathing and respiratory function stable Cardiovascular status: stable Anesthetic complications: no   No complications documented.   Last Vitals:  Vitals:   10/01/20 2130 10/01/20 2145  BP: 135/85 (!) 148/88  Pulse: 66 64  Resp: 19 19  Temp:  (!) 36.1 C  SpO2: 92% 92%    Last Pain:  Vitals:   10/01/20 2145  TempSrc:   PainSc: 0-No pain                 Crue Otero K

## 2020-10-01 NOTE — Progress Notes (Signed)
[facility]  Daily Progress Note   Assessment/Planning:   POD #2 s/p I&D R foot   Please see Vascular consult on 09/28/20  On exam, I can feel an AT pulse and faint PT pulse, so normally I don't angio these pt  Will defer any further conversation in regards additional endovascular intervention to Dr. Ronalee Belts, who will resume care tomorrow   Subjective  - 2 Days Post-Op   Pain tolerable   Objective   Vitals:   09/30/20 1506 09/30/20 2035 10/01/20 0552 10/01/20 0744  BP: 118/63 113/71 117/80 113/63  Pulse: 67 77 71 66  Resp: 16 17 17 16   Temp: 97.7 F (36.5 C) 98 F (36.7 C) 98.2 F (36.8 C) 98.5 F (36.9 C)  TempSrc:   Oral   SpO2: 97% 96% 97% 96%  Weight:      Height:         Intake/Output Summary (Last 24 hours) at 10/01/2020 1287 Last data filed at 10/01/2020 0758 Gross per 24 hour  Intake 720 ml  Output 2400 ml  Net -1680 ml    VASC: right foot bandaged, sensation in toes intact, palpable AT, faintly palpable PT  Laboratory   CBC CBC Latest Ref Rng & Units 10/01/2020 09/30/2020 09/28/2020  WBC 4.0 - 10.5 K/uL 13.3(H) 14.3(H) 16.4(H)  Hemoglobin 13.0 - 17.0 g/dL 12.8(L) 12.8(L) 13.0  Hematocrit 39.0 - 52.0 % 37.2(L) 36.3(L) 37.4(L)  Platelets 150 - 400 K/uL 232 197 211    BMET    Component Value Date/Time   NA 130 (L) 09/30/2020 1043   K 3.9 09/30/2020 1043   CL 93 (L) 09/30/2020 1043   CO2 27 09/30/2020 1043   GLUCOSE 257 (H) 09/30/2020 1043   BUN 16 09/30/2020 1043   CREATININE 1.13 09/30/2020 1043   CREATININE 0.98 04/13/2015 1440   CALCIUM 8.2 (L) 09/30/2020 1043   GFRNONAA >60 09/30/2020 1043   GFRAA >60 01/25/2017 1755     Adele Barthel, MD, FACS, FSVS Covering for [facility]: [phone]

## 2020-10-01 NOTE — Progress Notes (Signed)
CC: gall stones Subjective: [name] seen and examined.  He exhibits no abdominal pain.  Nausea no vomiting.  I have personally reviewed the CT scan showing evidence of cirrhosis and portal hypertension.  Current CMP shows mild persistent elevation of the bilirubin and transaminitis.  Overall this is improving.  Main issue continues to be his diabetic right foot  Objective: Vital signs in last 24 hours: Temp:  [97.7 F (36.5 C)-98.5 F (36.9 C)] 98.5 F (36.9 C) (02/13 0744) Pulse Rate:  [66-77] 66 (02/13 0744) Resp:  [16-17] 16 (02/13 0744) BP: (113-118)/(63-80) 113/63 (02/13 0744) SpO2:  [96 %-98 %] 96 % (02/13 0744) Last BM Date: 09/29/20  Intake/Output from previous day: 02/12 0701 - 02/13 0700 In: 771.4 [P.O.:120; IV Piggyback:651.4] Out: 2400 [Urine:2400] Intake/Output this shift: Total I/O In: 300 [IV Piggyback:300] Out: 500 [Urine:500]  Physical exam: NAD alert Abd: soft nt , no murphy Ext: right leg cellulitis covered with wrap  Lab Results: CBC  Recent Labs    09/30/20 1043 10/01/20 0610  WBC 14.3* 13.3*  HGB 12.8* 12.8*  HCT 36.3* 37.2*  PLT 197 232   BMET Recent Labs    09/30/20 1043  NA 130*  K 3.9  CL 93*  CO2 27  GLUCOSE 257*  BUN 16  CREATININE 1.13  CALCIUM 8.2*   PT/INR Recent Labs    09/30/20 1051  LABPROT 15.0  INR 1.2   ABG No results for input(s): PHART, HCO3 in the last 72 hours.  Invalid input(s): PCO2, PO2  Studies/Results: CT ABDOMEN PELVIS W CONTRAST  Result Date: 09/30/2020 CLINICAL DATA:  Cholelithiasis.  Cirrhosis. EXAM: CT ABDOMEN AND PELVIS WITH CONTRAST TECHNIQUE: Multidetector CT imaging of the abdomen and pelvis was performed using the standard protocol following bolus administration of intravenous contrast. CONTRAST:  111m OMNIPAQUE IOHEXOL 300 MG/ML  SOLN COMPARISON:  January 25, 2017 FINDINGS: Lower chest: There are trace bilateral pleural effusions. The heart size is borderline enlarged. Hepatobiliary: The liver  appears cirrhotic. There is gallbladder wall thickening and pericholecystic free fluid.There is no biliary ductal dilation. Pancreas: Normal contours without ductal dilatation. No peripancreatic fluid collection. Spleen: The spleen is enlarged. Adrenals/Urinary Tract: --Adrenal glands: Unremarkable. --Right kidney/ureter: No hydronephrosis or radiopaque kidney stones. --Left kidney/ureter: No hydronephrosis or radiopaque kidney stones. --Urinary bladder: Unremarkable. Stomach/Bowel: --Stomach/Duodenum: No hiatal hernia or other gastric abnormality. Normal duodenal course and caliber. --Small bowel: Unremarkable. --Colon: Unremarkable. --Appendix: Normal. Vascular/Lymphatic: Atherosclerotic calcification is present within the non-aneurysmal abdominal aorta, without hemodynamically significant stenosis. --there are mildly enlarged retroperitoneal lymph nodes which have not substantially changed from prior study. --No mesenteric lymphadenopathy. --there are multiple enlarged right pelvic and right inguinal lymph nodes. Reproductive: Unremarkable Other: There is a trace amount of free fluid in the patient's abdomen and pelvis. No free air. The abdominal wall is normal. Musculoskeletal. No acute displaced fractures. IMPRESSION: 1. Cirrhosis with stigmata of portal hypertension. 2. Gallbladder wall thickening and pericholecystic free fluid may be secondary to underlying liver disease. Acute cholecystitis is not excluded. Correlation with the prior ultrasound is recommended. 3. Trace bilateral pleural effusions. 4. Multiple enlarged right pelvic and right inguinal lymph nodes, likely reactive. Correlation for right lower extremity infectious process is recommended. Aortic Atherosclerosis (ICD10-I70.0). Electronically Signed   By: CConstance HolsterM.D.   On: 09/30/2020 16:04   UKoreaAbdomen Limited RUQ (LIVER/GB)  Addendum Date: 09/30/2020   ADDENDUM REPORT: 09/30/2020 10:07 ADDENDUM: Study discussed by telephone with  Dr. SFritzi Mandeson 09/30/2020 at 1000 hours. Electronically Signed  By: Genevie Ann M.D.   On: 09/30/2020 10:07   Result Date: 09/30/2020 CLINICAL DATA:  50 year old male with abnormal transaminases. EXAM: ULTRASOUND ABDOMEN LIMITED RIGHT UPPER QUADRANT COMPARISON:  CT Abdomen and Pelvis 01/25/2017. FINDINGS: Gallbladder: Abnormal gallbladder wall thickening and/or edema with trace pericholecystic fluid (image 2). Combined the abnormal wall thickness is 5 mm or greater. Only occasional small gallstones are suspected, such as on image 15 measuring roughly 8 mm. Positive sonographic Murphy sign. Common bile duct: Diameter: 4 mm, normal. Liver: No intrahepatic biliary ductal dilatation identified. No discrete liver lesion. Background liver echogenicity within normal limits (images 32, 35). Questionable nodular liver contour (images 38, 41). Portal vein is patent on color Doppler imaging with normal direction of blood flow towards the liver. Other: Negative visible right kidney. IMPRESSION: 1. Generalized gallbladder edema and positive sonographic Murphy sign. Only occasional small gallstones identified. In this setting differential considerations include gallbladder edema secondary to cirrhosis/hepatitis (see #2) versus Acute Calculus or Acalculous Cholecystitis. Follow-up nuclear medicine hepatobiliary scan may be valuable to evaluate patency of the cystic duct. 2. Mildly nodular liver contour raising the possibility of cirrhosis. No discrete liver lesion. 3. No evidence of biliary duct obstruction. Electronically Signed: By: Genevie Ann M.D. On: 09/30/2020 09:09    Anti-infectives: Anti-infectives (From admission, onward)    Start     Dose/Rate Route Frequency Ordered Stop   09/28/20 1800  vancomycin (VANCOREADY) IVPB 1500 mg/300 mL  Status:  Discontinued        1,500 mg 150 mL/hr over 120 Minutes Intravenous Every 12 hours 09/28/20 0915 09/28/20 1553   09/28/20 1800  linezolid (ZYVOX) IVPB 600 mg        600  mg 300 mL/hr over 60 Minutes Intravenous Every 12 hours 09/28/20 1553     09/28/20 1700  piperacillin-tazobactam (ZOSYN) IVPB 3.375 g        3.375 g 12.5 mL/hr over 240 Minutes Intravenous Every 8 hours 09/28/20 1553     09/28/20 0600  ceFEPIme (MAXIPIME) 2 g in sodium chloride 0.9 % 100 mL IVPB  Status:  Discontinued        2 g 200 mL/hr over 30 Minutes Intravenous Every 8 hours 09/28/20 0216 09/28/20 1553   09/28/20 0500  vancomycin (VANCOREADY) IVPB 1750 mg/350 mL  Status:  Discontinued        1,750 mg 175 mL/hr over 120 Minutes Intravenous Every 12 hours 09/28/20 0222 09/28/20 0915   09/28/20 0200  metroNIDAZOLE (FLAGYL) IVPB 500 mg  Status:  Discontinued        500 mg 100 mL/hr over 60 Minutes Intravenous Every 8 hours 09/27/20 2320 09/28/20 1552   09/27/20 2130  ceFEPIme (MAXIPIME) 2 g in sodium chloride 0.9 % 100 mL IVPB        2 g 200 mL/hr over 30 Minutes Intravenous  Once 09/27/20 2120 09/27/20 2310   09/27/20 2130  metroNIDAZOLE (FLAGYL) IVPB 500 mg  Status:  Discontinued        500 mg 100 mL/hr over 60 Minutes Intravenous  Once 09/27/20 2120 09/28/20 0122   09/27/20 2130  vancomycin (VANCOCIN) IVPB 1000 mg/200 mL premix        1,000 mg 200 mL/hr over 60 Minutes Intravenous  Once 09/27/20 2120 09/28/20 0106       Assessment/Plan: Cholelithiasis and cholestasis in a patient with cirrhosis and currently with severe cellulitis and soft tissue infection of the right foot.  He is cholelithiasis right now is the least of his  concerns at this time.  I will be happy to follow him up as an outpatient in about a month or so.  First order of business is to control his sepsis from his right foot and podiatry and vascular is working with this.  No need for surgical intervention from an abdominal perspective.  Cholestasis can also be caused by sepsis as well as some hepatitis.  Does not seem to have an obstructive biliary process.  Please note that I spent over 35 minutes in this encounter  with greater than 50% spent in coordination and counseling of his care.  Case discussed in detail with Dr. Jetty Peeks, MD, [facility]  10/01/2020

## 2020-10-01 NOTE — Op Note (Signed)
Operative note   Surgeon:Quinnten Calvin Lawyer: None    Preop diagnosis: Diffuse infection distal right foot    Postop diagnosis: Same    Procedure: 1.  Partial fifth ray amputation 2.  Incision and drainage multiple locations dorsal and plantar right foot 3.  Placement of antibiotic beads 4.  Placement of wound VAC    EBL: Minimal    Anesthesia:local and general.  Local consisted of 10 cc of 0.5% bupivacaine and 1% lidocaine and an ankle block fashion    Hemostasis: None    Specimen: Fifth ray amputation with gangrene    Complications: None    Operative indications:[name] is an 50 y.o. that presents today for surgical intervention.  The risks/benefits/alternatives/complications have been discussed and consent has been given.    Procedure:  Patient was brought into the OR and placed on the operating table in thesupine position. After anesthesia was obtained theright lower extremity was prepped and draped in usual sterile fashion.  Attention was initially directed to the right foot where the fifth toe was noted to be quite dusky and gangrenous with a large amount of gangrene and necrotic tissue to the lateral aspect of the right foot.  At this time the entire fifth ray was removed with only a small amount of the residual base of the fifth metatarsal remaining.  This was removed from the surgical field in toto and sent for pathological examination.  At this time the dorsal aspect of the foot was drained of all infection.  There was an area of superficial early necrosis of skin to the dorsal midfoot course along the fourth and fifth ray region.    At this time the plantar aspect of the midfoot at the previous open infection on the mid plantar fascial region was opened further into the medial arch.  The incision was taken down to the plantar fashion the plantar fascia was also incised.  The deeper area was evaluated and further purulent drainage was noted from this area.  Blunt  dissection was undertaken in this region.  From the lateral aspect of the foot this connected to the mid arch into the medial arch area.  All this area was flushed with copious amounts of irrigation.  The lateral aspect of the right foot where the fifth ray amputation was performed was debrided full-thickness down to muscle and bone.  A total size of the lateral wound upon final debridement was 7 x 4 cm.  Depth was down to bone and muscle.  After further evaluation no further infectious process was noted at this time.  The plantar wound was then infiltrated with vancomycin impregnated calcium sulfate stimulan beads.  The silver impregnated dressing was placed into the plantar and lateral aspect of the wound under 125 mmHg continuous.  Bulky sterile dressing was then applied.    Patient tolerated the procedure and anesthesia well.  Was transported from the OR to the PACU with all vital signs stable and vascular status intact. To be discharged per routine protocol.

## 2020-10-02 ENCOUNTER — Encounter: Payer: Self-pay | Admitting: Podiatry

## 2020-10-02 ENCOUNTER — Other Ambulatory Visit (INDEPENDENT_AMBULATORY_CARE_PROVIDER_SITE_OTHER): Payer: Self-pay | Admitting: Vascular Surgery

## 2020-10-02 LAB — GLUCOSE, CAPILLARY
Glucose-Capillary: 138 mg/dL — ABNORMAL HIGH (ref 70–99)
Glucose-Capillary: 277 mg/dL — ABNORMAL HIGH (ref 70–99)
Glucose-Capillary: 109 mg/dL — ABNORMAL HIGH (ref 70–99)
Glucose-Capillary: 107 mg/dL — ABNORMAL HIGH (ref 70–99)

## 2020-10-02 LAB — CULTURE, BLOOD (ROUTINE X 2)
Culture: NO GROWTH
Culture: NO GROWTH
Special Requests: ADEQUATE
Special Requests: ADEQUATE

## 2020-10-02 MED ORDER — SODIUM CHLORIDE 0.9 % IV SOLN
3.0000 g | Freq: Four times a day (QID) | INTRAVENOUS | Status: DC
  Administered 2020-10-02 – 2020-10-13 (×40): 3 g via INTRAVENOUS
  Filled 2020-10-02 (×2): qty 8
  Filled 2020-10-02 (×2): qty 3
  Filled 2020-10-02 (×2): qty 8
  Filled 2020-10-02: qty 3
  Filled 2020-10-02: qty 8
  Filled 2020-10-02: qty 3
  Filled 2020-10-02 (×2): qty 8
  Filled 2020-10-02: qty 3
  Filled 2020-10-02: qty 8
  Filled 2020-10-02: qty 3
  Filled 2020-10-02 (×2): qty 8
  Filled 2020-10-02: qty 3
  Filled 2020-10-02 (×2): qty 8
  Filled 2020-10-02 (×4): qty 3
  Filled 2020-10-02: qty 8
  Filled 2020-10-02 (×3): qty 3
  Filled 2020-10-02 (×3): qty 8
  Filled 2020-10-02: qty 3
  Filled 2020-10-02: qty 8
  Filled 2020-10-02 (×3): qty 3
  Filled 2020-10-02: qty 8
  Filled 2020-10-02: qty 3
  Filled 2020-10-02: qty 8
  Filled 2020-10-02 (×2): qty 3
  Filled 2020-10-02 (×3): qty 8
  Filled 2020-10-02: qty 3
  Filled 2020-10-02 (×2): qty 8
  Filled 2020-10-02 (×2): qty 3
  Filled 2020-10-02 (×3): qty 8

## 2020-10-02 MED ORDER — INSULIN ASPART 100 UNIT/ML ~~LOC~~ SOLN
8.0000 [IU] | Freq: Three times a day (TID) | SUBCUTANEOUS | Status: DC
  Administered 2020-10-03 – 2020-10-14 (×29): 8 [IU] via SUBCUTANEOUS
  Filled 2020-10-02 (×28): qty 1

## 2020-10-02 NOTE — H&P (View-Only) (Signed)
[facility] Daily Progress Note  Subjective: Pressure over complaint.  No issues overnight.  Objective: Vitals:   10/02/20 0002 10/02/20 0406 10/02/20 0812 10/02/20 1252  BP: 114/72 124/76 (!) 137/92 122/78  Pulse: 72 72 75 72  Resp: 16 16 16 16   Temp: 97.6 F (36.4 C) (!) 97.5 F (36.4 C) 98.5 F (36.9 C) 98.8 F (37.1 C)  TempSrc: Oral  Oral   SpO2: 92% 96% 97% 94%  Weight:      Height:        Intake/Output Summary (Last 24 hours) at 10/02/2020 1404 Last data filed at 10/02/2020 1300 Gross per 24 hour  Intake 495.1 ml  Output 2425 ml  Net -1929.9 ml   Physical Exam: A&Ox3, NAD CV: RRR Pulmonary: CTA Bilaterally Abdomen: Soft, Nontender, Nondistended Vascular:  Right lower extremity: Thigh soft.  Calf soft.  VAC intact and to suction.   Laboratory: CBC    Component Value Date/Time   WBC 13.3 (H) 10/01/2020 0610   HGB 12.8 (L) 10/01/2020 0610   HCT 37.2 (L) 10/01/2020 0610   PLT 232 10/01/2020 0610   BMET    Component Value Date/Time   NA 130 (L) 09/30/2020 1043   K 3.9 09/30/2020 1043   CL 93 (L) 09/30/2020 1043   CO2 27 09/30/2020 1043   GLUCOSE 257 (H) 09/30/2020 1043   BUN 16 09/30/2020 1043   CREATININE 1.13 09/30/2020 1043   CREATININE 0.98 04/13/2015 1440   CALCIUM 8.2 (L) 09/30/2020 1043   GFRNONAA >60 09/30/2020 1043   GFRAA >60 01/25/2017 1755   Assessment/Planning: The patient is a 50 year old male who presents with nonhealing wound to the right foot  1) patient was taken to the operating room over the weekend but was noted minimal bleeding to the amputation site. 2) in an attempt to improve the patient's arterial patency recommend undergoing a right lower extremity angiogram possible intervention and attempt to assess the patient's anatomy and contributing degree of atherosclerotic disease.  If appropriate, an attempt to revascularize leg made at that time.  Procedure, risks and benefits were explained to the patient.   All questions were answered.  The patient wishes to proceed.  We will plan on this tomorrow. 3) diabetes: Encouraged good control as its slows the progression of atherosclerotic disease  Discussed with Dr. Eber Hong British Moyd PA-C 10/02/2020 2:04 PM

## 2020-10-02 NOTE — Progress Notes (Signed)
[facility] NAME: [name]    MR#:  [id number]  DATE OF BIRTH:  04/03/1971  SUBJECTIVE:  feels better after foot surgery. Less pain. Denies any abdominal pain. Tolerating diet.  Tolerated surgery right foot well again on 2/13 C/o foot pain. Has wound vac+  REVIEW OF SYSTEMS:   Review of Systems  Constitutional: Negative for fever and weight loss.  HENT: Negative for ear discharge, ear pain and nosebleeds.   Eyes: Negative for blurred vision, pain and discharge.  Respiratory: Negative for sputum production, shortness of breath, wheezing and stridor.   Cardiovascular: Negative for chest pain, palpitations, orthopnea and PND.  Gastrointestinal: Negative for abdominal pain, diarrhea and vomiting.  Genitourinary: Negative for frequency and urgency.  Musculoskeletal: Positive for joint pain. Negative for back pain.  Neurological: Negative for sensory change, speech change, focal weakness and weakness.  Psychiatric/Behavioral: Negative for depression and hallucinations. The patient is not nervous/anxious.    Tolerating Diet:yes Tolerating PT:  pending due to wound vac  DRUG ALLERGIES:  No Known Allergies  VITALS:  Blood pressure 122/78, pulse 72, temperature 98.8 F (37.1 C), resp. rate 16, height 6' 2"  (1.88 m), weight 104.3 kg, SpO2 94 %.  PHYSICAL EXAMINATION:   Physical Exam  GENERAL:  50 y.o.-year-old patient lying in the bed with no acute distress.  LUNGS: Normal breath sounds bilaterally, no wheezing, rales, rhonchi. No use of accessory muscles of respiration.  CARDIOVASCULAR: S1, S2 normal. No murmurs, rubs, or gallops.  ABDOMEN: Soft, nontender, nondistended. Bowel sounds present. No organomegaly or mass.  EXTREMITIES: bulky right foot dressing+ NEUROLOGIC: grossly nonfocal PSYCHIATRIC:  patient is alert and oriented x 3.  SKIN: as above  LABORATORY PANEL:  CBC Recent Labs  Lab 10/01/20 0610  WBC 13.3*   HGB 12.8*  HCT 37.2*  PLT 232    Chemistries  Recent Labs  Lab 09/30/20 1043  NA 130*  K 3.9  CL 93*  CO2 27  GLUCOSE 257*  BUN 16  CREATININE 1.13  CALCIUM 8.2*  AST 54*  ALT 46*  ALKPHOS 199*  BILITOT 2.1*   Cardiac Enzymes No results for input(s): TROPONINI in the last 168 hours. RADIOLOGY:  No results found. ASSESSMENT AND PLAN:  [name] is a 50 y.o. male with medical history significant of CAD, hypertension, hyperlipidemia, diabetes who presents with worsening right foot wound.Patient states that 5 or 6 days ago he fell on getting out of his truck and cut his foot on a piece of metal (alluminum, not rusted) after slipping on some ice.   Sepsis POA due to Cellulitis/Diabetice foot post injury --sepsis resolved. -- Patient came in with tachycardia heart rate above 90 with elevated white count and significant right foot infection. - tdap given -  podiatry consult with Dr. Vickki Muff  --2/11--s/p debridement of multiple abscesses on the right foot on 2/11 -- 2/13--s/p partial fifth three amputation. Incision drainage multiple location dorsal and plantar right foot. Placement of antibiotic beads. Placement of wound vac -- Continue Zosyn and Linezolid--per ID consultation with Dr. Steva Ready  - Follow-up  blood culture negative -WC S Agalactiae -- vascular consultation appreciated. 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 Dahlia Byes.  -Patient  is asymptomatic.  -- Patient denies history of alcoholism. He drinks very seldom. I did discuss his CT scan results and discussed about cirrhosis with portal hypertension which could be due to his uncontrolled diabetes and NASH  DVT prophylaxis:      Heparin  Code Status:              Full  Family Communication:        none--per pt request Disposition Plan:              Patient is from:                        Home             Anticipated DC to:                   Home             Anticipated DC date:               TBD   Level of care: Med-Surg Status is: inpatient  Patient needs vascular consultation to evaluate peripheral vascular disease. Continue monitoring right foot per podiatry. May need further surgery.      TOTAL TIME TAKING CARE OF THIS PATIENT: 25* minutes.  >50% time spent on counselling and coordination of care  Note: This dictation was prepared with Dragon dictation along with smaller phrase technology. Any transcriptional errors that result from this process are unintentional.  Fritzi Mandes M.D    [facility]   CC: Primary care physician; Coral Spikes, DOPatient ID: [name], male   DOB: 1971-04-20, 50 y.o.   MRN: [id number]

## 2020-10-02 NOTE — Progress Notes (Signed)
PODIATRY / FOOT AND ANKLE SURGERY PROGRESS NOTE  Reason for consult: Right foot infection    HPI: [name] is a 50 y.o. male who presents with who presents status post 1 day right partial fifth ray amputation with incision and drainage with application of antibiotic beads and wound VAC.  Patient apparently had bleeding overnight and compressive dressing was applied over the wound VAC and no further bleeding has occurred since that time.  Patient notes no pain to the right foot other than the area in which tubing is touching his leg from the wound VAC.  Patient is trying to maintain nonweightbearing on the right foot since the procedure.  PMHx:  Past Medical History:  Diagnosis Date  . Diabetes mellitus without complication (HCC)   . Heart disease   . Myocardial infarction [facility])     Surgical Hx:  Past Surgical History:  Procedure Laterality Date  . BACK SURGERY    . INCISION AND DRAINAGE Right 09/29/2020   Procedure: INCISION AND DRAINAGE, RIGHT FOOT;  Surgeon: Gwyneth Revels, DPM;  Location: [facility] ORS;  Service: Podiatry;  Laterality: Right;    FHx:  Family History  Problem Relation Age of Onset  . Diabetes Father   . Heart disease Father   . Breast cancer Mother   . Lung cancer Paternal Grandmother     Social History:  reports that he has been smoking cigarettes. He has been smoking about 1.00 pack per day. He has never used smokeless tobacco. He reports current alcohol use. He reports that he does not use drugs.  Allergies: No Known Allergies  Medications Prior to Admission  Medication Sig Dispense Refill  . atorvastatin (LIPITOR) 40 MG tablet Take 1 tablet (40 mg total) by mouth daily. 90 tablet 3  . Blood Glucose Monitoring Suppl (RELION PRIME MONITOR) DEVI Use up to 4 times daily to check blood sugars. 1 Device 0  . clopidogrel (PLAVIX) 75 MG tablet TAKE ONE TABLET BY MOUTH ONCE DAILY (Patient not taking: Reported on 09/28/2020) 30 tablet 0  . empagliflozin  (JARDIANCE) 10 MG TABS tablet Take 10 mg by mouth daily. (Patient not taking: Reported on 09/28/2020) 90 tablet 0  . gabapentin (NEURONTIN) 300 MG capsule Take 600 mg by mouth at bedtime as needed.    Marland Kitchen glipiZIDE (GLUCOTROL XL) 10 MG 24 hr tablet Take 10 mg by mouth daily.    Marland Kitchen glucose blood (RELION PRIME TEST) test strip Use as instructed 100 each 12  . lisinopril (PRINIVIL,ZESTRIL) 5 MG tablet TAKE ONE TABLET BY MOUTH ONCE DAILY 30 tablet 0  . meloxicam (MOBIC) 7.5 MG tablet Take 7.5 mg by mouth 2 (two) times daily.    . metFORMIN (GLUCOPHAGE) 500 MG tablet TAKE ONE TABLET BY MOUTH TWICE DAILY WITH A MEAL 30 tablet 0  . ReliOn Ultra Thin Lancets MISC Use as directed to take blood sugar. 200 each 0  . sildenafil (VIAGRA) 25 MG tablet Take 1 tablet by mouth daily.    Marland Kitchen sulfamethoxazole-trimethoprim (BACTRIM DS,SEPTRA DS) 800-160 MG tablet Take 1 tablet by mouth 2 (two) times daily. (Patient not taking: Reported on 09/28/2020) 20 tablet 0    Physical Exam: General: Alert and oriented.  No apparent distress.  Vascular: DP/PT pulses faintly palpable bilateral, no hair growth noted to digits bilateral.  Capillary fill time appears to be intact to both feet.  Neuro: Light touch sensation reduced to bilateral lower extremities.   Derm: Wound VAC in place to the right fifth metatarsal amputation  site, appears to be well adhered at this time with no leakage.  No active drainage noted.  Erythema and edema appear to be reduced overall compared to preoperative clinical pictures that were taken.  MSK: Right partial fifth ray amputation  Results for orders placed or performed during the hospital encounter of 09/27/20 (from the past 48 hour(s))  Glucose, capillary     Status: Abnormal   Collection Time: 09/30/20  5:26 PM  Result Value Ref Range   Glucose-Capillary 160 (H) 70 - 99 mg/dL    Comment: Glucose reference range applies only to samples taken after fasting for at least 8 hours.  Glucose,  capillary     Status: Abnormal   Collection Time: 09/30/20  9:49 PM  Result Value Ref Range   Glucose-Capillary 148 (H) 70 - 99 mg/dL    Comment: Glucose reference range applies only to samples taken after fasting for at least 8 hours.   Comment 1 Notify RN   CBC     Status: Abnormal   Collection Time: 10/01/20  6:10 AM  Result Value Ref Range   WBC 13.3 (H) 4.0 - 10.5 K/uL   RBC 3.94 (L) 4.22 - 5.81 MIL/uL   Hemoglobin 12.8 (L) 13.0 - 17.0 g/dL   HCT 01.0 (L) 93.2 - 35.5 %   MCV 94.4 80.0 - 100.0 fL   MCH 32.5 26.0 - 34.0 pg   MCHC 34.4 30.0 - 36.0 g/dL   RDW 73.2 20.2 - 54.2 %   Platelets 232 150 - 400 K/uL   nRBC 0.0 0.0 - 0.2 %    Comment: Performed at [facility], [address] [place], [place] [address]  Glucose, capillary     Status: Abnormal   Collection Time: 10/01/20  7:44 AM  Result Value Ref Range   Glucose-Capillary 123 (H) 70 - 99 mg/dL    Comment: Glucose reference range applies only to samples taken after fasting for at least 8 hours.  Glucose, capillary     Status: Abnormal   Collection Time: 10/01/20 11:34 AM  Result Value Ref Range   Glucose-Capillary 198 (H) 70 - 99 mg/dL    Comment: Glucose reference range applies only to samples taken after fasting for at least 8 hours.  Glucose, capillary     Status: Abnormal   Collection Time: 10/01/20  5:24 PM  Result Value Ref Range   Glucose-Capillary 102 (H) 70 - 99 mg/dL    Comment: Glucose reference range applies only to samples taken after fasting for at least 8 hours.  Glucose, capillary     Status: Abnormal   Collection Time: 10/01/20  9:14 PM  Result Value Ref Range   Glucose-Capillary 69 (L) 70 - 99 mg/dL    Comment: Glucose reference range applies only to samples taken after fasting for at least 8 hours.  Glucose, capillary     Status: None   Collection Time: 10/01/20  9:32 PM  Result Value Ref Range   Glucose-Capillary 71 70 - 99 mg/dL    Comment: Glucose reference range applies only to  samples taken after fasting for at least 8 hours.  Glucose, capillary     Status: None   Collection Time: 10/01/20 10:39 PM  Result Value Ref Range   Glucose-Capillary 91 70 - 99 mg/dL    Comment: Glucose reference range applies only to samples taken after fasting for at least 8 hours.  Glucose, capillary     Status: Abnormal   Collection Time: 10/02/20  8:14 AM  Result Value Ref Range   Glucose-Capillary 138 (H) 70 - 99 mg/dL    Comment: Glucose reference range applies only to samples taken after fasting for at least 8 hours.  Glucose, capillary     Status: Abnormal   Collection Time: 10/02/20 12:53 PM  Result Value Ref Range   Glucose-Capillary 277 (H) 70 - 99 mg/dL    Comment: Glucose reference range applies only to samples taken after fasting for at least 8 hours.   CT ABDOMEN PELVIS W CONTRAST  Result Date: 09/30/2020 CLINICAL DATA:  Cholelithiasis.  Cirrhosis. EXAM: CT ABDOMEN AND PELVIS WITH CONTRAST TECHNIQUE: Multidetector CT imaging of the abdomen and pelvis was performed using the standard protocol following bolus administration of intravenous contrast. CONTRAST:  OMNIPAQUE IOHEXOL 300 MG/ML  SOLN COMPARISON:  January 25, 2017 FINDINGS: Lower chest: There are trace bilateral pleural effusions. The heart size is borderline enlarged. Hepatobiliary: The liver appears cirrhotic. There is gallbladder wall thickening and pericholecystic free fluid.There is no biliary ductal dilation. Pancreas: Normal contours without ductal dilatation. No peripancreatic fluid collection. Spleen: The spleen is enlarged. Adrenals/Urinary Tract: --Adrenal glands: Unremarkable. --Right kidney/ureter: No hydronephrosis or radiopaque kidney stones. --Left kidney/ureter: No hydronephrosis or radiopaque kidney stones. --Urinary bladder: Unremarkable. Stomach/Bowel: --Stomach/Duodenum: No hiatal hernia or other gastric abnormality. Normal duodenal course and caliber. --Small bowel: Unremarkable. --Colon:  Unremarkable. --Appendix: Normal. Vascular/Lymphatic: Atherosclerotic calcification is present within the non-aneurysmal abdominal aorta, without hemodynamically significant stenosis. --there are mildly enlarged retroperitoneal lymph nodes which have not substantially changed from prior study. --No mesenteric lymphadenopathy. --there are multiple enlarged right pelvic and right inguinal lymph nodes. Reproductive: Unremarkable Other: There is a trace amount of free fluid in the patient's abdomen and pelvis. No free air. The abdominal wall is normal. Musculoskeletal. No acute displaced fractures. IMPRESSION: 1. Cirrhosis with stigmata of portal hypertension. 2. Gallbladder wall thickening and pericholecystic free fluid may be secondary to underlying liver disease. Acute cholecystitis is not excluded. Correlation with the prior ultrasound is recommended. 3. Trace bilateral pleural effusions. 4. Multiple enlarged right pelvic and right inguinal lymph nodes, likely reactive. Correlation for right lower extremity infectious process is recommended. Aortic Atherosclerosis (ICD10-I70.0). Electronically Signed   By: Katherine Mantle M.D.   On: 09/30/2020 16:04    Blood pressure 122/78, pulse 72, temperature 98.8 F (37.1 C), resp. rate 16, height 6\' 2"  (1.88 m), weight 104.3 kg, SpO2 94 %.  Assessment 1. 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 Diabetes type 2 polyneuropathy 3. PVD 4. History of tobacco abuse/smoking  Plan -Patient seen and examined. -Wound VAC appears to be fairly well stuck in place overall and appears to be suctioning well over the procedure site. -Redressed with gauze around the wound VAC, 4 x 4's, Kerlix, Ace wrap.  Patient is still have wound VAC changed every 2 to 3 days per wound care. -We will likely remove the wound VAC on Tuesday or Wednesday to assess the wound again.  Patient  will likely need revision procedure potentially in the future depending on how the area looks. -Appreciate vascular recommendations. -Appreciate antibiotic therapy recommendations per medicine.  Previous culture growing group B strep. -Patient to continue staying off the right foot is much as possible and to use heel for transfers only.  Thursday, DPM 10/02/2020, 1:33 PM

## 2020-10-02 NOTE — Progress Notes (Signed)
[facility] Daily Progress Note  Subjective: Pressure over complaint.  No issues overnight.  Objective: Vitals:   10/02/20 0002 10/02/20 0406 10/02/20 0812 10/02/20 1252  BP: 114/72 124/76 (!) 137/92 122/78  Pulse: 72 72 75 72  Resp: 16 16 16 16   Temp: 97.6 F (36.4 C) (!) 97.5 F (36.4 C) 98.5 F (36.9 C) 98.8 F (37.1 C)  TempSrc: Oral  Oral   SpO2: 92% 96% 97% 94%  Weight:      Height:        Intake/Output Summary (Last 24 hours) at 10/02/2020 1404 Last data filed at 10/02/2020 1300 Gross per 24 hour  Intake 495.1 ml  Output 2425 ml  Net -1929.9 ml   Physical Exam: A&Ox3, NAD CV: RRR Pulmonary: CTA Bilaterally Abdomen: Soft, Nontender, Nondistended Vascular:  Right lower extremity: Thigh soft.  Calf soft.  VAC intact and to suction.   Laboratory: CBC    Component Value Date/Time   WBC 13.3 (H) 10/01/2020 0610   HGB 12.8 (L) 10/01/2020 0610   HCT 37.2 (L) 10/01/2020 0610   PLT 232 10/01/2020 0610   BMET    Component Value Date/Time   NA 130 (L) 09/30/2020 1043   K 3.9 09/30/2020 1043   CL 93 (L) 09/30/2020 1043   CO2 27 09/30/2020 1043   GLUCOSE 257 (H) 09/30/2020 1043   BUN 16 09/30/2020 1043   CREATININE 1.13 09/30/2020 1043   CREATININE 0.98 04/13/2015 1440   CALCIUM 8.2 (L) 09/30/2020 1043   GFRNONAA >60 09/30/2020 1043   GFRAA >60 01/25/2017 1755   Assessment/Planning: The patient is a 50 year old male who presents with nonhealing wound to the right foot  1) patient was taken to the operating room over the weekend but was noted minimal bleeding to the amputation site. 2) in an attempt to improve the patient's arterial patency recommend undergoing a right lower extremity angiogram possible intervention and attempt to assess the patient's anatomy and contributing degree of atherosclerotic disease.  If appropriate, an attempt to revascularize leg made at that time.  Procedure, risks and benefits were explained to the patient.   All questions were answered.  The patient wishes to proceed.  We will plan on this tomorrow. 3) diabetes: Encouraged good control as its slows the progression of atherosclerotic disease  Discussed with Dr. Eber Hong Rodrigo Mcgranahan PA-C 10/02/2020 2:04 PM

## 2020-10-02 NOTE — Anesthesia Postprocedure Evaluation (Signed)
Anesthesia Post Note  Patient: [name]  Procedure(s) Performed: INCISION AND DRAINAGE, RIGHT FOOT (Right )  Patient location during evaluation: PACU Anesthesia Type: General Level of consciousness: awake and alert Pain management: pain level controlled Vital Signs Assessment: post-procedure vital signs reviewed and stable Respiratory status: spontaneous breathing, nonlabored ventilation and respiratory function stable Cardiovascular status: blood pressure returned to baseline and stable Postop Assessment: no apparent nausea or vomiting Anesthetic complications: no   No complications documented.   Last Vitals:  Vitals:   10/02/20 0406 10/02/20 0812  BP: 124/76 (!) 137/92  Pulse: 72 75  Resp: 16 16  Temp: (!) 36.4 C 36.9 C  SpO2: 96% 97%    Last Pain:  Vitals:   10/02/20 0812  TempSrc: Oral  PainSc:                  Tera Mater

## 2020-10-02 NOTE — Plan of Care (Signed)
  Problem: Education: Goal: Knowledge of General Education information will improve Description: Including pain rating scale, medication(s)/side effects and non-pharmacologic comfort measures Outcome: Progressing   Problem: Clinical Measurements: Goal: Diagnostic test results will improve Outcome: Progressing   Problem: Nutrition: Goal: Adequate nutrition will be maintained Outcome: Progressing   Problem: Coping: Goal: Level of anxiety will decrease Outcome: Progressing   Problem: Pain Managment: Goal: General experience of comfort will improve Outcome: Progressing   Problem: Safety: Goal: Ability to remain free from injury will improve Outcome: Progressing

## 2020-10-03 ENCOUNTER — Encounter
Admission: EM | Disposition: A | Discharge status: Home Health | Payer: Self-pay | Source: Home / Self Care | Attending: Internal Medicine

## 2020-10-03 DIAGNOSIS — R7401 Elevation of levels of liver transaminase levels: Secondary | ICD-10-CM

## 2020-10-03 DIAGNOSIS — I70261 Atherosclerosis of native arteries of extremities with gangrene, right leg: Secondary | ICD-10-CM

## 2020-10-03 DIAGNOSIS — I2510 Atherosclerotic heart disease of native coronary artery without angina pectoris: Secondary | ICD-10-CM

## 2020-10-03 DIAGNOSIS — I70249 Atherosclerosis of native arteries of left leg with ulceration of unspecified site: Secondary | ICD-10-CM

## 2020-10-03 HISTORY — PX: LOWER EXTREMITY ANGIOGRAPHY: CATH118251

## 2020-10-03 LAB — BASIC METABOLIC PANEL
Anion gap: 9 (ref 5–15)
BUN: 11 mg/dL (ref 6–20)
CO2: 28 mmol/L (ref 22–32)
Calcium: 8.4 mg/dL — ABNORMAL LOW (ref 8.9–10.3)
Chloride: 97 mmol/L — ABNORMAL LOW (ref 98–111)
Creatinine, Ser: 0.98 mg/dL (ref 0.61–1.24)
GFR, Estimated: >60 mL/min (ref >60)
Glucose, Bld: 230 mg/dL — ABNORMAL HIGH (ref 70–99)
Potassium: 4.1 mmol/L (ref 3.5–5.1)
Sodium: 134 mmol/L — ABNORMAL LOW (ref 135–145)

## 2020-10-03 LAB — GLUCOSE, CAPILLARY
Glucose-Capillary: 226 mg/dL — ABNORMAL HIGH (ref 70–99)
Glucose-Capillary: 185 mg/dL — ABNORMAL HIGH (ref 70–99)
Glucose-Capillary: 238 mg/dL — ABNORMAL HIGH (ref 70–99)
Glucose-Capillary: 90 mg/dL (ref 70–99)

## 2020-10-03 SURGERY — LOWER EXTREMITY ANGIOGRAPHY
Anesthesia: Moderate Sedation | Laterality: Right

## 2020-10-03 MED ORDER — CEFAZOLIN SODIUM-DEXTROSE 2-4 GM/100ML-% IV SOLN: 2.0000 g | Freq: Once | INTRAVENOUS | Status: AC

## 2020-10-03 MED ORDER — KETOROLAC TROMETHAMINE 30 MG/ML IJ SOLN
30.0000 mg | Freq: Once | INTRAMUSCULAR | Status: AC
  Administered 2020-10-03: 09:00:00 30 mg via INTRAVENOUS
  Filled 2020-10-03: qty 1

## 2020-10-03 MED ORDER — FENTANYL CITRATE (PF) 100 MCG/2ML IJ SOLN
INTRAMUSCULAR | Status: DC | PRN
  Administered 2020-10-03 (×2): 50 ug via INTRAVENOUS

## 2020-10-03 MED ORDER — MIDAZOLAM HCL 5 MG/5ML IJ SOLN
INTRAMUSCULAR | Status: AC
  Filled 2020-10-03: qty 5

## 2020-10-03 MED ORDER — SODIUM CHLORIDE 0.9% FLUSH
3.0000 mL | Freq: Two times a day (BID) | INTRAVENOUS | Status: DC
  Administered 2020-10-03 – 2020-10-08 (×9): 3 mL via INTRAVENOUS

## 2020-10-03 MED ORDER — LABETALOL HCL 5 MG/ML IV SOLN: 10.0000 mg | INTRAVENOUS | Status: DC | PRN

## 2020-10-03 MED ORDER — SODIUM CHLORIDE 0.9 % IV SOLN: INTRAVENOUS | Status: DC

## 2020-10-03 MED ORDER — SODIUM CHLORIDE 0.9 % IV SOLN: 250.0000 mL | INTRAVENOUS | Status: DC | PRN

## 2020-10-03 MED ORDER — DIPHENHYDRAMINE HCL 50 MG/ML IJ SOLN
INTRAMUSCULAR | Status: AC
  Filled 2020-10-03: qty 1

## 2020-10-03 MED ORDER — DIPHENHYDRAMINE HCL 50 MG/ML IJ SOLN: 50.0000 mg | Freq: Once | INTRAMUSCULAR | Status: DC | PRN

## 2020-10-03 MED ORDER — ONDANSETRON HCL 4 MG/2ML IJ SOLN: 4.0000 mg | Freq: Four times a day (QID) | INTRAMUSCULAR | Status: DC | PRN

## 2020-10-03 MED ORDER — HYDROMORPHONE HCL 1 MG/ML IJ SOLN
1.0000 mg | Freq: Once | INTRAMUSCULAR | Status: AC | PRN
  Administered 2020-10-11: 08:00:00 1 mg via INTRAVENOUS
  Filled 2020-10-03: qty 1

## 2020-10-03 MED ORDER — OXYCODONE HCL 5 MG PO TABS
5.0000 mg | ORAL_TABLET | ORAL | Status: DC | PRN
  Administered 2020-10-06 (×2): 5 mg via ORAL
  Administered 2020-10-07 – 2020-10-09 (×4): 10 mg via ORAL
  Administered 2020-10-09 – 2020-10-10 (×4): 5 mg via ORAL
  Administered 2020-10-10 – 2020-10-14 (×11): 10 mg via ORAL
  Filled 2020-10-03 (×4): qty 2
  Filled 2020-10-03: qty 1
  Filled 2020-10-03: qty 2
  Filled 2020-10-03: qty 1
  Filled 2020-10-03 (×3): qty 2
  Filled 2020-10-03: qty 1
  Filled 2020-10-03 (×5): qty 2
  Filled 2020-10-03 (×2): qty 1
  Filled 2020-10-03 (×4): qty 2

## 2020-10-03 MED ORDER — SODIUM CHLORIDE 0.9% FLUSH: 3.0000 mL | INTRAVENOUS | Status: DC | PRN

## 2020-10-03 MED ORDER — HYDRALAZINE HCL 20 MG/ML IJ SOLN: 5.0000 mg | INTRAMUSCULAR | Status: DC | PRN

## 2020-10-03 MED ORDER — MIDAZOLAM HCL 2 MG/ML PO SYRP
8.0000 mg | ORAL_SOLUTION | Freq: Once | ORAL | Status: DC | PRN
  Filled 2020-10-03: qty 4

## 2020-10-03 MED ORDER — HEPARIN SODIUM (PORCINE) 1000 UNIT/ML IJ SOLN
INTRAMUSCULAR | Status: AC
  Filled 2020-10-03: qty 1

## 2020-10-03 MED ORDER — FENTANYL CITRATE (PF) 100 MCG/2ML IJ SOLN
INTRAMUSCULAR | Status: AC
  Filled 2020-10-03: qty 2

## 2020-10-03 MED ORDER — CEFAZOLIN SODIUM-DEXTROSE 2-4 GM/100ML-% IV SOLN
INTRAVENOUS | Status: AC
  Administered 2020-10-03: 2 g via INTRAVENOUS
  Filled 2020-10-03: qty 100

## 2020-10-03 MED ORDER — ONDANSETRON HCL 4 MG/2ML IJ SOLN
4.0000 mg | Freq: Four times a day (QID) | INTRAMUSCULAR | Status: DC | PRN
Start: 2020-10-03 — End: 2020-10-05

## 2020-10-03 MED ORDER — CEFAZOLIN SODIUM-DEXTROSE 1-4 GM/50ML-% IV SOLN
1.0000 g | Freq: Once | INTRAVENOUS | Status: DC
  Filled 2020-10-03: qty 50

## 2020-10-03 MED ORDER — MIDAZOLAM HCL 2 MG/2ML IJ SOLN
INTRAMUSCULAR | Status: DC | PRN
  Administered 2020-10-03: 2 mg via INTRAVENOUS
  Administered 2020-10-03: 1 mg via INTRAVENOUS

## 2020-10-03 MED ORDER — DIPHENHYDRAMINE HCL 50 MG/ML IJ SOLN
INTRAMUSCULAR | Status: DC | PRN
  Administered 2020-10-03: 25 mg via INTRAVENOUS

## 2020-10-03 MED ORDER — SODIUM CHLORIDE 0.9 % IV SOLN: INTRAVENOUS | Status: AC

## 2020-10-03 MED ORDER — METHYLPREDNISOLONE SODIUM SUCC 125 MG IJ SOLR: 125.0000 mg | Freq: Once | INTRAMUSCULAR | Status: DC | PRN

## 2020-10-03 MED ORDER — MORPHINE SULFATE (PF) 4 MG/ML IV SOLN
2.0000 mg | INTRAVENOUS | Status: DC | PRN
  Administered 2020-10-12 – 2020-10-13 (×3): 2 mg via INTRAVENOUS
  Filled 2020-10-03 (×3): qty 1

## 2020-10-03 MED ORDER — FAMOTIDINE 20 MG PO TABS: 40.0000 mg | ORAL_TABLET | Freq: Once | ORAL | Status: DC | PRN

## 2020-10-03 MED ORDER — ACETAMINOPHEN 325 MG PO TABS
650.0000 mg | ORAL_TABLET | ORAL | Status: DC | PRN
  Administered 2020-10-09 – 2020-10-10 (×3): 650 mg via ORAL
  Filled 2020-10-03 (×2): qty 2

## 2020-10-03 MED ORDER — MELATONIN 5 MG PO TABS
5.0000 mg | ORAL_TABLET | Freq: Every day | ORAL | Status: DC
  Administered 2020-10-04 – 2020-10-11 (×7): 5 mg via ORAL
  Filled 2020-10-03 (×12): qty 1

## 2020-10-03 SURGICAL SUPPLY — 14 items
CATH ANGIO 5F PIGTAIL 65CM (CATHETERS) ×1 IMPLANT
COVER PROBE U/S 5X48 (MISCELLANEOUS) ×1 IMPLANT
DEVICE STARCLOSE SE CLOSURE (Vascular Products) ×1 IMPLANT
GLIDEWIRE ADV .035X260CM (WIRE) ×1 IMPLANT
KIT ENCORE 26 ADVANTAGE (KITS) IMPLANT
NDL ENTRY 21GA 7CM ECHOTIP (NEEDLE) IMPLANT
NEEDLE ENTRY 21GA 7CM ECHOTIP (NEEDLE) ×2 IMPLANT
PACK ANGIOGRAPHY (CUSTOM PROCEDURE TRAY) ×2 IMPLANT
SET INTRO CAPELLA COAXIAL (SET/KITS/TRAYS/PACK) ×1 IMPLANT
SHEATH BRITE TIP 5FRX11 (SHEATH) ×1 IMPLANT
SHIELD X-DRAPE GOLD 12X17 (MISCELLANEOUS) ×1 IMPLANT
SYR MEDRAD MARK 7 150ML (SYRINGE) ×1 IMPLANT
TUBING CONTRAST HIGH PRESS 72 (TUBING) ×1 IMPLANT
WIRE GUIDERIGHT .035X150 (WIRE) ×1 IMPLANT

## 2020-10-03 NOTE — Progress Notes (Signed)
   Date of Admission:  09/27/2020    ID: [name] is a 50 y.o. male with  Principal Problem:   Cellulitis in diabetic foot ([place]) Active Problems:   Coronary artery disease involving native coronary artery of native heart without angina pectoris   Uncontrolled type 2 diabetes mellitus with circulatory disorder, without long-term current use of insulin (HCC)   Essential hypertension   Hyperlipidemia   Transaminitis    Subjective: Doing better No fever  No pain  Medications:  . aspirin EC  81 mg Oral Daily  . atorvastatin  40 mg Oral Daily  . carvedilol  3.125 mg Oral BID WC  . diphenhydrAMINE      . fentaNYL      . gabapentin  600 mg Oral QHS  . heparin  5,000 Units Subcutaneous Q8H  . insulin aspart  0-15 Units Subcutaneous TID WC  . insulin aspart  0-5 Units Subcutaneous QHS  . insulin aspart  8 Units Subcutaneous TID WC  . insulin glargine  30 Units Subcutaneous Daily  . lisinopril  5 mg Oral Daily  . midazolam      . sodium chloride flush  3 mL Intravenous Q12H  . sodium chloride flush  3 mL Intravenous Q12H    Objective: Vital signs in last 24 hours: Temp:  [97.6 F (36.4 C)-98.2 F (36.8 C)] 97.6 F (36.4 C) (02/15 1252) Pulse Rate:  [62-77] 66 (02/15 1252) Resp:  [11-18] 14 (02/15 1252) BP: (117-151)/(71-95) 145/87 (02/15 1252) SpO2:  [93 %-98 %] 97 % (02/15 1252)  PHYSICAL EXAM:  General: Alert, cooperative, no distress, appears stated age.  Head: Normocephalic, without obvious abnormality, atraumatic. Eyes: Conjunctivae clear, anicteric sclerae. Pupils are equal Lungs: Clear to auscultation bilaterally. No Wheezing or Rhonchi. No rales. Heart: Regular rate and rhythm, no murmur, rub or gallop. Abdomen: Soft, non-tender,not distended. Bowel sounds normal. No masses Extremities:          Skin: No rashes or lesions. Or bruising Lymph: Cervical, supraclavicular normal. Neurologic: Grossly non-focal  Lab Results Recent Labs    10/01/20 0610  10/03/20 0651  WBC 13.3*  --   HGB 12.8*  --   HCT 37.2*  --   NA  --  134*  K  --  4.1  CL  --  97*  CO2  --  28  BUN  --  11  CREATININE  --  0.98   L   Assessment/Plan:  Diabetc foot infection secondary to trauma rt foot- s/p I/D of abscess and then 5th toe partial ray amputation on 2/13- has wound vac Doing better Plan is to close the wound in the future Group b streptococcus in the wound culture, Blood culture negative Continue Unasyn Will decide on duration depending on pathology and progress of the wound Will likely need IV for some time  DM- poorly controlled- now on insulin  CAD HTN Hyperlipidemia On coreg, lisinopril and atorvastatin  Transaminitis likely due to cirrhosis -from NASH   Discussed the management with the patient

## 2020-10-03 NOTE — Op Note (Signed)
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  Abdominal aortogram  2.  Right lower extremity distal runoff third order catheter placement  3.  Star close left common femoral    Anesthesia: Conscious sedation was administered by the interventional radiology RN under my direct supervision. IV Versed plus fentanyl were utilized. Continuous ECG, pulse oximetry and blood pressure was monitored throughout the entire procedure.  Conscious sedation was administered for a total of 40 minutes and 1 second.  Sheath: 5 French Pinnacle sheath left common femoral retrograde  Contrast: 40 cc   Fluoroscopy Time: 2.2 minutes  Indications:  The patient presents to [facility] with gangrenous changes to the right foot.  Pedal pulses are nonpalpable bilaterally suggesting atherosclerotic occlusive disease.  The risks and benefits as well as alternative therapies for lower extremity revascularization are reviewed with the patient all questions are answered the patient agrees to proceed.  The patient is therefore undergoing angiography with the hope for intervention for limb salvage.   Procedure:  Frank Goodner Howertonis a 50 y.o. male who was identified and appropriate procedural time out was performed.  The patient was then placed supine on the table and prepped and draped in the usual sterile fashion.  Ultrasound was used to evaluate the left common femoral artery.  It was echolucent and pulsatile indicating it is patent .  An ultrasound image was acquired for the permanent record.  A micropuncture needle was used to access the left common femoral artery under direct ultrasound guidance.  The  microwire was then advanced under fluoroscopic guidance without difficulty followed by the micro-sheath.  A 0.035 J wire was advanced without resistance and a 5Fr sheath was placed.    Pigtail catheter was then advanced to the level of T12 and AP projection of the aorta was obtained. Pigtail catheter was then repositioned to above the bifurcation and LAO view of the pelvis was obtained. Stiff angled Glidewire and pigtail catheter was then used across the bifurcation and the catheter was positioned in the distal external iliac artery.  RAO of the right groin was then obtained. Wire was reintroduced and negotiated into the SFA and the catheter was advanced into the SFA. Distal runoff was then performed.  After review of the images the catheter was removed over wire and an LAO view of the groin was obtained. StarClose device was deployed without difficulty.   Findings:   Aortogram: The abdominal aorta is opacified with bulging stricture contrast.  There are no hemodynamically significant stenoses noted.  There is mild calcific disease.  Bilateral renal arteries are noted they are widely patent.  The aortic bifurcation is widely patent bilateral common internal and external iliac arteries although they demonstrate mild calcific disease there are no hemodynamically significant stenoses.  Right Lower Extremity: The common femoral profunda femoris superficial femoral and popliteal are widely patent.  There is mild calcific disease noted but no hemodynamically significant stenoses.  The trifurcation is widely patent.  There is a mild 20 to 25% stenosis at the origin of the posterior tibial otherwise the posterior tibial is widely patent down to the foot filling the plantar vessels and the pedal arch.  The anterior tibial demonstrates a focal less than 30% stenosis in its proximal one third otherwise  it too is widely patent fills the dorsalis pedis which also fills the pedal arch.  Peroneal is widely patent and  actually crosses the ankle independently of the dorsalis pedis and plantar vessels.  Left Lower Extremity: The common femoral is very short with a high femoral bifurcation.  It is widely patent.  The profunda femoris is widely patent.  Visualized portions of the SFA are widely patent.  There is mild calcific disease throughout but no hemodynamically significant stenoses.  Summary: There is mild atherosclerotic disease diffusely but there are no hemodynamically significant stenoses identified.  Wound healing should be quite good he has three-vessel runoff to the foot and filling of the pedal arch via the plantar as well as dorsalis pedis vessels   Disposition: Patient was taken to the recovery room in stable condition having tolerated the procedure well.  [name] 10/03/2020,11:36 AM

## 2020-10-03 NOTE — Progress Notes (Signed)
Daily Progress Note   Subjective  - Day of Surgery F/u right foot debridment.  Underwent vascular eval.  Good flow per vascular.   Objective Vitals:   10/03/20 1200 10/03/20 1215 10/03/20 1230 10/03/20 1252  BP: (!) 141/89 134/83 135/85 (!) 145/87  Pulse: 66   66  Resp: 11 13 12 14   Temp:    97.6 F (36.4 C)  TempSrc:      SpO2: 94%   97%  Weight:      Height:        Physical Exam: Wound is stable. Erythema markedly improved. No purulence today.          Laboratory CBC    Component Value Date/Time   WBC 13.3 (H) 10/01/2020 0610   HGB 12.8 (L) 10/01/2020 0610   HCT 37.2 (L) 10/01/2020 0610   PLT 232 10/01/2020 0610    BMET    Component Value Date/Time   NA 134 (L) 10/03/2020 0651   K 4.1 10/03/2020 0651   CL 97 (L) 10/03/2020 0651   CO2 28 10/03/2020 0651   GLUCOSE 230 (H) 10/03/2020 0651   BUN 11 10/03/2020 0651   CREATININE 0.98 10/03/2020 0651   CREATININE 0.98 04/13/2015 1440   CALCIUM 8.4 (L) 10/03/2020 0651   GFRNONAA >60 10/03/2020 0651   GFRAA >60 01/25/2017 1755    Assessment/Planning: S/P debridement right foot with 5th ray amputation   Wound is stabilizing.  C/W wound vac for now.  Will likely be stable for primary closure in near future.  IV abx per ID.  Culture with group b strep  03/27/2017 A  10/03/2020, 1:46 PM

## 2020-10-03 NOTE — Interval H&P Note (Signed)
History and Physical Interval Note:  10/03/2020 10:56 AM  [name]  has presented today for surgery, with the diagnosis of Right lower extremity atherosclerotic disease with ulceration.  The various methods of treatment have been discussed with the patient and family. After consideration of risks, benefits and other options for treatment, the patient has consented to  Procedure(s): Lower Extremity Angiography (Right) as a surgical intervention.  The patient's history has been reviewed, patient examined, no change in status, stable for surgery.  I have reviewed the patient's chart and labs.  Questions were answered to the patient's satisfaction.     [name]

## 2020-10-03 NOTE — Progress Notes (Signed)
[name] at [place] NAME: Teller Wakefield    MR#:  [id number]  DATE OF BIRTH:  [birth year]  SUBJECTIVE:  feels better after foot surgery. Less pain. Denies any abdominal pain. Tolerating diet.  Tolerated surgery right foot well again on 2/13 C/o foot pain. Has wound vac+  NPO for angiogram today  REVIEW OF SYSTEMS:   Review of Systems  Constitutional: Negative for fever and weight loss.  HENT: Negative for ear discharge, ear pain and nosebleeds.   Eyes: Negative for blurred vision, pain and discharge.  Respiratory: Negative for sputum production, shortness of breath, wheezing and stridor.   Cardiovascular: Negative for chest pain, palpitations, orthopnea and PND.  Gastrointestinal: Negative for abdominal pain, diarrhea and vomiting.  Genitourinary: Negative for frequency and urgency.  Musculoskeletal: Positive for joint pain. Negative for back pain.  Neurological: Negative for sensory change, speech change, focal weakness and weakness.  Psychiatric/Behavioral: Negative for depression and hallucinations. The patient is not nervous/anxious.    Tolerating Diet:yes Tolerating PT:  pending due to wound vac  DRUG ALLERGIES:  No Known Allergies  VITALS:  Blood pressure (!) 145/87, pulse 66, temperature 97.6 F (36.4 C), resp. rate 14, height 6' 2"  (1.88 m), weight 104.3 kg, SpO2 97 %.  PHYSICAL EXAMINATION:   Physical Exam  GENERAL:  50 y.o.-year-old patient lying in the bed with no acute distress.  LUNGS: Normal breath sounds bilaterally, no wheezing, rales, rhonchi. No use of accessory muscles of respiration.  CARDIOVASCULAR: S1, S2 normal. No murmurs, rubs, or gallops.  ABDOMEN: Soft, nontender, nondistended. Bowel sounds present. No organomegaly or mass.  EXTREMITIES: bulky right foot dressing+ NEUROLOGIC: grossly nonfocal PSYCHIATRIC:  patient is alert and oriented x 3.  SKIN: as above  LABORATORY PANEL:  CBC Recent Labs   Lab 10/01/20 0610  WBC 13.3*  HGB 12.8*  HCT 37.2*  PLT 232    Chemistries  Recent Labs  Lab 09/30/20 1043 10/03/20 0651  NA 130* 134*  K 3.9 4.1  CL 93* 97*  CO2 27 28  GLUCOSE 257* 230*  BUN 16 11  CREATININE 1.13 0.98  CALCIUM 8.2* 8.4*  AST 54*  --   ALT 46*  --   ALKPHOS 199*  --   BILITOT 2.1*  --    Cardiac Enzymes No results for input(s): TROPONINI in the last 168 hours. RADIOLOGY:  PERIPHERAL VASCULAR CATHETERIZATION  Result Date: 10/03/2020 See Op Note  ASSESSMENT AND PLAN:  FULTON MERRY is a 50 y.o. male with medical history significant of CAD, hypertension, hyperlipidemia, diabetes who presents with worsening right foot wound.Patient states that 5 or 6 days ago he fell on getting out of his truck and cut his foot on a piece of metal (alluminum, not rusted) after slipping on some ice.   Sepsis POA due to Cellulitis/Diabetice foot post injury --sepsis resolved. -- Patient came in with tachycardia heart rate above 90 with elevated white count and significant right foot infection. - tdap given -  podiatry consult with Dr. Vickki Muff  --2/11--s/p debridement of multiple abscesses on the right foot on 2/11 -- 2/13--s/p partial fifth three amputation. Incision drainage multiple location dorsal and plantar right foot. Placement of antibiotic beads. Placement of wound vac -- Continue Zosyn and Linezolid--per ID consultation with Dr. Steva Ready  - Follow-up  blood culture negative -WC S Agalactiae -- vascular consultation appreciated. For angiogram tomorrow  --2/15 s/p lower extremity angiogram shows mild atherosclerotic disease. No significant blockage.  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 Dahlia Byes.  -Patient is asymptomatic.  -- Patient denies history of alcoholism. He drinks very seldom. I did discuss his CT scan results and discussed about cirrhosis with portal hypertension which could be due to his uncontrolled diabetes and NASH  DVT prophylaxis:      Heparin  Code Status:              Full  Family Communication:        none--per pt request Disposition Plan:              Patient is from:                        Home             Anticipated DC to:                   Home             Anticipated DC date:               TBD   Level of care: Med-Surg Status is: inpatient  Patient is status post angiogram today. Continues to have wound VAC in place. Follow podiatry recommendation and discuss with ID regarding IV antibiotics.      TOTAL TIME TAKING CARE OF THIS PATIENT: 25* minutes.  >50% time spent on counselling and coordination of care  Note: This dictation was prepared with Dragon dictation along with smaller phrase technology. Any transcriptional errors that result from this process are unintentional.  Fritzi Mandes M.D    [facility]   CC: Primary care physician; Coral Spikes, DOPatient ID: [name], male   DOB: 1970-09-08, 49 y.o.   MRN: [id number]

## 2020-10-03 NOTE — Progress Notes (Signed)
Pt sat up 1314. No complications. Vascular entry site dressing clean a dry. Pt educated to report any bleeding. Will continue to monitor.

## 2020-10-04 ENCOUNTER — Encounter: Payer: Self-pay | Admitting: Vascular Surgery

## 2020-10-04 DIAGNOSIS — E7800 Pure hypercholesterolemia, unspecified: Secondary | ICD-10-CM

## 2020-10-04 DIAGNOSIS — L03119 Cellulitis of unspecified part of limb: Secondary | ICD-10-CM

## 2020-10-04 DIAGNOSIS — R748 Abnormal levels of other serum enzymes: Secondary | ICD-10-CM

## 2020-10-04 DIAGNOSIS — E118 Type 2 diabetes mellitus with unspecified complications: Secondary | ICD-10-CM

## 2020-10-04 LAB — COMPREHENSIVE METABOLIC PANEL
ALT: 34 U/L (ref 0–44)
AST: 44 U/L — ABNORMAL HIGH (ref 15–41)
Albumin: 2.1 g/dL — ABNORMAL LOW (ref 3.5–5.0)
Alkaline Phosphatase: 300 U/L — ABNORMAL HIGH (ref 38–126)
Anion gap: 11 (ref 5–15)
BUN: 12 mg/dL (ref 6–20)
CO2: 26 mmol/L (ref 22–32)
Calcium: 8.4 mg/dL — ABNORMAL LOW (ref 8.9–10.3)
Chloride: 97 mmol/L — ABNORMAL LOW (ref 98–111)
Creatinine, Ser: 0.94 mg/dL (ref 0.61–1.24)
GFR, Estimated: >60 mL/min (ref >60)
Glucose, Bld: 197 mg/dL — ABNORMAL HIGH (ref 70–99)
Potassium: 4.5 mmol/L (ref 3.5–5.1)
Sodium: 134 mmol/L — ABNORMAL LOW (ref 135–145)
Total Bilirubin: 0.7 mg/dL (ref 0.3–1.2)
Total Protein: 7.7 g/dL (ref 6.5–8.1)

## 2020-10-04 LAB — CBC WITH DIFFERENTIAL/PLATELET
Abs Immature Granulocytes: 0.32 10*3/uL — ABNORMAL HIGH (ref 0.00–0.07)
Basophils Absolute: 0.1 10*3/uL (ref 0.0–0.1)
Basophils Relative: 1 %
Eosinophils Absolute: 0.2 10*3/uL (ref 0.0–0.5)
Eosinophils Relative: 2 %
HCT: 37.8 % — ABNORMAL LOW (ref 39.0–52.0)
Hemoglobin: 12.6 g/dL — ABNORMAL LOW (ref 13.0–17.0)
Immature Granulocytes: 3 %
Lymphocytes Relative: 22 %
Lymphs Abs: 2.4 10*3/uL (ref 0.7–4.0)
MCH: 32.6 pg (ref 26.0–34.0)
MCHC: 33.3 g/dL (ref 30.0–36.0)
MCV: 97.7 fL (ref 80.0–100.0)
Monocytes Absolute: 0.6 10*3/uL (ref 0.1–1.0)
Monocytes Relative: 5 %
Neutro Abs: 7.6 10*3/uL (ref 1.7–7.7)
Neutrophils Relative %: 67 %
Platelets: 296 10*3/uL (ref 150–400)
RBC: 3.87 MIL/uL — ABNORMAL LOW (ref 4.22–5.81)
RDW: 12.4 % (ref 11.5–15.5)
WBC: 11.2 10*3/uL — ABNORMAL HIGH (ref 4.0–10.5)
nRBC: 0 % (ref 0.0–0.2)

## 2020-10-04 LAB — AEROBIC/ANAEROBIC CULTURE W GRAM STAIN (SURGICAL/DEEP WOUND)

## 2020-10-04 LAB — MAGNESIUM: Magnesium: 1.9 mg/dL (ref 1.7–2.4)

## 2020-10-04 LAB — GLUCOSE, CAPILLARY
Glucose-Capillary: 162 mg/dL — ABNORMAL HIGH (ref 70–99)
Glucose-Capillary: 156 mg/dL — ABNORMAL HIGH (ref 70–99)
Glucose-Capillary: 105 mg/dL — ABNORMAL HIGH (ref 70–99)
Glucose-Capillary: 201 mg/dL — ABNORMAL HIGH (ref 70–99)

## 2020-10-04 LAB — PHOSPHORUS: Phosphorus: 3.5 mg/dL (ref 2.5–4.6)

## 2020-10-04 NOTE — Progress Notes (Signed)
PROGRESS NOTE    [name]  AJG:[id number] DOB: December 07, 1970 DOA: 09/27/2020 PCP: Coral Spikes, DO     Brief Narrative:  50 y.o. WM PMHx CAD, HTN, HLD, DM type II uncontrolled with complication (RIGHT DM foot ulcer)  Presents with worsening right foot wound.             Patient states that 5 or 6 days ago he fell on getting out of his truck and cut his foot on a piece of metal (alluminum, not rusted) after slipping on some ice.  He states his foot was normal prior to this.  He has been staying at his home since the incident.  Following this fall and cut he did note some swelling of the foot but it was not until 2 days ago that his wound became worse and began to become red hot and developed ulcerations weeping some fluid. He reports fever and chills at home.  He states he took some Tylenol for his fever at home which helped.  Does not member when his last tetanus vaccine was.  Pain currently about 3 out of 10.  Denies chest pain, shortness of breath, abdominal pain, constipation, diarrhea, nausea, vomiting.  ED Course: Vital signs in the ED significant for blood pressure in the 355H to 741U systolic, heart rate initially in the 100s improved with 90s.  Saturating well on room air.  Lab work-up showed BMP with sodium of 132 which corrects to normal considering glucose of nearly 400., chloride 96, glucose 397.  LFTs with AST 68, ALT 64, alk phos 263, T bili 2.4.  CBC with leukocytosis to 18.9.  Lactic acid normal at 1.4.  Respiratory panel for flu and Covid if pending.  Urinalysis and blood cultures pending.  Patient given a liter of fluids and started on Vanco, Flagyl, cefepime in the ED.    Subjective: A/O x4, not vaccinated does not want vaccination.   Assessment & Plan: Covid vaccination; unvaccinated   Principal Problem:   Cellulitis in diabetic foot ([place]) Active Problems:   Coronary artery disease involving native coronary artery of native heart without angina pectoris    Uncontrolled type 2 diabetes mellitus with circulatory disorder, without long-term current use of insulin ([place])   Essential hypertension   Hyperlipidemia   Transaminitis   Sepsis POA due to Cellulitis/ RIGHT Diabetic foot injury --sepsis resolved. -- Patient came in with tachycardia heart rate above 90 with elevated white count and significant right foot infection. -tdap given - podiatry consult with Dr. Vickki Muff  --2/11--s/p debridement of multiple abscesses on the right foot on 2/11 -- 2/13--s/p partial fifth three amputation. Incision drainage multiple location dorsal and plantar right foot. Placement of antibiotic beads. Placement of wound vac --Continue Zosyn and Linezolid--per ID consultation with Dr. Steva Ready  -Follow-up  blood culture negative -WC S Agalactiae -- For angiogram tomorrow  --2/15 s/p lower extremity angiogram shows mild atherosclerotic disease. No significant blockage. -Scheduled for further debridement of RIGHT foot 2/18          Diabetes Type 2 uncontrolled with Diabetic foot -2/10 Hemoglobin A1c= 11.2 -- patient only on metformin 1000 milligrams BID (home med).  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 Dahlia Byes.  -Patient is asymptomatic.  -- Patient denies history of alcoholism. He drinks very seldom. I did discuss his CT scan results and discussed about cirrhosis with portal hypertension which could be due to his uncontrolled diabetes and NASH   DVT  prophylaxis: Heparin Code Status: Full Family Communication:  Status is: Inpatient    Dispo: The patient is from: Home              Anticipated d/c is to:  Home vs SNF              Anticipated d/c date is: Per surgery              Patient currently unstable      Consultants:  Podiatry Vascular surgery   Procedures/Significant Events:    I have personally reviewed and interpreted all radiology studies and my findings are as above.  VENTILATOR SETTINGS:    Cultures   Antimicrobials: Anti-infectives (From admission, onward)   Start     Dose/Rate Route Frequency Ordered Stop   10/03/20 1045  ceFAZolin (ANCEF) IVPB 1 g/50 mL premix  Status:  Discontinued       Note to Pharmacy: To be given in specials   1 g 100 mL/hr over 30 Minutes Intravenous  Once 10/03/20 0956 10/03/20 1001   10/03/20 1045  ceFAZolin (ANCEF) IVPB 2g/100 mL premix       Note to Pharmacy: To be given in specials   2 g 200 mL/hr over 30 Minutes Intravenous  Once 10/03/20 1001 10/03/20 1245   10/02/20 1800  Ampicillin-Sulbactam (UNASYN) 3 g in sodium chloride 0.9 % 100 mL IVPB        3 g 200 mL/hr over 30 Minutes Intravenous Every 6 hours 10/02/20 1628     09/28/20 1800  vancomycin (VANCOREADY) IVPB 1500 mg/300 mL  Status:  Discontinued        1,500 mg 150 mL/hr over 120 Minutes Intravenous Every 12 hours 09/28/20 0915 09/28/20 1553   09/28/20 1800  linezolid (ZYVOX) IVPB 600 mg  Status:  Discontinued        600 mg 300 mL/hr over 60 Minutes Intravenous Every 12 hours 09/28/20 1553 10/02/20 1625   09/28/20 1700  piperacillin-tazobactam (ZOSYN) IVPB 3.375 g  Status:  Discontinued        3.375 g 12.5 mL/hr over 240 Minutes Intravenous Every 8 hours 09/28/20 1553 10/02/20 1624   09/28/20 0600  ceFEPIme (MAXIPIME) 2 g in sodium chloride 0.9 % 100 mL IVPB  Status:  Discontinued        2 g 200 mL/hr over 30 Minutes Intravenous Every 8 hours 09/28/20 0216 09/28/20 1553   09/28/20 0500  vancomycin  (VANCOREADY) IVPB 1750 mg/350 mL  Status:  Discontinued        1,750 mg 175 mL/hr over 120 Minutes Intravenous Every 12 hours 09/28/20 0222 09/28/20 0915   09/28/20 0200  metroNIDAZOLE (FLAGYL) IVPB 500 mg  Status:  Discontinued        500 mg 100 mL/hr over 60 Minutes Intravenous Every 8 hours 09/27/20 2320 09/28/20 1552   09/27/20 2130  ceFEPIme (MAXIPIME) 2 g in sodium chloride 0.9 % 100 mL IVPB        2 g 200 mL/hr over 30 Minutes Intravenous  Once 09/27/20 2120 09/27/20 2310   09/27/20  2130  metroNIDAZOLE (FLAGYL) IVPB 500 mg  Status:  Discontinued        500 mg 100 mL/hr over 60 Minutes Intravenous  Once 09/27/20 2120 09/28/20 0122   09/27/20 2130  vancomycin (VANCOCIN) IVPB 1000 mg/200 mL premix        1,000 mg 200 mL/hr over 60 Minutes Intravenous  Once 09/27/20 2120 09/28/20 0106       Devices    LINES / TUBES:      Continuous Infusions: . sodium chloride    . ampicillin-sulbactam (UNASYN) IV 3 g (10/04/20 0535)     Objective: Vitals:   10/03/20 1623 10/03/20 1935 10/04/20 0010 10/04/20 0730  BP: 129/72 140/85 131/86 (!) 150/91  Pulse: 73 78 69 70  Resp: 18 18 20 18   Temp: 98 F (36.7 C) 98.9 F (37.2 C) 98.2 F (36.8 C) 97.8 F (36.6 C)  TempSrc:      SpO2: 97% 97% 97% 99%  Weight:      Height:        Intake/Output Summary (Last 24 hours) at 10/04/2020 4656 Last data filed at 10/04/2020 0830 Gross per 24 hour  Intake --  Output 2175 ml  Net -2175 ml   Filed Weights   09/27/20 1859 09/29/20 1000  Weight: 104.3 kg 104.3 kg    Examination:  General: A/O x4, No acute respiratory distress Eyes: negative scleral hemorrhage, negative anisocoria, negative icterus ENT: Negative Runny nose, negative gingival bleeding, Neck:  Negative scars, masses, torticollis, lymphadenopathy, JVD Lungs: Clear to auscultation bilaterally without wheezes or crackles Cardiovascular: Regular rate and rhythm without murmur gallop or rub normal S1 and S2 Abdomen:  negative abdominal pain, nondistended, positive soft, bowel sounds, no rebound, no ascites, no appreciable mass Extremities: see photos above of RIGHT foot diabetic ulcer Skin: see photos above of RIGHT foot diabetic ulcer Psychiatric:  Negative depression, negative anxiety, negative fatigue, negative mania  Central nervous system:  Cranial nerves II through XII intact, tongue/uvula midline, all extremities muscle strength 5/5, sensation intact throughout, negative dysarthria, negative expressive aphasia, negative receptive aphasia.  .     Data Reviewed: Care during the described time interval was provided by me .  I have reviewed this patient's available data, including medical history, events of note, physical examination, and all test results as part of my evaluation.  CBC: Recent Labs  Lab 09/27/20 1902 09/28/20 0516 09/30/20 1043 10/01/20 0610  WBC 18.9* 16.4* 14.3* 13.3*  NEUTROABS 16.1*  --   --   --   HGB 14.4 13.0 12.8* 12.8*  HCT 40.2 37.4* 36.3* 37.2*  MCV 92.2 94.2 95.3 94.4  PLT 224 211 197 812   Basic Metabolic Panel: Recent Labs  Lab 09/27/20 1902 09/28/20 0516 09/30/20 1043 10/03/20 0651  NA 132* 130* 130* 134*  K 4.3 3.8 3.9 4.1  CL 96* 94* 93* 97*  CO2 25 25 27 28   GLUCOSE 397* 432* 257* 230*  BUN 18 19 16 11   CREATININE 0.98 1.11 1.13 0.98  CALCIUM 9.1 8.7* 8.2* 8.4*   GFR: Estimated Creatinine Clearance: 116.1 mL/min (by C-G formula based on SCr of 0.98 mg/dL). Liver Function Tests: Recent Labs  Lab 09/27/20 1902 09/28/20 0516 09/30/20 1043  AST 68* 63* 54*  ALT 64* 57* 46*  ALKPHOS 236* 203* 199*  BILITOT 2.4* 2.5* 2.1*  PROT 7.3 6.4* 6.6  ALBUMIN 2.6* 2.2* 1.9*   No results for input(s): LIPASE, AMYLASE in the last 168 hours. No results for input(s): AMMONIA in  the last 168 hours. Coagulation Profile: Recent Labs  Lab 09/30/20 1051  INR 1.2   Cardiac Enzymes: No results for input(s): CKTOTAL, CKMB, CKMBINDEX, TROPONINI in the  last 168 hours. BNP (last 3 results) No results for input(s): PROBNP in the last 8760 hours. HbA1C: No results for input(s): HGBA1C in the last 72 hours. CBG: Recent Labs  Lab 10/03/20 0752 10/03/20 1006 10/03/20 1624 10/03/20 2058 10/04/20 0804  GLUCAP 226* 185* 238* 90 162*   Lipid Profile: No results for input(s): CHOL, HDL, LDLCALC, TRIG, CHOLHDL, LDLDIRECT in the last 72 hours. Thyroid Function Tests: No results for input(s): TSH, T4TOTAL, FREET4, T3FREE, THYROIDAB in the last 72 hours. Anemia Panel: No results for input(s): VITAMINB12, FOLATE, FERRITIN, TIBC, IRON, RETICCTPCT in the last 72 hours. Sepsis Labs: Recent Labs  Lab 09/27/20 1902  LATICACIDVEN 1.4    Recent Results (from the past 240 hour(s))  Culture, blood (routine x 2)     Status: None   Collection Time: 09/27/20  9:48 PM   Specimen: BLOOD  Result Value Ref Range Status   Specimen Description BLOOD RIGHT ANTECUBITAL  Final   Special Requests   Final    BOTTLES DRAWN AEROBIC AND ANAEROBIC Blood Culture adequate volume   Culture   Final    NO GROWTH 5 DAYS Performed at [facility], [address] [place], [place] [address]    Report Status 10/02/2020 FINAL  Final  Resp Panel by RT-PCR (Flu A&B, Covid) Nasopharyngeal Swab     Status: None   Collection Time: 09/27/20  9:48 PM   Specimen: Nasopharyngeal Swab; Nasopharyngeal(NP) swabs in vial transport medium  Result Value Ref Range Status   SARS Coronavirus 2 by RT PCR NEGATIVE NEGATIVE Final    Comment: (NOTE) SARS-CoV-2 target nucleic acids are NOT DETECTED.  The SARS-CoV-2 RNA is generally detectable in upper respiratory specimens during the acute phase of infection. The lowest concentration of SARS-CoV-2 viral copies this assay can detect is 138 copies/mL. A negative result does not preclude SARS-Cov-2 infection and should not be used as the sole basis for treatment or other patient management decisions. A negative result may occur  with  improper specimen collection/handling, submission of specimen other than nasopharyngeal swab, presence of viral mutation(s) within the areas targeted by this assay, and inadequate number of viral copies(<138 copies/mL). A negative result must be combined with clinical observations, patient history, and epidemiological information. The expected result is Negative.  Fact Sheet for Patients:  EntrepreneurPulse.com.au  Fact Sheet for Healthcare Providers:  IncredibleEmployment.be  This test is no t yet approved or cleared by the [place] FDA and  has been authorized for detection and/or diagnosis of SARS-CoV-2 by FDA under an Emergency Use Authorization (EUA). This EUA will remain  in effect (meaning this test can be used) for the duration of the COVID-19 declaration under Section 564(b)(1) of the Act, 21 U.S.C.section 360bbb-3(b)(1), unless the authorization is terminated  or revoked sooner.       Influenza A by PCR NEGATIVE NEGATIVE Final   Influenza B by PCR NEGATIVE NEGATIVE Final    Comment: (NOTE) The Xpert Xpress SARS-CoV-2/FLU/RSV plus assay is intended as an aid in the diagnosis of influenza from Nasopharyngeal swab specimens and should not be used as a sole basis for treatment. Nasal washings and aspirates are unacceptable for Xpert Xpress SARS-CoV-2/FLU/RSV testing.  Fact Sheet for Patients: EntrepreneurPulse.com.au  Fact Sheet for Healthcare Providers: IncredibleEmployment.be  This test is not yet approved or cleared by the  [place] Architectural technologist and has been authorized for detection and/or diagnosis of SARS-CoV-2 by FDA under an Print production planner (EUA). This EUA will remain in effect (meaning this test can be used) for the duration of the COVID-19 declaration under Section 564(b)(1) of the Act, 21 U.S.C. section 360bbb-3(b)(1), unless the authorization is terminated  or revoked.  Performed at [facility], [address] [place], [place] [address]   Culture, blood (routine x 2)     Status: None   Collection Time: 09/27/20 10:31 PM   Specimen: BLOOD  Result Value Ref Range Status   Specimen Description BLOOD LEFT ANTECUBITAL  Final   Special Requests   Final    BOTTLES DRAWN AEROBIC AND ANAEROBIC Blood Culture adequate volume   Culture   Final    NO GROWTH 5 DAYS Performed at [facility], [address], [place], [place] [address]    Report Status 10/02/2020 FINAL  Final  Aerobic/Anaerobic Culture (surgical/deep wound)     Status: None (Preliminary result)   Collection Time: 09/29/20 10:49 AM   Specimen: Wound  Result Value Ref Range Status   Specimen Description   Final    WOUND Performed at [facility], [address], [place], [place] [address]    Special Requests   Final    right foot culture Performed at [facility], [address] [place], [place] [address]    Gram Stain   Final    RARE WBC PRESENT,BOTH PMN AND MONONUCLEAR RARE GRAM POSITIVE COCCI IN PAIRS Performed at [facility], [place] [address], [place], [place] [address]    Culture   Final    RARE GROUP B STREP(S.AGALACTIAE)ISOLATED TESTING AGAINST S. AGALACTIAE NOT ROUTINELY PERFORMED DUE TO PREDICTABILITY OF AMP/PEN/VAN SUSCEPTIBILITY. NO ANAEROBES ISOLATED; CULTURE IN PROGRESS FOR 5 DAYS    Report Status PENDING  Incomplete         Radiology Studies: PERIPHERAL VASCULAR CATHETERIZATION  Result Date: 10/03/2020 See Op Note       Scheduled Meds: . aspirin EC  81 mg Oral Daily  . atorvastatin  40 mg Oral Daily  . carvedilol  3.125 mg Oral BID WC  . gabapentin  600 mg Oral QHS  . heparin  5,000 Units Subcutaneous Q8H  . insulin aspart  0-15 Units Subcutaneous TID WC  . insulin aspart  0-5 Units Subcutaneous QHS  . insulin aspart  8 Units Subcutaneous TID WC  . insulin glargine  30 Units Subcutaneous Daily   . lisinopril  5 mg Oral Daily  . melatonin  5 mg Oral QHS  . sodium chloride flush  3 mL Intravenous Q12H  . sodium chloride flush  3 mL Intravenous Q12H   Continuous Infusions: . sodium chloride    . ampicillin-sulbactam (UNASYN) IV 3 g (10/04/20 0535)     LOS: 6 days    Time spent:40 min    Raylynne Cubbage, Geraldo Docker, MD [facility]   If 7PM-7AM, please contact night-coverage 10/04/2020, 9:25 AM

## 2020-10-04 NOTE — Progress Notes (Signed)
PODIATRY / FOOT AND ANKLE SURGERY PROGRESS NOTE  Reason for consult: Right foot infection    HPI: [name] is a 50 y.o. male who presents with who presents status post 3d right partial fifth ray amputation with incision and drainage with application of antibiotic beads and wound VAC.  Patient has remained nonweightbearing since procedure.  Patient presents today resting in bed comfortably with wound VAC on and in place over the procedure site with no drainage through the dressing.  PMHx:  Past Medical History:  Diagnosis Date  . Diabetes mellitus without complication (HCC)   . Heart disease   . Myocardial infarction [facility])     Surgical Hx:  Past Surgical History:  Procedure Laterality Date  . AMPUTATION Right 10/01/2020   Procedure: AMPUTATION 5th  RAY AND IRRIGATION AND DEBRIDEMENT;  Surgeon: Gwyneth Revels, DPM;  Location: [facility] ORS;  Service: Podiatry;  Laterality: Right;  . BACK SURGERY    . INCISION AND DRAINAGE Right 09/29/2020   Procedure: INCISION AND DRAINAGE, RIGHT FOOT;  Surgeon: Gwyneth Revels, DPM;  Location: [facility] ORS;  Service: Podiatry;  Laterality: Right;  . LOWER EXTREMITY ANGIOGRAPHY Right 10/03/2020   Procedure: Lower Extremity Angiography;  Surgeon: Renford Dills, MD;  Location: [facility] INVASIVE CV LAB;  Service: Cardiovascular;  Laterality: Right;    FHx:  Family History  Problem Relation Age of Onset  . Diabetes Father   . Heart disease Father   . Breast cancer Mother   . Lung cancer Paternal Grandmother     Social History:  reports that he has been smoking cigarettes. He has been smoking about 1.00 pack per day. He has never used smokeless tobacco. He reports current alcohol use. He reports that he does not use drugs.  Allergies: No Known Allergies  Medications Prior to Admission  Medication Sig Dispense Refill  . atorvastatin (LIPITOR) 40 MG tablet Take 1 tablet (40 mg total) by mouth daily. 90 tablet 3  . Blood Glucose Monitoring Suppl (RELION  PRIME MONITOR) DEVI Use up to 4 times daily to check blood sugars. 1 Device 0  . clopidogrel (PLAVIX) 75 MG tablet TAKE ONE TABLET BY MOUTH ONCE DAILY (Patient not taking: Reported on 09/28/2020) 30 tablet 0  . empagliflozin (JARDIANCE) 10 MG TABS tablet Take 10 mg by mouth daily. (Patient not taking: Reported on 09/28/2020) 90 tablet 0  . gabapentin (NEURONTIN) 300 MG capsule Take 600 mg by mouth at bedtime as needed.    Marland Kitchen glipiZIDE (GLUCOTROL XL) 10 MG 24 hr tablet Take 10 mg by mouth daily.    Marland Kitchen glucose blood (RELION PRIME TEST) test strip Use as instructed 100 each 12  . lisinopril (PRINIVIL,ZESTRIL) 5 MG tablet TAKE ONE TABLET BY MOUTH ONCE DAILY 30 tablet 0  . meloxicam (MOBIC) 7.5 MG tablet Take 7.5 mg by mouth 2 (two) times daily.    . metFORMIN (GLUCOPHAGE) 500 MG tablet TAKE ONE TABLET BY MOUTH TWICE DAILY WITH A MEAL 30 tablet 0  . ReliOn Ultra Thin Lancets MISC Use as directed to take blood sugar. 200 each 0  . sildenafil (VIAGRA) 25 MG tablet Take 1 tablet by mouth daily.    Marland Kitchen sulfamethoxazole-trimethoprim (BACTRIM DS,SEPTRA DS) 800-160 MG tablet Take 1 tablet by mouth 2 (two) times daily. (Patient not taking: Reported on 09/28/2020) 20 tablet 0    Physical Exam: General: Alert and oriented.  No apparent distress.  Vascular: DP/PT pulses faintly palpable bilateral, no hair growth noted to digits bilateral.  Capillary fill  time appears to be intact to both feet.  Neuro: Light touch sensation reduced to bilateral lower extremities.   Derm: Wound VAC in place to the right fifth metatarsal amputation site, appears to be well adhered at this time with no leakage.  No active drainage noted.  Erythema and edema appear to be reduced overall compared to preoperative clinical pictures that were taken.  MSK: Right partial fifth ray amputation.  Limited ankle joint dorsiflexion noted with the knee extended but mildly improved with knee flexion bilateral.  Results for orders placed or performed  during the hospital encounter of 09/27/20 (from the past 48 hour(s))  Glucose, capillary     Status: Abnormal   Collection Time: 10/02/20  4:01 PM  Result Value Ref Range   Glucose-Capillary 109 (H) 70 - 99 mg/dL    Comment: Glucose reference range applies only to samples taken after fasting for at least 8 hours.  Glucose, capillary     Status: Abnormal   Collection Time: 10/02/20  9:51 PM  Result Value Ref Range   Glucose-Capillary 107 (H) 70 - 99 mg/dL    Comment: Glucose reference range applies only to samples taken after fasting for at least 8 hours.  Basic metabolic panel     Status: Abnormal   Collection Time: 10/03/20  6:51 AM  Result Value Ref Range   Sodium 134 (L) 135 - 145 mmol/L   Potassium 4.1 3.5 - 5.1 mmol/L   Chloride 97 (L) 98 - 111 mmol/L   CO2 28 22 - 32 mmol/L   Glucose, Bld 230 (H) 70 - 99 mg/dL    Comment: Glucose reference range applies only to samples taken after fasting for at least 8 hours.   BUN 11 6 - 20 mg/dL   Creatinine, Ser 5.42 0.61 - 1.24 mg/dL   Calcium 8.4 (L) 8.9 - 10.3 mg/dL   GFR, Estimated >70 >62 mL/min    Comment: (NOTE) Calculated using the CKD-EPI Creatinine Equation (2021)    Anion gap 9 5 - 15    Comment: Performed at [facility], [address] [place], [place] [address]  Glucose, capillary     Status: Abnormal   Collection Time: 10/03/20  7:52 AM  Result Value Ref Range   Glucose-Capillary 226 (H) 70 - 99 mg/dL    Comment: Glucose reference range applies only to samples taken after fasting for at least 8 hours.  Glucose, capillary     Status: Abnormal   Collection Time: 10/03/20 10:06 AM  Result Value Ref Range   Glucose-Capillary 185 (H) 70 - 99 mg/dL    Comment: Glucose reference range applies only to samples taken after fasting for at least 8 hours.  Glucose, capillary     Status: Abnormal   Collection Time: 10/03/20  4:24 PM  Result Value Ref Range   Glucose-Capillary 238 (H) 70 - 99 mg/dL    Comment: Glucose  reference range applies only to samples taken after fasting for at least 8 hours.  Glucose, capillary     Status: None   Collection Time: 10/03/20  8:58 PM  Result Value Ref Range   Glucose-Capillary 90 70 - 99 mg/dL    Comment: Glucose reference range applies only to samples taken after fasting for at least 8 hours.  Glucose, capillary     Status: Abnormal   Collection Time: 10/04/20  8:04 AM  Result Value Ref Range   Glucose-Capillary 162 (H) 70 - 99 mg/dL    Comment: Glucose reference  range applies only to samples taken after fasting for at least 8 hours.  Comprehensive metabolic panel     Status: Abnormal   Collection Time: 10/04/20  9:50 AM  Result Value Ref Range   Sodium 134 (L) 135 - 145 mmol/L   Potassium 4.5 3.5 - 5.1 mmol/L   Chloride 97 (L) 98 - 111 mmol/L   CO2 26 22 - 32 mmol/L   Glucose, Bld 197 (H) 70 - 99 mg/dL    Comment: Glucose reference range applies only to samples taken after fasting for at least 8 hours.   BUN 12 6 - 20 mg/dL   Creatinine, Ser 4.19 0.61 - 1.24 mg/dL   Calcium 8.4 (L) 8.9 - 10.3 mg/dL   Total Protein 7.7 6.5 - 8.1 g/dL   Albumin 2.1 (L) 3.5 - 5.0 g/dL   AST 44 (H) 15 - 41 U/L   ALT 34 0 - 44 U/L   Alkaline Phosphatase 300 (H) 38 - 126 U/L   Total Bilirubin 0.7 0.3 - 1.2 mg/dL   GFR, Estimated >37 >90 mL/min    Comment: (NOTE) Calculated using the CKD-EPI Creatinine Equation (2021)    Anion gap 11 5 - 15    Comment: Performed at [facility], [address] [place], [place] [address]  Magnesium     Status: None   Collection Time: 10/04/20  9:50 AM  Result Value Ref Range   Magnesium 1.9 1.7 - 2.4 mg/dL    Comment: Performed at [facility], [address] [place], [place] [address]  Phosphorus     Status: None   Collection Time: 10/04/20  9:50 AM  Result Value Ref Range   Phosphorus 3.5 2.5 - 4.6 mg/dL    Comment: Performed at [facility], [address] [place], [place] [address]  CBC with  Differential/Platelet     Status: Abnormal   Collection Time: 10/04/20  9:50 AM  Result Value Ref Range   WBC 11.2 (H) 4.0 - 10.5 K/uL   RBC 3.87 (L) 4.22 - 5.81 MIL/uL   Hemoglobin 12.6 (L) 13.0 - 17.0 g/dL   HCT 83.4 (L) 19.6 - 22.2 %   MCV 97.7 80.0 - 100.0 fL   MCH 32.6 26.0 - 34.0 pg   MCHC 33.3 30.0 - 36.0 g/dL   RDW 97.9 89.2 - 11.9 %   Platelets 296 150 - 400 K/uL   nRBC 0.0 0.0 - 0.2 %   Neutrophils Relative % 67 %   Neutro Abs 7.6 1.7 - 7.7 K/uL   Lymphocytes Relative 22 %   Lymphs Abs 2.4 0.7 - 4.0 K/uL   Monocytes Relative 5 %   Monocytes Absolute 0.6 0.1 - 1.0 K/uL   Eosinophils Relative 2 %   Eosinophils Absolute 0.2 0.0 - 0.5 K/uL   Basophils Relative 1 %   Basophils Absolute 0.1 0.0 - 0.1 K/uL   Immature Granulocytes 3 %   Abs Immature Granulocytes 0.32 (H) 0.00 - 0.07 K/uL    Comment: Performed at [facility], [address] [place], [place] [address]  Glucose, capillary     Status: Abnormal   Collection Time: 10/04/20 12:12 PM  Result Value Ref Range   Glucose-Capillary 156 (H) 70 - 99 mg/dL    Comment: Glucose reference range applies only to samples taken after fasting for at least 8 hours.   PERIPHERAL VASCULAR CATHETERIZATION  Result Date: 10/03/2020 See Op Note   Blood pressure (!) 141/92, pulse 67, temperature  97.7 F (36.5 C), resp. rate 16, height 6\' 2"  (1.88 m), weight 104.3 kg, SpO2 99 %.  Assessment 1. 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 Diabetes type 2 polyneuropathy 3. PVD 4. History of tobacco abuse/smoking 5. Equinus  Plan -Patient seen and examined. -Wound VAC appears to be fairly well stuck in place overall and appears to be suctioning well over the procedure site. -Continue with VAC changes until revision surgical procedure. -Discussed with patient that he needs to go further surgical intervention due to some  necrosis of areas of the wound as well as need for skin flap creation to cover the void that is present from the previous amputation due to infection.  Discussed all treatment options with patient both conservative and surgical attempts at correction clean potential risks and complications and at this time patient has elected to undergo further surgical intervention consisting of right transmetatarsal amputation with tendo Achilles lengthening. -Orders placed in chart for upcoming procedure.  Plan for procedure at some point after 4 PM on 10/06/2020.  Patient to be n.p.o. at 8 AM that day in preparation for surgery. -Recommend holding off on heparin at least 12 to 24 hours prior to surgery for hemostasis control.  Can restart likely after 24 hours postop. -Appreciate vascular recommendations. -Appreciate antibiotic therapy recommendations per medicine.  Previous culture growing group B strep.  Appreciate infectious disease recommendations. -Patient to continue staying off the right foot is much as possible and to use heel for transfers only.  Rosetta Posner, DPM 10/04/2020, 1:07 PM

## 2020-10-04 NOTE — Progress Notes (Signed)
[facility] Daily Progress Note   Subjective: 10/03/20:             1.  Abdominal aortogram             2.  Right lower extremity distal runoff third order catheter placement             3.  Star close left common femoral  Patient without complaint.  No issues overnight.  Objective: Vitals:   10/03/20 1935 10/04/20 0010 10/04/20 0730 10/04/20 1142  BP: 140/85 131/86 (!) 150/91 (!) 141/92  Pulse: 78 69 70 67  Resp: 18 20 18 16   Temp: 98.9 F (37.2 C) 98.2 F (36.8 C) 97.8 F (36.6 C) 97.7 F (36.5 C)  TempSrc:      SpO2: 97% 97% 99% 99%  Weight:      Height:        Intake/Output Summary (Last 24 hours) at 10/04/2020 1205 Last data filed at 10/04/2020 1146 Gross per 24 hour  Intake 360 ml  Output 2725 ml  Net -2365 ml   Physical Exam: A&Ox3, NAD CV: RRR Pulmonary: CTA Bilaterally Abdomen: Soft, Nontender, Nondistended Left groin:  Access site: Clean dry and intact Vascular:  Right lower extremity: Thigh soft.  Calf soft.  Extremities are distally.   Laboratory: CBC    Component Value Date/Time   WBC 11.2 (H) 10/04/2020 0950   HGB 12.6 (L) 10/04/2020 0950   HCT 37.8 (L) 10/04/2020 0950   PLT 296 10/04/2020 0950   BMET    Component Value Date/Time   NA 134 (L) 10/04/2020 0950   K 4.5 10/04/2020 0950   CL 97 (L) 10/04/2020 0950   CO2 26 10/04/2020 0950   GLUCOSE 197 (H) 10/04/2020 0950   BUN 12 10/04/2020 0950   CREATININE 0.94 10/04/2020 0950   CREATININE 0.98 04/13/2015 1440   CALCIUM 8.4 (L) 10/04/2020 0950   GFRNONAA >60 10/04/2020 0950   GFRAA >60 01/25/2017 1755   Assessment/Planning: The patient is a 50 year old male who presented with chronic foot wounds status post angiography  1) on aspirin, Plavix and statin for medical management 2) will continue to surveilled the patient's disease in the outpatient setting  Discussed with Dr. Eber Hong Peyton Spengler PA-C 10/04/2020 12:05 PM

## 2020-10-05 DIAGNOSIS — B951 Streptococcus, group B, as the cause of diseases classified elsewhere: Secondary | ICD-10-CM

## 2020-10-05 LAB — COMPREHENSIVE METABOLIC PANEL
ALT: 28 U/L (ref 0–44)
AST: 35 U/L (ref 15–41)
Albumin: 2 g/dL — ABNORMAL LOW (ref 3.5–5.0)
Alkaline Phosphatase: 263 U/L — ABNORMAL HIGH (ref 38–126)
Anion gap: 9 (ref 5–15)
BUN: 13 mg/dL (ref 6–20)
CO2: 28 mmol/L (ref 22–32)
Calcium: 8.6 mg/dL — ABNORMAL LOW (ref 8.9–10.3)
Chloride: 97 mmol/L — ABNORMAL LOW (ref 98–111)
Creatinine, Ser: 0.83 mg/dL (ref 0.61–1.24)
GFR, Estimated: >60 mL/min (ref >60)
Glucose, Bld: 241 mg/dL — ABNORMAL HIGH (ref 70–99)
Potassium: 4.1 mmol/L (ref 3.5–5.1)
Sodium: 134 mmol/L — ABNORMAL LOW (ref 135–145)
Total Bilirubin: 0.7 mg/dL (ref 0.3–1.2)
Total Protein: 7.2 g/dL (ref 6.5–8.1)

## 2020-10-05 LAB — GLUCOSE, CAPILLARY
Glucose-Capillary: 219 mg/dL — ABNORMAL HIGH (ref 70–99)
Glucose-Capillary: 221 mg/dL — ABNORMAL HIGH (ref 70–99)
Glucose-Capillary: 173 mg/dL — ABNORMAL HIGH (ref 70–99)
Glucose-Capillary: 166 mg/dL — ABNORMAL HIGH (ref 70–99)

## 2020-10-05 LAB — CBC WITH DIFFERENTIAL/PLATELET
Abs Immature Granulocytes: 0.23 10*3/uL — ABNORMAL HIGH (ref 0.00–0.07)
Basophils Absolute: 0.1 10*3/uL (ref 0.0–0.1)
Basophils Relative: 1 %
Eosinophils Absolute: 0.2 10*3/uL (ref 0.0–0.5)
Eosinophils Relative: 2 %
HCT: 36.8 % — ABNORMAL LOW (ref 39.0–52.0)
Hemoglobin: 12.3 g/dL — ABNORMAL LOW (ref 13.0–17.0)
Immature Granulocytes: 3 %
Lymphocytes Relative: 26 %
Lymphs Abs: 2.4 10*3/uL (ref 0.7–4.0)
MCH: 32.2 pg (ref 26.0–34.0)
MCHC: 33.4 g/dL (ref 30.0–36.0)
MCV: 96.3 fL (ref 80.0–100.0)
Monocytes Absolute: 0.5 10*3/uL (ref 0.1–1.0)
Monocytes Relative: 5 %
Neutro Abs: 5.9 10*3/uL (ref 1.7–7.7)
Neutrophils Relative %: 63 %
Platelets: 293 10*3/uL (ref 150–400)
RBC: 3.82 MIL/uL — ABNORMAL LOW (ref 4.22–5.81)
RDW: 12.3 % (ref 11.5–15.5)
WBC: 9.2 10*3/uL (ref 4.0–10.5)
nRBC: 0 % (ref 0.0–0.2)

## 2020-10-05 LAB — LIPID PANEL
Cholesterol: 140 mg/dL (ref 0–200)
HDL: 23 mg/dL — ABNORMAL LOW (ref >40)
LDL Cholesterol: 67 mg/dL (ref 0–99)
Total CHOL/HDL Ratio: 6.1 RATIO
Triglycerides: 249 mg/dL — ABNORMAL HIGH (ref <150)
VLDL: 50 mg/dL — ABNORMAL HIGH (ref 0–40)

## 2020-10-05 LAB — PHOSPHORUS: Phosphorus: 3.9 mg/dL (ref 2.5–4.6)

## 2020-10-05 LAB — MAGNESIUM: Magnesium: 1.8 mg/dL (ref 1.7–2.4)

## 2020-10-05 MED ORDER — CHLORHEXIDINE GLUCONATE 4 % EX LIQD: 60.0000 mL | Freq: Once | CUTANEOUS | Status: DC

## 2020-10-05 MED ORDER — POVIDONE-IODINE 10 % EX SWAB: 2.0000 "application " | Freq: Once | CUTANEOUS | Status: DC

## 2020-10-05 MED ORDER — CEFAZOLIN SODIUM-DEXTROSE 2-4 GM/100ML-% IV SOLN
2.0000 g | INTRAVENOUS | Status: AC
  Filled 2020-10-05: qty 100

## 2020-10-05 NOTE — Progress Notes (Signed)
PROGRESS NOTE    [name]  EZM:[id number] DOB: 1970-10-24 DOA: 09/27/2020 PCP: Coral Spikes, DO     Brief Narrative:  50 y.o. WM PMHx CAD, HTN, HLD, DM type II uncontrolled with complication (RIGHT DM foot ulcer)  Presents with worsening right foot wound.             Patient states that 5 or 6 days ago he fell on getting out of his truck and cut his foot on a piece of metal (alluminum, not rusted) after slipping on some ice.  He states his foot was normal prior to this.  He has been staying at his home since the incident.  Following this fall and cut he did note some swelling of the foot but it was not until 2 days ago that his wound became worse and began to become red hot and developed ulcerations weeping some fluid. He reports fever and chills at home.  He states he took some Tylenol for his fever at home which helped.  Does not member when his last tetanus vaccine was.  Pain currently about 3 out of 10.  Denies chest pain, shortness of breath, abdominal pain, constipation, diarrhea, nausea, vomiting.  ED Course: Vital signs in the ED significant for blood pressure in the 503T to 465K systolic, heart rate initially in the 100s improved with 90s.  Saturating well on room air.  Lab work-up showed BMP with sodium of 132 which corrects to normal considering glucose of nearly 400., chloride 96, glucose 397.  LFTs with AST 68, ALT 64, alk phos 263, T bili 2.4.  CBC with leukocytosis to 18.9.  Lactic acid normal at 1.4.  Respiratory panel for flu and Covid if pending.  Urinalysis and blood cultures pending.  Patient given a liter of fluids and started on Vanco, Flagyl, cefepime in the ED.    Subjective: 2/17 afebrile overnight, A/O x4, sitting on edge of bed.  Anxious to have surgery.   Assessment & Plan: Covid vaccination; unvaccinated   Principal Problem:   Cellulitis in diabetic foot ([place]) Active Problems:   Coronary artery disease involving native coronary artery of native heart  without angina pectoris   Uncontrolled type 2 diabetes mellitus with circulatory disorder, without long-term current use of insulin ([employer])   Essential hypertension   Hyperlipidemia   Transaminitis   Sepsis POA due to Cellulitis/ RIGHT Diabetic foot injury --sepsis resolved. -- Patient came in with tachycardia heart rate above 90 with elevated white count and significant right foot infection. -tdap given - podiatry consult with Dr. Vickki Muff  --2/11--s/p debridement of multiple abscesses on the right foot on 2/11 -- 2/13--s/p partial fifth three amputation. Incision drainage multiple location dorsal and plantar right foot. Placement of antibiotic beads. Placement of wound vac --Continue Zosyn and Linezolid--per ID consultation with Dr. Steva Ready  -Follow-up  blood culture negative -WC S Agalactiae -- For angiogram tomorrow  --2/15 s/p lower extremity angiogram shows mild atherosclerotic disease. No significant blockage. -Scheduled for further debridement of RIGHT foot 2/18          Diabetes Type 2 uncontrolled with Diabetic foot -2/10 Hemoglobin A1c= 11.2 -- patient only on metformin 1000 milligrams BID (home med).  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 Dahlia Byes.  -Patient is asymptomatic.  -- Patient denies history of alcoholism. He drinks very seldom. I did discuss his CT scan results and discussed about cirrhosis with portal hypertension which could be due to his  uncontrolled diabetes and NASH   DVT prophylaxis: Heparin Code Status: Full Family Communication:  Status is: Inpatient    Dispo: The patient is from: Home              Anticipated d/c is to:  Home vs SNF              Anticipated d/c date is: Per surgery              Patient currently unstable      Consultants:  Podiatry Vascular surgery   Procedures/Significant Events:    I have personally reviewed and interpreted all radiology studies and my findings are as above.  VENTILATOR SETTINGS: Room air 2/17 SPO2 97%   Cultures   Antimicrobials: Anti-infectives (From admission, onward)   Start     Ordered Stop   10/03/20 1045  ceFAZolin (ANCEF) IVPB 1 g/50 mL premix  Status:  Discontinued       Note to Pharmacy: To be given in specials   10/03/20 0956 10/03/20 1001   10/03/20 1045  ceFAZolin (ANCEF) IVPB 2g/100 mL premix       Note to Pharmacy: To be given in specials   10/03/20 1001 10/03/20 1245   10/02/20 1800  Ampicillin-Sulbactam (UNASYN) 3 g in sodium chloride 0.9 % 100 mL IVPB        10/02/20 1628     09/28/20 1800  vancomycin (VANCOREADY) IVPB 1500 mg/300 mL  Status:  Discontinued        09/28/20 0915 09/28/20 1553   09/28/20 1800  linezolid (ZYVOX) IVPB 600 mg  Status:  Discontinued        09/28/20 1553 10/02/20 1625   09/28/20 1700  piperacillin-tazobactam (ZOSYN) IVPB 3.375 g  Status:  Discontinued        09/28/20 1553 10/02/20 1624   09/28/20 0600  ceFEPIme (MAXIPIME) 2 g in sodium chloride 0.9 % 100 mL IVPB  Status:  Discontinued        09/28/20 0216 09/28/20 1553   09/28/20 0500  vancomycin (VANCOREADY) IVPB 1750 mg/350 mL  Status:  Discontinued        09/28/20 0222 09/28/20 0915   09/28/20 0200  metroNIDAZOLE (FLAGYL) IVPB 500 mg  Status:  Discontinued        09/27/20 2320 09/28/20 1552   09/27/20 2130  ceFEPIme (MAXIPIME) 2 g in sodium chloride 0.9 % 100 mL IVPB        09/27/20 2120 09/27/20 2310   09/27/20 2130  metroNIDAZOLE (FLAGYL) IVPB 500  mg  Status:  Discontinued        09/27/20 2120 09/28/20 0122   09/27/20 2130  vancomycin (VANCOCIN) IVPB 1000 mg/200 mL premix        09/27/20 2120 09/28/20 0106       Devices    LINES / TUBES:      Continuous Infusions: . sodium chloride    . ampicillin-sulbactam (UNASYN) IV 3 g (10/05/20 0504)     Objective: Vitals:   10/04/20 2024 10/05/20 0020 10/05/20 0423 10/05/20 0758  BP:  133/74 131/71 118/69 137/84  Pulse: 68 69 65 64  Resp: 16 16 16 15   Temp: 98.1 F (36.7 C) (!) 97.4 F (36.3 C) 97.6 F (36.4 C) 97.6 F (36.4 C)  TempSrc: Oral Oral Oral Oral  SpO2: 97% 97% 97% 97%  Weight:      Height:        Intake/Output Summary (Last 24 hours) at 10/05/2020 0840 Last data filed at 10/05/2020 2094 Gross per 24 hour  Intake 840 ml  Output 4125 ml  Net -3285 ml   Filed Weights   09/27/20 1859 09/29/20 1000  Weight: 104.3 kg 104.3 kg    Examination:  General: A/O x4, No acute respiratory distress Eyes: negative scleral hemorrhage, negative anisocoria, negative icterus ENT: Negative Runny nose, negative gingival bleeding, Neck:  Negative scars, masses, torticollis, lymphadenopathy, JVD Lungs: Clear to auscultation bilaterally without wheezes or crackles Cardiovascular: Regular rate and rhythm without murmur gallop or rub normal S1 and S2 Abdomen: negative abdominal pain, nondistended, positive soft, bowel sounds, no rebound, no ascites, no appreciable mass Extremities: see photos above of RIGHT foot diabetic ulcer Skin: see photos above of RIGHT foot diabetic ulcer Psychiatric:  Negative depression, negative anxiety, negative fatigue, negative mania  Central nervous system:  Cranial nerves II through XII intact, tongue/uvula midline, all extremities muscle strength 5/5, sensation intact throughout, negative dysarthria, negative expressive aphasia, negative receptive aphasia.  .     Data Reviewed: Care during the described time interval was provided by me .   I have reviewed this patient's available data, including medical history, events of note, physical examination, and all test results as part of my evaluation.  CBC: Recent Labs  Lab 09/30/20 1043 10/01/20 0610 10/04/20 0950 10/05/20 0513  WBC 14.3* 13.3* 11.2* 9.2  NEUTROABS  --   --  7.6 5.9  HGB 12.8* 12.8* 12.6* 12.3*  HCT 36.3* 37.2* 37.8* 36.8*  MCV 95.3 94.4 97.7 96.3  PLT 197 232 296 709   Basic Metabolic Panel: Recent Labs  Lab 09/30/20 1043 10/03/20 0651 10/04/20 0950 10/05/20 0513  NA 130* 134* 134* 134*  K 3.9 4.1 4.5 4.1  CL 93* 97* 97* 97*  CO2 27 28 26 28   GLUCOSE 257* 230* 197* 241*  BUN 16 11 12 13   CREATININE 1.13 0.98 0.94 0.83  CALCIUM 8.2* 8.4* 8.4* 8.6*  MG  --   --  1.9 1.8  PHOS  --   --  3.5 3.9   GFR: Estimated Creatinine Clearance: 137 mL/min (by C-G formula based on SCr of 0.83 mg/dL). Liver Function Tests: Recent Labs  Lab 09/30/20 1043 10/04/20 0950 10/05/20 0513  AST 54* 44* 35  ALT 46* 34 28  ALKPHOS 199* 300* 263*  BILITOT 2.1* 0.7 0.7  PROT 6.6 7.7 7.2  ALBUMIN 1.9* 2.1* 2.0*   No results for input(s): LIPASE, AMYLASE in the last 168 hours. No results for input(s): AMMONIA in the last 168 hours. Coagulation Profile: Recent Labs  Lab 09/30/20 1051  INR 1.2   Cardiac Enzymes: No results for input(s): CKTOTAL, CKMB, CKMBINDEX, TROPONINI in the last 168 hours. BNP (last 3 results) No results for input(s): PROBNP in the last 8760 hours. HbA1C: No results for input(s): HGBA1C in the last 72 hours. CBG: Recent Labs  Lab 10/04/20 0804 10/04/20 1212 10/04/20 1541 10/04/20 2024 10/05/20 0737  GLUCAP 162* 156* 105* 201* 219*   Lipid Profile: Recent Labs    10/05/20 0513  CHOL 140  HDL 23*  LDLCALC  67  TRIG 249*  CHOLHDL 6.1   Thyroid Function Tests: No results for input(s): TSH, T4TOTAL, FREET4, T3FREE, THYROIDAB in the last 72 hours. Anemia Panel: No results for input(s): VITAMINB12, FOLATE, FERRITIN, TIBC,  IRON, RETICCTPCT in the last 72 hours. Sepsis Labs: No results for input(s): PROCALCITON, LATICACIDVEN in the last 168 hours.  Recent Results (from the past 240 hour(s))  Culture, blood (routine x 2)     Status: None   Collection Time: 09/27/20  9:48 PM   Specimen: BLOOD  Result Value Ref Range Status   Specimen Description BLOOD RIGHT ANTECUBITAL  Final   Special Requests   Final    BOTTLES DRAWN AEROBIC AND ANAEROBIC Blood Culture adequate volume   Culture   Final    NO GROWTH 5 DAYS Performed at [facility], [address] [place], [place] [address]    Report Status 10/02/2020 FINAL  Final  Resp Panel by RT-PCR (Flu A&B, Covid) Nasopharyngeal Swab     Status: None   Collection Time: 09/27/20  9:48 PM   Specimen: Nasopharyngeal Swab; Nasopharyngeal(NP) swabs in vial transport medium  Result Value Ref Range Status   SARS Coronavirus 2 by RT PCR NEGATIVE NEGATIVE Final    Comment: (NOTE) SARS-CoV-2 target nucleic acids are NOT DETECTED.  The SARS-CoV-2 RNA is generally detectable in upper respiratory specimens during the acute phase of infection. The lowest concentration of SARS-CoV-2 viral copies this assay can detect is 138 copies/mL. A negative result does not preclude SARS-Cov-2 infection and should not be used as the sole basis for treatment or other patient management decisions. A negative result may occur with  improper specimen collection/handling, submission of specimen other than nasopharyngeal swab, presence of viral mutation(s) within the areas targeted by this assay, and inadequate number of viral copies(<138 copies/mL). A negative result must be combined with clinical observations, patient history, and epidemiological information. The expected result is Negative.  Fact Sheet for Patients:  EntrepreneurPulse.com.au  Fact Sheet for Healthcare Providers:  IncredibleEmployment.be  This test is no t yet approved or  cleared by the [place] FDA and  has been authorized for detection and/or diagnosis of SARS-CoV-2 by FDA under an Emergency Use Authorization (EUA). This EUA will remain  in effect (meaning this test can be used) for the duration of the COVID-19 declaration under Section 564(b)(1) of the Act, 21 U.S.C.section 360bbb-3(b)(1), unless the authorization is terminated  or revoked sooner.       Influenza A by PCR NEGATIVE NEGATIVE Final   Influenza B by PCR NEGATIVE NEGATIVE Final    Comment: (NOTE) The Xpert Xpress SARS-CoV-2/FLU/RSV plus assay is intended as an aid in the diagnosis of influenza from Nasopharyngeal swab specimens and should not be used as a sole basis for treatment. Nasal washings and aspirates are unacceptable for Xpert Xpress SARS-CoV-2/FLU/RSV testing.  Fact Sheet for Patients: EntrepreneurPulse.com.au  Fact Sheet for Healthcare Providers: IncredibleEmployment.be  This test is not yet approved or cleared by the [place] FDA and has been authorized for detection and/or diagnosis of SARS-CoV-2 by FDA under an Emergency Use Authorization (EUA). This EUA will remain in effect (meaning this test can be used) for the duration of the COVID-19 declaration under Section 564(b)(1) of the Act, 21 U.S.C. section 360bbb-3(b)(1), unless the authorization is terminated or revoked.  Performed at [facility], [address] [place],  [address]   Culture, blood (routine x 2)     Status: None   Collection Time: 09/27/20 10:31  PM   Specimen: BLOOD  Result Value Ref Range Status   Specimen Description BLOOD LEFT ANTECUBITAL  Final   Special Requests   Final    BOTTLES DRAWN AEROBIC AND ANAEROBIC Blood Culture adequate volume   Culture   Final    NO GROWTH 5 DAYS Performed at [facility], [address], [place], [place] [address]    Report Status 10/02/2020 FINAL  Final  Aerobic/Anaerobic Culture  (surgical/deep wound)     Status: None   Collection Time: 09/29/20 10:49 AM   Specimen: Wound  Result Value Ref Range Status   Specimen Description   Final    WOUND Performed at [facility], [address] [place], [place] [address]    Special Requests   Final    right foot culture Performed at [facility], [place], [place] [address]    Gram Stain   Final    RARE WBC PRESENT,BOTH PMN AND MONONUCLEAR RARE GRAM POSITIVE COCCI IN PAIRS    Culture   Final    RARE GROUP B STREP(S.AGALACTIAE)ISOLATED TESTING AGAINST S. AGALACTIAE NOT ROUTINELY PERFORMED DUE TO PREDICTABILITY OF AMP/PEN/VAN SUSCEPTIBILITY. NO ANAEROBES ISOLATED Performed at [facility], [place] [address], [place], [place] [address]    Report Status 10/04/2020 FINAL  Final         Radiology Studies: PERIPHERAL VASCULAR CATHETERIZATION  Result Date: 10/03/2020 See Op Note       Scheduled Meds: . aspirin EC  81 mg Oral Daily  . atorvastatin  40 mg Oral Daily  . carvedilol  3.125 mg Oral BID WC  . gabapentin  600 mg Oral QHS  . heparin  5,000 Units Subcutaneous Q8H  . insulin aspart  0-15 Units Subcutaneous TID WC  . insulin aspart  0-5 Units Subcutaneous QHS  . insulin aspart  8 Units Subcutaneous TID WC  . insulin glargine  30 Units Subcutaneous Daily  . lisinopril  5 mg Oral Daily  . melatonin  5 mg Oral QHS  . sodium chloride flush  3 mL Intravenous Q12H  . sodium chloride flush  3 mL Intravenous Q12H   Continuous Infusions: . sodium chloride    . ampicillin-sulbactam (UNASYN) IV 3 g (10/05/20 0504)     LOS: 7 days    Time spent:40 min    Sanjay Broadfoot, Geraldo Docker, MD [facility]   If 7PM-7AM, please contact night-coverage 10/05/2020, 8:40 AM

## 2020-10-05 NOTE — Progress Notes (Signed)
F/u right foot. Dressing removed. Mild/moderate erythema as expected. Large wound laterally. Plan is for debridment possible TMA tomorrow. D/w pt this will be a very difficult wound and may take more than one surgery for closure for limb salvage.

## 2020-10-05 NOTE — Progress Notes (Signed)
   Date of Admission:  09/27/2020     ID: [name] is a 50 y.o. male  Principal Problem:   Cellulitis in diabetic foot ([place]) Active Problems:   Coronary artery disease involving native coronary artery of native heart without angina pectoris   Uncontrolled type 2 diabetes mellitus with circulatory disorder, without long-term current use of insulin ([facility])   Essential hypertension   Hyperlipidemia   Transaminitis    Subjective: Patient is quiet Says he has a headache States his friend visited him   Medications:  . aspirin EC  81 mg Oral Daily  . atorvastatin  40 mg Oral Daily  . carvedilol  3.125 mg Oral BID WC  . gabapentin  600 mg Oral QHS  . heparin  5,000 Units Subcutaneous Q8H  . insulin aspart  0-15 Units Subcutaneous TID WC  . insulin aspart  0-5 Units Subcutaneous QHS  . insulin aspart  8 Units Subcutaneous TID WC  . insulin glargine  30 Units Subcutaneous Daily  . lisinopril  5 mg Oral Daily  . melatonin  5 mg Oral QHS  . sodium chloride flush  3 mL Intravenous Q12H  . sodium chloride flush  3 mL Intravenous Q12H    Objective: Vital signs in last 24 hours: Temp:  [97.4 F (36.3 C)-98.1 F (36.7 C)] 98 F (36.7 C) (02/17 1241) Pulse Rate:  [64-69] 67 (02/17 1241) Resp:  [15-18] 18 (02/17 1241) BP: (118-137)/(69-84) 136/79 (02/17 1241) SpO2:  [97 %] 97 % (02/17 1241)  PHYSICAL EXAM:  General: Alert, cooperative, no distress, Extremities:        Lab Results Recent Labs    10/04/20 0950 10/05/20 0513  WBC 11.2* 9.2  HGB 12.6* 12.3*  HCT 37.8* 36.8*  NA 134* 134*  K 4.5 4.1  CL 97* 97*  CO2 26 28  BUN 12 13  CREATININE 0.94 0.83   Liver Panel Recent Labs    10/04/20 0950 10/05/20 0513  PROT 7.7 7.2  ALBUMIN 2.1* 2.0*  AST 44* 35  ALT 34 28  ALKPHOS 300* 263*  BILITOT 0.7 0.7  Microbiology: Wound culture group B streptococcus Studies/Results: No results found.   Assessment/Plan: Necrotizing infection of the right  foot. Status post I&D and then 5th toe amputation. He is going for TMA tomorrow.  Group B streptococcus in the cultures On Unasyn  Diabetes mellitus poorly controlled on admission.  Management as per primary team  Discussed the management with the patient.

## 2020-10-06 ENCOUNTER — Encounter: Payer: Self-pay | Admitting: Certified Registered Nurse Anesthetist

## 2020-10-06 ENCOUNTER — Encounter
Admission: EM | Disposition: A | Discharge status: Home Health | Payer: Self-pay | Source: Home / Self Care | Attending: Internal Medicine

## 2020-10-06 DIAGNOSIS — Z794 Long term (current) use of insulin: Secondary | ICD-10-CM

## 2020-10-06 DIAGNOSIS — L97513 Non-pressure chronic ulcer of other part of right foot with necrosis of muscle: Secondary | ICD-10-CM

## 2020-10-06 LAB — CBC WITH DIFFERENTIAL/PLATELET
Abs Immature Granulocytes: 0.28 10*3/uL — ABNORMAL HIGH (ref 0.00–0.07)
Basophils Absolute: 0.1 10*3/uL (ref 0.0–0.1)
Basophils Relative: 0 %
Eosinophils Absolute: 0.2 10*3/uL (ref 0.0–0.5)
Eosinophils Relative: 2 %
HCT: 37.1 % — ABNORMAL LOW (ref 39.0–52.0)
Hemoglobin: 12.6 g/dL — ABNORMAL LOW (ref 13.0–17.0)
Immature Granulocytes: 3 %
Lymphocytes Relative: 26 %
Lymphs Abs: 2.9 10*3/uL (ref 0.7–4.0)
MCH: 32.7 pg (ref 26.0–34.0)
MCHC: 34 g/dL (ref 30.0–36.0)
MCV: 96.4 fL (ref 80.0–100.0)
Monocytes Absolute: 0.6 10*3/uL (ref 0.1–1.0)
Monocytes Relative: 5 %
Neutro Abs: 7.3 10*3/uL (ref 1.7–7.7)
Neutrophils Relative %: 64 %
Platelets: 315 10*3/uL (ref 150–400)
RBC: 3.85 MIL/uL — ABNORMAL LOW (ref 4.22–5.81)
RDW: 12.3 % (ref 11.5–15.5)
WBC: 11.3 10*3/uL — ABNORMAL HIGH (ref 4.0–10.5)
nRBC: 0 % (ref 0.0–0.2)

## 2020-10-06 LAB — GLUCOSE, CAPILLARY
Glucose-Capillary: 204 mg/dL — ABNORMAL HIGH (ref 70–99)
Glucose-Capillary: 167 mg/dL — ABNORMAL HIGH (ref 70–99)
Glucose-Capillary: 180 mg/dL — ABNORMAL HIGH (ref 70–99)
Glucose-Capillary: 94 mg/dL (ref 70–99)

## 2020-10-06 LAB — COMPREHENSIVE METABOLIC PANEL
ALT: 26 U/L (ref 0–44)
AST: 32 U/L (ref 15–41)
Albumin: 2.1 g/dL — ABNORMAL LOW (ref 3.5–5.0)
Alkaline Phosphatase: 259 U/L — ABNORMAL HIGH (ref 38–126)
Anion gap: 9 (ref 5–15)
BUN: 13 mg/dL (ref 6–20)
CO2: 26 mmol/L (ref 22–32)
Calcium: 8.7 mg/dL — ABNORMAL LOW (ref 8.9–10.3)
Chloride: 99 mmol/L (ref 98–111)
Creatinine, Ser: 0.91 mg/dL (ref 0.61–1.24)
GFR, Estimated: >60 mL/min (ref >60)
Glucose, Bld: 212 mg/dL — ABNORMAL HIGH (ref 70–99)
Potassium: 3.8 mmol/L (ref 3.5–5.1)
Sodium: 134 mmol/L — ABNORMAL LOW (ref 135–145)
Total Bilirubin: 0.6 mg/dL (ref 0.3–1.2)
Total Protein: 7.4 g/dL (ref 6.5–8.1)

## 2020-10-06 LAB — SURGICAL PATHOLOGY

## 2020-10-06 LAB — MAGNESIUM: Magnesium: 1.8 mg/dL (ref 1.7–2.4)

## 2020-10-06 LAB — PHOSPHORUS: Phosphorus: 3.8 mg/dL (ref 2.5–4.6)

## 2020-10-06 SURGERY — AMPUTATION, FOOT, RAY
Anesthesia: Choice

## 2020-10-06 SURGICAL SUPPLY — 66 items
BLADE MED AGGRESSIVE (BLADE) ×3 IMPLANT
BLADE OSC/SAGITTAL MD 5.5X18 (BLADE) IMPLANT
BLADE SURG 15 STRL LF DISP TIS (BLADE) IMPLANT
BLADE SURG 15 STRL SS (BLADE)
BLADE SURG MINI STRL (BLADE) IMPLANT
BNDG CMPR 75X41 PLY HI ABS (GAUZE/BANDAGES/DRESSINGS) ×1
BNDG CMPR STD VLCR NS LF 5.8X4 (GAUZE/BANDAGES/DRESSINGS) ×2
BNDG CONFORM 2 STRL LF (GAUZE/BANDAGES/DRESSINGS) IMPLANT
BNDG ELASTIC 4X5.8 VLCR NS LF (GAUZE/BANDAGES/DRESSINGS) ×6 IMPLANT
BNDG ELASTIC 4X5.8 VLCR STR LF (GAUZE/BANDAGES/DRESSINGS) ×3 IMPLANT
BNDG ESMARK 4X12 TAN STRL LF (GAUZE/BANDAGES/DRESSINGS) ×3 IMPLANT
BNDG GAUZE 4.5X4.1 6PLY STRL (MISCELLANEOUS) ×3 IMPLANT
BNDG STRETCH 4X75 STRL LF (GAUZE/BANDAGES/DRESSINGS) ×3 IMPLANT
CANISTER SUCT 1200ML W/VALVE (MISCELLANEOUS) ×3 IMPLANT
CNTNR SPEC 2.5X3XGRAD LEK (MISCELLANEOUS) ×1
CONT SPEC 4OZ STER OR WHT (MISCELLANEOUS) ×1
CONT SPEC 4OZ STRL OR WHT (MISCELLANEOUS) ×1
CONTAINER SPEC 2.5X3XGRAD LEK (MISCELLANEOUS) ×2 IMPLANT
COVER LIGHT HANDLE STERIS (MISCELLANEOUS) ×6 IMPLANT
COVER WAND RF STERILE (DRAPES) ×3 IMPLANT
CUFF TOURN DUAL PL 12 NO SLV (MISCELLANEOUS) IMPLANT
CUFF TOURN SGL QUICK 18X4 (TOURNIQUET CUFF) IMPLANT
CUFF TOURN SGL QUICK 30 (TOURNIQUET CUFF)
CUFF TRNQT CYL 30X4X21-28X (TOURNIQUET CUFF) IMPLANT
DRAPE FLUOR MINI C-ARM 54X84 (DRAPES) ×3 IMPLANT
DURAPREP 26ML APPLICATOR (WOUND CARE) ×3 IMPLANT
ELECT REM PT RETURN 9FT ADLT (ELECTROSURGICAL) ×2
ELECTRODE REM PT RTRN 9FT ADLT (ELECTROSURGICAL) ×2 IMPLANT
GAUZE SPONGE 4X4 12PLY STRL (GAUZE/BANDAGES/DRESSINGS) ×6 IMPLANT
GAUZE XEROFORM 1X8 LF (GAUZE/BANDAGES/DRESSINGS) ×3 IMPLANT
GLOVE SURG ENC MOIS LTX SZ7 (GLOVE) ×3 IMPLANT
GLOVE SURG UNDER LTX SZ7 (GLOVE) ×3 IMPLANT
GLOVE SURG UNDER POLY LF SZ7 (GLOVE) ×3 IMPLANT
GOWN STRL REUS W/ TWL LRG LVL3 (GOWN DISPOSABLE) ×4 IMPLANT
GOWN STRL REUS W/TWL LRG LVL3 (GOWN DISPOSABLE) ×4
HANDPIECE VERSAJET DEBRIDEMENT (MISCELLANEOUS) IMPLANT
K-WIRE DBL END TROCAR 6X.045 (WIRE) ×2
KIT TURNOVER KIT A (KITS) ×3 IMPLANT
KWIRE DBL END TROCAR 6X.045 (WIRE) ×2 IMPLANT
LABEL OR SOLS (LABEL) ×3 IMPLANT
MANIFOLD NEPTUNE II (INSTRUMENTS) ×3 IMPLANT
NDL FILTER BLUNT 18X1 1/2 (NEEDLE) ×1 IMPLANT
NDL HYPO 25X1 1.5 SAFETY (NEEDLE) ×2 IMPLANT
NEEDLE FILTER BLUNT 18X 1/2SAF (NEEDLE) ×1
NEEDLE FILTER BLUNT 18X1 1/2 (NEEDLE) ×1 IMPLANT
NEEDLE HYPO 25X1 1.5 SAFETY (NEEDLE) ×4 IMPLANT
NS IRRIG 500ML POUR BTL (IV SOLUTION) ×3 IMPLANT
PACK EXTREMITY ARMC (MISCELLANEOUS) ×3 IMPLANT
PADDING CAST BLEND 4X4 NS (MISCELLANEOUS) ×9 IMPLANT
PENCIL SMOKE EVACUATOR (MISCELLANEOUS) ×3 IMPLANT
SOL .9 NS 3000ML IRR  AL (IV SOLUTION)
SOL .9 NS 3000ML IRR AL (IV SOLUTION)
SOL .9 NS 3000ML IRR UROMATIC (IV SOLUTION) IMPLANT
SOL PREP PVP 2OZ (MISCELLANEOUS) ×2
SOLUTION PREP PVP 2OZ (MISCELLANEOUS) ×2 IMPLANT
SPLINT CAST 1 STEP 4X30 (MISCELLANEOUS) ×3 IMPLANT
STOCKINETTE IMPERVIOUS 9X36 MD (GAUZE/BANDAGES/DRESSINGS) ×3 IMPLANT
STOCKINETTE STRL 6IN 960660 (GAUZE/BANDAGES/DRESSINGS) ×3 IMPLANT
SUT ETHILON 3-0 FS-10 30 BLK (SUTURE) ×4
SUT ETHILON NAB PS2 4-0 18IN (SUTURE) ×3 IMPLANT
SUT VIC AB 3-0 SH 27 (SUTURE) ×2
SUT VIC AB 3-0 SH 27X BRD (SUTURE) ×2 IMPLANT
SUT VIC AB 4-0 FS2 27 (SUTURE) IMPLANT
SUTURE EHLN 3-0 FS-10 30 BLK (SUTURE) ×4 IMPLANT
SWAB CULTURE AMIES ANAERIB BLU (MISCELLANEOUS) IMPLANT
SYR 10ML LL (SYRINGE) ×3 IMPLANT

## 2020-10-06 NOTE — Progress Notes (Signed)
PROGRESS NOTE    [name]  QJJ:[id number] DOB: [birth year] DOA: 09/27/2020 PCP: Coral Spikes, DO     Brief Narrative:  50 y.o. WM PMHx CAD, HTN, HLD, DM type II uncontrolled with complication (RIGHT DM foot ulcer)  Presents with worsening right foot wound.             Patient states that 5 or 6 days ago he fell on getting out of his truck and cut his foot on a piece of metal (alluminum, not rusted) after slipping on some ice.  He states his foot was normal prior to this.  He has been staying at his home since the incident.  Following this fall and cut he did note some swelling of the foot but it was not until 2 days ago that his wound became worse and began to become red hot and developed ulcerations weeping some fluid. He reports fever and chills at home.  He states he took some Tylenol for his fever at home which helped.  Does not member when his last tetanus vaccine was.  Pain currently about 3 out of 10.  Denies chest pain, shortness of breath, abdominal pain, constipation, diarrhea, nausea, vomiting.  ED Course: Vital signs in the ED significant for blood pressure in the 481E to 563J systolic, heart rate initially in the 100s improved with 90s.  Saturating well on room air.  Lab work-up showed BMP with sodium of 132 which corrects to normal considering glucose of nearly 400., chloride 96, glucose 397.  LFTs with AST 68, ALT 64, alk phos 263, T bili 2.4.  CBC with leukocytosis to 18.9.  Lactic acid normal at 1.4.  Respiratory panel for flu and Covid if pending.  Urinalysis and blood cultures pending.  Patient given a liter of fluids and started on Vanco, Flagyl, cefepime in the ED.    Subjective: 2/18 afebrile overnight A/O x4, laying in bed ready to have his surgery today.   Assessment & Plan: Covid vaccination; unvaccinated   Principal Problem:   Cellulitis in diabetic foot ([place]) Active Problems:   Coronary artery disease involving native coronary artery of native heart  without angina pectoris   Uncontrolled type 2 diabetes mellitus with circulatory disorder, without long-term current use of insulin ([place])   Essential hypertension   Hyperlipidemia   Transaminitis   Sepsis POA due to Cellulitis/ RIGHT Diabetic foot injury --sepsis resolved. -- Patient came in with tachycardia heart rate above 90 with elevated white count and significant right foot infection. -tdap given - podiatry consult with Dr. Vickki Muff  --2/11--s/p debridement of multiple abscesses on the right foot on 2/11 -- 2/13--s/p partial fifth three amputation. Incision drainage multiple location dorsal and plantar right foot. Placement of antibiotic beads. Placement of wound vac --Continue Zosyn and Linezolid--per ID consultation with Dr. Steva Ready  -Follow-up  blood culture negative -WC S Agalactiae -- For angiogram tomorrow  --2/15 s/p lower extremity angiogram shows mild atherosclerotic disease. No significant blockage. -Scheduled for further debridement of RIGHT foot 2/18          Diabetes Type 2 uncontrolled with Diabetic foot -2/10 Hemoglobin A1c= 11.2 -- patient only on metformin 1000 milligrams BID (home med).  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 Dahlia Byes.  -Patient is asymptomatic.  -- Patient denies history of alcoholism. He drinks very seldom. I did discuss his CT scan results and discussed about cirrhosis with portal hypertension which could be due to his  uncontrolled diabetes and NASH -2/18 slowly improving   DVT prophylaxis: Heparin Code Status: Full Family Communication:  Status is: Inpatient    Dispo: The patient is from: Home              Anticipated d/c is to:  Home vs SNF              Anticipated d/c date is: Per surgery              Patient currently unstable      Consultants:  Podiatry Vascular surgery   Procedures/Significant Events:    I have personally reviewed and interpreted all radiology studies and my findings are as above.  VENTILATOR SETTINGS: Room air 2/18 SPO2 97%   Cultures   Antimicrobials: Anti-infectives (From admission, onward)   Start     Ordered Stop   10/03/20 1045  ceFAZolin (ANCEF) IVPB 1 g/50 mL premix  Status:  Discontinued       Note to Pharmacy: To be given in specials   10/03/20 0956 10/03/20 1001   10/03/20 1045  ceFAZolin (ANCEF) IVPB 2g/100 mL premix       Note to Pharmacy: To be given in specials   10/03/20 1001 10/03/20 1245   10/02/20 1800  Ampicillin-Sulbactam (UNASYN) 3 g in sodium chloride 0.9 % 100 mL IVPB        10/02/20 1628     09/28/20 1800  vancomycin (VANCOREADY) IVPB 1500 mg/300 mL  Status:  Discontinued        09/28/20 0915 09/28/20 1553   09/28/20 1800  linezolid (ZYVOX) IVPB 600 mg  Status:  Discontinued        09/28/20 1553 10/02/20 1625   09/28/20 1700  piperacillin-tazobactam (ZOSYN) IVPB 3.375 g  Status:  Discontinued        09/28/20 1553 10/02/20 1624   09/28/20 0600  ceFEPIme (MAXIPIME) 2 g in sodium chloride 0.9 % 100 mL IVPB  Status:  Discontinued        09/28/20 0216 09/28/20 1553   09/28/20 0500  vancomycin (VANCOREADY) IVPB 1750 mg/350 mL  Status:  Discontinued        09/28/20 0222 09/28/20 0915   09/28/20 0200  metroNIDAZOLE (FLAGYL) IVPB 500 mg  Status:  Discontinued        09/27/20 2320 09/28/20 1552   09/27/20 2130  ceFEPIme (MAXIPIME) 2 g in sodium chloride 0.9 % 100 mL IVPB        09/27/20 2120 09/27/20 2310   09/27/20 2130   metroNIDAZOLE (FLAGYL) IVPB 500 mg  Status:  Discontinued        09/27/20 2120 09/28/20 0122   09/27/20 2130  vancomycin (VANCOCIN) IVPB 1000 mg/200 mL premix        09/27/20 2120 09/28/20 0106       Devices    LINES / TUBES:      Continuous Infusions: . sodium chloride    . ampicillin-sulbactam (UNASYN) IV Stopped (10/06/20 0504)  .  ceFAZolin (ANCEF) IV       Objective: Vitals:   10/05/20  1913 10/05/20 2350 10/06/20 0429 10/06/20 0730  BP: 130/84 125/75 122/64 114/69  Pulse: 78 75 67 64  Resp: 18 (!) _0 Temp: 98.8 F (37.1 C) 98.4 F (36.9 C) 98.5 F (36.9 C) 98 F (36.7 C)  TempSrc: Oral Oral Oral   SpO2: 97% 100% 97% 96%  Weight:      Height:        Intake/Output Summary (Last 24 hours) at 10/06/2020 1610 Last data filed at 10/06/2020 0900 Gross per 24 hour  Intake 940 ml  Output 2100 ml  Net -1160 ml   Filed Weights   09/27/20 1859 09/29/20 1000  Weight: 104.3 kg 104.3 kg    Examination:  General: A/O x4, No acute respiratory distress Eyes: negative scleral hemorrhage, negative anisocoria, negative icterus ENT: Negative Runny nose, negative gingival bleeding, Neck:  Negative scars, masses, torticollis, lymphadenopathy, JVD Lungs: Clear to auscultation bilaterally without wheezes or crackles Cardiovascular: Regular rate and rhythm without murmur gallop or rub normal S1 and S2 Abdomen: negative abdominal pain, nondistended, positive soft, bowel sounds, no rebound, no ascites, no appreciable mass Extremities: see photos above of RIGHT foot diabetic ulcer Skin: see photos above of RIGHT foot diabetic ulcer Psychiatric:  Negative depression, negative anxiety, negative fatigue, negative mania  Central nervous system:  Cranial nerves II through XII intact, tongue/uvula midline, all extremities muscle strength 5/5, sensation intact throughout, negative dysarthria, negative expressive aphasia, negative receptive aphasia.  .     Data Reviewed:  Care during the described time interval was provided by me .  I have reviewed this patient's available data, including medical history, events of note, physical examination, and all test results as part of my evaluation.  CBC: Recent Labs  Lab 09/30/20 1043 10/01/20 0610 10/04/20 0950 10/05/20 0513 10/06/20 0529  WBC 14.3* 13.3* 11.2* 9.2 11.3*  NEUTROABS  --   --  7.6 5.9 7.3  HGB 12.8* 12.8* 12.6* 12.3* 12.6*  HCT 36.3* 37.2* 37.8* 36.8* 37.1*  MCV 95.3 94.4 97.7 96.3 96.4  PLT 197 232 296 293 960   Basic Metabolic Panel: Recent Labs  Lab 09/30/20 1043 10/03/20 0651 10/04/20 0950 10/05/20 0513 10/06/20 0529  NA 130* 134* 134* 134* 134*  K 3.9 4.1 4.5 4.1 3.8  CL 93* 97* 97* 97* 99  CO2 _1 GLUCOSE 257* 230* 197* 241* 212*  BUN _2 CREATININE 1.13 0.98 0.94 0.83 0.91  CALCIUM 8.2* 8.4* 8.4* 8.6* 8.7*  MG  --   --  1.9 1.8 1.8  PHOS  --   --  3.5 3.9 3.8   GFR: Estimated Creatinine Clearance: 125 mL/min (by C-G formula based on SCr of 0.91 mg/dL). Liver Function Tests: Recent Labs  Lab 09/30/20 1043 10/04/20 0950 10/05/20 0513 10/06/20 0529  AST 54* 44* 35 32  ALT 46* 34 28 26  ALKPHOS 199* 300* 263* 259*  BILITOT 2.1* 0.7 0.7 0.6  PROT 6.6 7.7 7.2 7.4  ALBUMIN 1.9* 2.1* 2.0* 2.1*   No results for input(s): LIPASE, AMYLASE in the last 168 hours. No results for input(s): AMMONIA in the last 168 hours. Coagulation Profile: Recent Labs  Lab 09/30/20 1051  INR 1.2   Cardiac Enzymes: No results for input(s): CKTOTAL, CKMB, CKMBINDEX, TROPONINI in the last 168 hours. BNP (last 3 results) No results for input(s): PROBNP in the last 8760 hours. HbA1C: No results for input(s): HGBA1C in the last 72 hours. CBG: Recent Labs  Lab 10/05/20 0737 10/05/20 1122 10/05/20 1711 10/05/20 2100 10/06/20 0756  GLUCAP 219* 221* 173* 166* 204*   Lipid Profile: Recent Labs    10/05/20 0513  CHOL 140  HDL 23*  LDLCALC 67  TRIG 249*   CHOLHDL 6.1   Thyroid Function Tests: No results for input(s): TSH, T4TOTAL, FREET4, T3FREE, THYROIDAB in the last 72 hours. Anemia Panel: No results for input(s): VITAMINB12, FOLATE, FERRITIN, TIBC, IRON, RETICCTPCT in the last 72 hours. Sepsis Labs: No results for input(s): PROCALCITON, LATICACIDVEN in the last 168 hours.  Recent Results (from the past 240 hour(s))  Culture, blood (routine x 2)     Status: None   Collection Time: 09/27/20  9:48 PM   Specimen: BLOOD  Result Value Ref Range Status   Specimen Description BLOOD RIGHT ANTECUBITAL  Final   Special Requests   Final    BOTTLES DRAWN AEROBIC AND ANAEROBIC Blood Culture adequate volume   Culture   Final    NO GROWTH 5 DAYS Performed at [facility], [address] [place], [place] [address]    Report Status 10/02/2020 FINAL  Final  Resp Panel by RT-PCR (Flu A&B, Covid) Nasopharyngeal Swab     Status: None   Collection Time: 09/27/20  9:48 PM   Specimen: Nasopharyngeal Swab; Nasopharyngeal(NP) swabs in vial transport medium  Result Value Ref Range Status   SARS Coronavirus 2 by RT PCR NEGATIVE NEGATIVE Final    Comment: (NOTE) SARS-CoV-2 target nucleic acids are NOT DETECTED.  The SARS-CoV-2 RNA is generally detectable in upper respiratory specimens during the acute phase of infection. The lowest concentration of SARS-CoV-2 viral copies this assay can detect is 138 copies/mL. A negative result does not preclude SARS-Cov-2 infection and should not be used as the sole basis for treatment or other patient management decisions. A negative result may occur with  improper specimen collection/handling, submission of specimen other than nasopharyngeal swab, presence of viral mutation(s) within the areas targeted by this assay, and inadequate number of viral copies(<138 copies/mL). A negative result must be combined with clinical observations, patient history, and epidemiological information. The expected result  is Negative.  Fact Sheet for Patients:  EntrepreneurPulse.com.au  Fact Sheet for Healthcare Providers:  IncredibleEmployment.be  This test is no t yet approved or cleared by the [place] FDA and  has been authorized for detection and/or diagnosis of SARS-CoV-2 by FDA under an Emergency Use Authorization (EUA). This EUA will remain  in effect (meaning this test can be used) for the duration of the COVID-19 declaration under Section 564(b)(1) of the Act, 21 U.S.C.section 360bbb-3(b)(1), unless the authorization is terminated  or revoked sooner.       Influenza A by PCR NEGATIVE NEGATIVE Final   Influenza B by PCR NEGATIVE NEGATIVE Final    Comment: (NOTE) The Xpert Xpress SARS-CoV-2/FLU/RSV plus assay is intended as an aid in the diagnosis of influenza from Nasopharyngeal swab specimens and should not be used as a sole basis for treatment. Nasal washings and aspirates are unacceptable for Xpert Xpress SARS-CoV-2/FLU/RSV testing.  Fact Sheet for Patients: EntrepreneurPulse.com.au  Fact Sheet for Healthcare Providers: IncredibleEmployment.be  This test is not yet approved or cleared by the [place] FDA and has been authorized for detection and/or diagnosis of SARS-CoV-2 by FDA under an Emergency Use Authorization (EUA). This EUA will remain in effect (meaning this test can be used) for the duration of the COVID-19 declaration under Section 564(b)(1) of the Act, 21 U.S.C. section 360bbb-3(b)(1), unless the  authorization is terminated or revoked.  Performed at [facility], [address] [place], [place] [address]   Culture, blood (routine x 2)     Status: None   Collection Time: 09/27/20 10:31 PM   Specimen: BLOOD  Result Value Ref Range Status   Specimen Description BLOOD LEFT ANTECUBITAL  Final   Special Requests   Final    BOTTLES DRAWN AEROBIC AND ANAEROBIC Blood Culture  adequate volume   Culture   Final    NO GROWTH 5 DAYS Performed at [facility], [address], [place], [place] [address]    Report Status 10/02/2020 FINAL  Final  Aerobic/Anaerobic Culture (surgical/deep wound)     Status: None   Collection Time: 09/29/20 10:49 AM   Specimen: Wound  Result Value Ref Range Status   Specimen Description   Final    WOUND Performed at [facility], [address] [place], [place] [address]    Special Requests   Final    right foot culture Performed at [facility], [place], [place] [address]    Gram Stain   Final    RARE WBC PRESENT,BOTH PMN AND MONONUCLEAR RARE GRAM POSITIVE COCCI IN PAIRS    Culture   Final    RARE GROUP B STREP(S.AGALACTIAE)ISOLATED TESTING AGAINST S. AGALACTIAE NOT ROUTINELY PERFORMED DUE TO PREDICTABILITY OF AMP/PEN/VAN SUSCEPTIBILITY. NO ANAEROBES ISOLATED Performed at [facility], [place] [address], [place], [place] [address]    Report Status 10/04/2020 FINAL  Final         Radiology Studies: No results found.      Scheduled Meds: . aspirin EC  81 mg Oral Daily  . atorvastatin  40 mg Oral Daily  . carvedilol  3.125 mg Oral BID WC  . chlorhexidine  60 mL Topical Once  . chlorhexidine  60 mL Topical Once  . chlorhexidine  60 mL Topical Once  . gabapentin  600 mg Oral QHS  . heparin  5,000 Units Subcutaneous Q8H  . insulin aspart  0-15 Units Subcutaneous TID WC  . insulin aspart  0-5 Units Subcutaneous QHS  . insulin aspart  8 Units Subcutaneous TID WC  . insulin glargine  30 Units Subcutaneous Daily  . lisinopril  5 mg Oral Daily  . melatonin  5 mg Oral QHS  . povidone-iodine  2 application Topical Once  . povidone-iodine  2 application Topical Once  . sodium chloride flush  3 mL Intravenous Q12H  . sodium chloride flush  3 mL Intravenous Q12H   Continuous Infusions: . sodium chloride    . ampicillin-sulbactam (UNASYN) IV Stopped (10/06/20 0504)  .   ceFAZolin (ANCEF) IV       LOS: 8 days    Time spent:40 min    WOODS, Geraldo Docker, MD [facility]   If 7PM-7AM, please contact night-coverage 10/06/2020, 9:51 AM

## 2020-10-06 NOTE — TOC Progression Note (Addendum)
Transition of Care [facility]) - Progression Note    Patient Details  Name: [name] MRN: [id number] Date of Birth: [birth year]  Transition of Care [facility]) CM/SW Contact  Liliana Cline, LCSW Phone Number: 10/06/2020, 8:37 AM  Clinical Narrative:      Asked MD, Podiatrist, and PA if patient will discharge on wound vac. Patient is uninsured so will need to arrange charity wound vac and HHRN if needed. CSW also informed by Pharmacist that patient might DC on IV medications. TOC will continue to follow for possible needs at time of DC.   Per Dr. Excell Seltzer, discontinuing wound vac for now.   Left handoff for weekend to follow as well.    Expected Discharge Plan: Home/Self Care Barriers to Discharge: Continued Medical Work up  Expected Discharge Plan and Services Expected Discharge Plan: Home/Self Care       Living arrangements for the past 2 months: Single Family Home                                       Social Determinants of Health (SDOH) Interventions    Readmission Risk Interventions No flowsheet data found.

## 2020-10-06 NOTE — Evaluation (Signed)
Physical Therapy Evaluation Patient Details Name: [name] MRN: [id number] DOB: April 19, 1971 Today's Date: 10/06/2020   History of Present Illness  [name] is a 50 y.o. male is status post right partial fifth ray amputation with incision and drainage with application of antibiotic beads and wound VAC.  Patient has remained nonweightbearing since procedure.  He presents with R foot infection; cellulitis in diabetic foot.  Clinical Impression  The pt presents s/p R partial 5th ray amputation. He is now NWB on the RLE unless completing transfers and then he is allowed to transfer with partial WB through the R heel. The pt is able to initiate pre-gait training this session, however is fatigued easily. The pt is motivated to participate with PT and progress towards d/c home. PT will continue to follow. At this time it is expected that the pt will progress to d/c home with intermittent assistance from friends.     Follow Up Recommendations Home health PT;Supervision - Intermittent;Supervision for mobility/OOB    Equipment Recommendations  Rolling walker with 5" wheels;3in1 (PT)    Recommendations for Other Services       Precautions / Restrictions Restrictions Weight Bearing Restrictions: Yes RLE Weight Bearing: Non weight bearing RLE Partial Weight Bearing Percentage or Pounds: Patient can be partial weightbearing with heel contact in a surgical shoe for transfers but otherwise needs to stay off of the right foot.      Mobility  Bed Mobility Overal bed mobility: Modified Independent                  Transfers Overall transfer level: Needs assistance Equipment used: Rolling walker (2 wheeled) Transfers: Sit to/from [employer] Sit to Stand: Min assist   Squat pivot transfers: Min guard     General transfer comment: transfers to bedside chair that has bilateral arm rest. Demonstrates some difficulty with buttock clearance. completed  x2  Ambulation/Gait             General Gait Details: Pre-gait training activity: Standing calf raises on LLE, Hop to gait. Pt fatigued following x2 hops.  Stairs            Wheelchair Mobility    Modified Rankin (Stroke Patients Only)       Balance Overall balance assessment: Needs assistance Sitting-balance support: Feet supported;No upper extremity supported Sitting balance-Leahy Scale: Normal     Standing balance support: During functional activity;Bilateral upper extremity supported Standing balance-Leahy Scale: Fair                               Pertinent Vitals/Pain Pain Assessment: No/denies pain    Home Living Family/patient expects to be discharged to:: Private residence Living Arrangements: Alone Available Help at Discharge: Friend(s) Type of Home: House Home Access: Stairs to enter Entrance Stairs-Rails: None Technical brewer of Steps: 5 Home Layout: One level Home Equipment: Cane - single point      Prior Function Level of Independence: Independent with assistive device(s)               Hand Dominance        Extremity/Trunk Assessment   Upper Extremity Assessment Upper Extremity Assessment: Overall WFL for tasks assessed    Lower Extremity Assessment Lower Extremity Assessment: LLE deficits/detail;RLE deficits/detail RLE Deficits / Details: Not formally assessed d/t NWB status and bandaging RLE: Unable to fully assess due to immobilization RLE Sensation: decreased light touch;history of peripheral neuropathy LLE  Deficits / Details: Limited dorsiflexion and plantarflexion LLE Sensation: history of peripheral neuropathy       Communication   Communication: No difficulties  Cognition Arousal/Alertness: Awake/alert Behavior During Therapy: Flat affect;WFL for tasks assessed/performed Overall Cognitive Status: Within Functional Limits for tasks assessed                                         General Comments      Exercises Other Exercises Other Exercises: standing calf raises x10 with RW, Close supervision   Assessment/Plan    PT Assessment Patient needs continued PT services  PT Problem List Decreased strength;Decreased mobility;Decreased activity tolerance;Decreased balance;Impaired sensation       PT Treatment Interventions Gait training;Patient/family education;Stair training;Functional mobility training;Therapeutic activities    PT Goals (Current goals can be found in the Care Plan section)  Acute Rehab PT Goals Patient Stated Goal: to get home PT Goal Formulation: With patient Time For Goal Achievement: 10/20/20 Potential to Achieve Goals: Fair    Frequency 7X/week   Barriers to discharge Inaccessible home environment pt reporting intermittent assistance with 5 steps to enter home with no handrails.    Co-evaluation               AM-PAC PT "6 Clicks" Mobility  Outcome Measure Help needed turning from your back to your side while in a flat bed without using bedrails?: None Help needed moving from lying on your back to sitting on the side of a flat bed without using bedrails?: None   Help needed standing up from a chair using your arms (e.g., wheelchair or bedside chair)?: A Little Help needed to walk in hospital room?: A Lot Help needed climbing 3-5 steps with a railing? : A Lot 6 Click Score: 15    End of Session Equipment Utilized During Treatment: Gait belt Activity Tolerance: Patient limited by fatigue;No increased pain Patient left: in bed (sitting at EOB)   PT Visit Diagnosis: Unsteadiness on feet (R26.81);Difficulty in walking, not elsewhere classified (R26.2);Muscle weakness (generalized) (M62.81)    Time: 7482-7078 PT Time Calculation (min) (ACUTE ONLY): 48 min   Charges:   PT Evaluation $PT Eval Moderate Complexity: 1 Mod PT Treatments $Gait Training: 38-52 mins      4:21 PM, 10/06/20 Sherrel Shafer A. Saverio Danker PT,  DPT Physical Therapist - [facility]  Danyel Tobey A Tomothy Eddins 10/06/2020, 4:14 PM

## 2020-10-06 NOTE — Progress Notes (Signed)
ID  PT was seen by Dr. Luana Shu. Surgery was canceled because the demarcation line is progressing.   Patient Vitals for the past 24 hrs:  BP Temp Temp src Pulse Resp SpO2  10/06/20 1938 117/74 98.1 F (36.7 C) Oral 73 16 98 %  10/06/20 1608 140/87 98.2 F (36.8 C) Oral 71 16 97 %  10/06/20 1257 (!) 145/85 99.1 F (37.3 C) Oral 68 16 98 %  10/06/20 1100 119/70 98 F (36.7 C) -- 68 18 96 %  10/06/20 0730 114/69 98 F (36.7 C) -- 64 18 96 %  10/06/20 0429 122/64 98.5 F (36.9 C) Oral 67 16 97 %  10/05/20 2350 125/75 98.4 F (36.9 C) Oral 75 (!) 22 100 %  [place] of the bed   Labs CBC Latest Ref Rng & Units 10/06/2020 10/05/2020 10/04/2020  WBC 4.0 - 10.5 K/uL 11.3(H) 9.2 11.2(H)  Hemoglobin 13.0 - 17.0 g/dL 12.6(L) 12.3(L) 12.6(L)  Hematocrit 39.0 - 52.0 % 37.1(L) 36.8(L) 37.8(L)  Platelets 150 - 400 K/uL 315 293 296    CMP Latest Ref Rng & Units 10/06/2020 10/05/2020 10/04/2020  Glucose 70 - 99 mg/dL 212(H) 241(H) 197(H)  BUN 6 - 20 mg/dL 13 13 12   Creatinine 0.61 - 1.24 mg/dL 0.91 0.83 0.94  Sodium 135 - 145 mmol/L 134(L) 134(L) 134(L)  Potassium 3.5 - 5.1 mmol/L 3.8 4.1 4.5  Chloride 98 - 111 mmol/L 99 97(L) 97(L)  CO2 22 - 32 mmol/L 26 28 26   Calcium 8.9 - 10.3 mg/dL 8.7(L) 8.6(L) 8.4(L)  Total Protein 6.5 - 8.1 g/dL 7.4 7.2 7.7  Total Bilirubin 0.3 - 1.2 mg/dL 0.6 0.7 0.7  Alkaline Phos 38 - 126 U/L 259(H) 263(H) 300(H)  AST 15 - 41 U/L 32 35 44(H)  ALT 0 - 44 U/L 26 28 34   Micro Wound culture GBS  Impression/recommendation Diabetic foot infection with severe necrotizing infection of the right foot Underwent IND and then fifth ray amputation.  Patient bone is exposed and there is some dusky changes present.  The areas of necrosis still evolving and need a clear demarcation line before he can be taken back for surgery as per Dr. Luana Shu   The question is whether he would need transmetatarsal amputation versus BKA. Patient is currently on Unasyn for group B strep and  anaerobes. He does not have insurance.  He will need IV antibiotics for a minimum of 2 weeks but ideally up to 6 weeks.  Diabetes mellitus poorly controlled on admission currently on insulin   ID will follow him peripherally this weekend.  Call if needed.

## 2020-10-06 NOTE — Evaluation (Signed)
Occupational Therapy Evaluation Patient Details Name: [name] MRN: [id number] DOB: November 16, 1970 Today's Date: 10/06/2020    History of Present Illness [name] is a 50 y.o. male is status post right partial fifth ray amputation with incision and drainage with application of antibiotic beads and wound VAC.  Patient has remained nonweightbearing since procedure.  He presents with R foot infection; cellulitis in diabetic foot.   Clinical Impression   Patient presenting with decreased I in self care, balance, functional mobility/transfer, endurance, and safety awareness. 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 Pt normally stays in a hotel but has been staying with a friend since incident with R foot. Patient currently functioning at min A overall but is limited this session secondary to pain.Next session focus on functional transfers while maintaining weight bearing restrictions. Patient will benefit from acute OT to increase overall independence in the areas of ADLs, functional mobility, and safety awareness in order to safely discharge home with friend.    Follow Up Recommendations  Home health OT;Supervision - Intermittent ( for mobility/transfers)   Equipment Recommendations  Other (comment) (drop arm commode chair and RW)       Precautions / Restrictions Precautions Precautions: Fall Restrictions Weight Bearing Restrictions: Yes RLE Weight Bearing: Non weight bearing RLE Partial Weight Bearing Percentage or Pounds: pt can be partial weight bearing with heel contact in surgical shoe for transfers but otherwise NWB per MD order      Mobility Bed Mobility Overal bed mobility: Modified Independent                  Transfers Overall transfer level: Needs assistance Equipment used: Rolling walker (2 wheeled) Transfers: Sit to/from [employer] Sit to Stand: Min assist   Squat pivot transfers: Min guard     General  transfer comment: Pt declined    Balance Overall balance assessment: Needs assistance Sitting-balance support: Feet supported;No upper extremity supported Sitting balance-Leahy Scale: Normal     Standing balance support: During functional activity;Bilateral upper extremity supported Standing balance-Leahy Scale: Fair                             ADL either performed or assessed with clinical judgement   ADL Overall ADL's : Needs assistance/impaired     Grooming: Wash/dry hands;Wash/dry face;Oral care;Sitting;Set up                                 General ADL Comments: session limited secondary to increased pain. Pt likely needing min A for transfer to commode chair. Pt was able to demonstrate figure four position for LE self care/threading of clothing.     Vision Baseline Vision/History: No visual deficits Patient Visual Report: No change from baseline              Pertinent Vitals/Pain Pain Assessment: 0-10 Pain Score: 7  Pain Location: R foot Pain Descriptors / Indicators: Aching;Sharp Pain Intervention(s): Limited activity within patient's tolerance     Hand Dominance Right   Extremity/Trunk Assessment Upper Extremity Assessment Upper Extremity Assessment: Overall WFL for tasks assessed   Lower Extremity Assessment Lower Extremity Assessment: Defer to PT evaluation RLE Deficits / Details: Not formally assessed d/t NWB status and bandaging RLE: Unable to fully assess due to immobilization RLE Sensation: decreased light touch;history of peripheral neuropathy LLE Deficits / Details: Limited dorsiflexion  and plantarflexion LLE Sensation: history of peripheral neuropathy       Communication Communication Communication: No difficulties   Cognition Arousal/Alertness: Awake/alert Behavior During Therapy: Flat affect;WFL for tasks assessed/performed Overall Cognitive Status: Within Functional Limits for tasks assessed                                         Exercises Other Exercises Other Exercises: standing calf raises x10 with RW, Close supervision        Home Living Family/patient expects to be discharged to:: Private residence Living Arrangements: Non-relatives/Friends Available Help at Discharge: Friend(s);Available PRN/intermittently Type of Home: House Home Access: Stairs to enter [employer] of Steps: 7 Entrance Stairs-Rails: None Home Layout: One level     Bathroom Shower/Tub: Teacher, early years/pre: Standard     Home Equipment: Cane - single point   Additional Comments: 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      Prior Functioning/Environment Level of Independence: Independent with assistive device(s)                 OT Problem List: Decreased strength;Decreased activity tolerance;Decreased knowledge of use of DME or AE;Impaired sensation;Impaired UE functional use;Decreased safety awareness;Impaired balance (sitting and/or standing);Decreased knowledge of precautions;Pain      OT Treatment/Interventions: Self-care/ADL training;Therapeutic exercise;Energy conservation;DME and/or AE instruction;Cognitive remediation/compensation;Therapeutic activities;Balance training;Patient/family education;Manual therapy;Modalities    OT Goals(Current goals can be found in the care plan section) Acute Rehab OT Goals Patient Stated Goal: to get home OT Goal Formulation: With patient Time For Goal Achievement: 10/20/20 Potential to Achieve Goals: Good ADL Goals Pt Will Perform Grooming: with modified independence;sitting Pt Will Transfer to Toilet: with modified independence;bedside commode Pt Will Perform Toileting - Clothing Manipulation and hygiene: with modified independence;sit to/from stand;sitting/lateral leans  OT Frequency: Min 2X/week   Barriers to D/C:    none known at this time           AM-PAC OT "6 Clicks" Daily Activity     Outcome Measure Help from another person eating meals?: None Help from another person taking care of personal grooming?: None Help from another person toileting, which includes using toliet, bedpan, or urinal?: A Lot Help from another person bathing (including washing, rinsing, drying)?: A Lot Help from another person to put on and taking off regular upper body clothing?: None Help from another person to put on and taking off regular lower body clothing?: A Lot 6 Click Score: 18   End of Session Nurse Communication: Mobility status;Patient requests pain meds  Activity Tolerance: Patient limited by pain Patient left: in bed;with call bell/phone within reach;with bed alarm set  OT Visit Diagnosis: Unsteadiness on feet (R26.81);Muscle weakness (generalized) (M62.81);History of falling (Z91.81)                Time: 1525-1540 OT Time Calculation (min): 15 min Charges:  OT General Charges $OT Visit: 1 Visit OT Evaluation $OT Eval Low Complexity: 1 Low  Darleen Crocker, MS, OTR/L , CBIS ascom [phone]  10/06/20, 4:44 PM

## 2020-10-06 NOTE — Progress Notes (Signed)
Inpatient Diabetes Program Recommendations  AACE/ADA: New Consensus Statement on Inpatient Glycemic Control (2015)  Target Ranges:  Prepandial:   less than 140 mg/dL      Peak postprandial:   less than 180 mg/dL (1-2 hours)      Critically ill patients:  140 - 180 mg/dL   Results for [name], [name] (MRN [id number]) as of 10/06/2020 08:43  Ref. Range 10/05/2020 07:37 10/05/2020 11:22 10/05/2020 17:11 10/05/2020 21:00  Glucose-Capillary Latest Ref Range: 70 - 99 mg/dL 219 (H) 221 (H) 173 (H) 166 (H)   Results for [name], [name] (MRN [id number]) as of 10/06/2020 08:43  Ref. Range 10/06/2020 07:56  Glucose-Capillary Latest Ref Range: 70 - 99 mg/dL 204 (H)    Home DM Meds: Metformin 1000 mg BID  Current Orders: Lantus 30 units daily      Novolog 0-15 units ac/hs       Novolog 8 units TID with meals    MD- Note AM CBGs have been >200 the last 2 days  Please consider increasing the Lantus to 33 units Daily (10% increase)    --Will follow patient during hospitalization--  Wyn Quaker RN, MSN, CDE Diabetes Coordinator Inpatient Glycemic Control Team Team Pager: [phone] (8a-5p)

## 2020-10-06 NOTE — Progress Notes (Signed)
PODIATRY / FOOT AND ANKLE SURGERY PROGRESS NOTE  Reason for consult: Right foot infection    HPI: [name] is a 50 y.o. male who presents with who presents status post right partial fifth ray amputation with incision and drainage with application of antibiotic beads and wound VAC.  Patient has remained nonweightbearing since procedure.  Patient resting in bed comfortably.  Patient is still not putting weight on the foot.  PMHx:  Past Medical History:  Diagnosis Date  . Diabetes mellitus without complication ([place])   . Heart disease   . Myocardial infarction [facility])     Surgical Hx:  Past Surgical History:  Procedure Laterality Date  . AMPUTATION Right 10/01/2020   Procedure: AMPUTATION 5th  RAY AND IRRIGATION AND DEBRIDEMENT;  Surgeon: Samara Deist, DPM;  Location: [facility] ORS;  Service: Podiatry;  Laterality: Right;  . BACK SURGERY    . INCISION AND DRAINAGE Right 09/29/2020   Procedure: INCISION AND DRAINAGE, RIGHT FOOT;  Surgeon: Samara Deist, DPM;  Location: [facility] ORS;  Service: Podiatry;  Laterality: Right;  . LOWER EXTREMITY ANGIOGRAPHY Right 10/03/2020   Procedure: Lower Extremity Angiography;  Surgeon: Katha Cabal, MD;  Location: [place] [facility];  Service: Cardiovascular;  Laterality: Right;    FHx:  Family History  Problem Relation Age of Onset  . Diabetes Father   . Heart disease Father   . Breast cancer Mother   . Lung cancer Paternal Grandmother     Social History:  reports that he has been smoking cigarettes. He has been smoking about 1.00 pack per day. He has never used smokeless tobacco. He reports current alcohol use. He reports that he does not use drugs.  Allergies: No Known Allergies  Medications Prior to Admission  Medication Sig Dispense Refill  . atorvastatin (LIPITOR) 40 MG tablet Take 1 tablet (40 mg total) by mouth daily. 90 tablet 3  . Blood Glucose Monitoring Suppl (RELION PRIME MONITOR) DEVI Use up to 4 times daily to check blood  sugars. 1 Device 0  . clopidogrel (PLAVIX) 75 MG tablet TAKE ONE TABLET BY MOUTH ONCE DAILY (Patient not taking: Reported on 09/28/2020) 30 tablet 0  . empagliflozin (JARDIANCE) 10 MG TABS tablet Take 10 mg by mouth daily. (Patient not taking: Reported on 09/28/2020) 90 tablet 0  . gabapentin (NEURONTIN) 300 MG capsule Take 600 mg by mouth at bedtime as needed.    Marland Kitchen glipiZIDE (GLUCOTROL XL) 10 MG 24 hr tablet Take 10 mg by mouth daily.    Marland Kitchen glucose blood (RELION PRIME TEST) test strip Use as instructed 100 each 12  . lisinopril (PRINIVIL,ZESTRIL) 5 MG tablet TAKE ONE TABLET BY MOUTH ONCE DAILY 30 tablet 0  . meloxicam (MOBIC) 7.5 MG tablet Take 7.5 mg by mouth 2 (two) times daily.    . metFORMIN (GLUCOPHAGE) 500 MG tablet TAKE ONE TABLET BY MOUTH TWICE DAILY WITH A MEAL 30 tablet 0  . ReliOn Ultra Thin Lancets MISC Use as directed to take blood sugar. 200 each 0  . sildenafil (VIAGRA) 25 MG tablet Take 1 tablet by mouth daily.    Marland Kitchen sulfamethoxazole-trimethoprim (BACTRIM DS,SEPTRA DS) 800-160 MG tablet Take 1 tablet by mouth 2 (two) times daily. (Patient not taking: Reported on 09/28/2020) 20 tablet 0    Physical Exam: General: Alert and oriented.  No apparent distress.  Vascular: DP/PT pulses faintly palpable bilateral, no hair growth noted to digits bilateral.  Capillary fill time appears to be intact to both feet.  Neuro:  Light touch sensation reduced to bilateral lower extremities.   Derm: Wound beds appear to be stable at this time to the plantar foot and lateral foot right.  Patient appears to have bone exposed at the level of the operative site.  Erythema and edema appear to be greatly improved overall.  Concern as patient appears to have some dusky changes over the dorsal lateral aspect of the right foot with some scattered areas of necrosis.  Patient did have a mild amount of active bleeding present when the dressing was removed at the proximal aspect of the incision line at the fifth  metatarsal base area.  No purulence able to be expressed, no fluctuance.  Plantar incision line appears to be stable at this time but does appear to have macerated margins around the entirety of the incision and wound.      MSK: Right partial fifth ray amputation.  Limited ankle joint dorsiflexion noted with the knee extended but mildly improved with knee flexion bilateral.  Results for orders placed or performed during the hospital encounter of 09/27/20 (from the past 48 hour(s))  Glucose, capillary     Status: Abnormal   Collection Time: 10/04/20  3:41 PM  Result Value Ref Range   Glucose-Capillary 105 (H) 70 - 99 mg/dL    Comment: Glucose reference range applies only to samples taken after fasting for at least 8 hours.  Glucose, capillary     Status: Abnormal   Collection Time: 10/04/20  8:24 PM  Result Value Ref Range   Glucose-Capillary 201 (H) 70 - 99 mg/dL    Comment: Glucose reference range applies only to samples taken after fasting for at least 8 hours.  Lipid panel     Status: Abnormal   Collection Time: 10/05/20  5:13 AM  Result Value Ref Range   Cholesterol 140 0 - 200 mg/dL   Triglycerides 249 (H) <150 mg/dL   HDL 23 (L) >40 mg/dL   Total CHOL/HDL Ratio 6.1 RATIO   VLDL 50 (H) 0 - 40 mg/dL   LDL Cholesterol 67 0 - 99 mg/dL    Comment:        Total Cholesterol/HDL:CHD Risk Coronary Heart Disease Risk Table                     Men   Women  1/2 Average Risk   3.4   3.3  Average Risk       5.0   4.4  2 X Average Risk   9.6   7.1  3 X Average Risk  23.4   11.0        Use the calculated Patient Ratio above and the CHD Risk Table to determine the patient's CHD Risk.        ATP III CLASSIFICATION (LDL):  <100     mg/dL   Optimal  100-129  mg/dL   Near or Above                    Optimal  130-159  mg/dL   Borderline  160-189  mg/dL   High  >190     mg/dL   Very High Performed at [facility], [address] [place],  [address]   Comprehensive  metabolic panel     Status: Abnormal   Collection Time: 10/05/20  5:13 AM  Result Value Ref Range   Sodium 134 (L) 135 - 145 mmol/L   Potassium 4.1 3.5 - 5.1 mmol/L  Chloride 97 (L) 98 - 111 mmol/L   CO2 28 22 - 32 mmol/L   Glucose, Bld 241 (H) 70 - 99 mg/dL    Comment: Glucose reference range applies only to samples taken after fasting for at least 8 hours.   BUN 13 6 - 20 mg/dL   Creatinine, Ser 0.83 0.61 - 1.24 mg/dL   Calcium 8.6 (L) 8.9 - 10.3 mg/dL   Total Protein 7.2 6.5 - 8.1 g/dL   Albumin 2.0 (L) 3.5 - 5.0 g/dL   AST 35 15 - 41 U/L   ALT 28 0 - 44 U/L   Alkaline Phosphatase 263 (H) 38 - 126 U/L   Total Bilirubin 0.7 0.3 - 1.2 mg/dL   GFR, Estimated >60 >60 mL/min    Comment: (NOTE) Calculated using the CKD-EPI Creatinine Equation (2021)    Anion gap 9 5 - 15    Comment: Performed at [facility] [address] [place], [place] [address]  Magnesium     Status: None   Collection Time: 10/05/20  5:13 AM  Result Value Ref Range   Magnesium 1.8 1.7 - 2.4 mg/dL    Comment: Performed at [facility], [address] [place], [place] [address]  Phosphorus     Status: None   Collection Time: 10/05/20  5:13 AM  Result Value Ref Range   Phosphorus 3.9 2.5 - 4.6 mg/dL    Comment: Performed at [facility], [address] [place], [place] [address]  CBC with Differential/Platelet     Status: Abnormal   Collection Time: 10/05/20  5:13 AM  Result Value Ref Range   WBC 9.2 4.0 - 10.5 K/uL   RBC 3.82 (L) 4.22 - 5.81 MIL/uL   Hemoglobin 12.3 (L) 13.0 - 17.0 g/dL   HCT 36.8 (L) 39.0 - 52.0 %   MCV 96.3 80.0 - 100.0 fL   MCH 32.2 26.0 - 34.0 pg   MCHC 33.4 30.0 - 36.0 g/dL   RDW 12.3 11.5 - 15.5 %   Platelets 293 150 - 400 K/uL   nRBC 0.0 0.0 - 0.2 %   Neutrophils Relative % 63 %   Neutro Abs 5.9 1.7 - 7.7 K/uL   Lymphocytes Relative 26 %   Lymphs Abs 2.4 0.7 - 4.0 K/uL   Monocytes Relative 5 %   Monocytes Absolute 0.5 0.1 - 1.0 K/uL    Eosinophils Relative 2 %   Eosinophils Absolute 0.2 0.0 - 0.5 K/uL   Basophils Relative 1 %   Basophils Absolute 0.1 0.0 - 0.1 K/uL   Immature Granulocytes 3 %   Abs Immature Granulocytes 0.23 (H) 0.00 - 0.07 K/uL    Comment: Performed at [facility], [address] [place], [place] [address]  Glucose, capillary     Status: Abnormal   Collection Time: 10/05/20  7:37 AM  Result Value Ref Range   Glucose-Capillary 219 (H) 70 - 99 mg/dL    Comment: Glucose reference range applies only to samples taken after fasting for at least 8 hours.  Glucose, capillary     Status: Abnormal   Collection Time: 10/05/20 11:22 AM  Result Value Ref Range   Glucose-Capillary 221 (H) 70 - 99 mg/dL    Comment: Glucose reference range applies only to samples taken after fasting for at least 8 hours.  Glucose, capillary     Status: Abnormal   Collection Time: 10/05/20  5:11 PM  Result Value Ref Range   Glucose-Capillary 173 (H) 70 -  99 mg/dL    Comment: Glucose reference range applies only to samples taken after fasting for at least 8 hours.  Glucose, capillary     Status: Abnormal   Collection Time: 10/05/20  9:00 PM  Result Value Ref Range   Glucose-Capillary 166 (H) 70 - 99 mg/dL    Comment: Glucose reference range applies only to samples taken after fasting for at least 8 hours.  Comprehensive metabolic panel     Status: Abnormal   Collection Time: 10/06/20  5:29 AM  Result Value Ref Range   Sodium 134 (L) 135 - 145 mmol/L   Potassium 3.8 3.5 - 5.1 mmol/L   Chloride 99 98 - 111 mmol/L   CO2 26 22 - 32 mmol/L   Glucose, Bld 212 (H) 70 - 99 mg/dL    Comment: Glucose reference range applies only to samples taken after fasting for at least 8 hours.   BUN 13 6 - 20 mg/dL   Creatinine, Ser 0.91 0.61 - 1.24 mg/dL   Calcium 8.7 (L) 8.9 - 10.3 mg/dL   Total Protein 7.4 6.5 - 8.1 g/dL   Albumin 2.1 (L) 3.5 - 5.0 g/dL   AST 32 15 - 41 U/L   ALT 26 0 - 44 U/L   Alkaline Phosphatase 259 (H) 38 -  126 U/L   Total Bilirubin 0.6 0.3 - 1.2 mg/dL   GFR, Estimated >60 >60 mL/min    Comment: (NOTE) Calculated using the CKD-EPI Creatinine Equation (2021)    Anion gap 9 5 - 15    Comment: Performed at [facility], [address] [place], [place] [address]  Magnesium     Status: None   Collection Time: 10/06/20  5:29 AM  Result Value Ref Range   Magnesium 1.8 1.7 - 2.4 mg/dL    Comment: Performed at [facility], [address] [place], [place] [address]  Phosphorus     Status: None   Collection Time: 10/06/20  5:29 AM  Result Value Ref Range   Phosphorus 3.8 2.5 - 4.6 mg/dL    Comment: Performed at [facility], [address] [place], [place] [address]  CBC with Differential/Platelet     Status: Abnormal   Collection Time: 10/06/20  5:29 AM  Result Value Ref Range   WBC 11.3 (H) 4.0 - 10.5 K/uL   RBC 3.85 (L) 4.22 - 5.81 MIL/uL   Hemoglobin 12.6 (L) 13.0 - 17.0 g/dL   HCT 37.1 (L) 39.0 - 52.0 %   MCV 96.4 80.0 - 100.0 fL   MCH 32.7 26.0 - 34.0 pg   MCHC 34.0 30.0 - 36.0 g/dL   RDW 12.3 11.5 - 15.5 %   Platelets 315 150 - 400 K/uL   nRBC 0.0 0.0 - 0.2 %   Neutrophils Relative % 64 %   Neutro Abs 7.3 1.7 - 7.7 K/uL   Lymphocytes Relative 26 %   Lymphs Abs 2.9 0.7 - 4.0 K/uL   Monocytes Relative 5 %   Monocytes Absolute 0.6 0.1 - 1.0 K/uL   Eosinophils Relative 2 %   Eosinophils Absolute 0.2 0.0 - 0.5 K/uL   Basophils Relative 0 %   Basophils Absolute 0.1 0.0 - 0.1 K/uL   Immature Granulocytes 3 %   Abs Immature Granulocytes 0.28 (H) 0.00 - 0.07 K/uL    Comment: Performed at [facility], [address] [place], [place] [address]  Glucose, capillary     Status: Abnormal   Collection Time: 10/06/20  7:56 AM  Result Value Ref Range   Glucose-Capillary 204 (H) 70 - 99 mg/dL    Comment: Glucose reference range applies only to samples taken after fasting for at least 8 hours.  Glucose, capillary     Status: Abnormal   Collection  Time: 10/06/20 12:04 PM  Result Value Ref Range   Glucose-Capillary 167 (H) 70 - 99 mg/dL    Comment: Glucose reference range applies only to samples taken after fasting for at least 8 hours.   No results found.  Blood pressure 119/70, pulse 68, temperature 98 F (36.7 C), resp. rate 18, height 6' 2"  (1.88 m), weight 104.3 kg, SpO2 96 %.  Assessment 1. 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 Diabetes type 2 polyneuropathy 3. PVD 4. History of tobacco abuse/smoking 5. Equinus  Plan -Patient seen and examined. -Surgery to be canceled for today due to demarcation needed prior to any further surgical intervention. -Discussed with patient that concern is that the dorsal skin flap may not be viable.  If it is not viable then it will not be possible to perform a proper transmetatarsal amputation.  Would like the tissues to demarcate further prior to any surgical intervention.  Discussed with patient that it likely will be difficult to heal a transmetatarsal amputation at this point.  We can attempt this in the future when tissues demarcate further but if it does not go well and requires local wound care and does not heal he may end up with more proximal amputation such as below-knee amputation.  Patient understands. -Patient can be partial weightbearing with heel contact in a surgical shoe for transfers but otherwise needs to stay off of the right foot.  PT and OT ordered. -Dressing changes ordered.  Recommend Betadine wet-to-dry dressings daily as per orders. -Appreciate vascular recommendations. -Appreciate antibiotic therapy recommendations per medicine.  Previous culture growing group B strep.  Appreciate infectious disease recommendations. -Patient to continue staying off the right foot is much as possible and to use heel for transfers only. -Podiatry team will likely follow-up continually  until discharge.  Believe that patient could potentially be discharged and have outpatient follow-up and set up outpatient procedure as it would likely take 1 to 2 weeks for the tissues to demarcate further.  Appreciate further recommendations for medication management as well as antibiotic therapy for home.  Podiatry team to still follow while in hospital but no plans for any surgical intervention for the next week to allow tissues to demarcate further.  Caroline More, DPM 10/06/2020, 12:44 PM

## 2020-10-07 ENCOUNTER — Encounter: Payer: Self-pay | Admitting: Internal Medicine

## 2020-10-07 LAB — COMPREHENSIVE METABOLIC PANEL
ALT: 27 U/L (ref 0–44)
AST: 37 U/L (ref 15–41)
Albumin: 2.2 g/dL — ABNORMAL LOW (ref 3.5–5.0)
Alkaline Phosphatase: 255 U/L — ABNORMAL HIGH (ref 38–126)
Anion gap: 6 (ref 5–15)
BUN: 14 mg/dL (ref 6–20)
CO2: 28 mmol/L (ref 22–32)
Calcium: 8.3 mg/dL — ABNORMAL LOW (ref 8.9–10.3)
Chloride: 98 mmol/L (ref 98–111)
Creatinine, Ser: 0.8 mg/dL (ref 0.61–1.24)
GFR, Estimated: >60 mL/min (ref >60)
Glucose, Bld: 184 mg/dL — ABNORMAL HIGH (ref 70–99)
Potassium: 3.7 mmol/L (ref 3.5–5.1)
Sodium: 132 mmol/L — ABNORMAL LOW (ref 135–145)
Total Bilirubin: 0.8 mg/dL (ref 0.3–1.2)
Total Protein: 7.5 g/dL (ref 6.5–8.1)

## 2020-10-07 LAB — GLUCOSE, CAPILLARY
Glucose-Capillary: 168 mg/dL — ABNORMAL HIGH (ref 70–99)
Glucose-Capillary: 224 mg/dL — ABNORMAL HIGH (ref 70–99)
Glucose-Capillary: 169 mg/dL — ABNORMAL HIGH (ref 70–99)
Glucose-Capillary: 93 mg/dL (ref 70–99)

## 2020-10-07 LAB — CBC WITH DIFFERENTIAL/PLATELET
Abs Immature Granulocytes: 0.26 10*3/uL — ABNORMAL HIGH (ref 0.00–0.07)
Basophils Absolute: 0.1 10*3/uL (ref 0.0–0.1)
Basophils Relative: 1 %
Eosinophils Absolute: 0.2 10*3/uL (ref 0.0–0.5)
Eosinophils Relative: 2 %
HCT: 37.6 % — ABNORMAL LOW (ref 39.0–52.0)
Hemoglobin: 12.6 g/dL — ABNORMAL LOW (ref 13.0–17.0)
Immature Granulocytes: 2 %
Lymphocytes Relative: 27 %
Lymphs Abs: 3 10*3/uL (ref 0.7–4.0)
MCH: 32.3 pg (ref 26.0–34.0)
MCHC: 33.5 g/dL (ref 30.0–36.0)
MCV: 96.4 fL (ref 80.0–100.0)
Monocytes Absolute: 0.6 10*3/uL (ref 0.1–1.0)
Monocytes Relative: 5 %
Neutro Abs: 7 10*3/uL (ref 1.7–7.7)
Neutrophils Relative %: 63 %
Platelets: 354 10*3/uL (ref 150–400)
RBC: 3.9 MIL/uL — ABNORMAL LOW (ref 4.22–5.81)
RDW: 12.2 % (ref 11.5–15.5)
WBC: 11.1 10*3/uL — ABNORMAL HIGH (ref 4.0–10.5)
nRBC: 0 % (ref 0.0–0.2)

## 2020-10-07 LAB — MAGNESIUM: Magnesium: 1.6 mg/dL — ABNORMAL LOW (ref 1.7–2.4)

## 2020-10-07 LAB — PHOSPHORUS: Phosphorus: 3.8 mg/dL (ref 2.5–4.6)

## 2020-10-07 NOTE — Progress Notes (Addendum)
Progress Note    [name]  XIH:[id number] DOB: [birth year]  DOA: 09/27/2020 PCP: Coral Spikes, DO      Brief Narrative:    Medical records reviewed and are as summarized below:  [name] is a 50 y.o. male with medical history significant for CAD, hypertension, hyperlipidemia, diabetes mellitus, right diabetic foot ulcer.  He presented to the hospital because of pain, swelling and redness of the right foot with increasing drainage from the right foot ulcer.  He reported falling off his truck and sustaining a cut to his right foot after slipping on ice.  This happened a few days prior to admission  He was admitted to the hospital for sepsis secondary to right foot cellulitis with abscess and osteomyelitis.  He was treated with empiric IV antibiotics, IV fluids and analgesics.  He was seen in consultation by the podiatrist.  He underwent partial fifth ray amputation, incision and drainage of the right foot with placement of antibiotic beads on 10/01/2020.  He was also evaluated by the vascular surgeon and he underwent abdominal aortogram and right lower extremity distal runoff fat order catheter placement on 10/03/2020.  No hemodynamically significant stenosis was identified.  He has poorly controlled type 2 diabetes mellitus.  Hemoglobin A1c was 11.2. He was started on Lantus on NovoLog.    Assessment/Plan:   Principal Problem:   Cellulitis in diabetic foot ([place]) Active Problems:   Coronary artery disease involving native coronary artery of native heart without angina pectoris   Uncontrolled type 2 diabetes mellitus with circulatory disorder, without long-term current use of insulin (HCC)   Essential hypertension   Hyperlipidemia   Transaminitis   Body mass index is 29.53 kg/m.    Sepsis secondary to right foot cellulitis, abscess and osteomyelitis, right diabetic foot ulcer: s/p partial fifth ray amputation, incision and drainage of the right foot with placement  of antibiotic beads on 10/01/2020.  Continue IV antibiotics.  Patient will need repeat surgery of the right foot by podiatrist.  Follow-up with ID for further recommendations. S/p abdominal aortogram.  No significant blockage noted.  Type II DM with hyperglycemia: Hemoglobin A1c 11.2.  Continue Lantus and NovoLog.  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  Other comorbidities include CAD, hypertension, hyperlipidemia  Diet Order            Diet heart healthy/carb modified Room service appropriate? Yes; Fluid consistency: Thin  Diet effective now                    Consultants:  Podiatrist  Vascular surgeon  General surgeon  Procedures:  Partial fifth ray amputation, incision and drainage of multiple locations in the dorsal and plantar right foot, placement of antibiotic beads and placement of wound VAC on 10/01/2020.  Abdominal aortogram, right lower extremity distal runoff third order catheter placement on 10/03/2020    Medications:   . aspirin EC  81 mg Oral Daily  . atorvastatin  40 mg Oral Daily  . carvedilol  3.125 mg Oral BID WC  . chlorhexidine  60 mL Topical Once  . chlorhexidine  60 mL Topical Once  . chlorhexidine  60 mL Topical Once  . gabapentin  600 mg Oral QHS  . heparin  5,000 Units Subcutaneous Q8H  . insulin aspart  0-15 Units Subcutaneous TID WC  . insulin aspart  0-5 Units Subcutaneous QHS  . insulin aspart  8 Units Subcutaneous TID WC  . insulin glargine  30 Units Subcutaneous Daily  . lisinopril  5 mg Oral Daily  . melatonin  5 mg Oral QHS  . povidone-iodine  2 application Topical Once  . povidone-iodine  2 application Topical Once  . sodium chloride flush  3 mL Intravenous Q12H  . sodium chloride flush  3 mL Intravenous Q12H   Continuous Infusions: . sodium chloride    . ampicillin-sulbactam (UNASYN) IV 3 g (10/07/20 1342)      Anti-infectives (From admission, onward)   Start     Dose/Rate Route Frequency Ordered Stop   10/06/20 0600  ceFAZolin (ANCEF) IVPB 2g/100 mL premix        2 g 200 mL/hr over 30 Minutes Intravenous On call to O.R. 10/05/20 2241 10/07/20 0559   10/03/20 1045  ceFAZolin (ANCEF) IVPB 1 g/50 mL premix  Status:  Discontinued       Note to Pharmacy: To be given in specials   1 g 100 mL/hr over 30 Minutes Intravenous  Once 10/03/20 0956 10/03/20 1001   10/03/20 1045  ceFAZolin (ANCEF) IVPB 2g/100 mL premix       Note to Pharmacy: To be given in specials   2 g 200 mL/hr over 30 Minutes Intravenous  Once 10/03/20 1001 10/03/20 1245   10/02/20 1800  Ampicillin-Sulbactam (UNASYN) 3 g in sodium chloride 0.9 % 100 mL IVPB        3 g 200 mL/hr over 30 Minutes Intravenous Every 6 hours 10/02/20 1628     09/28/20 1800  vancomycin (VANCOREADY) IVPB 1500 mg/300 mL  Status:  Discontinued        1,500 mg 150 mL/hr over 120 Minutes Intravenous Every 12 hours 09/28/20 0915 09/28/20 1553   09/28/20 1800  linezolid (ZYVOX) IVPB 600 mg  Status:  Discontinued        600 mg 300 mL/hr over 60 Minutes Intravenous Every 12 hours 09/28/20 1553 10/02/20 1625   09/28/20 1700  piperacillin-tazobactam (ZOSYN) IVPB 3.375 g  Status:  Discontinued        3.375 g 12.5 mL/hr over 240 Minutes Intravenous Every 8 hours 09/28/20 1553 10/02/20 1624   09/28/20 0600  ceFEPIme (MAXIPIME) 2 g in sodium chloride 0.9 % 100 mL IVPB  Status:  Discontinued        2 g 200 mL/hr over 30 Minutes Intravenous Every 8 hours 09/28/20 0216 09/28/20 1553   09/28/20 0500  vancomycin (VANCOREADY) IVPB 1750 mg/350 mL  Status:  Discontinued        1,750 mg 175 mL/hr over 120 Minutes Intravenous Every 12 hours 09/28/20 0222 09/28/20 0915   09/28/20 0200  metroNIDAZOLE (FLAGYL) IVPB 500 mg  Status:  Discontinued        500 mg 100 mL/hr over 60 Minutes Intravenous Every 8 hours 09/27/20 2320 09/28/20 1552   09/27/20 2130  ceFEPIme (MAXIPIME)  2 g in sodium chloride 0.9 % 100 mL IVPB        2 g 200 mL/hr over 30 Minutes Intravenous  Once 09/27/20 2120 09/27/20 2310   09/27/20 2130  metroNIDAZOLE (FLAGYL) IVPB 500 mg  Status:  Discontinued        500 mg 100 mL/hr over 60 Minutes Intravenous  Once 09/27/20 2120 09/28/20 0122   09/27/20 2130  vancomycin (VANCOCIN) IVPB 1000 mg/200 mL premix        1,000 mg 200 mL/hr over 60 Minutes Intravenous  Once 09/27/20 2120 09/28/20 0106  Family Communication/Anticipated D/C date and plan/Code Status   DVT prophylaxis: heparin injection 5,000 Units Start: 09/27/20 2315     Code Status: Full Code  Family Communication: None Disposition Plan:    Status is: Inpatient  Remains inpatient appropriate because:IV treatments appropriate due to intensity of illness or inability to take PO   Dispo: The patient is from: Home              Anticipated d/c is to: Home              Anticipated d/c date is: > 3 days              Patient currently is not medically stable to d/c.   Difficult to place patient Yes           Subjective:   Interval events noted.  He complains of pain in the right foot.  Objective:    Vitals:   10/07/20 0017 10/07/20 0556 10/07/20 0749 10/07/20 1142  BP: 126/82 112/65 109/62 123/70  Pulse: 78 65 66 68  Resp: 16 16 14 14   Temp: 98 F (36.7 C) 97.8 F (36.6 C) 97.7 F (36.5 C) 97.7 F (36.5 C)  TempSrc: Oral Oral Oral Oral  SpO2: 97% 98% 97% 95%  Weight:      Height:       No data found.   Intake/Output Summary (Last 24 hours) at 10/07/2020 1358 Last data filed at 10/07/2020 1357 Gross per 24 hour  Intake 720 ml  Output 1200 ml  Net -480 ml   Filed Weights   09/27/20 1859 09/29/20 1000  Weight: 104.3 kg 104.3 kg    Exam:  GEN: NAD SKIN: Chronic superficial wound on left lateral leg.  Right foot wound looks good.  Patient was examined with podiatrist at the bedside. EYES: EOMI ENT: MMM CV: RRR PULM: CTA B ABD:  soft, obese, NT, +BS CNS: AAO x 3, non focal EXT: No edema or tenderness            Data Reviewed:   I have personally reviewed following labs and imaging studies:  Labs: Labs show the following:   Basic Metabolic Panel: Recent Labs  Lab 10/03/20 0651 10/04/20 0950 10/05/20 0513 10/06/20 0529 10/07/20 0350  NA 134* 134* 134* 134* 132*  K 4.1 4.5 4.1 3.8 3.7  CL 97* 97* 97* 99 98  CO2 28 26 28 26 28   GLUCOSE 230* 197* 241* 212* 184*  BUN 11 12 13 13 14   CREATININE 0.98 0.94 0.83 0.91 0.80  CALCIUM 8.4* 8.4* 8.6* 8.7* 8.3*  MG  --  1.9 1.8 1.8 1.6*  PHOS  --  3.5 3.9 3.8 3.8   GFR Estimated Creatinine Clearance: 142.2 mL/min (by C-G formula based on SCr of 0.8 mg/dL). Liver Function Tests: Recent Labs  Lab 10/04/20 0950 10/05/20 0513 10/06/20 0529 10/07/20 0350  AST 44* 35 32 37  ALT 34 28 26 27   ALKPHOS 300* 263* 259* 255*  BILITOT 0.7 0.7 0.6 0.8  PROT 7.7 7.2 7.4 7.5  ALBUMIN 2.1* 2.0* 2.1* 2.2*   No results for input(s): LIPASE, AMYLASE in the last 168 hours. No results for input(s): AMMONIA in the last 168 hours. Coagulation profile No results for input(s): INR, PROTIME in the last 168 hours.  CBC: Recent Labs  Lab 10/01/20 0610 10/04/20 0950 10/05/20 0513 10/06/20 0529 10/07/20 0350  WBC 13.3* 11.2* 9.2 11.3* 11.1*  NEUTROABS  --  7.6 5.9 7.3 7.0  HGB  12.8* 12.6* 12.3* 12.6* 12.6*  HCT 37.2* 37.8* 36.8* 37.1* 37.6*  MCV 94.4 97.7 96.3 96.4 96.4  PLT 232 296 293 315 354   Cardiac Enzymes: No results for input(s): CKTOTAL, CKMB, CKMBINDEX, TROPONINI in the last 168 hours. BNP (last 3 results) No results for input(s): PROBNP in the last 8760 hours. CBG: Recent Labs  Lab 10/06/20 1204 10/06/20 1611 10/06/20 1938 10/07/20 0750 10/07/20 1143  GLUCAP 167* 180* 94 168* 224*   D-Dimer: No results for input(s): DDIMER in the last 72 hours. Hgb A1c: No results for input(s): HGBA1C in the last 72 hours. Lipid Profile: Recent Labs     10/05/20 0513  CHOL 140  HDL 23*  LDLCALC 67  TRIG 249*  CHOLHDL 6.1   Thyroid function studies: No results for input(s): TSH, T4TOTAL, T3FREE, THYROIDAB in the last 72 hours.  Invalid input(s): FREET3 Anemia work up: No results for input(s): VITAMINB12, FOLATE, FERRITIN, TIBC, IRON, RETICCTPCT in the last 72 hours. Sepsis Labs: Recent Labs  Lab 10/04/20 0950 10/05/20 0513 10/06/20 0529 10/07/20 0350  WBC 11.2* 9.2 11.3* 11.1*    Microbiology Recent Results (from the past 240 hour(s))  Culture, blood (routine x 2)     Status: None   Collection Time: 09/27/20  9:48 PM   Specimen: BLOOD  Result Value Ref Range Status   Specimen Description BLOOD RIGHT ANTECUBITAL  Final   Special Requests   Final    BOTTLES DRAWN AEROBIC AND ANAEROBIC Blood Culture adequate volume   Culture   Final    NO GROWTH 5 DAYS Performed at [facility], [address] [place], [place] [address]    Report Status 10/02/2020 FINAL  Final  Resp Panel by RT-PCR (Flu A&B, Covid) Nasopharyngeal Swab     Status: None   Collection Time: 09/27/20  9:48 PM   Specimen: Nasopharyngeal Swab; Nasopharyngeal(NP) swabs in vial transport medium  Result Value Ref Range Status   SARS Coronavirus 2 by RT PCR NEGATIVE NEGATIVE Final    Comment: (NOTE) SARS-CoV-2 target nucleic acids are NOT DETECTED.  The SARS-CoV-2 RNA is generally detectable in upper respiratory specimens during the acute phase of infection. The lowest concentration of SARS-CoV-2 viral copies this assay can detect is 138 copies/mL. A negative result does not preclude SARS-Cov-2 infection and should not be used as the sole basis for treatment or other patient management decisions. A negative result may occur with  improper specimen collection/handling, submission of specimen other than nasopharyngeal swab, presence of viral mutation(s) within the areas targeted by this assay, and inadequate number of viral copies(<138  copies/mL). A negative result must be combined with clinical observations, patient history, and epidemiological information. The expected result is Negative.  Fact Sheet for Patients:  EntrepreneurPulse.com.au  Fact Sheet for Healthcare Providers:  IncredibleEmployment.be  This test is no t yet approved or cleared by the [place] FDA and  has been authorized for detection and/or diagnosis of SARS-CoV-2 by FDA under an Emergency Use Authorization (EUA). This EUA will remain  in effect (meaning this test can be used) for the duration of the COVID-19 declaration under Section 564(b)(1) of the Act, 21 U.S.C.section 360bbb-3(b)(1), unless the authorization is terminated  or revoked sooner.       Influenza A by PCR NEGATIVE NEGATIVE Final   Influenza B by PCR NEGATIVE NEGATIVE Final    Comment: (NOTE) The Xpert Xpress SARS-CoV-2/FLU/RSV plus assay is intended as an aid in the diagnosis of influenza from Nasopharyngeal swab specimens  and should not be used as a sole basis for treatment. Nasal washings and aspirates are unacceptable for Xpert Xpress SARS-CoV-2/FLU/RSV testing.  Fact Sheet for Patients: EntrepreneurPulse.com.au  Fact Sheet for Healthcare Providers: IncredibleEmployment.be  This test is not yet approved or cleared by the [place] FDA and has been authorized for detection and/or diagnosis of SARS-CoV-2 by FDA under an Emergency Use Authorization (EUA). This EUA will remain in effect (meaning this test can be used) for the duration of the COVID-19 declaration under Section 564(b)(1) of the Act, 21 U.S.C. section 360bbb-3(b)(1), unless the authorization is terminated or revoked.  Performed at [facility], [address] [place], [place] [address]   Culture, blood (routine x 2)     Status: None   Collection Time: 09/27/20 10:31 PM   Specimen: BLOOD  Result Value Ref Range  Status   Specimen Description BLOOD LEFT ANTECUBITAL  Final   Special Requests   Final    BOTTLES DRAWN AEROBIC AND ANAEROBIC Blood Culture adequate volume   Culture   Final    NO GROWTH 5 DAYS Performed at [facility], [address], [place], [place] [address]    Report Status 10/02/2020 FINAL  Final  Aerobic/Anaerobic Culture (surgical/deep wound)     Status: None   Collection Time: 09/29/20 10:49 AM   Specimen: Wound  Result Value Ref Range Status   Specimen Description   Final    WOUND Performed at [facility], [address], [place], [place] [address]    Special Requests   Final    right foot culture Performed at [facility], [place], [place] [address]    Gram Stain   Final    RARE WBC PRESENT,BOTH PMN AND MONONUCLEAR RARE GRAM POSITIVE COCCI IN PAIRS    Culture   Final    RARE GROUP B STREP(S.AGALACTIAE)ISOLATED TESTING AGAINST S. AGALACTIAE NOT ROUTINELY PERFORMED DUE TO PREDICTABILITY OF AMP/PEN/VAN SUSCEPTIBILITY. NO ANAEROBES ISOLATED Performed at [facility], [place] [address], [place], [place] [address]    Report Status 10/04/2020 FINAL  Final    Procedures and diagnostic studies:  No results found.             LOS: 9 days   Irelyn Perfecto  [employer], advertising on [email or web address]. If 7PM-7AM, please contact night-coverage at [email or web address]     10/07/2020, 1:58 PM

## 2020-10-07 NOTE — Progress Notes (Signed)
PODIATRY / FOOT AND ANKLE SURGERY PROGRESS NOTE  Reason for consult: Right foot infection    HPI: [name] is a 50 y.o. male who presents status post right partial fifth ray amputation with incision and drainage with application of antibiotic beads resting in bed comfortably. Patient still complains of pain at the level of the amputation site. Patient notes improvements in redness and swelling overall and is feeling better. Patient is still maintain for the most part nonweightbearing on the right side has been performing heel contact transfers.  PMHx:  Past Medical History:  Diagnosis Date  . Diabetes mellitus without complication ([place])   . Heart disease   . Myocardial infarction [facility])     Surgical Hx:  Past Surgical History:  Procedure Laterality Date  . AMPUTATION Right 10/01/2020   Procedure: AMPUTATION 5th  RAY AND IRRIGATION AND DEBRIDEMENT;  Surgeon: Samara Deist, DPM;  Location: [facility] ORS;  Service: Podiatry;  Laterality: Right;  . BACK SURGERY    . INCISION AND DRAINAGE Right 09/29/2020   Procedure: INCISION AND DRAINAGE, RIGHT FOOT;  Surgeon: Samara Deist, DPM;  Location: [facility] ORS;  Service: Podiatry;  Laterality: Right;  . LOWER EXTREMITY ANGIOGRAPHY Right 10/03/2020   Procedure: Lower Extremity Angiography;  Surgeon: Katha Cabal, MD;  Location: [facility];  Service: Cardiovascular;  Laterality: Right;    FHx:  Family History  Problem Relation Age of Onset  . Diabetes Father   . Heart disease Father   . Breast cancer Mother   . Lung cancer Paternal Grandmother     Social History:  reports that he has been smoking cigarettes. He has been smoking about 1.00 pack per day. He has never used smokeless tobacco. He reports current alcohol use. He reports that he does not use drugs.  Allergies: No Known Allergies  Medications Prior to Admission  Medication Sig Dispense Refill  . atorvastatin (LIPITOR) 40 MG tablet Take 1 tablet (40 mg total) by  mouth daily. 90 tablet 3  . Blood Glucose Monitoring Suppl (RELION PRIME MONITOR) DEVI Use up to 4 times daily to check blood sugars. 1 Device 0  . clopidogrel (PLAVIX) 75 MG tablet TAKE ONE TABLET BY MOUTH ONCE DAILY (Patient not taking: Reported on 09/28/2020) 30 tablet 0  . empagliflozin (JARDIANCE) 10 MG TABS tablet Take 10 mg by mouth daily. (Patient not taking: Reported on 09/28/2020) 90 tablet 0  . gabapentin (NEURONTIN) 300 MG capsule Take 600 mg by mouth at bedtime as needed.    Marland Kitchen glipiZIDE (GLUCOTROL XL) 10 MG 24 hr tablet Take 10 mg by mouth daily.    Marland Kitchen glucose blood (RELION PRIME TEST) test strip Use as instructed 100 each 12  . lisinopril (PRINIVIL,ZESTRIL) 5 MG tablet TAKE ONE TABLET BY MOUTH ONCE DAILY 30 tablet 0  . meloxicam (MOBIC) 7.5 MG tablet Take 7.5 mg by mouth 2 (two) times daily.    . metFORMIN (GLUCOPHAGE) 500 MG tablet TAKE ONE TABLET BY MOUTH TWICE DAILY WITH A MEAL 30 tablet 0  . ReliOn Ultra Thin Lancets MISC Use as directed to take blood sugar. 200 each 0  . sildenafil (VIAGRA) 25 MG tablet Take 1 tablet by mouth daily.    Marland Kitchen sulfamethoxazole-trimethoprim (BACTRIM DS,SEPTRA DS) 800-160 MG tablet Take 1 tablet by mouth 2 (two) times daily. (Patient not taking: Reported on 09/28/2020) 20 tablet 0    Physical Exam: General: Alert and oriented.  No apparent distress.  Vascular: DP/PT pulses faintly palpable bilateral, no hair  growth noted to digits bilateral.  Capillary fill time appears to be intact to both feet.  Neuro: Light touch sensation reduced to bilateral lower extremities.   Derm: Wound beds appear to be stable at this time to the plantar foot and lateral foot right.  Patient appears to have bone exposed at the level of the operative site.  Erythema and edema appear to be greatly improved overall.  Concern as patient appears to have some dusky changes over the dorsal lateral aspect of the right foot with some scattered areas of necrosis. Necrosis becoming more  apparent today to the dorsal lateral foot skin. Patient did have a minimal ALT that was so sweet amount of active bleeding present when the dressing was removed at the proximal aspect of the incision line at the fifth metatarsal base area.  No purulence able to be expressed, no fluctuance.  Plantar incision line appears to be stable at this time but does appear to have macerated margins around the entirety of the incision and wound.      MSK: Right partial fifth ray amputation.  Limited ankle joint dorsiflexion noted with the knee extended but mildly improved with knee flexion bilateral.  Results for orders placed or performed during the hospital encounter of 09/27/20 (from the past 48 hour(s))  Glucose, capillary     Status: Abnormal   Collection Time: 10/05/20  5:11 PM  Result Value Ref Range   Glucose-Capillary 173 (H) 70 - 99 mg/dL    Comment: Glucose reference range applies only to samples taken after fasting for at least 8 hours.  Glucose, capillary     Status: Abnormal   Collection Time: 10/05/20  9:00 PM  Result Value Ref Range   Glucose-Capillary 166 (H) 70 - 99 mg/dL    Comment: Glucose reference range applies only to samples taken after fasting for at least 8 hours.  Comprehensive metabolic panel     Status: Abnormal   Collection Time: 10/06/20  5:29 AM  Result Value Ref Range   Sodium 134 (L) 135 - 145 mmol/L   Potassium 3.8 3.5 - 5.1 mmol/L   Chloride 99 98 - 111 mmol/L   CO2 26 22 - 32 mmol/L   Glucose, Bld 212 (H) 70 - 99 mg/dL    Comment: Glucose reference range applies only to samples taken after fasting for at least 8 hours.   BUN 13 6 - 20 mg/dL   Creatinine, Ser 0.91 0.61 - 1.24 mg/dL   Calcium 8.7 (L) 8.9 - 10.3 mg/dL   Total Protein 7.4 6.5 - 8.1 g/dL   Albumin 2.1 (L) 3.5 - 5.0 g/dL   AST 32 15 - 41 U/L   ALT 26 0 - 44 U/L   Alkaline Phosphatase 259 (H) 38 - 126 U/L   Total Bilirubin 0.6 0.3 - 1.2 mg/dL   GFR, Estimated >60 >60 mL/min    Comment:  (NOTE) Calculated using the CKD-EPI Creatinine Equation (2021)    Anion gap 9 5 - 15    Comment: Performed at [facility], [address] [place], [place] [address]  Magnesium     Status: None   Collection Time: 10/06/20  5:29 AM  Result Value Ref Range   Magnesium 1.8 1.7 - 2.4 mg/dL    Comment: Performed at [facility], [address], [place], [place] [address]  Phosphorus     Status: None   Collection Time: 10/06/20  5:29 AM  Result Value Ref Range   Phosphorus  3.8 2.5 - 4.6 mg/dL    Comment: Performed at [facility], [address] [place], [place] [address]  CBC with Differential/Platelet     Status: Abnormal   Collection Time: 10/06/20  5:29 AM  Result Value Ref Range   WBC 11.3 (H) 4.0 - 10.5 K/uL   RBC 3.85 (L) 4.22 - 5.81 MIL/uL   Hemoglobin 12.6 (L) 13.0 - 17.0 g/dL   HCT 37.1 (L) 39.0 - 52.0 %   MCV 96.4 80.0 - 100.0 fL   MCH 32.7 26.0 - 34.0 pg   MCHC 34.0 30.0 - 36.0 g/dL   RDW 12.3 11.5 - 15.5 %   Platelets 315 150 - 400 K/uL   nRBC 0.0 0.0 - 0.2 %   Neutrophils Relative % 64 %   Neutro Abs 7.3 1.7 - 7.7 K/uL   Lymphocytes Relative 26 %   Lymphs Abs 2.9 0.7 - 4.0 K/uL   Monocytes Relative 5 %   Monocytes Absolute 0.6 0.1 - 1.0 K/uL   Eosinophils Relative 2 %   Eosinophils Absolute 0.2 0.0 - 0.5 K/uL   Basophils Relative 0 %   Basophils Absolute 0.1 0.0 - 0.1 K/uL   Immature Granulocytes 3 %   Abs Immature Granulocytes 0.28 (H) 0.00 - 0.07 K/uL    Comment: Performed at [facility], [address] [place], [place] [address]  Glucose, capillary     Status: Abnormal   Collection Time: 10/06/20  7:56 AM  Result Value Ref Range   Glucose-Capillary 204 (H) 70 - 99 mg/dL    Comment: Glucose reference range applies only to samples taken after fasting for at least 8 hours.  Glucose, capillary     Status: Abnormal   Collection Time: 10/06/20 12:04 PM  Result Value Ref Range   Glucose-Capillary 167 (H) 70 - 99 mg/dL     Comment: Glucose reference range applies only to samples taken after fasting for at least 8 hours.  Glucose, capillary     Status: Abnormal   Collection Time: 10/06/20  4:11 PM  Result Value Ref Range   Glucose-Capillary 180 (H) 70 - 99 mg/dL    Comment: Glucose reference range applies only to samples taken after fasting for at least 8 hours.  Glucose, capillary     Status: None   Collection Time: 10/06/20  7:38 PM  Result Value Ref Range   Glucose-Capillary 94 70 - 99 mg/dL    Comment: Glucose reference range applies only to samples taken after fasting for at least 8 hours.  Comprehensive metabolic panel     Status: Abnormal   Collection Time: 10/07/20  3:50 AM  Result Value Ref Range   Sodium 132 (L) 135 - 145 mmol/L   Potassium 3.7 3.5 - 5.1 mmol/L   Chloride 98 98 - 111 mmol/L   CO2 28 22 - 32 mmol/L   Glucose, Bld 184 (H) 70 - 99 mg/dL    Comment: Glucose reference range applies only to samples taken after fasting for at least 8 hours.   BUN 14 6 - 20 mg/dL   Creatinine, Ser 0.80 0.61 - 1.24 mg/dL   Calcium 8.3 (L) 8.9 - 10.3 mg/dL   Total Protein 7.5 6.5 - 8.1 g/dL   Albumin 2.2 (L) 3.5 - 5.0 g/dL   AST 37 15 - 41 U/L   ALT 27 0 - 44 U/L   Alkaline Phosphatase 255 (H) 38 - 126 U/L   Total Bilirubin 0.8 0.3 - 1.2 mg/dL  GFR, Estimated >60 >60 mL/min    Comment: (NOTE) Calculated using the CKD-EPI Creatinine Equation (2021)    Anion gap 6 5 - 15    Comment: Performed at [facility], [address] [place], [place] [address]  Magnesium     Status: Abnormal   Collection Time: 10/07/20  3:50 AM  Result Value Ref Range   Magnesium 1.6 (L) 1.7 - 2.4 mg/dL    Comment: Performed at [facility], [address] [place], [place] [address]  Phosphorus     Status: None   Collection Time: 10/07/20  3:50 AM  Result Value Ref Range   Phosphorus 3.8 2.5 - 4.6 mg/dL    Comment: Performed at [facility], [address] [place], [place] [address]   CBC with Differential/Platelet     Status: Abnormal   Collection Time: 10/07/20  3:50 AM  Result Value Ref Range   WBC 11.1 (H) 4.0 - 10.5 K/uL   RBC 3.90 (L) 4.22 - 5.81 MIL/uL   Hemoglobin 12.6 (L) 13.0 - 17.0 g/dL   HCT 37.6 (L) 39.0 - 52.0 %   MCV 96.4 80.0 - 100.0 fL   MCH 32.3 26.0 - 34.0 pg   MCHC 33.5 30.0 - 36.0 g/dL   RDW 12.2 11.5 - 15.5 %   Platelets 354 150 - 400 K/uL   nRBC 0.0 0.0 - 0.2 %   Neutrophils Relative % 63 %   Neutro Abs 7.0 1.7 - 7.7 K/uL   Lymphocytes Relative 27 %   Lymphs Abs 3.0 0.7 - 4.0 K/uL   Monocytes Relative 5 %   Monocytes Absolute 0.6 0.1 - 1.0 K/uL   Eosinophils Relative 2 %   Eosinophils Absolute 0.2 0.0 - 0.5 K/uL   Basophils Relative 1 %   Basophils Absolute 0.1 0.0 - 0.1 K/uL   Immature Granulocytes 2 %   Abs Immature Granulocytes 0.26 (H) 0.00 - 0.07 K/uL    Comment: Performed at [facility], [address] [place], [place] [address]  Glucose, capillary     Status: Abnormal   Collection Time: 10/07/20  7:50 AM  Result Value Ref Range   Glucose-Capillary 168 (H) 70 - 99 mg/dL    Comment: Glucose reference range applies only to samples taken after fasting for at least 8 hours.   No results found.  Blood pressure 109/62, pulse 66, temperature 97.7 F (36.5 C), temperature source Oral, resp. rate 14, height 6' 2"  (1.88 m), weight 104.3 kg, SpO2 97 %.  Assessment 1. 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 Diabetes type 2 polyneuropathy 3. PVD 4. History of tobacco abuse/smoking 5. Equinus  Plan -Patient seen and examined. -Area of necrosis appears to be demarcating further to the dorsal and plantar skin flaps with mild maceration present. Overall appears to be stable with no worsening signs of infection. -Redressed today with Betadine to macerated areas followed by Xeroform to the wound bed, 4 x 4 gauze, ABD,  Kerlix, Ace wrap. Patient to continue with daily dressing changes per orders. -Patient can be partial weightbearing with heel contact in a surgical shoe for transfers but otherwise needs to stay off of the right foot. -Dressing changes ordered.  Recommend Betadine wet-to-dry dressings daily as per orders. -Appreciate vascular recommendations. -Appreciate antibiotic therapy recommendations per medicine.  Previous culture growing group B strep.  Appreciate infectious disease recommendations. Patient likely needs IV antibiotics for  2 to 6 weeks. -Patient will need surgery at some point next week consisting of transmetatarsal amputation. Area of demarcation still appears to be continuing and the dorsal skin flap does not appear to be very viable at this time. We will potentially try to perform salvage transmetatarsal amputation towards the latter half next week. Patient will still be in the hospital during that time due to no insurance and great need of care. -Patient to continue staying off the right foot is much as possible and to use heel for transfers only. -We will reevaluate again on Monday and plan for surgery at some point next week.  Caroline More, DPM 10/07/2020, 11:37 AM

## 2020-10-07 NOTE — Progress Notes (Signed)
Occupational Therapy Treatment Patient Details Name: [name] MRN: [id number] DOB: 1971/04/12 Today's Date: 10/07/2020    History of present illness [name] is a 50 y.o. male is status post right partial fifth ray amputation with incision and drainage with application of antibiotic beads and wound VAC.  Patient has remained nonweightbearing since procedure.  He presents with R foot infection; cellulitis in diabetic foot.   OT comments  [name] was seen for OT treatment on this date. Upon arrival to room pt reclined in bed agreeable to session. MOD I exit L side of bed. MOD I don L sock seated EOB, reaches outside BOS in sitting with no LOB and w/o WBing in heel. Of note, offloading heel wedge shoe not in room and unable to trial t/fs this session. PT in to assist and located post op shoe. MIN A don L post-op shoe seated EOB. Left in room with PT for t/f training session. Pt making good progress toward goals. Pt continues to benefit from skilled OT services to maximize return to PLOF and minimize risk of future falls, injury, caregiver burden, and readmission. Will continue to follow POC. Discharge recommendation remains appropriate.    Follow Up Recommendations  Home health OT;Supervision - Intermittent    Equipment Recommendations  Other (comment) (drop arm commode chair and RW)    Recommendations for Other Services      Precautions / Restrictions Precautions Precautions: Fall Restrictions Weight Bearing Restrictions: Yes RLE Weight Bearing: Non weight bearing RLE Partial Weight Bearing Percentage or Pounds: MIN A grooming standing sinkside - VCs for integrating dominant RUE. MIN VCs self-drinking seated EOC - cues to integrate RUE       Mobility Bed Mobility Overal bed mobility: Modified Independent                   Balance Overall balance assessment: Needs assistance Sitting-balance support: Feet supported;No upper extremity supported Sitting  balance-Leahy Scale: Normal                                     ADL either performed or assessed with clinical judgement   ADL Overall ADL's : Needs assistance/impaired                                       General ADL Comments: MOD I don L sock, MIN A don L post-op shoe.               Cognition Arousal/Alertness: Awake/alert Behavior During Therapy: Flat affect;WFL for tasks assessed/performed Overall Cognitive Status: Within Functional Limits for tasks assessed                                          Exercises Exercises: Other exercises Other Exercises Other Exercises: Pt educated re: OT role, DME recs, d/c recs, falls prevention, ECS, HEP, adapted dressing techniques Other Exercises: LBD, bed mobility, seated balance exercises, seated LLE exercises           Pertinent Vitals/ Pain       Pain Assessment: 0-10 Faces Pain Scale: Hurts little more         Frequency  Min 2X/week        Progress Toward Goals  OT Goals(current goals can now be found in the care plan section)  Progress towards OT goals: Progressing toward goals  Acute Rehab OT Goals Patient Stated Goal: to get home OT Goal Formulation: With patient Time For Goal Achievement: 10/20/20 Potential to Achieve Goals: Good ADL Goals Pt Will Perform Grooming: with modified independence;sitting Pt Will Transfer to Toilet: with modified independence;bedside commode Pt Will Perform Toileting - Clothing Manipulation and hygiene: with modified independence;sit to/from stand;sitting/lateral leans  Plan Discharge plan remains appropriate;Frequency remains appropriate       AM-PAC OT "6 Clicks" Daily Activity     Outcome Measure   Help from another person eating meals?: None Help from another person taking care of personal grooming?: None Help from another person toileting, which includes using toliet, bedpan, or urinal?: A Lot Help from another person  bathing (including washing, rinsing, drying)?: A Lot Help from another person to put on and taking off regular upper body clothing?: None Help from another person to put on and taking off regular lower body clothing?: A Little 6 Click Score: 19    End of Session    OT Visit Diagnosis: Unsteadiness on feet (R26.81);Muscle weakness (generalized) (M62.81);History of falling (Z91.81)   Activity Tolerance Patient tolerated treatment well   Patient Left in bed;with call bell/phone within reach;Other (comment) (PT in room)   Nurse Communication          Time: 6812-7517 OT Time Calculation (min): 10 min  Charges: OT General Charges $OT Visit: 1 Visit OT Treatments $Self Care/Home Management : 8-22 mins  Dessie Coma, M.S. OTR/L  10/07/20, 12:52 PM  ascom [phone]

## 2020-10-07 NOTE — Progress Notes (Signed)
Physical Therapy Treatment Patient Details Name: [name] MRN: [id number] DOB: Jun 12, 1971 Today's Date: 10/07/2020    History of Present Illness [name] is a 50 y.o. male is status post right partial fifth ray amputation with incision and drainage with application of antibiotic beads and wound VAC.  Patient has remained nonweightbearing since procedure.  He presents with R foot infection; cellulitis in diabetic foot.    PT Comments    Pt was sitting EOB finishing up OT upon author arriving. He agrees to PT session. Pt has post op shoe in room however no heel wedge (offloading) shoe. Called [facility] and requested one. Pt has very flat affect but was agreeable to OOB activity. No physical assistance required however max vcs for improved technique and to NWB on RLE when getting up. He struggles maintaining NWB throughout all standing activity however has no unsteadiness or risk of falling. Did issue there ex handout to promote increased activity/ strengthening throughout the day. A lot of time reinforcing importance of proper positioning and promoting elevation of RLE to help with healing process. He states understanding. At conclusion of session, pt was in recliner with call bell in reach and chair alram in place.    Follow Up Recommendations  Home health PT;Supervision - Intermittent;Supervision for mobility/OOB     Equipment Recommendations  Rolling walker with 5" wheels;3in1 (PT)    Recommendations for Other Services       Precautions / Restrictions Precautions Precautions: Fall Restrictions Weight Bearing Restrictions: Yes RLE Weight Bearing: Non weight bearing RLE Partial Weight Bearing Percentage or Pounds: MIN A grooming standing sinkside - VCs for integrating dominant RUE. MIN VCs self-drinking seated EOC - cues to integrate RUE    Mobility  Bed Mobility Overal bed mobility: Modified Independent    General bed mobility comments: Was sitting  EOB upon arriving.    Transfers Overall transfer level: Needs assistance Equipment used: Rolling walker (2 wheeled) Transfers: Sit to/from Stand Sit to Stand: Supervision         General transfer comment: Pt required no physical assistance however max vcs for proper wt bearing and improved technique  Ambulation/Gait Ambulation/Gait assistance: Supervision Gait Distance (Feet): 5 Feet Assistive device: Rolling walker (2 wheeled) Gait Pattern/deviations:  (" hop to") Gait velocity: decreased   General Gait Details: Pt was able to go from EOB to recliner without physical assistance however poor ability to maintain NWB.       Balance Overall balance assessment: Needs assistance Sitting-balance support: Feet supported;No upper extremity supported Sitting balance-Leahy Scale: Normal     Standing balance support: During functional activity;Bilateral upper extremity supported Standing balance-Leahy Scale: Fair       Cognition Arousal/Alertness: Awake/alert Behavior During Therapy: Flat affect;WFL for tasks assessed/performed Overall Cognitive Status: Within Functional Limits for tasks assessed    General Comments: Pt finishing OT upon arriving. A and O x 4 but very flat      Exercises Other Exercises Other Exercises: Pt educated re: OT role, DME recs, d/c recs, falls prevention, ECS, HEP, adapted dressing techniques Other Exercises: LBD, bed mobility, seated balance exercises, seated LLE exercises        Pertinent Vitals/Pain Pain Assessment: 0-10 Pain Score: 2  Faces Pain Scale: Hurts a little bit Pain Location: R foot Pain Descriptors / Indicators: Aching;Sharp Pain Intervention(s): Limited activity within patient's tolerance;Monitored during session;Premedicated before session           PT Goals (current goals can now be found in the  care plan section) Acute Rehab PT Goals Patient Stated Goal: to get home Progress towards PT goals: Progressing toward  goals    Frequency    7X/week      PT Plan Current plan remains appropriate       AM-PAC PT "6 Clicks" Mobility   Outcome Measure  Help needed turning from your back to your side while in a flat bed without using bedrails?: None Help needed moving from lying on your back to sitting on the side of a flat bed without using bedrails?: None Help needed moving to and from a bed to a chair (including a wheelchair)?: A Little Help needed standing up from a chair using your arms (e.g., wheelchair or bedside chair)?: A Little Help needed to walk in hospital room?: A Little Help needed climbing 3-5 steps with a railing? : A Little 6 Click Score: 20    End of Session Equipment Utilized During Treatment: Gait belt Activity Tolerance: Patient limited by fatigue;No increased pain Patient left: in bed;with call bell/phone within reach;with bed alarm set Nurse Communication: Mobility status PT Visit Diagnosis: Unsteadiness on feet (R26.81);Difficulty in walking, not elsewhere classified (R26.2);Muscle weakness (generalized) (M62.81)     Time: 3235-5732 PT Time Calculation (min) (ACUTE ONLY): 13 min  Charges:  $Therapeutic Activity: 8-22 mins                     Julaine Fusi PTA 10/07/20, 1:22 PM

## 2020-10-08 DIAGNOSIS — A419 Sepsis, unspecified organism: Secondary | ICD-10-CM | POA: Diagnosis present

## 2020-10-08 LAB — CBC WITH DIFFERENTIAL/PLATELET
Abs Immature Granulocytes: 0.18 10*3/uL — ABNORMAL HIGH (ref 0.00–0.07)
Basophils Absolute: 0.1 10*3/uL (ref 0.0–0.1)
Basophils Relative: 1 %
Eosinophils Absolute: 0.2 10*3/uL (ref 0.0–0.5)
Eosinophils Relative: 2 %
HCT: 37 % — ABNORMAL LOW (ref 39.0–52.0)
Hemoglobin: 12.6 g/dL — ABNORMAL LOW (ref 13.0–17.0)
Immature Granulocytes: 2 %
Lymphocytes Relative: 32 %
Lymphs Abs: 2.9 10*3/uL (ref 0.7–4.0)
MCH: 32.6 pg (ref 26.0–34.0)
MCHC: 34.1 g/dL (ref 30.0–36.0)
MCV: 95.9 fL (ref 80.0–100.0)
Monocytes Absolute: 0.5 10*3/uL (ref 0.1–1.0)
Monocytes Relative: 6 %
Neutro Abs: 5.4 10*3/uL (ref 1.7–7.7)
Neutrophils Relative %: 57 %
Platelets: 321 10*3/uL (ref 150–400)
RBC: 3.86 MIL/uL — ABNORMAL LOW (ref 4.22–5.81)
RDW: 12.4 % (ref 11.5–15.5)
WBC: 9.3 10*3/uL (ref 4.0–10.5)
nRBC: 0 % (ref 0.0–0.2)

## 2020-10-08 LAB — COMPREHENSIVE METABOLIC PANEL
ALT: 25 U/L (ref 0–44)
AST: 32 U/L (ref 15–41)
Albumin: 2.2 g/dL — ABNORMAL LOW (ref 3.5–5.0)
Alkaline Phosphatase: 227 U/L — ABNORMAL HIGH (ref 38–126)
Anion gap: 7 (ref 5–15)
BUN: 17 mg/dL (ref 6–20)
CO2: 26 mmol/L (ref 22–32)
Calcium: 8.5 mg/dL — ABNORMAL LOW (ref 8.9–10.3)
Chloride: 99 mmol/L (ref 98–111)
Creatinine, Ser: 1.01 mg/dL (ref 0.61–1.24)
GFR, Estimated: >60 mL/min (ref >60)
Glucose, Bld: 179 mg/dL — ABNORMAL HIGH (ref 70–99)
Potassium: 3.9 mmol/L (ref 3.5–5.1)
Sodium: 132 mmol/L — ABNORMAL LOW (ref 135–145)
Total Bilirubin: 0.8 mg/dL (ref 0.3–1.2)
Total Protein: 7.3 g/dL (ref 6.5–8.1)

## 2020-10-08 LAB — GLUCOSE, CAPILLARY
Glucose-Capillary: 162 mg/dL — ABNORMAL HIGH (ref 70–99)
Glucose-Capillary: 163 mg/dL — ABNORMAL HIGH (ref 70–99)
Glucose-Capillary: 174 mg/dL — ABNORMAL HIGH (ref 70–99)
Glucose-Capillary: 137 mg/dL — ABNORMAL HIGH (ref 70–99)

## 2020-10-08 LAB — MAGNESIUM: Magnesium: 1.7 mg/dL (ref 1.7–2.4)

## 2020-10-08 LAB — PHOSPHORUS: Phosphorus: 3.8 mg/dL (ref 2.5–4.6)

## 2020-10-08 MED ORDER — FLUTICASONE PROPIONATE 50 MCG/ACT NA SUSP
2.0000 | Freq: Every day | NASAL | Status: DC
  Administered 2020-10-08 – 2020-10-14 (×7): 2 via NASAL
  Filled 2020-10-08: qty 16

## 2020-10-08 NOTE — Progress Notes (Signed)
PT Cancellation Note  Patient Details Name: [name] MRN: [id number] DOB: [birth year]   Cancelled Treatment:    Reason Eval/Treat Not Completed: Pain limiting ability to participate  Pt reported headache this am.  Stated RN is aware and is getting medication.  Requested to defer PT session today.  Will continue tomorrow as appropriate.    Danielle Dess 10/08/2020, 10:17 AM

## 2020-10-08 NOTE — Progress Notes (Addendum)
Progress Note    [name]  LXB:[id number] DOB: Sep 03, 1970  DOA: 09/27/2020 PCP: Coral Spikes, DO      Brief Narrative:    Medical records reviewed and are as summarized below:  [name] is a 50 y.o. male with medical history significant for CAD, hypertension, hyperlipidemia, diabetes mellitus, right diabetic foot ulcer.  He presented to the hospital because of pain, swelling and redness of the right foot with increasing drainage from the right foot ulcer.  He reported falling off his truck and sustaining a cut to his right foot after slipping on ice.  This happened a few days prior to admission  He was admitted to the hospital for sepsis secondary to right foot cellulitis with abscess and osteomyelitis.  He was treated with empiric IV antibiotics, IV fluids and analgesics.  He was seen in consultation by the podiatrist.  He underwent partial fifth ray amputation, incision and drainage of the right foot with placement of antibiotic beads on 10/01/2020.  He was also evaluated by the vascular surgeon and he underwent abdominal aortogram and right lower extremity distal runoff fat order catheter placement on 10/03/2020.  No hemodynamically significant stenosis was identified.  He has poorly controlled type 2 diabetes mellitus.  Hemoglobin A1c was 11.2. He was started on Lantus on NovoLog.    Assessment/Plan:   Principal Problem:   Sepsis ([place]) Active Problems:   Coronary artery disease involving native coronary artery of native heart without angina pectoris   Uncontrolled type 2 diabetes mellitus with circulatory disorder, without long-term current use of insulin (HCC)   Essential hypertension   Hyperlipidemia   Cellulitis in diabetic foot (HCC)   Transaminitis   Body mass index is 29.53 kg/m.    Sepsis secondary to right foot cellulitis, abscess and osteomyelitis, right diabetic foot ulcer: s/p partial fifth ray amputation, incision and drainage of the right foot  with placement of antibiotic beads on 10/01/2020.  Continue IV antibiotics.  Podiatrist plans to repeat surgery in about a week or so. Follow-up with ID for further recommendations. S/p abdominal aortogram.  No significant blockage noted.  Type II DM with hyperglycemia: Hemoglobin A1c 11.2.  Continue Lantus and NovoLog.  Monitor glucose levels closely.  Sinus headache/sinusitis: Flonase nasal spray has been prescribed.  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  Other comorbidities include CAD, hypertension, hyperlipidemia  Diet Order            Diet heart healthy/carb modified Room service appropriate? Yes; Fluid consistency: Thin  Diet effective now                    Consultants:  Podiatrist  Vascular surgeon  General surgeon  Procedures:  Partial fifth ray amputation, incision and drainage of multiple locations in the dorsal and plantar right foot, placement of antibiotic beads and placement of wound VAC on 10/01/2020.  Abdominal aortogram, right lower extremity distal runoff third order catheter placement on 10/03/2020    Medications:   . aspirin EC  81 mg Oral Daily  . atorvastatin  40 mg Oral Daily  . carvedilol  3.125 mg Oral BID WC  . chlorhexidine  60 mL Topical Once  . chlorhexidine  60 mL Topical Once  . chlorhexidine  60 mL Topical Once  . fluticasone  2 spray Each Nare Daily  . gabapentin  600 mg Oral  QHS  . heparin  5,000 Units Subcutaneous Q8H  . insulin aspart  0-15 Units Subcutaneous TID WC  . insulin aspart  0-5 Units Subcutaneous QHS  . insulin aspart  8 Units Subcutaneous TID WC  . insulin glargine  30 Units Subcutaneous Daily  . lisinopril  5 mg Oral Daily  . melatonin  5 mg Oral QHS  . povidone-iodine  2 application Topical Once  . povidone-iodine  2 application Topical Once  . sodium chloride  flush  3 mL Intravenous Q12H  . sodium chloride flush  3 mL Intravenous Q12H   Continuous Infusions: . sodium chloride    . ampicillin-sulbactam (UNASYN) IV 3 g (10/08/20 5009)     Anti-infectives (From admission, onward)   Start     Dose/Rate Route Frequency Ordered Stop   10/06/20 0600  ceFAZolin (ANCEF) IVPB 2g/100 mL premix        2 g 200 mL/hr over 30 Minutes Intravenous On call to O.R. 10/05/20 2241 10/07/20 0559   10/03/20 1045  ceFAZolin (ANCEF) IVPB 1 g/50 mL premix  Status:  Discontinued       Note to Pharmacy: To be given in specials   1 g 100 mL/hr over 30 Minutes Intravenous  Once 10/03/20 0956 10/03/20 1001   10/03/20 1045  ceFAZolin (ANCEF) IVPB 2g/100 mL premix       Note to Pharmacy: To be given in specials   2 g 200 mL/hr over 30 Minutes Intravenous  Once 10/03/20 1001 10/03/20 1245   10/02/20 1800  Ampicillin-Sulbactam (UNASYN) 3 g in sodium chloride 0.9 % 100 mL IVPB        3 g 200 mL/hr over 30 Minutes Intravenous Every 6 hours 10/02/20 1628     09/28/20 1800  vancomycin (VANCOREADY) IVPB 1500 mg/300 mL  Status:  Discontinued        1,500 mg 150 mL/hr over 120 Minutes Intravenous Every 12 hours 09/28/20 0915 09/28/20 1553   09/28/20 1800  linezolid (ZYVOX) IVPB 600 mg  Status:  Discontinued        600 mg 300 mL/hr over 60 Minutes Intravenous Every 12 hours 09/28/20 1553 10/02/20 1625   09/28/20 1700  piperacillin-tazobactam (ZOSYN) IVPB 3.375 g  Status:  Discontinued        3.375 g 12.5 mL/hr over 240 Minutes Intravenous Every 8 hours 09/28/20 1553 10/02/20 1624   09/28/20 0600  ceFEPIme (MAXIPIME) 2 g in sodium chloride 0.9 % 100 mL IVPB  Status:  Discontinued        2 g 200 mL/hr over 30 Minutes Intravenous Every 8 hours 09/28/20 0216 09/28/20 1553   09/28/20 0500  vancomycin (VANCOREADY) IVPB 1750 mg/350 mL  Status:  Discontinued        1,750 mg 175 mL/hr over 120 Minutes Intravenous Every 12 hours 09/28/20 0222 09/28/20 0915   09/28/20 0200   metroNIDAZOLE (FLAGYL) IVPB 500 mg  Status:  Discontinued        500 mg 100 mL/hr over 60 Minutes Intravenous Every 8 hours 09/27/20 2320 09/28/20 1552   09/27/20 2130  ceFEPIme (MAXIPIME) 2 g in sodium chloride 0.9 % 100 mL IVPB        2 g 200 mL/hr over 30 Minutes Intravenous  Once 09/27/20 2120 09/27/20 2310   09/27/20 2130  metroNIDAZOLE (FLAGYL) IVPB 500 mg  Status:  Discontinued        500 mg 100 mL/hr over 60 Minutes Intravenous  Once 09/27/20 2120 09/28/20 0122   09/27/20  2130  vancomycin (VANCOCIN) IVPB 1000 mg/200 mL premix        1,000 mg 200 mL/hr over 60 Minutes Intravenous  Once 09/27/20 2120 09/28/20 0106             Family Communication/Anticipated D/C date and plan/Code Status   DVT prophylaxis: heparin injection 5,000 Units Start: 09/27/20 2315     Code Status: Full Code  Family Communication: None Disposition Plan:    Status is: Inpatient  Remains inpatient appropriate because:IV treatments appropriate due to intensity of illness or inability to take PO   Dispo: The patient is from: Home              Anticipated d/c is to: Home              Anticipated d/c date is: > 3 days              Patient currently is not medically stable to d/c.   Difficult to place patient Yes           Subjective:   Interval events noted.  He complains of sinus headache, nasal congestion and pain in the right foot.  Objective:    Vitals:   10/07/20 2048 10/08/20 0022 10/08/20 0451 10/08/20 0744  BP: 121/65 106/69 118/83 112/66  Pulse: 66 68 65 69  Resp: 16 16 16 16   Temp: 97.9 F (36.6 C) 97.9 F (36.6 C) 98 F (36.7 C) 98 F (36.7 C)  TempSrc: Oral Oral Oral   SpO2: 93% 100% 97% 99%  Weight:      Height:       No data found.   Intake/Output Summary (Last 24 hours) at 10/08/2020 1103 Last data filed at 10/08/2020 1009 Gross per 24 hour  Intake 880 ml  Output 3025 ml  Net -2145 ml   Filed Weights   09/27/20 1859 09/29/20 1000  Weight:  104.3 kg 104.3 kg    Exam  GEN: NAD SKIN: Chronic superficial wound on the left lateral ankle.  Dressing on right foot is clean and dry. EYES: No pallor or icterus ENT: MMM CV: RRR PULM: CTA B ABD: soft, ND, NT, +BS CNS: AAO x 3, non focal EXT: No edema or tenderness             Data Reviewed:   I have personally reviewed following labs and imaging studies:  Labs: Labs show the following:   Basic Metabolic Panel: Recent Labs  Lab 10/04/20 0950 10/05/20 0513 10/06/20 0529 10/07/20 0350 10/08/20 0348  NA 134* 134* 134* 132* 132*  K 4.5 4.1 3.8 3.7 3.9  CL 97* 97* 99 98 99  CO2 26 28 26 28 26   GLUCOSE 197* 241* 212* 184* 179*  BUN 12 13 13 14 17   CREATININE 0.94 0.83 0.91 0.80 1.01  CALCIUM 8.4* 8.6* 8.7* 8.3* 8.5*  MG 1.9 1.8 1.8 1.6* 1.7  PHOS 3.5 3.9 3.8 3.8 3.8   GFR Estimated Creatinine Clearance: 112.6 mL/min (by C-G formula based on SCr of 1.01 mg/dL). Liver Function Tests: Recent Labs  Lab 10/04/20 0950 10/05/20 0513 10/06/20 0529 10/07/20 0350 10/08/20 0348  AST 44* 35 32 37 32  ALT 34 28 26 27 25   ALKPHOS 300* 263* 259* 255* 227*  BILITOT 0.7 0.7 0.6 0.8 0.8  PROT 7.7 7.2 7.4 7.5 7.3  ALBUMIN 2.1* 2.0* 2.1* 2.2* 2.2*   No results for input(s): LIPASE, AMYLASE in the last 168 hours. No results for input(s): AMMONIA in the  last 168 hours. Coagulation profile No results for input(s): INR, PROTIME in the last 168 hours.  CBC: Recent Labs  Lab 10/04/20 0950 10/05/20 0513 10/06/20 0529 10/07/20 0350 10/08/20 0348  WBC 11.2* 9.2 11.3* 11.1* 9.3  NEUTROABS 7.6 5.9 7.3 7.0 5.4  HGB 12.6* 12.3* 12.6* 12.6* 12.6*  HCT 37.8* 36.8* 37.1* 37.6* 37.0*  MCV 97.7 96.3 96.4 96.4 95.9  PLT 296 293 315 354 321   Cardiac Enzymes: No results for input(s): CKTOTAL, CKMB, CKMBINDEX, TROPONINI in the last 168 hours. BNP (last 3 results) No results for input(s): PROBNP in the last 8760 hours. CBG: Recent Labs  Lab 10/07/20 0750  10/07/20 1143 10/07/20 1531 10/07/20 2048 10/08/20 0801  GLUCAP 168* 224* 169* 93 162*   D-Dimer: No results for input(s): DDIMER in the last 72 hours. Hgb A1c: No results for input(s): HGBA1C in the last 72 hours. Lipid Profile: No results for input(s): CHOL, HDL, LDLCALC, TRIG, CHOLHDL, LDLDIRECT in the last 72 hours. Thyroid function studies: No results for input(s): TSH, T4TOTAL, T3FREE, THYROIDAB in the last 72 hours.  Invalid input(s): FREET3 Anemia work up: No results for input(s): VITAMINB12, FOLATE, FERRITIN, TIBC, IRON, RETICCTPCT in the last 72 hours. Sepsis Labs: Recent Labs  Lab 10/05/20 0513 10/06/20 0529 10/07/20 0350 10/08/20 0348  WBC 9.2 11.3* 11.1* 9.3    Microbiology Recent Results (from the past 240 hour(s))  Aerobic/Anaerobic Culture (surgical/deep wound)     Status: None   Collection Time: 09/29/20 10:49 AM   Specimen: Wound  Result Value Ref Range Status   Specimen Description   Final    WOUND Performed at [facility], [address] [place], [place] [address]    Special Requests   Final    right foot culture Performed at [facility], [place], [place] [address]    Gram Stain   Final    RARE WBC PRESENT,BOTH PMN AND MONONUCLEAR RARE GRAM POSITIVE COCCI IN PAIRS    Culture   Final    RARE GROUP B STREP(S.AGALACTIAE)ISOLATED TESTING AGAINST S. AGALACTIAE NOT ROUTINELY PERFORMED DUE TO PREDICTABILITY OF AMP/PEN/VAN SUSCEPTIBILITY. NO ANAEROBES ISOLATED Performed at [facility], [place] [address], [place], [place] [address]    Report Status 10/04/2020 FINAL  Final    Procedures and diagnostic studies:  No results found.             LOS: 10 days   [name]  [facility]   Pager on [email or web address]. If 7PM-7AM, please contact night-coverage at [email or web address]     10/08/2020, 11:03 AM

## 2020-10-09 ENCOUNTER — Inpatient Hospital Stay: Payer: Self-pay

## 2020-10-09 DIAGNOSIS — L0889 Other specified local infections of the skin and subcutaneous tissue: Secondary | ICD-10-CM

## 2020-10-09 LAB — COMPREHENSIVE METABOLIC PANEL
ALT: 26 U/L (ref 0–44)
AST: 35 U/L (ref 15–41)
Albumin: 2.4 g/dL — ABNORMAL LOW (ref 3.5–5.0)
Alkaline Phosphatase: 235 U/L — ABNORMAL HIGH (ref 38–126)
Anion gap: 8 (ref 5–15)
BUN: 19 mg/dL (ref 6–20)
CO2: 28 mmol/L (ref 22–32)
Calcium: 9 mg/dL (ref 8.9–10.3)
Chloride: 98 mmol/L (ref 98–111)
Creatinine, Ser: 0.98 mg/dL (ref 0.61–1.24)
GFR, Estimated: >60 mL/min (ref >60)
Glucose, Bld: 187 mg/dL — ABNORMAL HIGH (ref 70–99)
Potassium: 4.2 mmol/L (ref 3.5–5.1)
Sodium: 134 mmol/L — ABNORMAL LOW (ref 135–145)
Total Bilirubin: 0.6 mg/dL (ref 0.3–1.2)
Total Protein: 8 g/dL (ref 6.5–8.1)

## 2020-10-09 LAB — CBC WITH DIFFERENTIAL/PLATELET
Abs Immature Granulocytes: 0.18 10*3/uL — ABNORMAL HIGH (ref 0.00–0.07)
Basophils Absolute: 0.1 10*3/uL (ref 0.0–0.1)
Basophils Relative: 1 %
Eosinophils Absolute: 0.2 10*3/uL (ref 0.0–0.5)
Eosinophils Relative: 2 %
HCT: 38.7 % — ABNORMAL LOW (ref 39.0–52.0)
Hemoglobin: 12.8 g/dL — ABNORMAL LOW (ref 13.0–17.0)
Immature Granulocytes: 2 %
Lymphocytes Relative: 32 %
Lymphs Abs: 3.7 10*3/uL (ref 0.7–4.0)
MCH: 32 pg (ref 26.0–34.0)
MCHC: 33.1 g/dL (ref 30.0–36.0)
MCV: 96.8 fL (ref 80.0–100.0)
Monocytes Absolute: 0.7 10*3/uL (ref 0.1–1.0)
Monocytes Relative: 6 %
Neutro Abs: 6.6 10*3/uL (ref 1.7–7.7)
Neutrophils Relative %: 57 %
Platelets: 381 10*3/uL (ref 150–400)
RBC: 4 MIL/uL — ABNORMAL LOW (ref 4.22–5.81)
RDW: 12.3 % (ref 11.5–15.5)
WBC: 11.4 10*3/uL — ABNORMAL HIGH (ref 4.0–10.5)
nRBC: 0 % (ref 0.0–0.2)

## 2020-10-09 LAB — MAGNESIUM: Magnesium: 1.8 mg/dL (ref 1.7–2.4)

## 2020-10-09 LAB — GLUCOSE, CAPILLARY
Glucose-Capillary: 176 mg/dL — ABNORMAL HIGH (ref 70–99)
Glucose-Capillary: 181 mg/dL — ABNORMAL HIGH (ref 70–99)
Glucose-Capillary: 166 mg/dL — ABNORMAL HIGH (ref 70–99)
Glucose-Capillary: 177 mg/dL — ABNORMAL HIGH (ref 70–99)

## 2020-10-09 LAB — PHOSPHORUS: Phosphorus: 3.6 mg/dL (ref 2.5–4.6)

## 2020-10-09 NOTE — Progress Notes (Signed)
   Date of Admission:  09/27/2020     ID: [name] is a 50 y.o. male Principal Problem:   Sepsis ([place]) Active Problems:   Coronary artery disease involving native coronary artery of native heart without angina pectoris   Uncontrolled type 2 diabetes mellitus with circulatory disorder, without long-term current use of insulin (HCC)   Essential hypertension   Hyperlipidemia   Cellulitis in diabetic foot ([place])   Transaminitis    Subjective: States he is feeling okay No specific complaints  Medications:  . aspirin EC  81 mg Oral Daily  . atorvastatin  40 mg Oral Daily  . carvedilol  3.125 mg Oral BID WC  . chlorhexidine  60 mL Topical Once  . fluticasone  2 spray Each Nare Daily  . gabapentin  600 mg Oral QHS  . heparin  5,000 Units Subcutaneous Q8H  . insulin aspart  0-15 Units Subcutaneous TID WC  . insulin aspart  0-5 Units Subcutaneous QHS  . insulin aspart  8 Units Subcutaneous TID WC  . insulin glargine  30 Units Subcutaneous Daily  . lisinopril  5 mg Oral Daily  . melatonin  5 mg Oral QHS  . povidone-iodine  2 application Topical Once  . povidone-iodine  2 application Topical Once  . sodium chloride flush  3 mL Intravenous Q12H    Objective: Vital signs in last 24 hours: Temp:  [97.6 F (36.4 C)-98.7 F (37.1 C)] 98.2 F (36.8 C) (02/21 1226) Pulse Rate:  [72-78] 78 (02/21 1226) Resp:  [14-18] 14 (02/21 1226) BP: (110-136)/(62-82) 110/71 (02/21 1226) SpO2:  [98 %-100 %] 98 % (02/21 1226)  PHYSICAL EXAM:  General: Alert, cooperative, no distress, flat affect  Lungs: Bilateral air entry Heart: Regular rate and rhythm, no murmur, rub or gallop. Abdomen: Soft,  Extremities:  Right foot     Skin: No rashes or lesions. Or bruising Lymph: Cervical, supraclavicular normal. Neurologic: Grossly non-focal  Lab Results Recent Labs    10/08/20 0348 10/09/20 0436  WBC 9.3 11.4*  HGB 12.6* 12.8*  HCT 37.0* 38.7*  NA 132* 134*  K 3.9 4.2  CL 99 98   CO2 26 28  BUN 17 19  CREATININE 1.01 0.98   Liver Panel Recent Labs    10/08/20 0348 10/09/20 0436  PROT 7.3 8.0  ALBUMIN 2.2* 2.4*  AST 32 35  ALT 25 26  ALKPHOS 227* 235*  BILITOT 0.8 0.6   Microbiology: Group B streptococcus wound culture    Assessment/Plan:  Diabetic foot infection with severe necrotizing wound on the right foot.  Underwent incision and drainage and then fifth ray amputation.  The wound is finally demarcating and the necrosis is stable.  He very likely will be taken for TMA on 10/11/2020. Wound culture was positive for group B streptococcus Patient is currently on Unasyn. He will need a long course of IV antibiotic.  That depends on how the wound progresses. Does not have insurance and so on discharge will have to make arrangements for IV antibiotics.  Diabetes mellitus poorly controlled on admission.  Currently on insulin  Discussed the management with the patient.

## 2020-10-09 NOTE — Progress Notes (Signed)
Physical Therapy Treatment Patient Details Name: [name] MRN: [id number] DOB: [birth year] Today's Date: 10/09/2020    History of Present Illness [name] is a 50 y.o. male is status post right partial fifth ray amputation with incision and drainage with application of antibiotic beads and wound VAC.  Patient has remained nonweightbearing since procedure.  He presents with R foot infection; cellulitis in diabetic foot.    PT Comments    Therapist in several times to see pt today. Increased time spent locating off-loading shoe for R foot.  Educated pt on proper use for safe mobility. Reviewed B LE therex program. Pt is currently independent with bed mobility.  Unable to raise to standing from bed without support of RW and raised bed.  Pt completed short distance ambulation of 2 ft while maintaining PWB through R heel only.  Pt left sitting up in chair with LE's elevated, call bell in reach, and all needs met.  Pt awaiting right transmetatarsal amputation with tendo Achilles lengthening late afternoon on 10/11/2020.     Follow Up Recommendations  Home health PT;Supervision - Intermittent;Supervision for mobility/OOB     Equipment Recommendations  Rolling walker with 5" wheels;3in1 (PT)    Recommendations for Other Services       Precautions / Restrictions Precautions Precautions: Fall Restrictions Weight Bearing Restrictions: Yes RLE Weight Bearing: Non weight bearing RLE Partial Weight Bearing Percentage or Pounds: R LE Other Position/Activity Restrictions: PWB heel only with off-loading shoe on    Mobility  Bed Mobility                    Transfers Overall transfer level: Needs assistance Equipment used: Rolling walker (2 wheeled) Transfers: Sit to/from Stand Sit to Stand: Supervision         General transfer comment: Pt unable to stand from bed without support of RW  Ambulation/Gait Ambulation/Gait assistance: Supervision Gait Distance (Feet): 3  Feet Assistive device: Rolling walker (2 wheeled)   Gait velocity: decreased   General Gait Details: Pt with increased steadiness with donning of R off-loading shoe in standing.   Stairs             Wheelchair Mobility    Modified Rankin (Stroke Patients Only)       Balance                                            Cognition Arousal/Alertness: Awake/alert Behavior During Therapy: Flat affect;WFL for tasks assessed/performed Overall Cognitive Status: Within Functional Limits for tasks assessed                                 General Comments:  (Increased time spent locating off-loading shoe. Completed training with pt on proper use in order to maintain weight bearing restriction compliance.)      Exercises      General Comments        Pertinent Vitals/Pain Pain Assessment: No/denies pain    Home Living                      Prior Function            PT Goals (current goals can now be found in the care plan section) Acute Rehab PT Goals Patient Stated Goal: to get home  Frequency    7X/week      PT Plan      Co-evaluation              AM-PAC PT "6 Clicks" Mobility   Outcome Measure  Help needed turning from your back to your side while in a flat bed without using bedrails?: None Help needed moving from lying on your back to sitting on the side of a flat bed without using bedrails?: None Help needed moving to and from a bed to a chair (including a wheelchair)?: A Little Help needed standing up from a chair using your arms (e.g., wheelchair or bedside chair)?: A Little Help needed to walk in hospital room?: A Little Help needed climbing 3-5 steps with a railing? : A Little 6 Click Score: 20    End of Session Equipment Utilized During Treatment: Gait belt Activity Tolerance: Patient limited by fatigue;No increased pain Patient left: in chair;with call bell/phone within reach Nurse  Communication: Mobility status PT Visit Diagnosis: Unsteadiness on feet (R26.81);Difficulty in walking, not elsewhere classified (R26.2);Muscle weakness (generalized) (M62.81)     Time: 1405-1500 PT Time Calculation (min) (ACUTE ONLY): 55 min  Charges:  $Therapeutic Exercise: 8-22 mins $Therapeutic Activity: 38-52 mins                     [name], PTA    [name] 10/09/2020, 3:26 PM

## 2020-10-09 NOTE — Progress Notes (Addendum)
PODIATRY / FOOT AND ANKLE SURGERY PROGRESS NOTE  Reason for consult: Right foot infection    HPI: [name] is a 50 y.o. male who presents status post right partial fifth ray amputation with incision and drainage with application of antibiotic beads resting in bed comfortably. Patient still complains of pain at the level of the amputation site. Patient notes improvements in redness and swelling overall and is feeling better. Patient is still maintain for the most part nonweightbearing on the right side has been performing heel contact transfers.  PMHx:  Past Medical History:  Diagnosis Date  . Diabetes mellitus without complication ([place])   . Heart disease   . Myocardial infarction [facility])     Surgical Hx:  Past Surgical History:  Procedure Laterality Date  . AMPUTATION Right 10/01/2020   Procedure: AMPUTATION 5th  RAY AND IRRIGATION AND DEBRIDEMENT;  Surgeon: Samara Deist, DPM;  Location: [facility] ORS;  Service: Podiatry;  Laterality: Right;  . BACK SURGERY    . INCISION AND DRAINAGE Right 09/29/2020   Procedure: INCISION AND DRAINAGE, RIGHT FOOT;  Surgeon: Samara Deist, DPM;  Location: [facility] ORS;  Service: Podiatry;  Laterality: Right;  . LOWER EXTREMITY ANGIOGRAPHY Right 10/03/2020   Procedure: Lower Extremity Angiography;  Surgeon: Katha Cabal, MD;  Location: [facility];  Service: Cardiovascular;  Laterality: Right;    FHx:  Family History  Problem Relation Age of Onset  . Diabetes Father   . Heart disease Father   . Breast cancer Mother   . Lung cancer Paternal Grandmother     Social History:  reports that he has been smoking cigarettes. He has been smoking about 1.00 pack per day. He has never used smokeless tobacco. He reports current alcohol use. He reports that he does not use drugs.  Allergies: No Known Allergies  Medications Prior to Admission  Medication Sig Dispense Refill  . atorvastatin (LIPITOR) 40 MG tablet Take 1 tablet (40 mg total) by  mouth daily. 90 tablet 3  . Blood Glucose Monitoring Suppl (RELION PRIME MONITOR) DEVI Use up to 4 times daily to check blood sugars. 1 Device 0  . clopidogrel (PLAVIX) 75 MG tablet TAKE ONE TABLET BY MOUTH ONCE DAILY (Patient not taking: Reported on 09/28/2020) 30 tablet 0  . empagliflozin (JARDIANCE) 10 MG TABS tablet Take 10 mg by mouth daily. (Patient not taking: Reported on 09/28/2020) 90 tablet 0  . gabapentin (NEURONTIN) 300 MG capsule Take 600 mg by mouth at bedtime as needed.    Marland Kitchen glipiZIDE (GLUCOTROL XL) 10 MG 24 hr tablet Take 10 mg by mouth daily.    Marland Kitchen glucose blood (RELION PRIME TEST) test strip Use as instructed 100 each 12  . lisinopril (PRINIVIL,ZESTRIL) 5 MG tablet TAKE ONE TABLET BY MOUTH ONCE DAILY 30 tablet 0  . meloxicam (MOBIC) 7.5 MG tablet Take 7.5 mg by mouth 2 (two) times daily.    . metFORMIN (GLUCOPHAGE) 500 MG tablet TAKE ONE TABLET BY MOUTH TWICE DAILY WITH A MEAL 30 tablet 0  . ReliOn Ultra Thin Lancets MISC Use as directed to take blood sugar. 200 each 0  . sildenafil (VIAGRA) 25 MG tablet Take 1 tablet by mouth daily.    Marland Kitchen sulfamethoxazole-trimethoprim (BACTRIM DS,SEPTRA DS) 800-160 MG tablet Take 1 tablet by mouth 2 (two) times daily. (Patient not taking: Reported on 09/28/2020) 20 tablet 0    Physical Exam: General: Alert and oriented.  No apparent distress.  Vascular: DP/PT pulses faintly palpable bilateral, no hair  growth noted to digits bilateral.  Capillary fill time appears to be intact to both feet.  Neuro: Light touch sensation reduced to bilateral lower extremities.   Derm: Wound beds appear to be stable at this time to the plantar foot and lateral foot right.  Patient appears to have bone exposed at the level of the operative site.  Erythema and edema appear to be greatly improved overall.  Concern as patient appears to have some dusky changes over the dorsal lateral aspect of the right foot with now enlarged area of necrosis. No purulence able to be  expressed, no fluctuance.  Plantar incision line appears to be stable at this time but does appear to have macerated margins around the entirety of the incision and wound.       MSK: Right partial fifth ray amputation.  Limited ankle joint dorsiflexion noted with the knee extended but mildly improved with knee flexion bilateral.  Results for orders placed or performed during the hospital encounter of 09/27/20 (from the past 48 hour(s))  Glucose, capillary     Status: Abnormal   Collection Time: 10/07/20  3:31 PM  Result Value Ref Range   Glucose-Capillary 169 (H) 70 - 99 mg/dL    Comment: Glucose reference range applies only to samples taken after fasting for at least 8 hours.  Glucose, capillary     Status: None   Collection Time: 10/07/20  8:48 PM  Result Value Ref Range   Glucose-Capillary 93 70 - 99 mg/dL    Comment: Glucose reference range applies only to samples taken after fasting for at least 8 hours.  Comprehensive metabolic panel     Status: Abnormal   Collection Time: 10/08/20  3:48 AM  Result Value Ref Range   Sodium 132 (L) 135 - 145 mmol/L   Potassium 3.9 3.5 - 5.1 mmol/L   Chloride 99 98 - 111 mmol/L   CO2 26 22 - 32 mmol/L   Glucose, Bld 179 (H) 70 - 99 mg/dL    Comment: Glucose reference range applies only to samples taken after fasting for at least 8 hours.   BUN 17 6 - 20 mg/dL   Creatinine, Ser 5.63 0.61 - 1.24 mg/dL   Calcium 8.5 (L) 8.9 - 10.3 mg/dL   Total Protein 7.3 6.5 - 8.1 g/dL   Albumin 2.2 (L) 3.5 - 5.0 g/dL   AST 32 15 - 41 U/L   ALT 25 0 - 44 U/L   Alkaline Phosphatase 227 (H) 38 - 126 U/L   Total Bilirubin 0.8 0.3 - 1.2 mg/dL   GFR, Estimated >14 >97 mL/min    Comment: (NOTE) Calculated using the CKD-EPI Creatinine Equation (2021)    Anion gap 7 5 - 15    Comment: Performed at [facility], [address], [place], [place] [address]  Magnesium     Status: None   Collection Time: 10/08/20  3:48 AM  Result Value Ref Range    Magnesium 1.7 1.7 - 2.4 mg/dL    Comment: Performed at [facility], [address] [place], [place] [address]  Phosphorus     Status: None   Collection Time: 10/08/20  3:48 AM  Result Value Ref Range   Phosphorus 3.8 2.5 - 4.6 mg/dL    Comment: Performed at [facility], [address], [place], [place] [address]  CBC with Differential/Platelet     Status: Abnormal   Collection Time: 10/08/20  3:48 AM  Result Value Ref Range  WBC 9.3 4.0 - 10.5 K/uL   RBC 3.86 (L) 4.22 - 5.81 MIL/uL   Hemoglobin 12.6 (L) 13.0 - 17.0 g/dL   HCT 41.7 (L) 40.8 - 14.4 %   MCV 95.9 80.0 - 100.0 fL   MCH 32.6 26.0 - 34.0 pg   MCHC 34.1 30.0 - 36.0 g/dL   RDW 81.8 56.3 - 14.9 %   Platelets 321 150 - 400 K/uL   nRBC 0.0 0.0 - 0.2 %   Neutrophils Relative % 57 %   Neutro Abs 5.4 1.7 - 7.7 K/uL   Lymphocytes Relative 32 %   Lymphs Abs 2.9 0.7 - 4.0 K/uL   Monocytes Relative 6 %   Monocytes Absolute 0.5 0.1 - 1.0 K/uL   Eosinophils Relative 2 %   Eosinophils Absolute 0.2 0.0 - 0.5 K/uL   Basophils Relative 1 %   Basophils Absolute 0.1 0.0 - 0.1 K/uL   Immature Granulocytes 2 %   Abs Immature Granulocytes 0.18 (H) 0.00 - 0.07 K/uL    Comment: Performed at [facility], [address] [place], [place] [address]  Glucose, capillary     Status: Abnormal   Collection Time: 10/08/20  8:01 AM  Result Value Ref Range   Glucose-Capillary 162 (H) 70 - 99 mg/dL    Comment: Glucose reference range applies only to samples taken after fasting for at least 8 hours.  Glucose, capillary     Status: Abnormal   Collection Time: 10/08/20 12:03 PM  Result Value Ref Range   Glucose-Capillary 163 (H) 70 - 99 mg/dL    Comment: Glucose reference range applies only to samples taken after fasting for at least 8 hours.  Glucose, capillary     Status: Abnormal   Collection Time: 10/08/20  5:15 PM  Result Value Ref Range   Glucose-Capillary 174 (H) 70 - 99 mg/dL    Comment: Glucose reference  range applies only to samples taken after fasting for at least 8 hours.  Glucose, capillary     Status: Abnormal   Collection Time: 10/08/20  9:14 PM  Result Value Ref Range   Glucose-Capillary 137 (H) 70 - 99 mg/dL    Comment: Glucose reference range applies only to samples taken after fasting for at least 8 hours.  Comprehensive metabolic panel     Status: Abnormal   Collection Time: 10/09/20  4:36 AM  Result Value Ref Range   Sodium 134 (L) 135 - 145 mmol/L   Potassium 4.2 3.5 - 5.1 mmol/L   Chloride 98 98 - 111 mmol/L   CO2 28 22 - 32 mmol/L   Glucose, Bld 187 (H) 70 - 99 mg/dL    Comment: Glucose reference range applies only to samples taken after fasting for at least 8 hours.   BUN 19 6 - 20 mg/dL   Creatinine, Ser 7.85 0.61 - 1.24 mg/dL   Calcium 9.0 8.9 - 88.5 mg/dL   Total Protein 8.0 6.5 - 8.1 g/dL   Albumin 2.4 (L) 3.5 - 5.0 g/dL   AST 35 15 - 41 U/L   ALT 26 0 - 44 U/L   Alkaline Phosphatase 235 (H) 38 - 126 U/L   Total Bilirubin 0.6 0.3 - 1.2 mg/dL   GFR, Estimated >02 >77 mL/min    Comment: (NOTE) Calculated using the CKD-EPI Creatinine Equation (2021)    Anion gap 8 5 - 15    Comment: Performed at [facility], [address] [place], [place] [address]  Magnesium  Status: None   Collection Time: 10/09/20  4:36 AM  Result Value Ref Range   Magnesium 1.8 1.7 - 2.4 mg/dL    Comment: Performed at [facility], [address] [place], [place] [address]  Phosphorus     Status: None   Collection Time: 10/09/20  4:36 AM  Result Value Ref Range   Phosphorus 3.6 2.5 - 4.6 mg/dL    Comment: Performed at [facility], [address] [place], [place] [address]  CBC with Differential/Platelet     Status: Abnormal   Collection Time: 10/09/20  4:36 AM  Result Value Ref Range   WBC 11.4 (H) 4.0 - 10.5 K/uL   RBC 4.00 (L) 4.22 - 5.81 MIL/uL   Hemoglobin 12.8 (L) 13.0 - 17.0 g/dL   HCT 42.5 (L) 95.6 - 38.7 %   MCV 96.8 80.0 - 100.0 fL    MCH 32.0 26.0 - 34.0 pg   MCHC 33.1 30.0 - 36.0 g/dL   RDW 56.4 33.2 - 95.1 %   Platelets 381 150 - 400 K/uL   nRBC 0.0 0.0 - 0.2 %   Neutrophils Relative % 57 %   Neutro Abs 6.6 1.7 - 7.7 K/uL   Lymphocytes Relative 32 %   Lymphs Abs 3.7 0.7 - 4.0 K/uL   Monocytes Relative 6 %   Monocytes Absolute 0.7 0.1 - 1.0 K/uL   Eosinophils Relative 2 %   Eosinophils Absolute 0.2 0.0 - 0.5 K/uL   Basophils Relative 1 %   Basophils Absolute 0.1 0.0 - 0.1 K/uL   Immature Granulocytes 2 %   Abs Immature Granulocytes 0.18 (H) 0.00 - 0.07 K/uL    Comment: Performed at [facility], [address] [place], [place] [address]  Glucose, capillary     Status: Abnormal   Collection Time: 10/09/20  8:20 AM  Result Value Ref Range   Glucose-Capillary 176 (H) 70 - 99 mg/dL    Comment: Glucose reference range applies only to samples taken after fasting for at least 8 hours.  Glucose, capillary     Status: Abnormal   Collection Time: 10/09/20 12:20 PM  Result Value Ref Range   Glucose-Capillary 181 (H) 70 - 99 mg/dL    Comment: Glucose reference range applies only to samples taken after fasting for at least 8 hours.   No results found.  Blood pressure 110/71, pulse 78, temperature 98.2 F (36.8 C), resp. rate 14, height 6\' 2"  (1.88 m), weight 104.3 kg, SpO2 98 %.  Assessment 1. 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 Diabetes type 2 polyneuropathy 3. PVD 4. History of tobacco abuse/smoking 5. Equinus  Plan -Patient seen and examined. -Area of necrosis appears to be demarcating further to the dorsal and plantar skin flaps with mild maceration present. Overall appears to be stable with no worsening signs of infection. -Redressed today with Betadine to macerated areas followed by Xeroform to the wound bed, 4 x 4 gauze, ABD, Kerlix, Ace wrap. Patient to continue with daily dressing changes  per orders. -Patient can be partial weightbearing with heel contact in a surgical shoe for transfers but otherwise needs to stay off of the right foot. -Dressing changes ordered.  Recommend Betadine wet-to-dry dressings daily as per orders. -Appreciate vascular recommendations. -Appreciate antibiotic therapy recommendations per medicine.  Previous culture growing group B strep.  Appreciate infectious disease recommendations. Patient likely needs IV antibiotics for 2 to 6  weeks. -Discussed continued conservative care versus surgical attempts at correction.  Patient likely need local wound care for a number of months to potentially even years in the area still may not close with local wound care itself wound VAC therapy.  Patient does not have insurance which also complicates his care yet further. -Discussed all treatment options with patient both conservative and surgical attempts at correction including potential risks and complications and at this time patient is elected for procedure consisting of right transmetatarsal amputation with tendo Achilles lengthening.  Discussed that patient has large skin void that may make closure difficult as well as some of the dusky necrosis seen at the dorsal lateral aspect of the foot.  Discussed that we will do our best to try to close all areas but he may still have open wound that might require local wound care for a number of months to heal.  Patient understands and would like to proceed.  No guarantees given.  Postoperative course discussed with patient detail but depends on intraoperative findings and ability to close amputation site. -Plan for surgery on 10/11/2020 at around 4 PM.  Patient to be n.p.o. at 8 AM that day prior to surgery.  Hold aspirin prior to procedure and heparin 6 to 8 hours prior to procedure.  Do not restart until 24 hours after procedure.  Rosetta Posner, DPM 10/09/2020, 12:54 PM

## 2020-10-09 NOTE — Progress Notes (Signed)
Progress Note    [name]  HQP:[id number] DOB: 10-28-1970  DOA: 09/27/2020 PCP: Coral Spikes, DO      Brief Narrative:    Medical records reviewed and are as summarized below:  [name] is a 50 y.o. male with medical history significant for CAD, hypertension, hyperlipidemia, diabetes mellitus, right diabetic foot ulcer.  He presented to the hospital because of pain, swelling and redness of the right foot with increasing drainage from the right foot ulcer.  He reported falling off his truck and sustaining a cut to his right foot after slipping on ice.  This happened a few days prior to admission  He was admitted to the hospital for sepsis secondary to right foot cellulitis with abscess and osteomyelitis.  He was treated with empiric IV antibiotics, IV fluids and analgesics.  He was seen in consultation by the podiatrist.  He underwent partial fifth ray amputation, incision and drainage of the right foot with placement of antibiotic beads on 10/01/2020.  He was also evaluated by the vascular surgeon and he underwent abdominal aortogram and right lower extremity distal runoff fat order catheter placement on 10/03/2020.  No hemodynamically significant stenosis was identified.  He has poorly controlled type 2 diabetes mellitus.  Hemoglobin A1c was 11.2. He was started on Lantus on NovoLog.    Assessment/Plan:   Principal Problem:   Sepsis ([place]) Active Problems:   Coronary artery disease involving native coronary artery of native heart without angina pectoris   Uncontrolled type 2 diabetes mellitus with circulatory disorder, without long-term current use of insulin (HCC)   Essential hypertension   Hyperlipidemia   Cellulitis in diabetic foot (HCC)   Transaminitis   Body mass index is 29.53 kg/m.    Sepsis secondary to right foot cellulitis, abscess and osteomyelitis, right diabetic foot ulcer: s/p partial fifth ray amputation, incision and drainage of the right foot  with placement of antibiotic beads on 10/01/2020.  S/p abdominal aortogram no significant blockage was noted.  Continue IV Unasyn.  Podiatrist plans to repeat surgery in about a week or so. Follow-up with ID for further recommendations.   Type II DM with hyperglycemia: Hemoglobin A1c 11.2.  Continue Lantus and NovoLog.  Monitor glucose levels closely.  Sinus headache/sinusitis: Improved.  Continue Flonase nasal spray.  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  Other comorbidities include CAD, hypertension, hyperlipidemia  He has no insurance so he will probably complete course of antibiotics in the hospital.  Diet Order            Diet heart healthy/carb modified Room service appropriate? Yes; Fluid consistency: Thin  Diet effective now                    Consultants:  Podiatrist  Vascular surgeon  General surgeon  Procedures:  Partial fifth ray amputation, incision and drainage of multiple locations in the dorsal and plantar right foot, placement of antibiotic beads and placement of wound VAC on 10/01/2020.  Abdominal aortogram, right lower extremity distal runoff third order catheter placement on 10/03/2020    Medications:   . aspirin EC  81 mg Oral Daily  . atorvastatin  40 mg Oral Daily  . carvedilol  3.125 mg Oral BID WC  . chlorhexidine  60 mL Topical Once  . fluticasone  2 spray Each Nare Daily  . gabapentin  600  mg Oral QHS  . heparin  5,000 Units Subcutaneous Q8H  . insulin aspart  0-15 Units Subcutaneous TID WC  . insulin aspart  0-5 Units Subcutaneous QHS  . insulin aspart  8 Units Subcutaneous TID WC  . insulin glargine  30 Units Subcutaneous Daily  . lisinopril  5 mg Oral Daily  . melatonin  5 mg Oral QHS  . povidone-iodine  2 application Topical Once  . povidone-iodine  2 application Topical Once  .  sodium chloride flush  3 mL Intravenous Q12H   Continuous Infusions: . sodium chloride    . ampicillin-sulbactam (UNASYN) IV 3 g (10/09/20 1856)     Anti-infectives (From admission, onward)   Start     Dose/Rate Route Frequency Ordered Stop   10/06/20 0600  ceFAZolin (ANCEF) IVPB 2g/100 mL premix        2 g 200 mL/hr over 30 Minutes Intravenous On call to O.R. 10/05/20 2241 10/07/20 0559   10/03/20 1045  ceFAZolin (ANCEF) IVPB 1 g/50 mL premix  Status:  Discontinued       Note to Pharmacy: To be given in specials   1 g 100 mL/hr over 30 Minutes Intravenous  Once 10/03/20 0956 10/03/20 1001   10/03/20 1045  ceFAZolin (ANCEF) IVPB 2g/100 mL premix       Note to Pharmacy: To be given in specials   2 g 200 mL/hr over 30 Minutes Intravenous  Once 10/03/20 1001 10/03/20 1245   10/02/20 1800  Ampicillin-Sulbactam (UNASYN) 3 g in sodium chloride 0.9 % 100 mL IVPB        3 g 200 mL/hr over 30 Minutes Intravenous Every 6 hours 10/02/20 1628     09/28/20 1800  vancomycin (VANCOREADY) IVPB 1500 mg/300 mL  Status:  Discontinued        1,500 mg 150 mL/hr over 120 Minutes Intravenous Every 12 hours 09/28/20 0915 09/28/20 1553   09/28/20 1800  linezolid (ZYVOX) IVPB 600 mg  Status:  Discontinued        600 mg 300 mL/hr over 60 Minutes Intravenous Every 12 hours 09/28/20 1553 10/02/20 1625   09/28/20 1700  piperacillin-tazobactam (ZOSYN) IVPB 3.375 g  Status:  Discontinued        3.375 g 12.5 mL/hr over 240 Minutes Intravenous Every 8 hours 09/28/20 1553 10/02/20 1624   09/28/20 0600  ceFEPIme (MAXIPIME) 2 g in sodium chloride 0.9 % 100 mL IVPB  Status:  Discontinued        2 g 200 mL/hr over 30 Minutes Intravenous Every 8 hours 09/28/20 0216 09/28/20 1553   09/28/20 0500  vancomycin (VANCOREADY) IVPB 1750 mg/350 mL  Status:  Discontinued        1,750 mg 175 mL/hr over 120 Minutes Intravenous Every 12 hours 09/28/20 0222 09/28/20 0915   09/28/20 0200  metroNIDAZOLE (FLAGYL) IVPB 500 mg  Status:   Discontinued        500 mg 100 mL/hr over 60 Minutes Intravenous Every 8 hours 09/27/20 2320 09/28/20 1552   09/27/20 2130  ceFEPIme (MAXIPIME) 2 g in sodium chloride 0.9 % 100 mL IVPB        2 g 200 mL/hr over 30 Minutes Intravenous  Once 09/27/20 2120 09/27/20 2310   09/27/20 2130  metroNIDAZOLE (FLAGYL) IVPB 500 mg  Status:  Discontinued        500 mg 100 mL/hr over 60 Minutes Intravenous  Once 09/27/20 2120 09/28/20 0122   09/27/20 2130  vancomycin (VANCOCIN) IVPB 1000 mg/200 mL  premix        1,000 mg 200 mL/hr over 60 Minutes Intravenous  Once 09/27/20 2120 09/28/20 0106             Family Communication/Anticipated D/C date and plan/Code Status   DVT prophylaxis: heparin injection 5,000 Units Start: 09/27/20 2315     Code Status: Full Code  Family Communication: None Disposition Plan:    Status is: Inpatient  Remains inpatient appropriate because:IV treatments appropriate due to intensity of illness or inability to take PO   Dispo: The patient is from: Home              Anticipated d/c is to: Home              Anticipated d/c date is: > 3 days              Patient currently is not medically stable to d/c.   Difficult to place patient Yes           Subjective:   C/o pain in the right foot.  Sinus headache is better.  Objective:    Vitals:   10/08/20 1938 10/08/20 2357 10/09/20 0429 10/09/20 0803  BP: 136/80 126/70 116/62 120/76  Pulse: 75 72 73 74  Resp: 14 16 18 16   Temp: 98.2 F (36.8 C) 98.2 F (36.8 C) 97.6 F (36.4 C) 98.7 F (37.1 C)  TempSrc:      SpO2: 100% 99% 98% 98%  Weight:      Height:       No data found.   Intake/Output Summary (Last 24 hours) at 10/09/2020 1157 Last data filed at 10/09/2020 0900 Gross per 24 hour  Intake --  Output 3125 ml  Net -3125 ml   Filed Weights   09/27/20 1859 09/29/20 1000  Weight: 104.3 kg 104.3 kg    Exam  GEN: NAD SKIN: Warm and dry EYES: EOMI ENT: MMM CV: RRR PULM: CTA  B ABD: soft, ND, NT, +BS CNS: AAO x 3, non focal EXT:.  Chronic superficial wound on the left lateral leg.  Dressing on right foot is clean, dry and intact.              Data Reviewed:   I have personally reviewed following labs and imaging studies:  Labs: Labs show the following:   Basic Metabolic Panel: Recent Labs  Lab 10/05/20 0513 10/06/20 0529 10/07/20 0350 10/08/20 0348 10/09/20 0436  NA 134* 134* 132* 132* 134*  K 4.1 3.8 3.7 3.9 4.2  CL 97* 99 98 99 98  CO2 28 26 28 26 28   GLUCOSE 241* 212* 184* 179* 187*  BUN 13 13 14 17 19   CREATININE 0.83 0.91 0.80 1.01 0.98  CALCIUM 8.6* 8.7* 8.3* 8.5* 9.0  MG 1.8 1.8 1.6* 1.7 1.8  PHOS 3.9 3.8 3.8 3.8 3.6   GFR Estimated Creatinine Clearance: 116.1 mL/min (by C-G formula based on SCr of 0.98 mg/dL). Liver Function Tests: Recent Labs  Lab 10/05/20 0513 10/06/20 0529 10/07/20 0350 10/08/20 0348 10/09/20 0436  AST 35 32 37 32 35  ALT 28 26 27 25 26   ALKPHOS 263* 259* 255* 227* 235*  BILITOT 0.7 0.6 0.8 0.8 0.6  PROT 7.2 7.4 7.5 7.3 8.0  ALBUMIN 2.0* 2.1* 2.2* 2.2* 2.4*   No results for input(s): LIPASE, AMYLASE in the last 168 hours. No results for input(s): AMMONIA in the last 168 hours. Coagulation profile No results for input(s): INR, PROTIME in the last 168 hours.  CBC: Recent Labs  Lab 10/05/20 0513 10/06/20 0529 10/07/20 0350 10/08/20 0348 10/09/20 0436  WBC 9.2 11.3* 11.1* 9.3 11.4*  NEUTROABS 5.9 7.3 7.0 5.4 6.6  HGB 12.3* 12.6* 12.6* 12.6* 12.8*  HCT 36.8* 37.1* 37.6* 37.0* 38.7*  MCV 96.3 96.4 96.4 95.9 96.8  PLT 293 315 354 321 381   Cardiac Enzymes: No results for input(s): CKTOTAL, CKMB, CKMBINDEX, TROPONINI in the last 168 hours. BNP (last 3 results) No results for input(s): PROBNP in the last 8760 hours. CBG: Recent Labs  Lab 10/08/20 0801 10/08/20 1203 10/08/20 1715 10/08/20 2114 10/09/20 0820  GLUCAP 162* 163* 174* 137* 176*   D-Dimer: No results for input(s):  DDIMER in the last 72 hours. Hgb A1c: No results for input(s): HGBA1C in the last 72 hours. Lipid Profile: No results for input(s): CHOL, HDL, LDLCALC, TRIG, CHOLHDL, LDLDIRECT in the last 72 hours. Thyroid function studies: No results for input(s): TSH, T4TOTAL, T3FREE, THYROIDAB in the last 72 hours.  Invalid input(s): FREET3 Anemia work up: No results for input(s): VITAMINB12, FOLATE, FERRITIN, TIBC, IRON, RETICCTPCT in the last 72 hours. Sepsis Labs: Recent Labs  Lab 10/06/20 0529 10/07/20 0350 10/08/20 0348 10/09/20 0436  WBC 11.3* 11.1* 9.3 11.4*    Microbiology No results found for this or any previous visit (from the past 240 hour(s)).  Procedures and diagnostic studies:  No results found.             LOS: 11 days   [employer], advertising on [email or web address]. If 7PM-7AM, please contact night-coverage at [email or web address]     10/09/2020, 11:57 AM

## 2020-10-10 LAB — GLUCOSE, CAPILLARY
Glucose-Capillary: 196 mg/dL — ABNORMAL HIGH (ref 70–99)
Glucose-Capillary: 281 mg/dL — ABNORMAL HIGH (ref 70–99)
Glucose-Capillary: 178 mg/dL — ABNORMAL HIGH (ref 70–99)
Glucose-Capillary: 276 mg/dL — ABNORMAL HIGH (ref 70–99)

## 2020-10-10 NOTE — Anesthesia Preprocedure Evaluation (Deleted)
Anesthesia Evaluation  Patient identified by MRN, date of birth, ID band  Airway        Dental   Pulmonary Current Smoker and Patient abstained from smoking.,           Cardiovascular      Neuro/Psych    GI/Hepatic   Endo/Other  diabetes  Renal/GU      Musculoskeletal   Abdominal   Peds  Hematology   Anesthesia Other Findings Past Medical History: No date: Diabetes mellitus without complication ([place]) No date: Heart disease No date: Myocardial infarction [facility]),  Reproductive/Obstetrics                            Anesthesia Physical Anesthesia Plan Anesthesia Quick Evaluation                                  Anesthesia Evaluation  Patient identified by MRN, date of birth, ID band Patient awake    Reviewed: Allergy & Precautions, H&P , NPO status , Patient's Chart, lab work & pertinent test results  History of Anesthesia Complications Negative for: history of anesthetic complications  Airway Mallampati: III   Neck ROM: full  Mouth opening: Limited Mouth Opening Comment: Large beard  Dental  (+) Edentulous Upper, Edentulous Lower   Pulmonary neg sleep apnea, neg COPD, Current Smoker and Patient abstained from smoking.,           Cardiovascular hypertension, (-) angina+ CAD and + Past MI  (-) Cardiac Stents (-) dysrhythmias      Neuro/Psych negative neurological ROS  negative psych ROS   GI/Hepatic negative GI ROS, Neg liver ROS,   Endo/Other  diabetes, Poorly Controlled  Renal/GU      Musculoskeletal   Abdominal   Peds  Hematology negative hematology ROS (+)   Anesthesia Other Findings   Reproductive/Obstetrics negative OB ROS                             Anesthesia Physical  Anesthesia Plan  ASA: III  Anesthesia Plan: General   Post-op Pain Management:    Induction:   PONV Risk Score and Plan: Ondansetron,  Dexamethasone, Midazolam and Treatment may vary due to age or medical condition  Airway Management Planned: LMA  Additional Equipment:   Intra-op Plan:   Post-operative Plan:   Informed Consent: I have reviewed the patients History and Physical, chart, labs and discussed the procedure including the risks, benefits and alternatives for the proposed anesthesia with the patient or authorized representative who has indicated his/her understanding and acceptance.       Plan Discussed with: CRNA  Anesthesia Plan Comments:         Anesthesia Quick Evaluation                                   Anesthesia Evaluation  Patient identified by MRN, date of birth, ID band Patient awake    Reviewed: Allergy & Precautions, H&P , NPO status , Patient's Chart, lab work & pertinent test results  History of Anesthesia Complications Negative for: history of anesthetic complications  Airway Mallampati: III   Neck ROM: full  Mouth opening: Limited Mouth Opening Comment: Large beard  Dental  (+) Edentulous Upper, Edentulous Lower   Pulmonary neg sleep  apnea, neg COPD, Current Smoker and Patient abstained from smoking.,    breath sounds clear to auscultation       Cardiovascular hypertension, (-) angina+ CAD and + Past MI  (-) Cardiac Stents (-) dysrhythmias  Rhythm:regular Rate:Normal     Neuro/Psych negative neurological ROS  negative psych ROS   GI/Hepatic negative GI ROS, Neg liver ROS,   Endo/Other  diabetes, Poorly Controlled  Renal/GU      Musculoskeletal   Abdominal   Peds  Hematology negative hematology ROS (+)   Anesthesia Other Findings Past Medical History: No date: Diabetes mellitus without complication ([place]) No date: Heart disease   Reproductive/Obstetrics negative OB ROS                           Anesthesia Physical Anesthesia Plan  ASA: III  Anesthesia Plan: General ETT   Post-op Pain Management:     Induction:   PONV Risk Score and Plan: Ondansetron, Dexamethasone, Midazolam and Treatment may vary due to age or medical condition  Airway Management Planned:   Additional Equipment:   Intra-op Plan:   Post-operative Plan:   Informed Consent: I have reviewed the patients History and Physical, chart, labs and discussed the procedure including the risks, benefits and alternatives for the proposed anesthesia with the patient or authorized representative who has indicated his/her understanding and acceptance.     Dental Advisory Given  Plan Discussed with: Anesthesiologist, CRNA and Surgeon  Anesthesia Plan Comments:         Anesthesia Quick Evaluation

## 2020-10-10 NOTE — TOC Progression Note (Signed)
Transition of Care [facility]) - Progression Note    Patient Details  Name: [name] MRN: [id number] Date of Birth: June 26, 1971  Transition of Care [facility]) CM/SW [place], RN Phone Number: 10/10/2020, 10:14 AM  Clinical Narrative:  Per chart, patient will need to discharge on home IV antibiotics. Carolynn Sayers with [facility] has been notified and will follow patient's progress.  Patient does not have insurance.  Will wait to set up charity home health until we have a discharge date.    Expected Discharge Plan: Home/Self Care Barriers to Discharge: Continued Medical Work up  Expected Discharge Plan and Services Expected Discharge Plan: Home/Self Care       Living arrangements for the past 2 months: Single Family Home                                       Social Determinants of Health (SDOH) Interventions    Readmission Risk Interventions No flowsheet data found.

## 2020-10-10 NOTE — Progress Notes (Signed)
Occupational Therapy Treatment Patient Details Name: [name] MRN: [id number] DOB: 1970/09/09 Today's Date: 10/10/2020    History of present illness MARISOL GLAZER is a 50 y.o. male is status post right partial fifth ray amputation with incision and drainage with application of antibiotic beads and wound VAC.  Patient has remained nonweightbearing since procedure.  He presents with R foot infection; cellulitis in diabetic foot.   OT comments  Upon entering the room, pt initially declined OT intervention but agreeable with max encouragement. Pt declined off loading shoe. OT reviewed precautions/wt bearing restrictions and discussed lateral scoot into recliner chair or stand pivot with RW and maintaining NWB. OT setting up chair and obtaining equipment and pt then impulsively standing next to the bed and breaking precautions. OT asking pt to returning to sitting position to which pt report, " I am only on my heel" with therapist providing education weight bearing thru heel with off loading shoe ONLY. Pt then continues to transfer into recliner chair. OT educated and demonstrated use of level 2 theraband for B UE strengthening. Pt performing 2 sets of 10 chest pulls, shoulder diagonals, shoulder elevation, and alternating punches with pt needing min cuing for proper technique and rest breaks with fatigue. Theraband left with pt for him to continue to work on while in room. OT again reviewed precautions and how pt should transfer back to bed with staff and chair alarm activated. All needs within reach.    Follow Up Recommendations  Home health OT;Supervision - Intermittent    Equipment Recommendations  Other (comment) (drop arm commode chair and RW)       Precautions / Restrictions Precautions Precautions: Fall Restrictions Weight Bearing Restrictions: Yes RLE Weight Bearing: Non weight bearing Other Position/Activity Restrictions: PWB heel only with off-loading shoe on for transfers only        Mobility Bed Mobility Overal bed mobility: Modified Independent             General bed mobility comments: HOB elevated    Transfers Overall transfer level: Needs assistance   Transfers: Sit to/from Stand;Stand Pivot Transfers Sit to Stand: Supervision   Squat pivot transfers: Min assist     General transfer comment: Pt very impulsive with transfer and no following weight bearing restictions.    Balance Overall balance assessment: Needs assistance Sitting-balance support: Feet supported;No upper extremity supported Sitting balance-Leahy Scale: Normal     Standing balance support: During functional activity;Bilateral upper extremity supported Standing balance-Leahy Scale: Fair                             ADL either performed or assessed with clinical judgement        Vision Baseline Vision/History: No visual deficits Patient Visual Report: No change from baseline            Cognition Arousal/Alertness: Awake/alert Behavior During Therapy: Flat affect;WFL for tasks assessed/performed Overall Cognitive Status: Within Functional Limits for tasks assessed                                                     Pertinent Vitals/ Pain       Pain Assessment: No/denies pain         Frequency  Min 2X/week        Progress Toward  Goals  OT Goals(current goals can now be found in the care plan section)  Progress towards OT goals: Progressing toward goals  Acute Rehab OT Goals Patient Stated Goal: to get home OT Goal Formulation: With patient Time For Goal Achievement: 10/20/20 Potential to Achieve Goals: Good  Plan Discharge plan remains appropriate;Frequency remains appropriate       AM-PAC OT "6 Clicks" Daily Activity     Outcome Measure   Help from another person eating meals?: None Help from another person taking care of personal grooming?: None Help from another person toileting, which includes using  toliet, bedpan, or urinal?: A Lot Help from another person bathing (including washing, rinsing, drying)?: A Lot Help from another person to put on and taking off regular upper body clothing?: None Help from another person to put on and taking off regular lower body clothing?: A Little 6 Click Score: 19    End of Session    OT Visit Diagnosis: Unsteadiness on feet (R26.81);Muscle weakness (generalized) (M62.81);History of falling (Z91.81)   Activity Tolerance Patient tolerated treatment well   Patient Left with call bell/phone within reach;in chair;with chair alarm set   Nurse Communication Mobility status;Patient requests pain meds        Time: 1586-8257 OT Time Calculation (min): 33 min  Charges: OT General Charges $OT Visit: 1 Visit OT Treatments $Therapeutic Activity: 8-22 mins $Therapeutic Exercise: 8-22 mins  Darleen Crocker, MS, OTR/L , CBIS ascom [phone]  10/10/20, 4:35 PM

## 2020-10-10 NOTE — Progress Notes (Signed)
PT Cancellation Note  Patient Details Name: [name] MRN: [id number] DOB: February 03, 1971   Cancelled Treatment:     Pt c/o fatigue from melatonin, declined any activity. Pt awaiting surgery tomorrow. Will await new orders.   [name] 10/10/2020, 3:02 PM

## 2020-10-10 NOTE — Progress Notes (Addendum)
Progress Note    [name]  UXN:[id number] DOB: 05-16-1971  DOA: 09/27/2020 PCP: Coral Spikes, DO      Brief Narrative:    Medical records reviewed and are as summarized below:  [name] is a 50 y.o. male with medical history significant for CAD, hypertension, hyperlipidemia, diabetes mellitus, right diabetic foot ulcer.  He presented to the hospital because of pain, swelling and redness of the right foot with increasing drainage from the right foot ulcer.  He reported falling off his truck and sustaining a cut to his right foot after slipping on ice.  This happened a few days prior to admission  He was admitted to the hospital for sepsis secondary to right foot cellulitis with abscess and osteomyelitis.  He was treated with empiric IV antibiotics, IV fluids and analgesics.  He was seen in consultation by the podiatrist.  He underwent partial fifth ray amputation, incision and drainage of the right foot with placement of antibiotic beads on 10/01/2020.  He was also evaluated by the vascular surgeon and he underwent abdominal aortogram and right lower extremity distal runoff fat order catheter placement on 10/03/2020.  No hemodynamically significant stenosis was identified.  He has poorly controlled type 2 diabetes mellitus.  Hemoglobin A1c was 11.2. He was started on Lantus on NovoLog.    Assessment/Plan:   Principal Problem:   Sepsis ([place]) Active Problems:   Coronary artery disease involving native coronary artery of native heart without angina pectoris   Uncontrolled type 2 diabetes mellitus with circulatory disorder, without long-term current use of insulin (HCC)   Essential hypertension   Hyperlipidemia   Cellulitis in diabetic foot (HCC)   Transaminitis   Body mass index is 29.53 kg/m.    Sepsis secondary to right foot cellulitis, abscess and osteomyelitis, right diabetic foot ulcer: s/p partial fifth ray amputation, incision and drainage of the right foot  with placement of antibiotic beads on 10/01/2020.  S/p abdominal aortogram no significant blockage was noted.  Continue IV Unasyn.  Plan for repeat surgery on right foot tomorrow.  Follow-up with ID and podiatrist.    Type II DM with hyperglycemia: Hemoglobin A1c 11.2.  Continue Lantus and NovoLog.  Monitor glucose levels closely.  Sinus headache/sinusitis: Improved.  Continue Flonase nasal spray.  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  Other comorbidities include CAD, hypertension, hyperlipidemia  He does not have insurance.  Follow-up with case manager to assist with disposition.   Diet Order            Diet NPO time specified  Diet effective ____           Diet heart healthy/carb modified Room service appropriate? Yes; Fluid consistency: Thin  Diet effective now                    Consultants:  Podiatrist  Vascular surgeon  General surgeon  Procedures:  Partial fifth ray amputation, incision and drainage of multiple locations in the dorsal and plantar right foot, placement of antibiotic beads and placement of wound VAC on 10/01/2020.  Abdominal aortogram, right lower extremity distal runoff third order catheter placement on 10/03/2020    Medications:   . aspirin EC  81 mg Oral Daily  . atorvastatin  40 mg Oral Daily  . carvedilol  3.125 mg Oral BID WC  . chlorhexidine  60 mL  Topical Once  . fluticasone  2 spray Each Nare Daily  . gabapentin  600 mg Oral QHS  . heparin  5,000 Units Subcutaneous Q8H  . insulin aspart  0-15 Units Subcutaneous TID WC  . insulin aspart  0-5 Units Subcutaneous QHS  . insulin aspart  8 Units Subcutaneous TID WC  . insulin glargine  30 Units Subcutaneous Daily  . lisinopril  5 mg Oral Daily  . melatonin  5 mg Oral QHS  . povidone-iodine  2 application Topical Once  . povidone-iodine   2 application Topical Once  . sodium chloride flush  3 mL Intravenous Q12H   Continuous Infusions: . sodium chloride    . ampicillin-sulbactam (UNASYN) IV 3 g (10/10/20 0612)     Anti-infectives (From admission, onward)   Start     Dose/Rate Route Frequency Ordered Stop   10/06/20 0600  ceFAZolin (ANCEF) IVPB 2g/100 mL premix        2 g 200 mL/hr over 30 Minutes Intravenous On call to O.R. 10/05/20 2241 10/07/20 0559   10/03/20 1045  ceFAZolin (ANCEF) IVPB 1 g/50 mL premix  Status:  Discontinued       Note to Pharmacy: To be given in specials   1 g 100 mL/hr over 30 Minutes Intravenous  Once 10/03/20 0956 10/03/20 1001   10/03/20 1045  ceFAZolin (ANCEF) IVPB 2g/100 mL premix       Note to Pharmacy: To be given in specials   2 g 200 mL/hr over 30 Minutes Intravenous  Once 10/03/20 1001 10/03/20 1245   10/02/20 1800  Ampicillin-Sulbactam (UNASYN) 3 g in sodium chloride 0.9 % 100 mL IVPB        3 g 200 mL/hr over 30 Minutes Intravenous Every 6 hours 10/02/20 1628     09/28/20 1800  vancomycin (VANCOREADY) IVPB 1500 mg/300 mL  Status:  Discontinued        1,500 mg 150 mL/hr over 120 Minutes Intravenous Every 12 hours 09/28/20 0915 09/28/20 1553   09/28/20 1800  linezolid (ZYVOX) IVPB 600 mg  Status:  Discontinued        600 mg 300 mL/hr over 60 Minutes Intravenous Every 12 hours 09/28/20 1553 10/02/20 1625   09/28/20 1700  piperacillin-tazobactam (ZOSYN) IVPB 3.375 g  Status:  Discontinued        3.375 g 12.5 mL/hr over 240 Minutes Intravenous Every 8 hours 09/28/20 1553 10/02/20 1624   09/28/20 0600  ceFEPIme (MAXIPIME) 2 g in sodium chloride 0.9 % 100 mL IVPB  Status:  Discontinued        2 g 200 mL/hr over 30 Minutes Intravenous Every 8 hours 09/28/20 0216 09/28/20 1553   09/28/20 0500  vancomycin (VANCOREADY) IVPB 1750 mg/350 mL  Status:  Discontinued        1,750 mg 175 mL/hr over 120 Minutes Intravenous Every 12 hours 09/28/20 0222 09/28/20 0915   09/28/20 0200   metroNIDAZOLE (FLAGYL) IVPB 500 mg  Status:  Discontinued        500 mg 100 mL/hr over 60 Minutes Intravenous Every 8 hours 09/27/20 2320 09/28/20 1552   09/27/20 2130  ceFEPIme (MAXIPIME) 2 g in sodium chloride 0.9 % 100 mL IVPB        2 g 200 mL/hr over 30 Minutes Intravenous  Once 09/27/20 2120 09/27/20 2310   09/27/20 2130  metroNIDAZOLE (FLAGYL) IVPB 500 mg  Status:  Discontinued        500 mg 100 mL/hr over 60 Minutes Intravenous  Once 09/27/20 2120 09/28/20 0122   09/27/20 2130  vancomycin (VANCOCIN) IVPB 1000 mg/200 mL premix        1,000 mg 200 mL/hr over 60 Minutes Intravenous  Once 09/27/20 2120 09/28/20 0106             Family Communication/Anticipated D/C date and plan/Code Status   DVT prophylaxis: heparin injection 5,000 Units Start: 09/27/20 2315     Code Status: Full Code  Family Communication: None Disposition Plan:    Status is: Inpatient  Remains inpatient appropriate because:IV treatments appropriate due to intensity of illness or inability to take PO   Dispo: The patient is from: Home              Anticipated d/c is to: Home              Anticipated d/c date is: > 3 days              Patient currently is not medically stable to d/c.   Difficult to place patient Yes           Subjective:   No new complaints.  He still has pain in the right foot.  Objective:    Vitals:   10/09/20 1927 10/10/20 0023 10/10/20 0401 10/10/20 0750  BP: 103/71 129/80 101/62 101/64  Pulse: 72 75 67 67  Resp: 18 18 14 18   Temp: 98.6 F (37 C) 97.8 F (36.6 C) 98.2 F (36.8 C) 98.3 F (36.8 C)  TempSrc: Oral Oral Oral Oral  SpO2: 98% 100% 98% 96%  Weight:      Height:       No data found.   Intake/Output Summary (Last 24 hours) at 10/10/2020 1143 Last data filed at 10/10/2020 1051 Gross per 24 hour  Intake 600 ml  Output 2450 ml  Net -1850 ml   Filed Weights   09/27/20 1859 09/29/20 1000  Weight: 104.3 kg 104.3 kg    Exam  GEN:  NAD SKIN: No rash EYES: No pallor or icterus ENT: MMM CV: RRR PULM: CTA B ABD: soft, ND, NT, +BS CNS: AAO x 3, non focal EXT: Dressing on right foot is clean, dry and intact             Data Reviewed:   I have personally reviewed following labs and imaging studies:  Labs: Labs show the following:   Basic Metabolic Panel: Recent Labs  Lab 10/05/20 0513 10/06/20 0529 10/07/20 0350 10/08/20 0348 10/09/20 0436  NA 134* 134* 132* 132* 134*  K 4.1 3.8 3.7 3.9 4.2  CL 97* 99 98 99 98  CO2 28 26 28 26 28   GLUCOSE 241* 212* 184* 179* 187*  BUN 13 13 14 17 19   CREATININE 0.83 0.91 0.80 1.01 0.98  CALCIUM 8.6* 8.7* 8.3* 8.5* 9.0  MG 1.8 1.8 1.6* 1.7 1.8  PHOS 3.9 3.8 3.8 3.8 3.6   GFR Estimated Creatinine Clearance: 116.1 mL/min (by C-G formula based on SCr of 0.98 mg/dL). Liver Function Tests: Recent Labs  Lab 10/05/20 0513 10/06/20 0529 10/07/20 0350 10/08/20 0348 10/09/20 0436  AST 35 32 37 32 35  ALT 28 26 27 25 26   ALKPHOS 263* 259* 255* 227* 235*  BILITOT 0.7 0.6 0.8 0.8 0.6  PROT 7.2 7.4 7.5 7.3 8.0  ALBUMIN 2.0* 2.1* 2.2* 2.2* 2.4*   No results for input(s): LIPASE, AMYLASE in the last 168 hours. No results for input(s): AMMONIA in the last 168 hours. Coagulation profile No results  for input(s): INR, PROTIME in the last 168 hours.  CBC: Recent Labs  Lab 10/05/20 0513 10/06/20 0529 10/07/20 0350 10/08/20 0348 10/09/20 0436  WBC 9.2 11.3* 11.1* 9.3 11.4*  NEUTROABS 5.9 7.3 7.0 5.4 6.6  HGB 12.3* 12.6* 12.6* 12.6* 12.8*  HCT 36.8* 37.1* 37.6* 37.0* 38.7*  MCV 96.3 96.4 96.4 95.9 96.8  PLT 293 315 354 321 381   Cardiac Enzymes: No results for input(s): CKTOTAL, CKMB, CKMBINDEX, TROPONINI in the last 168 hours. BNP (last 3 results) No results for input(s): PROBNP in the last 8760 hours. CBG: Recent Labs  Lab 10/09/20 0820 10/09/20 1220 10/09/20 1646 10/09/20 2052 10/10/20 0830  GLUCAP 176* 181* 166* 177* 196*   D-Dimer: No  results for input(s): DDIMER in the last 72 hours. Hgb A1c: No results for input(s): HGBA1C in the last 72 hours. Lipid Profile: No results for input(s): CHOL, HDL, LDLCALC, TRIG, CHOLHDL, LDLDIRECT in the last 72 hours. Thyroid function studies: No results for input(s): TSH, T4TOTAL, T3FREE, THYROIDAB in the last 72 hours.  Invalid input(s): FREET3 Anemia work up: No results for input(s): VITAMINB12, FOLATE, FERRITIN, TIBC, IRON, RETICCTPCT in the last 72 hours. Sepsis Labs: Recent Labs  Lab 10/06/20 0529 10/07/20 0350 10/08/20 0348 10/09/20 0436  WBC 11.3* 11.1* 9.3 11.4*    Microbiology No results found for this or any previous visit (from the past 240 hour(s)).  Procedures and diagnostic studies:  DG Foot 2 Views Right  Result Date: 10/09/2020 CLINICAL DATA:  Status post amputation of the fifth ray today. EXAM: RIGHT FOOT - 2 VIEW COMPARISON:  Plain films of the right foot 09/27/2020. FINDINGS: The patient is status post amputation at the level of the proximal diaphysis of the fifth metatarsal. Surgical wound is noted and bandaging is in place. No acute abnormality. Talonavicular osteoarthritis noted. Antibiotic impregnated beads are seen in the plantar soft tissues at the surgical site. IMPRESSION: Postoperative change as described.  No acute finding. Electronically Signed   By: Inge Rise M.D.   On: 10/09/2020 14:14               LOS: 12 days   Damyra Luscher  [facility]   Pager on [email or web address]. If 7PM-7AM, please contact night-coverage at [email or web address]     10/10/2020, 11:43 AM

## 2020-10-11 ENCOUNTER — Inpatient Hospital Stay: Payer: Self-pay | Admitting: Anesthesiology

## 2020-10-11 ENCOUNTER — Inpatient Hospital Stay: Payer: Self-pay

## 2020-10-11 ENCOUNTER — Encounter: Payer: Self-pay | Admitting: Internal Medicine

## 2020-10-11 ENCOUNTER — Encounter
Admission: EM | Disposition: A | Discharge status: Home Health | Payer: Self-pay | Source: Home / Self Care | Attending: Internal Medicine

## 2020-10-11 HISTORY — PX: AMPUTATION: SHX166

## 2020-10-11 HISTORY — PX: TENDON LENGTHENING: SHX6786

## 2020-10-11 LAB — GLUCOSE, CAPILLARY
Glucose-Capillary: 249 mg/dL — ABNORMAL HIGH (ref 70–99)
Glucose-Capillary: 140 mg/dL — ABNORMAL HIGH (ref 70–99)
Glucose-Capillary: 94 mg/dL (ref 70–99)
Glucose-Capillary: 115 mg/dL — ABNORMAL HIGH (ref 70–99)
Glucose-Capillary: 170 mg/dL — ABNORMAL HIGH (ref 70–99)

## 2020-10-11 SURGERY — AMPUTATION, FOOT, RAY
Anesthesia: General | Site: Foot | Laterality: Right

## 2020-10-11 MED ORDER — DEXMEDETOMIDINE (PRECEDEX) IN NS 20 MCG/5ML (4 MCG/ML) IV SYRINGE
PREFILLED_SYRINGE | INTRAVENOUS | Status: AC
  Filled 2020-10-11: qty 5

## 2020-10-11 MED ORDER — SODIUM CHLORIDE 0.9 % IV SOLN: INTRAVENOUS | Status: DC

## 2020-10-11 MED ORDER — DEXAMETHASONE SODIUM PHOSPHATE 10 MG/ML IJ SOLN
INTRAMUSCULAR | Status: DC | PRN
  Administered 2020-10-11: 5 mg via INTRAVENOUS

## 2020-10-11 MED ORDER — GABAPENTIN 300 MG PO CAPS
300.0000 mg | ORAL_CAPSULE | Freq: Every day | ORAL | Status: DC
  Administered 2020-10-11 – 2020-10-14 (×4): 300 mg via ORAL
  Filled 2020-10-11 (×4): qty 1

## 2020-10-11 MED ORDER — PROPOFOL 10 MG/ML IV BOLUS
INTRAVENOUS | Status: DC | PRN
  Administered 2020-10-11: 200 mg via INTRAVENOUS

## 2020-10-11 MED ORDER — BUPIVACAINE HCL 0.5 % IJ SOLN
INTRAMUSCULAR | Status: DC | PRN
  Administered 2020-10-11: 10 mL

## 2020-10-11 MED ORDER — FENTANYL CITRATE (PF) 100 MCG/2ML IJ SOLN
INTRAMUSCULAR | Status: DC | PRN
  Administered 2020-10-11 (×4): 25 ug via INTRAVENOUS

## 2020-10-11 MED ORDER — PROPOFOL 10 MG/ML IV BOLUS
INTRAVENOUS | Status: AC
  Filled 2020-10-11: qty 20

## 2020-10-11 MED ORDER — FENTANYL CITRATE (PF) 100 MCG/2ML IJ SOLN
25.0000 ug | INTRAMUSCULAR | Status: DC | PRN
Start: 2020-10-11 — End: 2020-10-11

## 2020-10-11 MED ORDER — DEXMEDETOMIDINE (PRECEDEX) IN NS 20 MCG/5ML (4 MCG/ML) IV SYRINGE
PREFILLED_SYRINGE | INTRAVENOUS | Status: DC | PRN
  Administered 2020-10-11: 12 ug via INTRAVENOUS
  Administered 2020-10-11: 8 ug via INTRAVENOUS

## 2020-10-11 MED ORDER — NEOMYCIN-POLYMYXIN B GU 40-200000 IR SOLN
Status: DC | PRN
  Administered 2020-10-11: 2 mL

## 2020-10-11 MED ORDER — HEMOSTATIC AGENTS (NO CHARGE) OPTIME
TOPICAL | Status: DC | PRN
  Administered 2020-10-11: 1 via TOPICAL

## 2020-10-11 MED ORDER — PHENYLEPHRINE HCL (PRESSORS) 10 MG/ML IV SOLN
INTRAVENOUS | Status: DC | PRN
  Administered 2020-10-11 (×3): 100 ug via INTRAVENOUS

## 2020-10-11 MED ORDER — BUPIVACAINE HCL (PF) 0.5 % IJ SOLN
INTRAMUSCULAR | Status: AC
  Filled 2020-10-11: qty 30

## 2020-10-11 MED ORDER — NEOMYCIN-POLYMYXIN B GU 40-200000 IR SOLN
Status: AC
  Filled 2020-10-11: qty 2

## 2020-10-11 MED ORDER — ONDANSETRON HCL 4 MG/2ML IJ SOLN
INTRAMUSCULAR | Status: DC | PRN
  Administered 2020-10-11: 4 mg via INTRAVENOUS

## 2020-10-11 MED ORDER — EPHEDRINE 5 MG/ML INJ
INTRAVENOUS | Status: AC
  Filled 2020-10-11: qty 10

## 2020-10-11 MED ORDER — EPHEDRINE SULFATE 50 MG/ML IJ SOLN
INTRAMUSCULAR | Status: DC | PRN
  Administered 2020-10-11: 15 mg via INTRAVENOUS
  Administered 2020-10-11: 10 mg via INTRAVENOUS

## 2020-10-11 MED ORDER — ONDANSETRON HCL 4 MG/2ML IJ SOLN: 4.0000 mg | Freq: Once | INTRAMUSCULAR | Status: DC | PRN

## 2020-10-11 MED ORDER — LIDOCAINE HCL (CARDIAC) PF 100 MG/5ML IV SOSY
PREFILLED_SYRINGE | INTRAVENOUS | Status: DC | PRN
  Administered 2020-10-11: 100 mg via INTRAVENOUS

## 2020-10-11 MED ORDER — FENTANYL CITRATE (PF) 100 MCG/2ML IJ SOLN
INTRAMUSCULAR | Status: AC
  Filled 2020-10-11: qty 2

## 2020-10-11 SURGICAL SUPPLY — 56 items
BLADE MED AGGRESSIVE (BLADE) ×3 IMPLANT
BLADE OSC/SAGITTAL MD 5.5X18 (BLADE) IMPLANT
BLADE SURG 15 STRL LF DISP TIS (BLADE) IMPLANT
BLADE SURG 15 STRL SS (BLADE) ×12
BLADE SURG MINI STRL (BLADE) IMPLANT
BNDG CONFORM 2 STRL LF (GAUZE/BANDAGES/DRESSINGS) ×1 IMPLANT
BNDG ELASTIC 4X5.8 VLCR STR LF (GAUZE/BANDAGES/DRESSINGS) ×3 IMPLANT
BNDG ESMARK 4X12 TAN STRL LF (GAUZE/BANDAGES/DRESSINGS) ×3 IMPLANT
BNDG GAUZE 4.5X4.1 6PLY STRL (MISCELLANEOUS) ×4 IMPLANT
BOOT STEPPER DURA LG (SOFTGOODS) ×1 IMPLANT
CANISTER SUCT 1200ML W/VALVE (MISCELLANEOUS) ×3 IMPLANT
CNTNR SPEC 2.5X3XGRAD LEK (MISCELLANEOUS) ×2
CONT SPEC 4OZ STER OR WHT (MISCELLANEOUS) ×1
CONT SPEC 4OZ STRL OR WHT (MISCELLANEOUS) ×2
CONTAINER SPEC 2.5X3XGRAD LEK (MISCELLANEOUS) ×2 IMPLANT
COVER WAND RF STERILE (DRAPES) ×3 IMPLANT
CUFF TOURN DUAL PL 12 NO SLV (MISCELLANEOUS) IMPLANT
CUFF TOURN SGL QUICK 18X4 (TOURNIQUET CUFF) IMPLANT
CUFF TOURN SGL QUICK 30 (TOURNIQUET CUFF) ×3
CUFF TRNQT CYL 30X4X21-28X (TOURNIQUET CUFF) IMPLANT
DRAPE FLUOR MINI C-ARM 54X84 (DRAPES) IMPLANT
DURAPREP 26ML APPLICATOR (WOUND CARE) ×3 IMPLANT
ELECT REM PT RETURN 9FT ADLT (ELECTROSURGICAL) ×3
ELECTRODE REM PT RTRN 9FT ADLT (ELECTROSURGICAL) ×2 IMPLANT
GAUZE SPONGE 4X4 12PLY STRL (GAUZE/BANDAGES/DRESSINGS) ×6 IMPLANT
GAUZE SPONGE 4X4 16PLY XRAY LF (GAUZE/BANDAGES/DRESSINGS) ×1 IMPLANT
GAUZE XEROFORM 1X8 LF (GAUZE/BANDAGES/DRESSINGS) ×4 IMPLANT
GLOVE SURG ENC MOIS LTX SZ7 (GLOVE) ×3 IMPLANT
GLOVE SURG UNDER LTX SZ7 (GLOVE) ×3 IMPLANT
GOWN STRL REUS W/ TWL LRG LVL3 (GOWN DISPOSABLE) ×4 IMPLANT
GOWN STRL REUS W/TWL LRG LVL3 (GOWN DISPOSABLE) ×6
HANDPIECE VERSAJET DEBRIDEMENT (MISCELLANEOUS) IMPLANT
HEMOSTAT SURGICEL 2X3 (HEMOSTASIS) ×1 IMPLANT
KIT TURNOVER KIT A (KITS) ×3 IMPLANT
LABEL OR SOLS (LABEL) ×3 IMPLANT
MANIFOLD NEPTUNE II (INSTRUMENTS) ×3 IMPLANT
NDL FILTER BLUNT 18X1 1/2 (NEEDLE) ×2 IMPLANT
NDL HYPO 25X1 1.5 SAFETY (NEEDLE) ×4 IMPLANT
NEEDLE FILTER BLUNT 18X 1/2SAF (NEEDLE) ×1
NEEDLE FILTER BLUNT 18X1 1/2 (NEEDLE) ×2 IMPLANT
NEEDLE HYPO 25X1 1.5 SAFETY (NEEDLE) ×6 IMPLANT
NS IRRIG 500ML POUR BTL (IV SOLUTION) ×3 IMPLANT
PACK EXTREMITY ARMC (MISCELLANEOUS) ×3 IMPLANT
PAD ABD DERMACEA PRESS 5X9 (GAUZE/BANDAGES/DRESSINGS) ×2 IMPLANT
SOL .9 NS 3000ML IRR  AL (IV SOLUTION)
SOL .9 NS 3000ML IRR AL (IV SOLUTION)
SOL .9 NS 3000ML IRR UROMATIC (IV SOLUTION) IMPLANT
SOL PREP PVP 2OZ (MISCELLANEOUS) ×3
SOLUTION PREP PVP 2OZ (MISCELLANEOUS) ×2 IMPLANT
STOCKINETTE STRL 6IN 960660 (GAUZE/BANDAGES/DRESSINGS) ×3 IMPLANT
SUT ETHILON 3-0 FS-10 30 BLK (SUTURE) ×9
SUT VIC AB 3-0 SH 27 (SUTURE) ×6
SUT VIC AB 3-0 SH 27X BRD (SUTURE) ×2 IMPLANT
SUTURE EHLN 3-0 FS-10 30 BLK (SUTURE) ×4 IMPLANT
SWAB CULTURE AMIES ANAERIB BLU (MISCELLANEOUS) IMPLANT
SYR 10ML LL (SYRINGE) ×3 IMPLANT

## 2020-10-11 NOTE — Progress Notes (Signed)
PT Cancellation Note  Patient Details Name: [name] MRN: [id number] DOB: August 17, 1971   Cancelled Treatment:     Pt awaiting transport to OR, declined any activity.   [name] 10/11/2020, 2:50 PM

## 2020-10-11 NOTE — Op Note (Signed)
PODIATRY / FOOT AND ANKLE SURGERY OPERATIVE REPORT    SURGEON: Caroline More, DPM  PRE-OPERATIVE DIAGNOSIS:  1.  Osteomyelitis fifth metatarsal and fourth metatarsal right foot 2.  Large nonhealing ulceration with dry gangrene present to the right lateral foot with bone exposed 3.  Status post incision and drainage with large plantar incision through the forefoot and midfoot with open wound 4.  Equinus 5.  Diabetes type 2 polyneuropathy 6.  PVD  POST-OPERATIVE DIAGNOSIS: Same  PROCEDURE(S): 1. Right transmetatarsal imitation 2. Right tendo Achilles lengthening 3. Rotational skin flap closure  HEMOSTASIS: Right thigh tourniquet  ANESTHESIA: general  ESTIMATED BLOOD LOSS: 50 cc  FINDING(S): 1.  Large necrotic ulceration to the lateral aspect of the right foot with fourth and fifth metatarsal bone exposed.  Necrosis noted to the dorsal lateral flap  PATHOLOGY/SPECIMEN(S): Right forefoot path specimen  INDICATIONS:   [name] is a 49 y.o. male who presents status post incision and drainage and further incision and drainage with partial fifth ray amputation.  Patient now presents the hospital after CT angiogram was performed showing flow to the foot that appears to be adequate for potential healing.  Discussed all treatment options with the patient both conservative and surgical attempts at correction clean potential risks and complications at this time patient is elected for procedure consisting of right transmetatarsal amputation, tendo Achilles lengthening, and rotational skin flap closure.  Discussed risks and complications of the procedure in detail.  Discussed with patient that he has a large void that will be difficult to close to the outside aspect of the foot and does have a lot of skin compromise which will make healing difficult.  If patient is unable to heal this then he will likely need a more proximal amputation.  Patient understands and would like to proceed with his  procedure that was performed today as described above..  DESCRIPTION: After obtaining full informed written consent, the patient was brought back to the operating room and placed supine upon the operating table.  The patient received IV antibiotics prior to induction.  A right ankle block was performed with 20 cc of half percent Marcaine plain and about the Achilles tendon area after obtaining adequate anesthesia, the patient was prepped and draped in the standard fashion.  An Esmarch bandage was used to exsanguinate the right lower extremity pneumatic thigh tourniquet was inflated.  Attention was directed to the posterior aspect of the right leg along the Achilles tendon course where 3 percutaneous stab incisions were made approximately 3 to 4 cm apart at the posterior aspect of the leg.  These incisions were made at the midline the Achilles tendon parallel to the long axis of the leg and Achilles.  2 incisions that were both distal and proximal were directed medially to release a portion of the Achilles tendon and the central incision was directed laterally.  The foot was then maximally dorsiflexed and increased dorsiflexion was noted to approximately 10 degrees with the knee extended indicating successful lengthening of Achilles tendon.  Skin staples were then placed through the incisions and the skin edges appear to be well approximated overall.  At this point a circumferential incision was made around the area of necrosis to the dorsal foot as well as the wound that was present to the fifth ray and fourth ray.  This was done all the way to bone.  At this time the skin incision was extended from the distal fourth metatarsal phalangeal joint to the first metatarsal  phalangeal joint dorsally creating a dorsal medial V type of incision and this was extended plantarly as far distal as possible from the plantar first metatarsal area to fourth metatarsal area.  This incision was made straight to bone.  At this  time extensor tenotomy's and capsulotomies as well as releases of the collateral and suspensory ligaments and plantar plate as well as flexor tendon connections were incised and performed metatarsal phalangeal joints 1, 2, 3, 4.  The digits were disarticulated and passed off the operative site and sent off for pathology.  Circumferential dissection was then continued around the first, second, third, fourth, and fifth metatarsals to the very proximal bases.  At this time a sagittal bone saw was used to resect the metatarsals 1 through 5 to the proximal bases with the appropriate beveling.  Circumferential dissection was then performed and metatarsals 1, 2, 3, 4, 5 were passed off and sent off to pathology.  All plantar plates as well as sesamoids were then disarticulated and passed off the operative site.  The flexor and extensor tendons were cut as far proximally as possible.  Any obvious bleeding vessels were cauterized.  The plantar skin flap which was previously incised was debrided of all nonviable and potentially necrotic tissues.  The pneumatic thigh tourniquet was deflated during this time and once again any bleeding vessels were cauterized further.  Surgicel was also packed into some these bleeding areas for hemostatic control which appeared to be stable.    The deep tissues then reapproximated well coapted with 3-0 Vicryl.  The skin incision that was then made where the flaps that were made were then manipulated in such a way to cover the lateral void.  The plantar flap was rotated to cover the void that previously measured approximately 10 cm x 7 cm x 2 cm.  When the plantar flap was rotated it was held into place with some temporary 3-0 nylon sutures.  While holding the flap in this position the deeper tissues as well as subcutaneous tissues were reapproximated well coapted with 3-0 Vicryl.  The skin was then reapproximated well coapted with combination of 3-0 nylon and skin staples in simple and  horizontal mattress type stitching as well as vertical mattress type stitching.  The skin edges appear to be well coapted overall.  The plantar flap appeared to have capillary fill time intact to most of the plantar flap with the exception of the lateral aspect of the plantar flap in the area of the plantar incision that was made from the previous incision and drainage.  This area continued to pink and up over time but still appeared to be somewhat dusky.  A postoperative dressing was then applied consisting of Xeroform to the incision line followed by 4 x 4 gauze, ABD, Kerlix, Ace wrap.  The patient tolerated the procedure and anesthesia well was transferred to recovery room vital signs stable vascular status appearing to be still somewhat questionable to the plantar flap of the right foot.  We will continue to monitor this closely on a daily basis.  If patient subsequently ends up with necrosis of the plantar flap and he will likely need a more proximal amputation.  Patient will be discharged back to the inpatient room with the appropriate orders and instructions as well as medications.  We will follow up with patient tomorrow for dressing change.  Applied cam boot.  Patient to keep this on and remain nonweightbearing on the right foot.  COMPLICATIONS: None  CONDITION: Stable  Caroline More, DPM

## 2020-10-11 NOTE — H&P (Signed)
HISTORY AND PHYSICAL INTERVAL NOTE:  10/11/2020  5:32 PM  [name]  has presented today for surgery, with the diagnosis of osteomyelitis, nonhealing right foot wound, diabetes type 2 with polyneuropathy.  The various methods of treatment have been discussed with the patient.  No guarantees were given.  After consideration of risks, benefits and other options for treatment, the patient has consented to surgery.  I have reviewed the patients' chart and labs.  PROCEDURE: RIGHT TRANSMETATARSAL AMPUTATION WITH ACHILLES TENDON LENGTHENING     A history and physical examination was performed in the hospital.  The patient was reexamined.  There have been no changes to this history and physical examination.  Caroline More, DPM

## 2020-10-11 NOTE — Progress Notes (Signed)
PROGRESS NOTE    [name]   UJW:[id number]  DOB: 1971-07-04  PCP: Coral Spikes, DO    DOA: 09/27/2020 LOS: 13   Brief Narrative   [name] is a 50 y.o. male with medical history significant for CAD, hypertension, hyperlipidemia, diabetes mellitus, right diabetic foot ulcer.  He presented to the hospital because of pain, swelling and redness of the right foot with increasing drainage from the right foot ulcer.  He reported falling off his truck and sustaining a cut to his right foot after slipping on ice.  This happened a few days prior to admission   He was admitted to the hospital for sepsis secondary to right foot cellulitis with abscess and osteomyelitis.  He was treated with empiric IV antibiotics, IV fluids and analgesics.  He was seen in consultation by the podiatrist.  He underwent partial fifth ray amputation, incision and drainage of the right foot with placement of antibiotic beads on 10/01/2020.  He was also evaluated by the vascular surgeon and he underwent abdominal aortogram and right lower extremity distal runoff fat order catheter placement on 10/03/2020.  No hemodynamically significant stenosis was identified.   He has poorly controlled type 2 diabetes mellitus.  Hemoglobin A1c was 11.2. He was started on Lantus on NovoLog.    Assessment & Plan   Principal Problem:   Sepsis ([place]) Active Problems:   Coronary artery disease involving native coronary artery of native heart without angina pectoris   Uncontrolled type 2 diabetes mellitus with circulatory disorder, without long-term current use of insulin (HCC)   Essential hypertension   Hyperlipidemia   Cellulitis in diabetic foot (HCC)   Transaminitis   Sepsis secondary to right foot cellulitis with abscess and osteomyelitis Right diabetic foot ulcer status post partial fifth ray amputation Sepsis present on admission with tachycardia, leukocytosis in setting of cellulitis. --Podiatry, infectious disease  are consulted --Continue Unasyn, will need long course --Plan for return to OR today  Type 2 diabetes with hyperglycemia, peripheral neuropathy-uncontrolled, hemoglobin A1c 11.2%.   Home regimen appears to be Metformin, glipizide, Jardiance --Continue Lantus, NovoLog --Titrate insulin for inpatient goal 140-180 --Continue gabapentin   Sinus headaches, sinusitis -improved with Flonase.  Analgesics as needed for headache.  Carpal tunnel syndrome -patient complains of bilateral carpal tunnel symptoms, left greater than right, involving all fingers.  No elbow pain.  No sign of atrophy or objective weakness. 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  Right upper quadrant ultrasound showed cholelithiasis without evidence of cholecystitis or masses.  There was some fluid around the gallbladder, no CBD dilation. --Outpatient follow-up with GI, general surgery recommended  Coronary artery disease -chronic, stable --Continue aspirin, Lipitor, Coreg  Hypertension -continue Coreg, lisinopril, as needed labetalol or hydralazine if uncontrolled  Hyperlipidemia -continue Lipitor   Lack of medical insurance -TOC is following and assisting with disposition   DVT prophylaxis: heparin injection 5,000 Units Start: 09/27/20 2315   Diet:  Diet Orders (From admission, onward)    Start     Ordered   10/11/20 0800  Diet NPO time specified  Diet effective ____        10/09/20 1322            Code Status: Full  Code    Subjective 10/11/20    Patient seen this morning and reports feeling overall fine.  Complains of bilateral carpal tunnel syndrome symptoms that involve  all the fingers.  He says the left hand is worse than the right.  It is a little better in the evening because he takes gabapentin.  He has no other acute complaints.   Disposition Plan & Communication   Status is: Inpatient  Remains inpatient appropriate because:Inpatient level of care appropriate due to severity of illness, on IV antibiotics and undergoing repeat surgery for right foot as above   Dispo: The patient is from: Home              Anticipated d/c is to: Home              Anticipated d/c date is: TBD              Patient currently is not medically stable to d/c.   Difficult to place patient Yes    Consults, Procedures, Significant Events   Consultants:   Infectious disease  Podiatry  Vascular surgery  General surgery  Procedures:   Partial fifth ray amputation, I&D -2/13  Abdominal aortogram and RLE runoff with third order catheter placement -2/15    Antimicrobials:  Anti-infectives (From admission, onward)   Start     Dose/Rate Route Frequency Ordered Stop   10/06/20 0600  ceFAZolin (ANCEF) IVPB 2g/100 mL premix        2 g 200 mL/hr over 30 Minutes Intravenous On call to O.R. 10/05/20 2241 10/07/20 0559   10/03/20 1045  ceFAZolin (ANCEF) IVPB 1 g/50 mL premix  Status:  Discontinued       Note to Pharmacy: To be given in specials   1 g 100 mL/hr over 30 Minutes Intravenous  Once 10/03/20 0956 10/03/20 1001   10/03/20 1045  ceFAZolin (ANCEF) IVPB 2g/100 mL premix       Note to Pharmacy: To be given in specials   2 g 200 mL/hr over 30 Minutes Intravenous  Once 10/03/20 1001 10/03/20 1245   10/02/20 1800  Ampicillin-Sulbactam (UNASYN) 3 g in sodium chloride 0.9 % 100 mL IVPB        3 g 200 mL/hr over 30 Minutes Intravenous Every 6 hours 10/02/20 1628     09/28/20 1800  vancomycin (VANCOREADY) IVPB 1500 mg/300 mL  Status:  Discontinued        1,500 mg 150 mL/hr over 120 Minutes Intravenous Every 12 hours 09/28/20 0915 09/28/20 1553   09/28/20 1800   linezolid (ZYVOX) IVPB 600 mg  Status:  Discontinued        600 mg 300 mL/hr over 60 Minutes Intravenous Every 12 hours 09/28/20 1553 10/02/20 1625   09/28/20 1700  piperacillin-tazobactam (ZOSYN) IVPB 3.375 g  Status:  Discontinued        3.375 g 12.5 mL/hr over 240 Minutes Intravenous Every 8 hours 09/28/20 1553 10/02/20 1624   09/28/20 0600  ceFEPIme (MAXIPIME) 2 g in sodium chloride 0.9 % 100 mL IVPB  Status:  Discontinued        2 g 200 mL/hr over 30 Minutes Intravenous Every 8 hours 09/28/20 0216 09/28/20 1553   09/28/20 0500  vancomycin (VANCOREADY) IVPB 1750 mg/350 mL  Status:  Discontinued        1,750 mg 175 mL/hr over 120 Minutes Intravenous Every 12 hours 09/28/20 0222 09/28/20 0915   09/28/20 0200  metroNIDAZOLE (FLAGYL) IVPB 500 mg  Status:  Discontinued        500 mg 100 mL/hr over 60 Minutes Intravenous Every 8 hours 09/27/20 2320 09/28/20 1552   09/27/20 2130  ceFEPIme (MAXIPIME) 2 g in sodium chloride 0.9 % 100 mL IVPB        2 g 200 mL/hr over 30 Minutes Intravenous  Once 09/27/20 2120 09/27/20 2310   09/27/20 2130  metroNIDAZOLE (FLAGYL) IVPB 500 mg  Status:  Discontinued        500 mg 100 mL/hr over 60 Minutes Intravenous  Once 09/27/20 2120 09/28/20 0122   09/27/20 2130  vancomycin (VANCOCIN) IVPB 1000 mg/200 mL premix        1,000 mg 200 mL/hr over 60 Minutes Intravenous  Once 09/27/20 2120 09/28/20 0106        Micro    Objective   Vitals:   10/10/20 2044 10/10/20 2351 10/11/20 0418 10/11/20 0743  BP: 127/84 123/78 115/75 103/61  Pulse: 72 72 66 67  Resp: 18 16 15 18   Temp: 98.1 F (36.7 C) 98.3 F (36.8 C) 98.2 F (36.8 C) 97.8 F (36.6 C)  TempSrc:    Oral  SpO2: 99% 100% 98% 98%  Weight:      Height:        Intake/Output Summary (Last 24 hours) at 10/11/2020 0750 Last data filed at 10/11/2020 0636 Gross per 24 hour  Intake 480 ml  Output 1550 ml  Net -1070 ml   Filed Weights   09/27/20 1859 09/29/20 1000  Weight: 104.3 kg 104.3 kg     Physical Exam:  General exam: awake, alert, no acute distress HEENT: moist mucus membranes, hearing grossly normal  Respiratory system: CTAB, no wheezes, rales or rhonchi, normal respiratory effort. Cardiovascular system: normal S1/S2, RRR, no pedal edema.   Gastrointestinal system: soft, NT, ND, +bowel sounds. Central nervous system: A&O x3. no gross focal neurologic deficits, normal speech Extremities: moves all, no edema, normal tone, right foot in dressing with distal sensation intact, no thenar atrophy of either hand, bilateral hand intrinsic motor function equal and intact. Psychiatry: normal mood, congruent affect, judgement and insight appear normal  Labs   Data Reviewed: I have personally reviewed following labs and imaging studies  CBC: Recent Labs  Lab 10/05/20 0513 10/06/20 0529 10/07/20 0350 10/08/20 0348 10/09/20 0436  WBC 9.2 11.3* 11.1* 9.3 11.4*  NEUTROABS 5.9 7.3 7.0 5.4 6.6  HGB 12.3* 12.6* 12.6* 12.6* 12.8*  HCT 36.8* 37.1* 37.6* 37.0* 38.7*  MCV 96.3 96.4 96.4 95.9 96.8  PLT 293 315 354 321 161   Basic Metabolic Panel: Recent Labs  Lab 10/05/20 0513 10/06/20 0529 10/07/20 0350 10/08/20 0348 10/09/20 0436  NA 134* 134* 132* 132* 134*  K 4.1 3.8 3.7 3.9 4.2  CL 97* 99 98 99 98  CO2 28 26 28 26 28   GLUCOSE 241* 212* 184* 179* 187*  BUN 13 13 14 17 19   CREATININE 0.83 0.91 0.80 1.01 0.98  CALCIUM 8.6* 8.7* 8.3* 8.5* 9.0  MG 1.8 1.8 1.6* 1.7 1.8  PHOS 3.9 3.8 3.8 3.8 3.6   GFR: Estimated Creatinine Clearance: 116.1 mL/min (by C-G formula based on SCr of 0.98 mg/dL). Liver Function Tests: Recent Labs  Lab 10/05/20 0513 10/06/20 0529 10/07/20 0350 10/08/20 0348 10/09/20 0436  AST 35 32 37 32 35  ALT 28 26 27 25 26   ALKPHOS 263* 259* 255* 227* 235*  BILITOT 0.7 0.6 0.8 0.8 0.6  PROT 7.2 7.4 7.5 7.3 8.0  ALBUMIN 2.0* 2.1* 2.2*  2.2* 2.4*   No results for input(s): LIPASE, AMYLASE in the last 168 hours. No results for input(s): AMMONIA  in the last 168 hours. Coagulation Profile: No results for input(s): INR, PROTIME in the last 168 hours. Cardiac Enzymes: No results for input(s): CKTOTAL, CKMB, CKMBINDEX, TROPONINI in the last 168 hours. BNP (last 3 results) No results for input(s): PROBNP in the last 8760 hours. HbA1C: No results for input(s): HGBA1C in the last 72 hours. CBG: Recent Labs  Lab 10/09/20 2052 10/10/20 0830 10/10/20 1147 10/10/20 1608 10/10/20 2119  GLUCAP 177* 196* 281* 178* 276*   Lipid Profile: No results for input(s): CHOL, HDL, LDLCALC, TRIG, CHOLHDL, LDLDIRECT in the last 72 hours. Thyroid Function Tests: No results for input(s): TSH, T4TOTAL, FREET4, T3FREE, THYROIDAB in the last 72 hours. Anemia Panel: No results for input(s): VITAMINB12, FOLATE, FERRITIN, TIBC, IRON, RETICCTPCT in the last 72 hours. Sepsis Labs: No results for input(s): PROCALCITON, LATICACIDVEN in the last 168 hours.  No results found for this or any previous visit (from the past 240 hour(s)).    Imaging Studies   DG Foot 2 Views Right  Result Date: 10/09/2020 CLINICAL DATA:  Status post amputation of the fifth ray today. EXAM: RIGHT FOOT - 2 VIEW COMPARISON:  Plain films of the right foot 09/27/2020. FINDINGS: The patient is status post amputation at the level of the proximal diaphysis of the fifth metatarsal. Surgical wound is noted and bandaging is in place. No acute abnormality. Talonavicular osteoarthritis noted. Antibiotic impregnated beads are seen in the plantar soft tissues at the surgical site. IMPRESSION: Postoperative change as described.  No acute finding. Electronically Signed   By: Inge Rise M.D.   On: 10/09/2020 14:14     Medications   Scheduled Meds: . aspirin EC  81 mg Oral Daily  . atorvastatin  40 mg Oral Daily  . carvedilol  3.125 mg Oral BID WC  . chlorhexidine  60 mL Topical Once  . fluticasone  2 spray Each Nare Daily  . gabapentin  600 mg Oral QHS  . heparin  5,000 Units  Subcutaneous Q8H  . insulin aspart  0-15 Units Subcutaneous TID WC  . insulin aspart  0-5 Units Subcutaneous QHS  . insulin aspart  8 Units Subcutaneous TID WC  . insulin glargine  30 Units Subcutaneous Daily  . lisinopril  5 mg Oral Daily  . melatonin  5 mg Oral QHS  . povidone-iodine  2 application Topical Once  . povidone-iodine  2 application Topical Once  . sodium chloride flush  3 mL Intravenous Q12H   Continuous Infusions: . sodium chloride    . ampicillin-sulbactam (UNASYN) IV 3 g (10/11/20 7026)       LOS: 13 days    Time spent: 30 minutes    Ezekiel Slocumb, DO [facility]  10/11/2020, 7:50 AM      If 7PM-7AM, please contact night-coverage. How to contact the [facility] Attending or Consulting provider [place] or covering provider during after hours [place], for this patient?    1. Check the care team in [facility] and look for a) attending/consulting [facility] provider listed and b) the [facility] team listed 2. Log into [email or web address] and use [place]'s universal password to access. If you do not have the password, please contact the hospital operator. 3. Locate the [facility] provider you are looking for under [facility] and page to a number that you can be directly reached. 4. If you still have difficulty reaching  the provider, please page the [facility] (Director on Call) for the Hospitalists listed on amion for assistance.

## 2020-10-11 NOTE — Anesthesia Preprocedure Evaluation (Signed)
Anesthesia Evaluation  Patient identified by MRN, date of birth, ID band Patient awake    Reviewed: Allergy & Precautions, H&P , NPO status , Patient's Chart, lab work & pertinent test results  History of Anesthesia Complications Negative for: history of anesthetic complications  Airway Mallampati: II  TM Distance: >3 FB Neck ROM: full  Mouth opening: Limited Mouth Opening Comment: Large beard  Dental  (+) Edentulous Upper, Edentulous Lower   Pulmonary neg sleep apnea, neg COPD, Current Smoker and Patient abstained from smoking.,    Pulmonary exam normal        Cardiovascular hypertension, (-) angina+ CAD and + Past MI  (-) Cardiac Stents Normal cardiovascular exam(-) dysrhythmias      Neuro/Psych negative neurological ROS  negative psych ROS   GI/Hepatic negative GI ROS, Neg liver ROS,   Endo/Other  diabetes, Poorly Controlled  Renal/GU negative Renal ROS  negative genitourinary   Musculoskeletal   Abdominal Normal abdominal exam  (+)   Peds negative pediatric ROS (+)  Hematology negative hematology ROS (+)   Anesthesia Other Findings Past Medical History: No date: Diabetes mellitus without complication (HCC) No date: Heart disease No date: Myocardial infarction ([place])  Reproductive/Obstetrics negative OB ROS                             Anesthesia Physical  Anesthesia Plan  ASA: III  Anesthesia Plan: General   Post-op Pain Management:    Induction:   PONV Risk Score and Plan: Ondansetron, Dexamethasone, Midazolam and Treatment may vary due to age or medical condition  Airway Management Planned: LMA  Additional Equipment:   Intra-op Plan:   Post-operative Plan: Extubation in OR  Informed Consent: I have reviewed the patients History and Physical, chart, labs and discussed the procedure including the risks, benefits and alternatives for the proposed anesthesia with the  patient or authorized representative who has indicated his/her understanding and acceptance.     Dental advisory given  Plan Discussed with: CRNA  Anesthesia Plan Comments:         Anesthesia Quick Evaluation

## 2020-10-11 NOTE — Hospital Course (Addendum)
[name] is a 50 y.o. male with medical history significant for CAD, hypertension, hyperlipidemia, diabetes mellitus, right diabetic foot ulcer.  He presented to the hospital because of pain, swelling and redness of the right foot with increasing drainage from the right foot ulcer.  He reported falling off his truck and sustaining a cut to his right foot after slipping on ice.  This happened a few days prior to admission   He was admitted to the hospital for sepsis secondary to right foot cellulitis with abscess and osteomyelitis.  He was treated with empiric IV antibiotics, IV fluids and analgesics.  He was seen in consultation by the podiatrist.  He underwent partial fifth ray amputation, incision and drainage of the right foot with placement of antibiotic beads on 10/01/2020.  He was also evaluated by the vascular surgeon and he underwent abdominal aortogram and right lower extremity distal runoff third order catheter placement on 10/03/2020.  No hemodynamically significant stenosis was identified.  On 2/23, podiatry took patient back to the OR for right transmetatarsal imitation, Achilles lengthening and rotational skin flap closure.   He has poorly controlled type 2 diabetes mellitus.  Hemoglobin A1c was 11.2%. He was started on Lantus on NovoLog.

## 2020-10-11 NOTE — Anesthesia Procedure Notes (Signed)
Procedure Name: LMA Insertion Date/Time: 10/11/2020 5:43 PM Performed by: Jonna Clark, CRNA Pre-anesthesia Checklist: Patient identified, Patient being monitored, Timeout performed, Emergency Drugs available and Suction available Patient Re-evaluated:Patient Re-evaluated prior to induction Oxygen Delivery Method: Circle system utilized Preoxygenation: Pre-oxygenation with 100% oxygen Induction Type: IV induction Ventilation: Mask ventilation without difficulty LMA: LMA inserted LMA Size: 4.5 Tube type: Oral Number of attempts: 1 Placement Confirmation: positive ETCO2 and breath sounds checked- equal and bilateral Tube secured with: Tape Dental Injury: Teeth and Oropharynx as per pre-operative assessment

## 2020-10-11 NOTE — Transfer of Care (Signed)
Immediate Anesthesia Transfer of Care Note  Patient: [name]  Procedure(s) Performed: AMPUTATION RAY (Right Foot) TENDON LENGTHENING (Right )  Patient Location: PACU  Anesthesia Type:General  Level of Consciousness: drowsy and patient cooperative  Airway & Oxygen Therapy: Patient Spontanous Breathing and Patient connected to face mask oxygen  Post-op Assessment: Report given to RN and Post -op Vital signs reviewed and stable  Post vital signs: Reviewed and stable  Last Vitals:  Vitals Value Taken Time  BP 109/72 10/11/20 2002  Temp    Pulse 72 10/11/20 2002  Resp 18 10/11/20 2002  SpO2 100 % 10/11/20 2002  Vitals shown include unvalidated device data.  Last Pain:  Vitals:   10/11/20 1143  TempSrc: Oral  PainSc:       Patients Stated Pain Goal: 0 (33/43/56 8616)  Complications: No complications documented.

## 2020-10-12 ENCOUNTER — Inpatient Hospital Stay: Payer: Self-pay

## 2020-10-12 ENCOUNTER — Encounter: Payer: Self-pay | Admitting: Podiatry

## 2020-10-12 DIAGNOSIS — E1152 Type 2 diabetes mellitus with diabetic peripheral angiopathy with gangrene: Secondary | ICD-10-CM

## 2020-10-12 DIAGNOSIS — K7460 Unspecified cirrhosis of liver: Secondary | ICD-10-CM

## 2020-10-12 DIAGNOSIS — Z89421 Acquired absence of other right toe(s): Secondary | ICD-10-CM

## 2020-10-12 LAB — COMPREHENSIVE METABOLIC PANEL
ALT: 25 U/L (ref 0–44)
AST: 36 U/L (ref 15–41)
Albumin: 2.5 g/dL — ABNORMAL LOW (ref 3.5–5.0)
Alkaline Phosphatase: 183 U/L — ABNORMAL HIGH (ref 38–126)
Anion gap: 8 (ref 5–15)
BUN: 19 mg/dL (ref 6–20)
CO2: 26 mmol/L (ref 22–32)
Calcium: 8.8 mg/dL — ABNORMAL LOW (ref 8.9–10.3)
Chloride: 100 mmol/L (ref 98–111)
Creatinine, Ser: 0.96 mg/dL (ref 0.61–1.24)
GFR, Estimated: >60 mL/min (ref >60)
Glucose, Bld: 295 mg/dL — ABNORMAL HIGH (ref 70–99)
Potassium: 4.8 mmol/L (ref 3.5–5.1)
Sodium: 134 mmol/L — ABNORMAL LOW (ref 135–145)
Total Bilirubin: 0.8 mg/dL (ref 0.3–1.2)
Total Protein: 8 g/dL (ref 6.5–8.1)

## 2020-10-12 LAB — CBC
HCT: 38.5 % — ABNORMAL LOW (ref 39.0–52.0)
Hemoglobin: 13.4 g/dL (ref 13.0–17.0)
MCH: 33 pg (ref 26.0–34.0)
MCHC: 34.8 g/dL (ref 30.0–36.0)
MCV: 94.8 fL (ref 80.0–100.0)
Platelets: 336 10*3/uL (ref 150–400)
RBC: 4.06 MIL/uL — ABNORMAL LOW (ref 4.22–5.81)
RDW: 12.5 % (ref 11.5–15.5)
WBC: 13.9 10*3/uL — ABNORMAL HIGH (ref 4.0–10.5)
nRBC: 0 % (ref 0.0–0.2)

## 2020-10-12 LAB — GLUCOSE, CAPILLARY
Glucose-Capillary: 213 mg/dL — ABNORMAL HIGH (ref 70–99)
Glucose-Capillary: 274 mg/dL — ABNORMAL HIGH (ref 70–99)
Glucose-Capillary: 191 mg/dL — ABNORMAL HIGH (ref 70–99)
Glucose-Capillary: 158 mg/dL — ABNORMAL HIGH (ref 70–99)

## 2020-10-12 LAB — MAGNESIUM: Magnesium: 1.7 mg/dL (ref 1.7–2.4)

## 2020-10-12 MED ORDER — METHOCARBAMOL 750 MG PO TABS
750.0000 mg | ORAL_TABLET | Freq: Four times a day (QID) | ORAL | Status: DC | PRN
  Administered 2020-10-12 – 2020-10-13 (×3): 750 mg via ORAL
  Filled 2020-10-12 (×4): qty 1

## 2020-10-12 MED ORDER — METHOCARBAMOL 500 MG PO TABS
500.0000 mg | ORAL_TABLET | Freq: Three times a day (TID) | ORAL | Status: DC | PRN
  Administered 2020-10-12: 16:00:00 500 mg via ORAL
  Filled 2020-10-12 (×3): qty 1

## 2020-10-12 MED ORDER — MAGNESIUM OXIDE 400 (241.3 MG) MG PO TABS
400.0000 mg | ORAL_TABLET | Freq: Once | ORAL | Status: AC
  Administered 2020-10-12: 10:00:00 400 mg via ORAL
  Filled 2020-10-12: qty 1

## 2020-10-12 NOTE — Progress Notes (Signed)
OT Cancellation Note  Patient Details Name: [name] MRN: [id number] DOB: [birth year]   Cancelled Treatment:    Reason Eval/Treat Not Completed: Patient declined, no reason specified . New surgical procedure on 2/23 with OT attempting to see pt for re-evaluation. Pt refusal this session secondary to spasms in R LE. OT providing education for participation, discussed new CAM boot, checked it for proper fit, and verbalized NWB precautions. Pt continues to decline OT intervention. OT to re-attempt at next available time.   Darleen Crocker, MS, OTR/L , CBIS ascom [phone]  10/12/20, 4:18 PM   10/12/2020, 4:18 PM

## 2020-10-12 NOTE — Progress Notes (Signed)
PT Cancellation Note  Patient Details Name: [name] MRN: [id number] DOB: 10-02-1970   Cancelled Treatment:    Reason Eval/Treat Not Completed: Pain limiting ability to participate;Other (comment) (Pt refuses at this time, reports to have recieved morphine for pain, but still having muslce cramps in operative limb. Pt encouraged to participate in contrlateral leg strengthening, but he is not persuadable despite compelling point of encouragement.)   Daneesha Quinteros C 10/12/2020, 11:18 AM

## 2020-10-12 NOTE — Progress Notes (Signed)
Daily Progress Note   Subjective  - 1 Day Post-Op  F/u TMA Right.  Objective Vitals:   10/11/20 2356 10/12/20 0451 10/12/20 0737 10/12/20 1201  BP: 123/73 122/69 111/71 (!) 96/59  Pulse: 76 80 79 86  Resp: 19 18 16 16   Temp: 99.1 F (37.3 C) 98.7 F (37.1 C) 98 F (36.7 C) 97.8 F (36.6 C)  TempSrc:      SpO2: 99% 96% 96% 99%  Weight:      Height:        Physical Exam: F/u right TMA.  Some cramping to leg.    Incsion intact.          Laboratory CBC    Component Value Date/Time   WBC 13.9 (H) 10/12/2020 0308   HGB 13.4 10/12/2020 0308   HCT 38.5 (L) 10/12/2020 0308   PLT 336 10/12/2020 0308    BMET    Component Value Date/Time   NA 134 (L) 10/12/2020 0308   K 4.8 10/12/2020 0308   CL 100 10/12/2020 0308   CO2 26 10/12/2020 0308   GLUCOSE 295 (H) 10/12/2020 0308   BUN 19 10/12/2020 0308   CREATININE 0.96 10/12/2020 0308   CREATININE 0.98 04/13/2015 1440   CALCIUM 8.8 (L) 10/12/2020 0308   GFRNONAA >60 10/12/2020 0308   GFRAA >60 01/25/2017 1755    Assessment/Planning: S/p TMA for severe infection   Incsion looks good.  Good perfusion to skin flap.  C/W NWB to foot.  Suspect once medically stable can be d/c'd.  Dressing changed today.  Awaiting IV orders from ID.  PT continuing to follow.   Samara Deist A  10/12/2020, 12:37 PM

## 2020-10-12 NOTE — Progress Notes (Signed)
PROGRESS NOTE    [name]   ZJQ:[id number]  DOB: December 14, 1970  PCP: Coral Spikes, DO    DOA: 09/27/2020 LOS: 53   Brief Narrative   [name] is a 50 y.o. male with medical history significant for CAD, hypertension, hyperlipidemia, diabetes mellitus, right diabetic foot ulcer.  He presented to the hospital because of pain, swelling and redness of the right foot with increasing drainage from the right foot ulcer.  He reported falling off his truck and sustaining a cut to his right foot after slipping on ice.  This happened a few days prior to admission   He was admitted to the hospital for sepsis secondary to right foot cellulitis with abscess and osteomyelitis.  He was treated with empiric IV antibiotics, IV fluids and analgesics.  He was seen in consultation by the podiatrist.  He underwent partial fifth ray amputation, incision and drainage of the right foot with placement of antibiotic beads on 10/01/2020.  He was also evaluated by the vascular surgeon and he underwent abdominal aortogram and right lower extremity distal runoff third order catheter placement on 10/03/2020.  No hemodynamically significant stenosis was identified.  On 2/23, podiatry took patient back to the OR for right transmetatarsal imitation, Achilles lengthening and rotational skin flap closure.   He has poorly controlled type 2 diabetes mellitus.  Hemoglobin A1c was 11.2%. He was started on Lantus on NovoLog.    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 --Podiatry, infectious disease are consulted --Continue Unasyn, will need long course  Type 2 diabetes with hyperglycemia, peripheral neuropathy-uncontrolled, hemoglobin A1c 11.2%.   Home regimen appears to be Metformin, glipizide, Jardiance --Continue Lantus, NovoLog --Titrate insulin for inpatient goal 140-180 --Continue gabapentin   Leg spasms -patient reported 2/24.  Somewhat improved with oral Mag-Ox.   --trial Robaxin as needed  Sinus headaches, sinusitis -improved with Flonase.  Analgesics as needed for headache.  Carpal tunnel syndrome -patient complains of bilateral carpal tunnel symptoms, left greater than right, involving all fingers.  No elbow pain.  No sign of atrophy or objective weakness. 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  Right upper quadrant ultrasound showed cholelithiasis without evidence of cholecystitis or masses.  There was some fluid around the gallbladder, no CBD dilation. --Outpatient follow-up with GI, general surgery recommended  Coronary artery disease -chronic, stable --Continue aspirin, Lipitor, Coreg  Hypertension -continue Coreg, lisinopril, as needed labetalol or hydralazine if uncontrolled  Hyperlipidemia -continue Lipitor   Lack of medical insurance -TOC is following and assisting with disposition   DVT prophylaxis:    Diet:  Diet Orders (From admission, onward)    Start     Ordered   10/11/20 2218  Diet heart healthy/carb modified  Room service appropriate? Yes; Fluid consistency: Thin  Diet  effective now       Question Answer Comment  Diet-HS Snack? Nothing   Room service appropriate? Yes   Fluid consistency: Thin      10/11/20 2217            Code Status: Full Code    Subjective 10/12/20    Patient resting in bed this AM.  Was having leg cramps/spasms, oral magnesium given earlier helped to ease it off.  Denies any other acute complaints including fevers, chills, nausea vomiting, cough shortness of breath or chest pain.   Disposition Plan & Communication   Status is: Inpatient  Remains inpatient appropriate because:Inpatient level of care appropriate due to severity of illness, on IV antibiotics and will require long course IV antibiotics, will require home health services.     Dispo: The patient is from: Home              Anticipated d/c is to: Home              Anticipated d/c date is: TBD              Patient currently is not medically stable to d/c.   Difficult to place patient Yes    Consults, Procedures, Significant Events   Consultants:   Infectious disease  Podiatry  Vascular surgery  General surgery  Procedures:   2/13: Partial fifth ray amputation, I&D  2/15: Abdominal aortogram and RLE runoff with third order catheter placement   2/23: Right foot revision surgery with skin flap closure    Antimicrobials:  Anti-infectives (From admission, onward)   Start     Dose/Rate Route Frequency Ordered Stop   10/06/20 0600  ceFAZolin (ANCEF) IVPB 2g/100 mL premix        2 g 200 mL/hr over 30 Minutes Intravenous On call to O.R. 10/05/20 2241 10/07/20 0559   10/03/20 1045  ceFAZolin (ANCEF) IVPB 1 g/50 mL premix  Status:  Discontinued       Note to Pharmacy: To be given in specials   1 g 100 mL/hr over 30 Minutes Intravenous  Once 10/03/20 0956 10/03/20 1001   10/03/20 1045  ceFAZolin (ANCEF) IVPB 2g/100 mL premix       Note to Pharmacy: To be given in specials   2 g 200 mL/hr over 30 Minutes Intravenous  Once 10/03/20 1001 10/03/20  1245   10/02/20 1800  Ampicillin-Sulbactam (UNASYN) 3 g in sodium chloride 0.9 % 100 mL IVPB        3 g 200 mL/hr over 30 Minutes Intravenous Every 6 hours 10/02/20 1628     09/28/20 1800  vancomycin (VANCOREADY) IVPB 1500 mg/300 mL  Status:  Discontinued        1,500 mg 150 mL/hr over 120 Minutes Intravenous Every 12 hours 09/28/20 0915 09/28/20 1553   09/28/20 1800  linezolid (ZYVOX) IVPB 600 mg  Status:  Discontinued        600 mg 300 mL/hr over 60 Minutes Intravenous Every 12 hours 09/28/20 1553 10/02/20 1625   09/28/20 1700  piperacillin-tazobactam (ZOSYN) IVPB 3.375 g  Status:  Discontinued        3.375 g 12.5 mL/hr over 240 Minutes Intravenous Every 8 hours 09/28/20 1553 10/02/20 1624   09/28/20 0600  ceFEPIme (MAXIPIME) 2 g in sodium chloride 0.9 % 100 mL IVPB  Status:  Discontinued        2 g 200 mL/hr over 30 Minutes Intravenous  Every 8 hours 09/28/20 0216 09/28/20 1553   09/28/20 0500  vancomycin (VANCOREADY) IVPB 1750 mg/350 mL  Status:  Discontinued        1,750 mg 175 mL/hr over 120 Minutes Intravenous Every 12 hours 09/28/20 0222 09/28/20 0915   09/28/20 0200  metroNIDAZOLE (FLAGYL) IVPB 500 mg  Status:  Discontinued        500 mg 100 mL/hr over 60 Minutes Intravenous Every 8 hours 09/27/20 2320 09/28/20 1552   09/27/20 2130  ceFEPIme (MAXIPIME) 2 g in sodium chloride 0.9 % 100 mL IVPB        2 g 200 mL/hr over 30 Minutes Intravenous  Once 09/27/20 2120 09/27/20 2310   09/27/20 2130  metroNIDAZOLE (FLAGYL) IVPB 500 mg  Status:  Discontinued        500 mg 100 mL/hr over 60 Minutes Intravenous  Once 09/27/20 2120 09/28/20 0122   09/27/20 2130  vancomycin (VANCOCIN) IVPB 1000 mg/200 mL premix        1,000 mg 200 mL/hr over 60 Minutes Intravenous  Once 09/27/20 2120 09/28/20 0106        Micro   2/11 Blood culture - + group B strep (S. Agalactiae)  Objective   Vitals:   10/11/20 2356 10/12/20 0451 10/12/20 0737 10/12/20 1201  BP: 123/73 122/69 111/71 (!) 96/59   Pulse: 76 80 79 86  Resp: _0 Temp: 99.1 F (37.3 C) 98.7 F (37.1 C) 98 F (36.7 C) 97.8 F (36.6 C)  TempSrc:      SpO2: 99% 96% 96% 99%  Weight:      Height:        Intake/Output Summary (Last 24 hours) at 10/12/2020 1345 Last data filed at 10/12/2020 0716 Gross per 24 hour  Intake 700 ml  Output 350 ml  Net 350 ml   Filed Weights   09/27/20 1859 09/29/20 1000 10/11/20 1641  Weight: 104.3 kg 104.3 kg 104.3 kg    Physical Exam:  General exam: awake, alert, no acute distress Respiratory system: CTAB, normal respiratory effort, on room air. Cardiovascular system: normal S1/S2, RRR, no pedal edema.   Central nervous system: A&O x3. no gross focal neurologic deficits, normal speech Extremities: moves all, no cyanosis, no peripheral edema   Labs   Data Reviewed: I have personally reviewed following labs and imaging studies  CBC: Recent Labs  Lab 10/06/20 0529 10/07/20 0350 10/08/20 0348 10/09/20 0436 10/12/20 0308  WBC 11.3* 11.1* 9.3 11.4* 13.9*  NEUTROABS 7.3 7.0 5.4 6.6  --   HGB 12.6* 12.6* 12.6* 12.8* 13.4  HCT 37.1* 37.6* 37.0* 38.7* 38.5*  MCV 96.4 96.4 95.9 96.8 94.8  PLT 315 354 321 381 353   Basic Metabolic Panel: Recent Labs  Lab 10/06/20 0529 10/07/20 0350 10/08/20 0348 10/09/20 0436 10/12/20 0308  NA 134* 132* 132* 134* 134*  K 3.8 3.7 3.9 4.2 4.8  CL 99 98 99 98 100  CO2 _1 GLUCOSE 212* 184* 179* 187* 295*  BUN _2 CREATININE 0.91 0.80 1.01 0.98 0.96  CALCIUM 8.7* 8.3* 8.5* 9.0 8.8*  MG 1.8 1.6* 1.7 1.8 1.7  PHOS 3.8 3.8 3.8 3.6  --    GFR: Estimated Creatinine Clearance: 118.5 mL/min (by C-G formula based on SCr of 0.96 mg/dL). Liver Function Tests: Recent Labs  Lab 10/06/20 0529 10/07/20 0350 10/08/20 0348 10/09/20 0436 10/12/20 0308  AST 32 37 32 35 36  ALT 26 27  _0 ALKPHOS 259* 255* 227* 235* 183*  BILITOT 0.6 0.8 0.8 0.6 0.8  PROT 7.4 7.5 7.3 8.0 8.0  ALBUMIN 2.1* 2.2* 2.2*  2.4* 2.5*   No results for input(s): LIPASE, AMYLASE in the last 168 hours. No results for input(s): AMMONIA in the last 168 hours. Coagulation Profile: No results for input(s): INR, PROTIME in the last 168 hours. Cardiac Enzymes: No results for input(s): CKTOTAL, CKMB, CKMBINDEX, TROPONINI in the last 168 hours. BNP (last 3 results) No results for input(s): PROBNP in the last 8760 hours. HbA1C: No results for input(s): HGBA1C in the last 72 hours. CBG: Recent Labs  Lab 10/11/20 1643 10/11/20 2002 10/11/20 2156 10/12/20 0802 10/12/20 1150  GLUCAP 94 115* 170* 213* 274*   Lipid Profile: No results for input(s): CHOL, HDL, LDLCALC, TRIG, CHOLHDL, LDLDIRECT in the last 72 hours. Thyroid Function Tests: No results for input(s): TSH, T4TOTAL, FREET4, T3FREE, THYROIDAB in the last 72 hours. Anemia Panel: No results for input(s): VITAMINB12, FOLATE, FERRITIN, TIBC, IRON, RETICCTPCT in the last 72 hours. Sepsis Labs: No results for input(s): PROCALCITON, LATICACIDVEN in the last 168 hours.  No results found for this or any previous visit (from the past 240 hour(s)).    Imaging Studies   DG MINI C-ARM IMAGE ONLY  Result Date: 10/11/2020 There is no interpretation for this exam.  This order is for images obtained during a surgical procedure.  Please See "Surgeries" Tab for more information regarding the procedure.     Medications   Scheduled Meds: . atorvastatin  40 mg Oral Daily  . carvedilol  3.125 mg Oral BID WC  . fluticasone  2 spray Each Nare Daily  . gabapentin  300 mg Oral Daily  . gabapentin  600 mg Oral QHS  . insulin aspart  0-15 Units Subcutaneous TID WC  . insulin aspart  0-5 Units Subcutaneous QHS  . insulin aspart  8 Units Subcutaneous TID WC  . insulin glargine  30 Units Subcutaneous Daily  . lisinopril  5 mg Oral Daily  . melatonin  5 mg Oral QHS  . sodium chloride flush  3 mL Intravenous Q12H   Continuous Infusions: . sodium chloride    . sodium  chloride 10 mL/hr at 10/11/20 1659  . ampicillin-sulbactam (UNASYN) IV 3 g (10/12/20 1213)       LOS: 14 days    Time spent: 25 minutes with >50% spent at bedside and in coordination of care.    Ezekiel Slocumb, DO [facility]  10/12/2020, 1:45 PM      If 7PM-7AM, please contact night-coverage. How to contact the [facility] Attending or Consulting provider [place] or covering provider during after hours [place], for this patient?    1. Check the care team in [facility] and look for a) attending/consulting [facility] provider listed and b) the [facility] team listed 2. Log into [email or web address] and use [place]'s universal password to access. If you do not have the password, please contact the hospital operator. 3. Locate the [facility] provider you are looking for under [facility] and page to a number that you can be directly reached. 4. If you still have difficulty reaching the provider, please page the [facility] (Director on Call) for the Hospitalists listed on amion for assistance.

## 2020-10-12 NOTE — Progress Notes (Signed)
ID  Pt underwent TMA yesterday Says he has foot cramps  Patient Vitals for the past 24 hrs:  BP Temp Temp src Pulse Resp SpO2  10/12/20 2020 107/66 99.1 F (37.3 C) Oral 84 18 99 %  10/12/20 1624 109/70 97.7 F (36.5 C) Oral 81 18 100 %  10/12/20 1201 (!) 96/59 97.8 F (36.6 C) -- 86 16 99 %  10/12/20 0737 111/71 98 F (36.7 C) -- 79 16 96 %  10/12/20 0451 122/69 98.7 F (37.1 C) -- 80 18 96 %  10/11/20 2356 123/73 99.1 F (37.3 C) -- 76 19 99 %  10/11/20 2119 126/83 98.5 F (36.9 C) -- 75 18 95 %  10/11/20 2027 -- (!) 97.4 F (36.3 C) -- -- 14 --   Awake, flat affect, pale Chest b/l air entry Hss1s2 abd soft     Labs CBC Latest Ref Rng & Units 10/12/2020 10/09/2020 10/08/2020  WBC 4.0 - 10.5 K/uL 13.9(H) 11.4(H) 9.3  Hemoglobin 13.0 - 17.0 g/dL 13.4 12.8(L) 12.6(L)  Hematocrit 39.0 - 52.0 % 38.5(L) 38.7(L) 37.0(L)  Platelets 150 - 400 K/uL 336 381 321   CMP Latest Ref Rng & Units 10/12/2020 10/09/2020 10/08/2020  Glucose 70 - 99 mg/dL 295(H) 187(H) 179(H)  BUN 6 - 20 mg/dL 19 19 17   Creatinine 0.61 - 1.24 mg/dL 0.96 0.98 1.01  Sodium 135 - 145 mmol/L 134(L) 134(L) 132(L)  Potassium 3.5 - 5.1 mmol/L 4.8 4.2 3.9  Chloride 98 - 111 mmol/L 100 98 99  CO2 22 - 32 mmol/L 26 28 26   Calcium 8.9 - 10.3 mg/dL 8.8(L) 9.0 8.5(L)  Total Protein 6.5 - 8.1 g/dL 8.0 8.0 7.3  Total Bilirubin 0.3 - 1.2 mg/dL 0.8 0.6 0.8  Alkaline Phos 38 - 126 U/L 183(H) 235(H) 227(H)  AST 15 - 41 U/L 36 35 32  ALT 0 - 44 U/L 25 26 25    Micro Group B streptococcus in the wound culture Blood culture no growth   Impression/recommendation Diabetic foot infection with severe necrotizing wound on the right foot.  Underwent initially incision and drainage followed by fifth ray amputation and finally TMA yesterday. The initial wound culture is positive for group B streptococcus and has been on Unasyn IV every 6 hours. On discharge  will be going on IV ceftriaxone 2 g until November 08, 2020. We may be  able to switch him to p.o. antibiotic midway.  Also depends on how the wound is healing as well as the pathology.   Will need PICC line He does not have insurance and hence his antibiotics will have to be worked out. Patient also said that he is going to go live with his friend.   Diabetes mellitus.  Was poorly controlled on admission..  Now on insulin.  Hypertension on Coreg, lisinopril.  Hyperlipidemia on Lipitor  Liver cirrhosis with portal hypertension seen on CT abdomen.  Transaminitis is resolved. Improving alkaline phosphatase  Discussed the management with the patient and the pharmacist.

## 2020-10-13 ENCOUNTER — Inpatient Hospital Stay: Payer: Self-pay

## 2020-10-13 ENCOUNTER — Other Ambulatory Visit: Payer: Self-pay | Admitting: Internal Medicine

## 2020-10-13 DIAGNOSIS — M869 Osteomyelitis, unspecified: Secondary | ICD-10-CM

## 2020-10-13 DIAGNOSIS — E1169 Type 2 diabetes mellitus with other specified complication: Secondary | ICD-10-CM | POA: Diagnosis present

## 2020-10-13 DIAGNOSIS — K766 Portal hypertension: Secondary | ICD-10-CM

## 2020-10-13 LAB — GLUCOSE, CAPILLARY
Glucose-Capillary: 189 mg/dL — ABNORMAL HIGH (ref 70–99)
Glucose-Capillary: 187 mg/dL — ABNORMAL HIGH (ref 70–99)
Glucose-Capillary: 206 mg/dL — ABNORMAL HIGH (ref 70–99)
Glucose-Capillary: 261 mg/dL — ABNORMAL HIGH (ref 70–99)

## 2020-10-13 MED ORDER — METHOCARBAMOL 750 MG PO TABS: 750.0000 mg | ORAL_TABLET | Freq: Four times a day (QID) | ORAL | 1 refills | Status: DC | PRN

## 2020-10-13 MED ORDER — SODIUM CHLORIDE 0.9% FLUSH
10.0000 mL | Freq: Two times a day (BID) | INTRAVENOUS | Status: DC
  Administered 2020-10-13: 15:00:00 30 mL
  Administered 2020-10-13 – 2020-10-14 (×2): 10 mL

## 2020-10-13 MED ORDER — CEFTRIAXONE IV (FOR PTA / DISCHARGE USE ONLY): 2.0000 g | INTRAVENOUS | 0 refills | Status: DC

## 2020-10-13 MED ORDER — CHLORHEXIDINE GLUCONATE CLOTH 2 % EX PADS
6.0000 | MEDICATED_PAD | Freq: Every day | CUTANEOUS | Status: DC
  Administered 2020-10-13 – 2020-10-14 (×2): 6 via TOPICAL

## 2020-10-13 MED ORDER — ACETAMINOPHEN 325 MG PO TABS: 650.0000 mg | ORAL_TABLET | ORAL | Status: DC | PRN

## 2020-10-13 MED ORDER — CARVEDILOL 3.125 MG PO TABS: 3.1250 mg | ORAL_TABLET | Freq: Two times a day (BID) | ORAL | 2 refills | Status: DC

## 2020-10-13 MED ORDER — GABAPENTIN 300 MG PO CAPS: 300.0000 mg | ORAL_CAPSULE | Freq: Every morning | ORAL | 2 refills | Status: DC

## 2020-10-13 MED ORDER — GABAPENTIN 300 MG PO CAPS: 600.0000 mg | ORAL_CAPSULE | Freq: Every day | ORAL | 2 refills | Status: DC

## 2020-10-13 MED ORDER — PEN NEEDLES 32G X 6 MM MISC: 1.0000 | Freq: Three times a day (TID) | 2 refills | Status: DC

## 2020-10-13 MED ORDER — SODIUM CHLORIDE 0.9 % IV SOLN
2.0000 g | INTRAVENOUS | Status: DC
  Administered 2020-10-13 – 2020-10-14 (×2): 2 g via INTRAVENOUS
  Filled 2020-10-13: qty 2
  Filled 2020-10-13 (×2): qty 20

## 2020-10-13 MED ORDER — BASAGLAR KWIKPEN 100 UNIT/ML ~~LOC~~ SOPN: 30.0000 [IU] | PEN_INJECTOR | Freq: Every day | SUBCUTANEOUS | 3 refills | Status: DC

## 2020-10-13 MED ORDER — HYDROCODONE-ACETAMINOPHEN 5-325 MG PO TABS: 1.0000 | ORAL_TABLET | Freq: Four times a day (QID) | ORAL | 0 refills | Status: AC | PRN

## 2020-10-13 MED ORDER — FLUTICASONE PROPIONATE 50 MCG/ACT NA SUSP: 2.0000 | Freq: Every day | NASAL | 2 refills | Status: DC

## 2020-10-13 MED ORDER — INSULIN LISPRO (1 UNIT DIAL) 100 UNIT/ML (KWIKPEN): 11.0000 [IU] | PEN_INJECTOR | Freq: Three times a day (TID) | SUBCUTANEOUS | 3 refills | Status: DC

## 2020-10-13 MED ORDER — OXYCODONE HCL 5 MG PO TABS: 5.0000 mg | ORAL_TABLET | ORAL | 0 refills | Status: AC | PRN

## 2020-10-13 MED ORDER — SODIUM CHLORIDE 0.9% FLUSH: 10.0000 mL | INTRAVENOUS | Status: DC | PRN

## 2020-10-13 NOTE — Progress Notes (Signed)
PROGRESS NOTE    NELSON NOONE   XLK:[id number]  DOB: 06/29/1971  PCP: Coral Spikes, DO    DOA: 09/27/2020 LOS: 15   Brief Narrative   [name] is a 50 y.o. male with medical history significant for CAD, hypertension, hyperlipidemia, diabetes mellitus, right diabetic foot ulcer.  He presented to the hospital because of pain, swelling and redness of the right foot with increasing drainage from the right foot ulcer.  He reported falling off his truck and sustaining a cut to his right foot after slipping on ice.  This happened a few days prior to admission   He was admitted to the hospital for sepsis secondary to right foot cellulitis with abscess and osteomyelitis.  He was treated with empiric IV antibiotics, IV fluids and analgesics.  He was seen in consultation by the podiatrist.  He underwent partial fifth ray amputation, incision and drainage of the right foot with placement of antibiotic beads on 10/01/2020.  He was also evaluated by the vascular surgeon and he underwent abdominal aortogram and right lower extremity distal runoff third order catheter placement on 10/03/2020.  No hemodynamically significant stenosis was identified.  On 2/23, podiatry took patient back to the OR for right transmetatarsal imitation, Achilles lengthening and rotational skin flap closure.   He has poorly controlled type 2 diabetes mellitus.  Hemoglobin A1c was 11.2%. He was started on Lantus on NovoLog.    Assessment & Plan   Principal Problem:   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HCC) Active Problems:   Coronary artery disease involving native coronary artery of native heart without angina pectoris   Uncontrolled type 2 diabetes mellitus with circulatory disorder, without long-term current use of insulin (HCC)   Essential hypertension   Hyperlipidemia   Cellulitis in diabetic foot (HCC)   Transaminitis   Sepsis ([place])   Sepsis secondary to right foot cellulitis with abscess and  osteomyelitis Right diabetic foot ulcer status post partial fifth ray amputation Sepsis present on admission with tachycardia, leukocytosis in setting of cellulitis. --Podiatry, infectious disease are consulted --Transitioned Unasyn>>Rocephin for d/c  --Pain uncontrolled today, titrate analgesics  Type 2 diabetes with hyperglycemia, peripheral neuropathy-uncontrolled, hemoglobin A1c 11.2%.   Home regimen appears to be Metformin, glipizide, Jardiance --Continue Lantus, NovoLog --Titrate insulin for inpatient goal 140-180 --Continue gabapentin  --New to insulin use, diabetes coordinator consulted  --Patient education on insulin use prior to d/c  Leg spasms -patient reported 2/24. Only somewhat improved with oral Mag-Ox.   --Robaxin as needed  Sinus headaches, sinusitis -improved with Flonase.  Analgesics as needed for headache.  Carpal tunnel syndrome -patient complains of bilateral carpal tunnel symptoms, left greater than right, involving all fingers.  No elbow pain.  No sign of atrophy or objective weakness. --Wrist splints overnight --Continue gabapentin, added smaller daytime dose with some mild improvement --Ortho outpatient follow-up for nerve conduction study and possible injection vs surgery.   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  Right upper quadrant ultrasound showed cholelithiasis without evidence of cholecystitis or masses.  There was some fluid around the gallbladder, no CBD dilation. --Outpatient follow-up with GI, general surgery recommended  Coronary artery disease -chronic, stable --Continue aspirin, Lipitor, Coreg  Hypertension -continue Coreg, lisinopril, as needed labetalol or hydralazine if uncontrolled  Hyperlipidemia -continue Lipitor   Lack of medical insurance -TOC is following and assisting with disposition   DVT  prophylaxis:    Diet:  Diet Orders (From admission, onward)    Start     Ordered   10/11/20 2218  Diet heart healthy/carb modified Room service appropriate? Yes; Fluid consistency: Thin  Diet effective now       Question Answer Comment  Diet-HS Snack? Nothing   Room service appropriate? Yes   Fluid consistency: Thin      10/11/20 2217            Code Status: Full Code    Subjective 10/13/20    Patient reports quite a lot more pain at the area of his Achilles and up his calf today, post-op day 2.  Has never used insulin, feels fairly confident in using it.  Denies fever/chills, n/v/d or other complaints.   Disposition Plan & Communication   Status is: Inpatient  Remains inpatient appropriate because: increased post op pain titrating pain meds, and pt new to insulin requiring education for safe use.    Dispo: The patient is from: Home              Anticipated d/c is to: Home              Anticipated d/c date is: 2/26              Patient currently is not medically stable to d/c.   Difficult to place patient Yes    Consults, Procedures   Consultants:   Infectious disease  Podiatry  Vascular surgery  General surgery  Procedures:   2/13: Partial fifth ray amputation, I&D  2/15: Abdominal aortogram and RLE runoff with third order catheter placement   2/23: Right foot revision surgery with skin flap closure  2/25: R upper extremity PICC line placed    Antimicrobials:  Anti-infectives (From admission, onward)   Start     Dose/Rate Route Frequency Ordered Stop   10/14/20 0000  cefTRIAXone (ROCEPHIN) IVPB        2 g Intravenous Every 24 hours 10/13/20 1439 11/08/20 2359   10/13/20 1200  cefTRIAXone (ROCEPHIN) 2 g in sodium chloride 0.9 % 100 mL IVPB        2 g 200 mL/hr over 30 Minutes Intravenous Every 24 hours 10/13/20 0939     10/06/20 0600  ceFAZolin (ANCEF) IVPB 2g/100 mL premix        2 g 200 mL/hr over 30 Minutes Intravenous On call to O.R.  10/05/20 2241 10/07/20 0559   10/03/20 1045  ceFAZolin (ANCEF) IVPB 1 g/50 mL premix  Status:  Discontinued       Note to Pharmacy: To be given in specials   1 g 100 mL/hr over 30 Minutes Intravenous  Once 10/03/20 0956 10/03/20 1001   10/03/20 1045  ceFAZolin (ANCEF) IVPB 2g/100 mL premix       Note to Pharmacy: To be given in specials   2 g 200 mL/hr over 30 Minutes Intravenous  Once 10/03/20 1001 10/03/20 1245   10/02/20 1800  Ampicillin-Sulbactam (UNASYN) 3 g in sodium chloride 0.9 % 100 mL IVPB  Status:  Discontinued        3 g 200 mL/hr over 30 Minutes Intravenous Every 6 hours 10/02/20 1628 10/13/20 0939   09/28/20 1800  vancomycin (VANCOREADY) IVPB 1500 mg/300 mL  Status:  Discontinued        1,500 mg 150 mL/hr over 120 Minutes Intravenous Every 12 hours 09/28/20 0915 09/28/20 1553   09/28/20 1800  linezolid (ZYVOX) IVPB 600 mg  Status:  Discontinued        600 mg 300 mL/hr over 60 Minutes Intravenous Every 12 hours 09/28/20 1553 10/02/20 1625   09/28/20 1700  piperacillin-tazobactam (ZOSYN) IVPB 3.375 g  Status:  Discontinued        3.375 g 12.5 mL/hr over 240 Minutes Intravenous Every 8 hours 09/28/20 1553 10/02/20 1624   09/28/20 0600  ceFEPIme (MAXIPIME) 2 g in sodium chloride 0.9 % 100 mL IVPB  Status:  Discontinued        2 g 200 mL/hr over 30 Minutes Intravenous Every 8 hours 09/28/20 0216 09/28/20 1553   09/28/20 0500  vancomycin (VANCOREADY) IVPB 1750 mg/350 mL  Status:  Discontinued        1,750 mg 175 mL/hr over 120 Minutes Intravenous Every 12 hours 09/28/20 0222 09/28/20 0915   09/28/20 0200  metroNIDAZOLE (FLAGYL) IVPB 500 mg  Status:  Discontinued        500 mg 100 mL/hr over 60 Minutes Intravenous Every 8 hours 09/27/20 2320 09/28/20 1552   09/27/20 2130  ceFEPIme (MAXIPIME) 2 g in sodium chloride 0.9 % 100 mL IVPB        2 g 200 mL/hr over 30 Minutes Intravenous  Once 09/27/20 2120 09/27/20 2310   09/27/20 2130  metroNIDAZOLE (FLAGYL) IVPB 500 mg  Status:   Discontinued        500 mg 100 mL/hr over 60 Minutes Intravenous  Once 09/27/20 2120 09/28/20 0122   09/27/20 2130  vancomycin (VANCOCIN) IVPB 1000 mg/200 mL premix        1,000 mg 200 mL/hr over 60 Minutes Intravenous  Once 09/27/20 2120 09/28/20 0106        Micro   2/11 Blood culture - + group B strep (S. Agalactiae)  Objective   Vitals:   10/12/20 2020 10/13/20 0503 10/13/20 0808 10/13/20 1553  BP: 107/66 113/69 (!) 103/59 111/75  Pulse: 84 78 72 82  Resp: _0 Temp: 99.1 F (37.3 C) 98.7 F (37.1 C) 98 F (36.7 C) 97.7 F (36.5 C)  TempSrc: Oral Oral  Oral  SpO2: 99% 97% 97% 99%  Weight:      Height:        Intake/Output Summary (Last 24 hours) at 10/13/2020 1721 Last data filed at 10/13/2020 1604 Gross per 24 hour  Intake 480 ml  Output 2950 ml  Net -2470 ml   Filed Weights   09/27/20 1859 09/29/20 1000 10/11/20 1641  Weight: 104.3 kg 104.3 kg 104.3 kg    Physical Exam:  General exam: awake, alert, no acute distress Respiratory system: symmetric chest rise, 0normal respiratory effort, on room air. Cardiovascular system: normal S1/S2, RRR, no pedal edema.   Central nervous system: A&O x3. no gross focal neurologic deficits, normal speech Extremities: right foot in boot, no peripheral edema, normal tone   Labs   Data Reviewed: I have personally reviewed following labs and imaging studies  CBC: Recent Labs  Lab 10/07/20 0350 10/08/20 0348 10/09/20 0436 10/12/20 0308  WBC 11.1* 9.3 11.4* 13.9*  NEUTROABS 7.0 5.4 6.6  --   HGB 12.6* 12.6* 12.8* 13.4  HCT 37.6* 37.0* 38.7* 38.5*  MCV 96.4 95.9 96.8 94.8  PLT 354 321 381 540   Basic Metabolic Panel: Recent Labs  Lab 10/07/20 0350 10/08/20 0348 10/09/20 0436 10/12/20 0308  NA 132* 132* 134* 134*  K 3.7 3.9 4.2 4.8  CL 98 99 98 100  CO2 _1 GLUCOSE 184* 179*  187* 295*  BUN _0 CREATININE 0.80 1.01 0.98 0.96  CALCIUM 8.3* 8.5* 9.0 8.8*  MG 1.6* 1.7 1.8 1.7  PHOS  3.8 3.8 3.6  --    GFR: Estimated Creatinine Clearance: 118.5 mL/min (by C-G formula based on SCr of 0.96 mg/dL). Liver Function Tests: Recent Labs  Lab 10/07/20 0350 10/08/20 0348 10/09/20 0436 10/12/20 0308  AST 37 32 35 36  ALT _1 ALKPHOS 255* 227* 235* 183*  BILITOT 0.8 0.8 0.6 0.8  PROT 7.5 7.3 8.0 8.0  ALBUMIN 2.2* 2.2* 2.4* 2.5*   No results for input(s): LIPASE, AMYLASE in the last 168 hours. No results for input(s): AMMONIA in the last 168 hours. Coagulation Profile: No results for input(s): INR, PROTIME in the last 168 hours. Cardiac Enzymes: No results for input(s): CKTOTAL, CKMB, CKMBINDEX, TROPONINI in the last 168 hours. BNP (last 3 results) No results for input(s): PROBNP in the last 8760 hours. HbA1C: No results for input(s): HGBA1C in the last 72 hours. CBG: Recent Labs  Lab 10/12/20 1626 10/12/20 2016 10/13/20 0738 10/13/20 1218 10/13/20 1555  GLUCAP 191* 158* 189* 187* 206*   Lipid Profile: No results for input(s): CHOL, HDL, LDLCALC, TRIG, CHOLHDL, LDLDIRECT in the last 72 hours. Thyroid Function Tests: No results for input(s): TSH, T4TOTAL, FREET4, T3FREE, THYROIDAB in the last 72 hours. Anemia Panel: No results for input(s): VITAMINB12, FOLATE, FERRITIN, TIBC, IRON, RETICCTPCT in the last 72 hours. Sepsis Labs: No results for input(s): PROCALCITON, LATICACIDVEN in the last 168 hours.  No results found for this or any previous visit (from the past 240 hour(s)).    Imaging Studies   DG Foot 2 Views Right  Result Date: 10/12/2020 CLINICAL DATA:  Status post transmetatarsal amputation today. EXAM: RIGHT FOOT - 2 VIEW COMPARISON:  Plain films of the right foot 10/09/2020. FINDINGS: The patient has undergone first through fourth transmetatarsal amputation. There has also been extension of prior fifth metatarsal amputation. Antibiotic impregnated beads are in place at the surgical site. Surgical staples noted. No acute abnormality.  IMPRESSION: Status post transmetatarsal amputation.  No acute finding. Electronically Signed   By: Inge Rise M.D.   On: 10/12/2020 16:40   DG MINI C-ARM IMAGE ONLY  Result Date: 10/11/2020 There is no interpretation for this exam.  This order is for images obtained during a surgical procedure.  Please See "Surgeries" Tab for more information regarding the procedure.   [place] EKG SITE RITE  Result Date: 10/13/2020 If [facility] image not attached, placement could not be confirmed due to current cardiac rhythm.    Medications   Scheduled Meds: . atorvastatin  40 mg Oral Daily  . carvedilol  3.125 mg Oral BID WC  . Chlorhexidine Gluconate Cloth  6 each Topical Daily  . fluticasone  2 spray Each Nare Daily  . gabapentin  300 mg Oral Daily  . gabapentin  600 mg Oral QHS  . insulin aspart  0-15 Units Subcutaneous TID WC  . insulin aspart  0-5 Units Subcutaneous QHS  . insulin aspart  8 Units Subcutaneous TID WC  . insulin glargine  30 Units Subcutaneous Daily  . lisinopril  5 mg Oral Daily  . melatonin  5 mg Oral QHS  . sodium chloride flush  10-40 mL Intracatheter Q12H  . sodium chloride flush  3 mL Intravenous Q12H   Continuous Infusions: . sodium chloride    . sodium chloride 10 mL/hr at 10/11/20 1659  .  cefTRIAXone (ROCEPHIN)  IV 2 g (10/13/20 1349)       LOS: 15 days    Time spent: 30 minutes with >50% spent at bedside and in coordination of care.    Ezekiel Slocumb, DO [facility]  10/13/2020, 5:21 PM      If 7PM-7AM, please contact night-coverage. How to contact the [facility] Attending or Consulting provider [place] or covering provider during after hours [place], for this patient?    1. Check the care team in [facility] and look for a) attending/consulting TRH provider listed and b) the [facility] team listed 2. Log into [email or web address] and use [place]'s universal password to access. If you do not have the password, please contact the hospital operator. 3. Locate the  [facility] provider you are looking for under [facility] and page to a number that you can be directly reached. 4. If you still have difficulty reaching the provider, please page the [facility] (Director on Call) for the Hospitalists listed on amion for assistance.

## 2020-10-13 NOTE — TOC Progression Note (Addendum)
Transition of Care [facility]) - Progression Note    Patient Details  Name: [name] MRN: [id number] Date of Birth: 19-Aug-1971  Transition of Care [facility]) CM/SW [place], LCSW Phone Number: 10/13/2020, 9:26 AM  Clinical Narrative:   Notified by MD that patient will likely DC today after getting PICC line.  Referral made to [facility] for charity services, they will follow for RN and PT pending financial application. They will call into patient's room for information needed, notified patient who verbalized understanding.   Notified Pam with [facility] of plan for discharge.  Notified [facility] Representative [place] and that patient will need to be seen as soon as possible to establish PCP services, referral made during this hospital stay. Asked Dr. Algis Liming (UR) if he can write orders for home health if needed prior to patient getting care established at [facility], he agreed.  Patient reported he will be staying with a friend in [place] and will get the address today to let [facility] agency know. Patient reported his friend can pick him up at time of DC, but it would have to be after 5 pm since his friend works.   Glucometer provided. Asked MD to send medications to [facility], TOC to pick up and deliver to bedside prior to DC. MD ordering Diabetes Education prior to DC.  Charity DME (RW, 3 in 1 with drop arm) ordered through [employer].    2:05- Discharge delayed to tomorrow. Updated Pam ([facility]) and [place] ([facility]). Dr. Arbutus Ped to send scripts to [facility] today so they can be delivered to bedside by [facility] Assistant since they are not open over the weekend.    3:10- TOC Assistant Holly to pick up meds at 4pm and deliver to 1C RN assigned to patient in preparation for DC tomorrow.   Expected Discharge Plan: Home/Self Care Barriers to Discharge: Continued Medical Work  up  Expected Discharge Plan and Services Expected Discharge Plan: Home/Self Care       Living arrangements for the past 2 months: Single Family Home                                       Social Determinants of Health (SDOH) Interventions    Readmission Risk Interventions No flowsheet data found.

## 2020-10-13 NOTE — Progress Notes (Signed)
PHARMACY CONSULT NOTE FOR:  OUTPATIENT  PARENTERAL ANTIBIOTIC THERAPY (OPAT)  Indication: Diabetic foot infection with osteomyelitis Regimen: ceftriaxone 2gm IV q24h End date: 11/08/2020  IV antibiotic discharge orders are pended. To discharging provider:  please sign these orders via discharge navigator,  Select New Orders & click on the button choice - Manage This Unsigned Work.     Thank you for allowing pharmacy to be a part of this patient's care.  Doreene Eland, PharmD, BCPS.   Work Cell: [phone] 10/13/2020 9:50 AM

## 2020-10-13 NOTE — Progress Notes (Signed)
Date of Admission:  09/27/2020  Day 17 of antibiotic    ID: [name] is a 50 y.o. male Principal Problem:   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[place]) Active Problems:   Coronary artery disease involving native coronary artery of native heart without angina pectoris   Uncontrolled type 2 diabetes mellitus with circulatory disorder, without long-term current use of insulin (HCC)   Essential hypertension   Hyperlipidemia   Cellulitis in diabetic foot ([place])   Transaminitis   Sepsis ([place])    Subjective: Patient doing better No distress Had PICC line placement   Medications:  . atorvastatin  40 mg Oral Daily  . carvedilol  3.125 mg Oral BID WC  . Chlorhexidine Gluconate Cloth  6 each Topical Daily  . fluticasone  2 spray Each Nare Daily  . gabapentin  300 mg Oral Daily  . gabapentin  600 mg Oral QHS  . insulin aspart  0-15 Units Subcutaneous TID WC  . insulin aspart  0-5 Units Subcutaneous QHS  . insulin aspart  8 Units Subcutaneous TID WC  . insulin glargine  30 Units Subcutaneous Daily  . lisinopril  5 mg Oral Daily  . melatonin  5 mg Oral QHS  . sodium chloride flush  10-40 mL Intracatheter Q12H  . sodium chloride flush  3 mL Intravenous Q12H    Objective: Vital signs in last 24 hours: Temp:  [97.7 F (36.5 C)-99.1 F (37.3 C)] 98 F (36.7 C) (02/25 0808) Pulse Rate:  [72-84] 72 (02/25 0808) Resp:  [18] 18 (02/25 0808) BP: (103-113)/(59-70) 103/59 (02/25 0808) SpO2:  [97 %-100 %] 97 % (02/25 0808)  PHYSICAL EXAM:  General: Alert, cooperative, no distress, appears stated age.  Pale Lungs: Clear to auscultation bilaterally. No Wheezing or Rhonchi. No rales. Heart: Regular rate and rhythm, no murmur, rub or gallop. Abdomen: Soft, non-tender,not distended. Bowel sounds normal. No masses Extremities: Right leg surgical dressing not removed Skin: No rashes or lesions. Or bruising Lymph: Cervical, supraclavicular normal. Neurologic: Grossly  non-focal  Lab Results Recent Labs    10/12/20 0308  WBC 13.9*  HGB 13.4  HCT 38.5*  NA 134*  K 4.8  CL 100  CO2 26  BUN 19  CREATININE 0.96   Liver Panel Recent Labs    10/12/20 0308  PROT 8.0  ALBUMIN 2.5*  AST 36  ALT 25  ALKPHOS 183*  BILITOT 0.8   Sedimentation Rate No results for input(s): ESRSEDRATE in the last 72 hours. C-Reactive Protein No results for input(s): CRP in the last 72 hours.  Microbiology: GBS and wound culture Studies/Results: DG Foot 2 Views Right  Result Date: 10/12/2020 CLINICAL DATA:  Status post transmetatarsal amputation today. EXAM: RIGHT FOOT - 2 VIEW COMPARISON:  Plain films of the right foot 10/09/2020. FINDINGS: The patient has undergone first through fourth transmetatarsal amputation. There has also been extension of prior fifth metatarsal amputation. Antibiotic impregnated beads are in place at the surgical site. Surgical staples noted. No acute abnormality. IMPRESSION: Status post transmetatarsal amputation.  No acute finding. Electronically Signed   By: Inge Rise M.D.   On: 10/12/2020 16:40   DG MINI C-ARM IMAGE ONLY  Result Date: 10/11/2020 There is no interpretation for this exam.  This order is for images obtained during a surgical procedure.  Please See "Surgeries" Tab for more information regarding the procedure.   [place] EKG SITE RITE  Result Date: 10/13/2020 If [facility] image not attached, placement could not be  confirmed due to current cardiac rhythm.    Assessment/Plan:  Diabetic foot infection with severe necrotizing wound on the right foot.  Underwent incision and drainage initially and then had fifth ray amputation.  As the necrosis was progressing he underwent TMA on 10/11/2020. Patient was on Unasyn and was switched to ceftriaxone today.Marland Kitchen  He will need antibiotics until 23 March. We may be able to switch him to p.o. antibiotic midway if pathology does not show any osteo as well as depends on how the wound  progresses. Patient has PICC line He is going to stay with his friend.  Anticipate discharge tomorrow. I will follow him in my clinic on 10/26/2020.  Diabetes mellitus.  Was poorly controlled on admission.  Now on insulin  Hypertension on Coreg and lisinopril  Hyperlipidemia on Lipitor  Liver cirrhosis with portal hypertension seen on CT abdomen.  Discussed the management with the patient. ID will sign off.  Call if needed.

## 2020-10-13 NOTE — Treatment Plan (Signed)
Diagnosis: Diabetic foot infection rt  with osteomyelitis-s/p TMA Baseline Creatinine <1 Culture Result: GBS  No Known Allergies  OPAT Orders Discharge antibiotics: Ceftriaxone 2 grams IV every 24 hours until 11/08/20  ( may stop earlier and change to PO depending on follow up visit assessment on 3.10.22)  [facility] Care Per Protocol:  Labs weekly while on IV antibiotics: X__ CBC with differential  _X_ CMP   _X_ Please pull PIC at completion of IV antibiotics _  Fax weekly labs to [phone]  Clinic Follow Up Appt: 10/26/20 at 9.45am   Call [phone] with any questions

## 2020-10-13 NOTE — Progress Notes (Signed)
Physical Therapy Treatment Patient Details Name: [name] MRN: [id number] DOB: 04-07-1971 Today's Date: 10/13/2020    History of Present Illness [name] is a 50 y.o. male is status post right partial fifth ray amputation with incision and drainage with application of antibiotic beads and wound VAC.  Patient has remained nonweightbearing since procedure.  He presents with R foot infection; cellulitis in diabetic foot. Pt back to OR 2/23 for Right transmet amputation, right achilles lengthening, now with CAM Rocker boot and NWB.    PT Comments    Pt agreeable to session despite pain from intermittent cramps in operative leg calf. Pt advances his abilities with transfers, but struggles to rise from standard height surface. Pt also unable to walk any distance without PWB on operative foot, asked to be NWB multiple times, but he is concerned about LOB. Commenced stairs training, but session largely limited by pain. Pt left up in chair.   Follow Up Recommendations  Home health PT;Supervision - Intermittent;Supervision for mobility/OOB     Equipment Recommendations  Rolling walker with 5" wheels;3in1 (PT)    Recommendations for Other Services       Precautions / Restrictions Precautions Precautions: Fall Restrictions RLE Weight Bearing: Non weight bearing RLE Partial Weight Bearing Percentage or Pounds: camrocker on at all times    Mobility  Bed Mobility Overal bed mobility: Modified Independent                  Transfers Overall transfer level: Needs assistance Equipment used: Rolling walker (2 wheeled) Transfers: Sit to/from [facility] Sit to Stand: Supervision Stand pivot transfers: Supervision       General transfer comment: labored efforts, but no assist needed  Ambulation/Gait Ambulation/Gait assistance: Supervision Gait Distance (Feet): 6 Feet Assistive device: Rolling walker (2 wheeled)       General Gait Details: hop-to  gait, still elects to use opertive foot for PWB, reports his balance is not good enough to even attempt NWB; author believes safety does not preclude attempting NWB.   Stairs Stairs: Yes Stairs assistance: Min guard Stair Management: With walker;Backwards Number of Stairs: 3 General stair comments: 3 3-inch stairs for practice   Wheelchair Mobility    Modified Rankin (Stroke Patients Only)       Balance Overall balance assessment: Mild deficits observed, not formally tested;Modified Independent                                          Cognition Arousal/Alertness: Awake/alert Behavior During Therapy: Flat affect;WFL for tasks assessed/performed Overall Cognitive Status: Within Functional Limits for tasks assessed                                        Exercises Other Exercises Other Exercises: LLE SLR x15, heel slides x15, SAQx15, LLE leg press (author resisted) 1x10    General Comments        Pertinent Vitals/Pain Pain Assessment: 0-10 Pain Score: 7  Pain Location: R foot Pain Descriptors / Indicators: Aching;Sharp Pain Intervention(s): Limited activity within patient's tolerance    Home Living                      Prior Function            PT Goals (  current goals can now be found in the care plan section) Acute Rehab PT Goals Patient Stated Goal: to get home PT Goal Formulation: With patient Time For Goal Achievement: 10/20/20 Potential to Achieve Goals: Fair Progress towards PT goals: Progressing toward goals    Frequency    7X/week      PT Plan Current plan remains appropriate    Co-evaluation              AM-PAC PT "6 Clicks" Mobility   Outcome Measure  Help needed turning from your back to your side while in a flat bed without using bedrails?: None Help needed moving from lying on your back to sitting on the side of a flat bed without using bedrails?: None Help needed moving to and from a  bed to a chair (including a wheelchair)?: A Little Help needed standing up from a chair using your arms (e.g., wheelchair or bedside chair)?: A Little Help needed to walk in hospital room?: A Little Help needed climbing 3-5 steps with a railing? : A Little 6 Click Score: 20    End of Session   Activity Tolerance: Patient limited by fatigue;Patient limited by pain Patient left: in chair;with call bell/phone within reach Nurse Communication: Mobility status PT Visit Diagnosis: Unsteadiness on feet (R26.81);Difficulty in walking, not elsewhere classified (R26.2);Muscle weakness (generalized) (M62.81)     Time: 1000-1035 PT Time Calculation (min) (ACUTE ONLY): 35 min  Charges:  $Gait Training: 8-22 mins $Therapeutic Exercise: 8-22 mins                     12:20 PM, 10/13/20 Etta Grandchild, PT, DPT Physical Therapist - [facility]  [phone] ([place])     [place] [facility] 10/13/2020, 12:18 PM

## 2020-10-13 NOTE — Progress Notes (Signed)
Peripherally Inserted Central Catheter Placement  The IV Nurse has discussed with the patient and/or persons authorized to consent for the patient, the purpose of this procedure and the potential benefits and risks involved with this procedure.  The benefits include less needle sticks, lab draws from the catheter, and the patient may be discharged home with the catheter. Risks include, but not limited to, infection, bleeding, blood clot (thrombus formation), and puncture of an artery; nerve damage and irregular heartbeat and possibility to perform a PICC exchange if needed/ordered by physician.  Alternatives to this procedure were also discussed.  Bard Power PICC patient education guide, fact sheet on infection prevention and patient information card has been provided to patient /or left at bedside.    PICC Placement Documentation  PICC Single Lumen 10/13/20 PICC Right Cephalic 39 cm 0 cm (Active)  Indication for Insertion or Continuance of Line Home intravenous therapies (PICC only) 10/13/20 1347  Exposed Catheter (cm) 0 cm 10/13/20 1347  Site Assessment Clean;Dry;Intact 10/13/20 1347  Line Status Flushed;Saline locked;Blood return noted 10/13/20 1347  Dressing Type Transparent;Securing device 10/13/20 1347  Dressing Status Clean;Dry;Intact 10/13/20 1347  Antimicrobial disc in place? Yes 10/13/20 1347  Dressing Intervention New dressing 10/13/20 6294  Dressing Change Due 10/20/20 10/13/20 1347       Enos Fling 10/13/2020, 1:51 PM

## 2020-10-14 LAB — CBC
HCT: 35.2 % — ABNORMAL LOW (ref 39.0–52.0)
Hemoglobin: 11.9 g/dL — ABNORMAL LOW (ref 13.0–17.0)
MCH: 32.6 pg (ref 26.0–34.0)
MCHC: 33.8 g/dL (ref 30.0–36.0)
MCV: 96.4 fL (ref 80.0–100.0)
Platelets: 284 10*3/uL (ref 150–400)
RBC: 3.65 MIL/uL — ABNORMAL LOW (ref 4.22–5.81)
RDW: 12.7 % (ref 11.5–15.5)
WBC: 12.4 10*3/uL — ABNORMAL HIGH (ref 4.0–10.5)
nRBC: 0 % (ref 0.0–0.2)

## 2020-10-14 LAB — GLUCOSE, CAPILLARY
Glucose-Capillary: 145 mg/dL — ABNORMAL HIGH (ref 70–99)
Glucose-Capillary: 165 mg/dL — ABNORMAL HIGH (ref 70–99)
Glucose-Capillary: 120 mg/dL — ABNORMAL HIGH (ref 70–99)

## 2020-10-14 NOTE — Progress Notes (Signed)
AAOx4. VSS. O2 on room air, maintaining sats > 92%. C/o pain, controlled with PRN pain medication. Continent of bowel and bladder. Pt safety risks identified, addressed, and maintained to prevent injury. Comfort and hygiene measures provided, q4 vitals and q1 intentional rounding/safety checks completed and hygiene assistance provided as needed. Plan of care reviewed with pt with agreement. Wound care completed. No adverse effects from IV/ABT. Education provided with verbalized understanding. Personal items within reach. Bed in lowest position. Fresh water kept at bedside as well. Call light within reach. Will continue to monitor and endorse.

## 2020-10-14 NOTE — Progress Notes (Signed)
Daily Progress Note   Subjective  - 3 Day Post-Op  F/u TMA Right.  Patient complaining of some cramping to the right leg around the calf muscle and Achilles tendon area in the area of the tendo Achilles lengthening site.  Patient otherwise is doing well today.  Objective Vitals:   10/13/20 1925 10/13/20 2343 10/14/20 0404 10/14/20 0752  BP: 116/67 116/71 116/70 107/69  Pulse: 80 75 78 72  Resp: 20 20 20 16   Temp: 98.7 F (37.1 C) 98.2 F (36.8 C) 97.9 F (36.6 C) 97.9 F (36.6 C)  TempSrc: Oral Oral Oral Oral  SpO2: 99% 99% 100% 97%  Weight:      Height:        Physical Exam: Capillary fill time appears to be intact to dorsal and plantar aspects of flap right transmetatarsal amputation site.  Sutures and staples appear to be intact with skin edges well coapted overall.  Appears to have a mild amount of serous drainage from the plantar aspect of the flap as well as the dorsal lateral aspect of the flap with some mild tissue necrosis unchanged since previous visit.  No increased erythema or edema present, no purulence, no active gross signs of infection.  Incsion intact.        Laboratory CBC    Component Value Date/Time   WBC 12.4 (H) 10/14/2020 0426   HGB 11.9 (L) 10/14/2020 0426   HCT 35.2 (L) 10/14/2020 0426   PLT 284 10/14/2020 0426    BMET    Component Value Date/Time   NA 134 (L) 10/12/2020 0308   K 4.8 10/12/2020 0308   CL 100 10/12/2020 0308   CO2 26 10/12/2020 0308   GLUCOSE 295 (H) 10/12/2020 0308   BUN 19 10/12/2020 0308   CREATININE 0.96 10/12/2020 0308   CREATININE 0.98 04/13/2015 1440   CALCIUM 8.8 (L) 10/12/2020 0308   GFRNONAA >60 10/12/2020 0308   GFRAA >60 01/25/2017 1755    Assessment/Planning: S/p TMA for severe infection   Incsion looks good.  Good perfusion to skin flap.  Instructed on every 3 days changing dressing with Betadine wet-to-dry dressings as placed in orders.  C/W NWB to foot.  Keep boot on at all times as it is acting  as a splint or cast.  Suspect once medically stable can be d/c'd.  Dressing changed today.  Awaiting IV versus orders from ID.  Believe that all infection was removed with amputation.  Appreciate recommendations   Podiatry team to sign off at this time.  Patient to follow-up in clinic within 1 week of discharge date.   Caroline More, DPM  10/14/2020, 10:01 AM

## 2020-10-14 NOTE — Plan of Care (Signed)
  Problem: Education: Goal: Knowledge of General Education information will improve Description: Including pain rating scale, medication(s)/side effects and non-pharmacologic comfort measures 10/14/2020 1131 by Ansel Bong, RN Outcome: Progressing 10/14/2020 1131 by Ansel Bong, RN Outcome: Progressing   Problem: Health Behavior/Discharge Planning: Goal: Ability to manage health-related needs will improve 10/14/2020 1131 by Ansel Bong, RN Outcome: Progressing 10/14/2020 1131 by Ansel Bong, RN Outcome: Progressing   Problem: Clinical Measurements: Goal: Ability to maintain clinical measurements within normal limits will improve 10/14/2020 1131 by Ansel Bong, RN Outcome: Progressing 10/14/2020 1131 by Ansel Bong, RN Outcome: Progressing Goal: Will remain free from infection 10/14/2020 1131 by Ansel Bong, RN Outcome: Progressing 10/14/2020 1131 by Ansel Bong, RN Outcome: Progressing Goal: Diagnostic test results will improve 10/14/2020 1131 by Ansel Bong, RN Outcome: Progressing 10/14/2020 1131 by Ansel Bong, RN Outcome: Progressing Goal: Respiratory complications will improve 10/14/2020 1131 by Ansel Bong, RN Outcome: Progressing 10/14/2020 1131 by Ansel Bong, RN Outcome: Progressing Goal: Cardiovascular complication will be avoided 10/14/2020 1131 by Ansel Bong, RN Outcome: Progressing 10/14/2020 1131 by Ansel Bong, RN Outcome: Progressing   Problem: Activity: Goal: Risk for activity intolerance will decrease 10/14/2020 1131 by Ansel Bong, RN Outcome: Progressing 10/14/2020 1131 by Ansel Bong, RN Outcome: Progressing   Problem: Nutrition: Goal: Adequate nutrition will be maintained 10/14/2020 1131 by Ansel Bong, RN Outcome: Progressing 10/14/2020 1131 by Ansel Bong, RN Outcome: Progressing   Problem: Coping: Goal: Level of anxiety will decrease 10/14/2020 1131 by Ansel Bong, RN Outcome:  Progressing 10/14/2020 1131 by Ansel Bong, RN Outcome: Progressing   Problem: Elimination: Goal: Will not experience complications related to bowel motility 10/14/2020 1131 by Ansel Bong, RN Outcome: Progressing 10/14/2020 1131 by Ansel Bong, RN Outcome: Progressing Goal: Will not experience complications related to urinary retention 10/14/2020 1131 by Ansel Bong, RN Outcome: Progressing 10/14/2020 1131 by Ansel Bong, RN Outcome: Progressing   Problem: Pain Managment: Goal: General experience of comfort will improve 10/14/2020 1131 by Ansel Bong, RN Outcome: Progressing 10/14/2020 1131 by Ansel Bong, RN Outcome: Progressing   Problem: Safety: Goal: Ability to remain free from injury will improve 10/14/2020 1131 by Ansel Bong, RN Outcome: Progressing 10/14/2020 1131 by Ansel Bong, RN Outcome: Progressing   Problem: Skin Integrity: Goal: Risk for impaired skin integrity will decrease 10/14/2020 1131 by Ansel Bong, RN Outcome: Progressing 10/14/2020 1131 by Ansel Bong, RN Outcome: Progressing

## 2020-10-14 NOTE — Discharge Summary (Signed)
Physician Discharge Summary  [name]:[id number] DOB: [birth year] DOA: 09/27/2020  PCP: Coral Spikes, DO  Admit date: 09/27/2020 Discharge date: 10/14/2020  Admitted From: home Disposition:  Home w/friend  Recommendations for Outpatient Follow-up:  1. Follow up with PCP in 1-2 weeks 2. Please obtain BMP/CBC in one week 3. Please follow up with Infectious Disease as scheduled on 10/26/20 4. Please follow up with Dr. Vickki Muff, [place]: PT, RN, home infusion service  Equipment/Devices: rolling walker, 3-n-1   Discharge Condition: Stable  CODE STATUS: Full  Diet recommendation: Heart Healthy / Carb Modified    Discharge Diagnoses: Principal Problem:   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[place]) Active Problems:   Coronary artery disease involving native coronary artery of native heart without angina pectoris   Uncontrolled type 2 diabetes mellitus with circulatory disorder, without long-term current use of insulin ([place])   Essential hypertension   Hyperlipidemia   Cellulitis in diabetic foot ([place])   Transaminitis   Sepsis ([place])    Summary of HPI and Hospital Course:  [name] is a 50 y.o. male with medical history significant for CAD, hypertension, hyperlipidemia, diabetes mellitus, right diabetic foot ulcer.  He presented to the hospital because of pain, swelling and redness of the right foot with increasing drainage from the right foot ulcer.  He reported falling off his truck and sustaining a cut to his right foot after slipping on ice.  This happened a few days prior to admission   He was admitted to the hospital for sepsis secondary to right foot cellulitis with abscess and osteomyelitis.  He was treated with empiric IV antibiotics, IV fluids and analgesics.  He was seen in consultation by the podiatrist.  He underwent partial fifth ray amputation, incision and drainage of the right foot with placement of antibiotic beads on 10/01/2020.  He was  also evaluated by the vascular surgeon and he underwent abdominal aortogram and right lower extremity distal runoff third order catheter placement on 10/03/2020.  No hemodynamically significant stenosis was identified.  On 2/23, podiatry took patient back to the OR for right transmetatarsal imitation, Achilles lengthening and rotational skin flap closure.   He has poorly controlled type 2 diabetes mellitus.  Hemoglobin A1c was 11.2%. He was started on Lantus on NovoLog.    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 --Podiatry, infectious disease consulted --Transitioned Unasyn>>Rocephin until 11/08/20 --As needed pain medication  Type 2 diabetes with hyperglycemia, peripheral neuropathy-uncontrolled, hemoglobin A1c 11.2%.   Home regimen appears to be Metformin, glipizide, Jardiance --Continue Lantus, NovoLog --Continue gabapentin for neuropathy --New to insulin use, diabetes coordinator consulted and patient thoroughly educated prior to discharged, expressed comfort with giving himself insulin at home.  Leg spasms -patient reported 2/24.  Only somewhat improved with oral Mag-Ox.   Improved with Robaxin PRN.  Sinus headaches, sinusitis -improved with Flonase.  Analgesics as needed for headache.  Carpal tunnel syndrome -patient complains of bilateral carpal tunnel symptoms, left greater than right, involving all fingers.  No elbow pain.  No sign of atrophy or objective weakness. Wrist splints overnight. Trial of gabapentin with  Ortho outpatient follow-up for nerve conduction study and possible injection vs surgery.   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  RUQ ultrasound  showed cholelithiasis without evidence of cholecystitis or masses.  There was some fluid around the gallbladder, no CBD dilation. Outpatient follow-up with GI, general surgery recommended  Coronary artery disease -chronic, stable.   Aspirin, Lipitor, Coreg  Hypertension -Coreg, lisinopril  Hyperlipidemia -Lipitor   Discharge Instructions   Discharge Instructions    [facility] pharmacist to adjust dose for Vancomycin, Aminoglycosides and other anti-infective therapies as requested by physician.   Complete by: As directed    [facility] to provide Cath Flo 34m   Complete by: As directed    Administer for PICC line occlusion and as ordered by physician for other access device issues.   Anaphylaxis Kit: Provided to treat any anaphylactic reaction to the medication being provided to the patient if First Dose or when requested by physician   Complete by: As directed    Epinephrine 1465mml vial / amp: Administer 0.65m45m0.65ml16mubcutaneously once for moderate to severe anaphylaxis, nurse to call physician and pharmacy when reaction occurs and call 911 if needed for immediate care   Diphenhydramine 50mg32mIV vial: Administer 25-50mg 60mM PRN for first dose reaction, rash, itching, mild reaction, nurse to call physician and pharmacy when reaction occurs   Sodium Chloride 0.9% NS 500ml I54mdminister if needed for hypovolemic blood pressure drop or as ordered by physician after call to physician with anaphylactic reaction   Call MD for:  extreme fatigue   Complete by: As directed    Call MD for:  persistant dizziness or light-headedness   Complete by: As directed    Call MD for:  persistant nausea and vomiting   Complete by: As directed    Call MD for:  redness, tenderness, or signs of infection (pain, swelling, redness, odor or green/yellow discharge around incision site)   Complete by: As directed    Call MD for:  severe uncontrolled pain   Complete by: As directed     Call MD for:  temperature >100.4   Complete by: As directed    Change dressing on IV access line weekly and PRN   Complete by: As directed    Diet - low sodium heart healthy   Complete by: As directed    Discharge instructions   Complete by: As directed    Insulin & Blood Sugars -  - use insulin as prescribed - write down your blood sugars, and bring this to [facility] for primary care follow up - they will need to see how your sugars have been in order to adjust insulin regimen - please be sure to make an appointment to be seen there ASAP - within the next week if at all possible.  Pain Control -  - you can alternate the oxycodone and hydrocodone/acetaminophen as we have been doing here - regarding acetaminophen - If  you take regular Tylenol (acetaminophen), you should be aware not to take more than 4000 mg of acetaminophen in 24 hours (so if using hydrocodone/acetaminophen also - be careful not to take too much) - be sure to take stool softeners while using pain meds to prevent constipation  IV antibiotics should be continued as prescribed and follow up with Dr. RavishaDelaine Lametious disease specialist as scheduled (see follow up section of this packet).  Follow up with podiatry as instructed.   Flush IV access with Sodium Chloride 0.9% and Heparin 10 units/ml or 100 units/ml   Complete by: As directed    Home infusion instructions - [facility]   Complete by: As directed    Instructions: Flush IV access with Sodium Chloride 0.9% and Heparin 10units/ml or 100units/ml   Change dressing on IV access line: Weekly and PRN   Instructions Cath Flo 68m: Administer for PICC Line occlusion and as ordered by physician for other access device   [facility] pharmacist to adjust dose for: Vancomycin, Aminoglycosides and other anti-infective therapies as requested by physician   Increase activity slowly   Complete by: As directed    Leave dressing on - Keep it  clean, dry, and intact until clinic visit   Complete by: As directed    Method of administration may be changed at the discretion of home infusion pharmacist based upon assessment of the patient and/or caregiver's ability to self-administer the medication ordered   Complete by: As directed      Allergies as of 10/14/2020   No Known Allergies     Medication List    STOP taking these medications   clopidogrel 75 MG tablet Commonly known as: PLAVIX   empagliflozin 10 MG Tabs tablet Commonly known as: JARDIANCE   glipiZIDE 10 MG 24 hr tablet Commonly known as: GLUCOTROL XL   sulfamethoxazole-trimethoprim 800-160 MG tablet Commonly known as: BACTRIM DS     TAKE these medications   acetaminophen 325 MG tablet Commonly known as: TYLENOL Take 2 tablets (650 mg total) by mouth every 4 (four) hours as needed for mild pain, fever or headache.   atorvastatin 40 MG tablet Commonly known as: LIPITOR Take 1 tablet (40 mg total) by mouth daily.   Basaglar KwikPen 100 UNIT/ML Inject 30 Units into the skin daily.   carvedilol 3.125 MG tablet Commonly known as: COREG Take 1 tablet (3.125 mg total) by mouth 2 (two) times daily with a meal.   cefTRIAXone  IVPB Commonly known as: ROCEPHIN Inject 2 g into the vein daily for 25 days. Indication: Diabetic foot infection and osteomyelitis First Dose: Yes Last Day of Therapy:  11/08/20 Labs - Once weekly (Monday):  CBC/D and CMP, Remove PICC at completion of antibiotics Method of administration: IV Push Method of administration may be changed at the discretion of home infusion pharmacist based upon assessment of the patient and/or caregiver's ability to self-administer the medication ordered.   fluticasone 50 MCG/ACT nasal spray Commonly known as: FLONASE Place 2 sprays into both nostrils daily.   gabapentin 300 MG capsule Commonly known as: NEURONTIN Take 2 capsules (600 mg total) by mouth at bedtime. What changed:   when to take  this  reasons to take this   gabapentin 300 MG capsule Commonly known as: NEURONTIN Take 1 capsule (300 mg total) by mouth every morning. What changed: You were already taking a medication with the same name, and this prescription was added. Make sure you understand how and when to take each.   glucose blood test strip Commonly known as: ReliOn Prime Test Use as instructed   HYDROcodone-acetaminophen 5-325 MG tablet Commonly known as: NORCO/VICODIN Take 1-2 tablets by mouth every 6 (six) hours as needed for up to 5 days (1 tablet for moderate pain, 2 tablets for severe pain).   insulin lispro 100 UNIT/ML KwikPen Commonly known as: HUMALOG Inject 11 Units into the skin 3 (three) times daily with meals.   lisinopril 5 MG tablet Commonly known as: ZESTRIL TAKE ONE TABLET BY MOUTH ONCE DAILY   meloxicam 7.5 MG tablet Commonly known as: MOBIC Take 7.5 mg by mouth 2 (two) times daily.  metFORMIN 500 MG tablet Commonly known as: GLUCOPHAGE TAKE ONE TABLET BY MOUTH TWICE DAILY WITH A MEAL   methocarbamol 750 MG tablet Commonly known as: ROBAXIN Take 1 tablet (750 mg total) by mouth every 6 (six) hours as needed for muscle spasms.   oxyCODONE 5 MG immediate release tablet Commonly known as: Oxy IR/ROXICODONE Take 1-2 tablets (5-10 mg total) by mouth every 4 (four) hours as needed for up to 7 days (1 tablet for moderate pain, 2 tablets for severe pain).   Pen Needles 32G X 6 MM Misc 1 each by Does not apply route 4 (four) times daily -  before meals and at bedtime.   ReliOn Prime [employer] Use up to 4 times daily to check blood sugars.   ReliOn Ultra Thin Lancets Misc Use as directed to take blood sugar.   sildenafil 25 MG tablet Commonly known as: VIAGRA Take 1 tablet by mouth daily.            Discharge Care Instructions  (From admission, onward)         Start     Ordered   10/14/20 0000  Leave dressing on - Keep it clean, dry, and intact until clinic  visit        10/14/20 0947   10/13/20 0000  Change dressing on IV access line weekly and PRN  (Home infusion instructions - [facility] )        10/13/20 1439          Follow-up Information    P[place]on, [place] F, MD Follow up in 4 week(s).   Specialty: General Surgery Contact information: [address] [place] [address] [phone]        Delana Meyer, Dolores Lory, MD Follow up in 3 week(s).   Specialties: Vascular Surgery, Cardiology, Radiology, Vascular Surgery Why: with an ABI. Left message with office to follow up with patient for appointment.  Contact information: [place] [address] [phone]        Tsosie Billing, MD. Call.   Specialty: Infectious Diseases Why: Call to confirm follow up appointment regarding IV antibiotics Contact information: [place] [address] [phone]        [place]. Call.   Specialty: Primary Care Why: Make primary care appointment for as soon as possible - follow up on blood sugars and insulin regimen. Contact information: [address] [place] [phone]       Caroline More, [place]. Call.   Specialty: Podiatry Why: Follow up according to Dr. Deborra Medina instructions Contact information: [place] [address] [phone]              No Known Allergies   If you experience worsening of your admission symptoms, develop shortness of breath, life threatening emergency, suicidal or homicidal thoughts you must seek medical attention immediately by calling 911 or calling your MD immediately  if symptoms less severe.    Please note   You were cared for by a hospitalist during your hospital stay. If you have any questions about your discharge medications or the care you received while you were in the hospital after you are discharged, you can call the unit and asked to speak  with the hospitalist on call if the hospitalist that took care of you is not available. Once you are discharged, your primary care physician will handle any further medical issues. Please note that NO  REFILLS for any discharge medications will be authorized once you are discharged, as it is imperative that you return to your primary care physician (or establish a relationship with a primary care physician if you do not have one) for your aftercare needs so that they can reassess your need for medications and monitor your lab values.   Consultations:  Infectious disease  Podiatry   Procedures/Studies: CT ABDOMEN PELVIS W CONTRAST  Result Date: 09/30/2020 CLINICAL DATA:  Cholelithiasis.  Cirrhosis. EXAM: CT ABDOMEN AND PELVIS WITH CONTRAST TECHNIQUE: Multidetector CT imaging of the abdomen and pelvis was performed using the standard protocol following bolus administration of intravenous contrast. CONTRAST:  132m OMNIPAQUE IOHEXOL 300 MG/ML  SOLN COMPARISON:  January 25, 2017 FINDINGS: Lower chest: There are trace bilateral pleural effusions. The heart size is borderline enlarged. Hepatobiliary: The liver appears cirrhotic. There is gallbladder wall thickening and pericholecystic free fluid.There is no biliary ductal dilation. Pancreas: Normal contours without ductal dilatation. No peripancreatic fluid collection. Spleen: The spleen is enlarged. Adrenals/Urinary Tract: --Adrenal glands: Unremarkable. --Right kidney/ureter: No hydronephrosis or radiopaque kidney stones. --Left kidney/ureter: No hydronephrosis or radiopaque kidney stones. --Urinary bladder: Unremarkable. Stomach/Bowel: --Stomach/Duodenum: No hiatal hernia or other gastric abnormality. Normal duodenal course and caliber. --Small bowel: Unremarkable. --Colon: Unremarkable. --Appendix: Normal. Vascular/Lymphatic: Atherosclerotic calcification is present within the non-aneurysmal abdominal aorta, without hemodynamically significant stenosis.  --there are mildly enlarged retroperitoneal lymph nodes which have not substantially changed from prior study. --No mesenteric lymphadenopathy. --there are multiple enlarged right pelvic and right inguinal lymph nodes. Reproductive: Unremarkable Other: There is a trace amount of free fluid in the patient's abdomen and pelvis. No free air. The abdominal wall is normal. Musculoskeletal. No acute displaced fractures. IMPRESSION: 1. Cirrhosis with stigmata of portal hypertension. 2. Gallbladder wall thickening and pericholecystic free fluid may be secondary to underlying liver disease. Acute cholecystitis is not excluded. Correlation with the prior ultrasound is recommended. 3. Trace bilateral pleural effusions. 4. Multiple enlarged right pelvic and right inguinal lymph nodes, likely reactive. Correlation for right lower extremity infectious process is recommended. Aortic Atherosclerosis (ICD10-I70.0). Electronically Signed   By: CConstance HolsterM.D.   On: 09/30/2020 16:04   MR FOOT RIGHT WO CONTRAST  Result Date: 09/28/2020 CLINICAL DATA:  Foot pain and swelling question of osteomyelitis for EXAM: MRI OF THE RIGHT FOREFOOT WITHOUT CONTRAST TECHNIQUE: Multiplanar, multisequence MR imaging of the right was performed. No intravenous contrast was administered. COMPARISON:  None. FINDINGS: The study is limited due to patient motion. Bones/Joint/Cartilage Increased T2 hyperintense signal seen within the fifth metatarsal head without definite associated T1 hypointensity. There is also increased signal seen at the fifth proximal phalanx. A small joint effusion seen at the fifth MTP joint. Ligaments Suboptimally visualized Muscles and Tendons There is increased signal seen within the muscles surrounding the forefoot. There is limited visualization of the tendons. Soft tissues Area of superficial ulceration with non loculated fluid seen overlying the fifth metatarsal head. There is diffuse subcutaneous edema. IMPRESSION:  Area of ulceration with phlegmon seen overlying the fifth metatarsal head. Probable reactive marrow seen throughout the fifth metatarsal head and proximal phalanx. The study is somewhat limited due to patient motion. Electronically Signed   By: BPrudencio PairM.D.   On: 09/28/2020 22:35   PERIPHERAL VASCULAR CATHETERIZATION  Result Date: 10/03/2020 See Op Note  DG Foot 2 Views Right  Result Date: 10/12/2020 CLINICAL DATA:  Status post transmetatarsal amputation today. EXAM: RIGHT FOOT - 2 VIEW COMPARISON:  Plain films of the  right foot 10/09/2020. FINDINGS: The patient has undergone first through fourth transmetatarsal amputation. There has also been extension of prior fifth metatarsal amputation. Antibiotic impregnated beads are in place at the surgical site. Surgical staples noted. No acute abnormality. IMPRESSION: Status post transmetatarsal amputation.  No acute finding. Electronically Signed   By: Inge Rise M.D.   On: 10/12/2020 16:40   DG Foot 2 Views Right  Result Date: 10/09/2020 CLINICAL DATA:  Status post amputation of the fifth ray today. EXAM: RIGHT FOOT - 2 VIEW COMPARISON:  Plain films of the right foot 09/27/2020. FINDINGS: The patient is status post amputation at the level of the proximal diaphysis of the fifth metatarsal. Surgical wound is noted and bandaging is in place. No acute abnormality. Talonavicular osteoarthritis noted. Antibiotic impregnated beads are seen in the plantar soft tissues at the surgical site. IMPRESSION: Postoperative change as described.  No acute finding. Electronically Signed   By: Inge Rise M.D.   On: 10/09/2020 14:14   DG Foot Complete Right  Result Date: 09/27/2020 CLINICAL DATA:  Fall EXAM: RIGHT FOOT COMPLETE - 3+ VIEW COMPARISON:  None. FINDINGS: Degenerative changes in the tarsal region. No acute bony abnormality. Specifically, no fracture, subluxation, or dislocation. Soft tissues are intact. IMPRESSION: No acute bony abnormality.  Electronically Signed   By: Rolm Baptise M.D.   On: 09/27/2020 19:26   DG MINI C-ARM IMAGE ONLY  Result Date: 10/11/2020 There is no interpretation for this exam.  This order is for images obtained during a surgical procedure.  Please See "Surgeries" Tab for more information regarding the procedure.   DG MINI C-ARM IMAGE ONLY  Result Date: 09/29/2020 There is no interpretation for this exam.  This order is for images obtained during a surgical procedure.  Please See "Surgeries" Tab for more information regarding the procedure.   [place] EKG SITE RITE  Result Date: 10/13/2020 If [facility] image not attached, placement could not be confirmed due to current cardiac rhythm.  US Abdomen Limited RUQ (LIVER/GB)  Addendum Date: 09/30/2020   ADDENDUM REPORT: 09/30/2020 10:07 ADDENDUM: Study discussed by telephone with Dr. Fritzi Mandes on 09/30/2020 at 1000 hours. Electronically Signed   By: Genevie Ann M.D.   On: 09/30/2020 10:07   Result Date: 09/30/2020 CLINICAL DATA:  50 year old male with abnormal transaminases. EXAM: ULTRASOUND ABDOMEN LIMITED RIGHT UPPER QUADRANT COMPARISON:  CT Abdomen and Pelvis 01/25/2017. FINDINGS: Gallbladder: Abnormal gallbladder wall thickening and/or edema with trace pericholecystic fluid (image 2). Combined the abnormal wall thickness is 5 mm or greater. Only occasional small gallstones are suspected, such as on image 15 measuring roughly 8 mm. Positive sonographic Murphy sign. Common bile duct: Diameter: 4 mm, normal. Liver: No intrahepatic biliary ductal dilatation identified. No discrete liver lesion. Background liver echogenicity within normal limits (images 32, 35). Questionable nodular liver contour (images 38, 41). Portal vein is patent on color Doppler imaging with normal direction of blood flow towards the liver. Other: Negative visible right kidney. IMPRESSION: 1. Generalized gallbladder edema and positive sonographic Murphy sign. Only occasional small gallstones identified. In  this setting differential considerations include gallbladder edema secondary to cirrhosis/hepatitis (see #2) versus Acute Calculus or Acalculous Cholecystitis. Follow-up nuclear medicine hepatobiliary scan may be valuable to evaluate patency of the cystic duct. 2. Mildly nodular liver contour raising the possibility of cirrhosis. No discrete liver lesion. 3. No evidence of biliary duct obstruction. Electronically Signed: By: Genevie Ann M.D. On: 09/30/2020 09:09       Subjective: Pt feels  well, pain is little better but still persistent when medication wears off.  No fever/chills.  No other acute complaints.  Carpal tunnel symptoms about the same.   Discharge Exam: Vitals:   10/14/20 0404 10/14/20 0752  BP: 116/70 107/69  Pulse: 78 72  Resp: 20 16  Temp: 97.9 F (36.6 C) 97.9 F (36.6 C)  SpO2: 100% 97%   Vitals:   10/13/20 1925 10/13/20 2343 10/14/20 0404 10/14/20 0752  BP: 116/67 116/71 116/70 107/69  Pulse: 80 75 78 72  Resp: _0 Temp: 98.7 F (37.1 C) 98.2 F (36.8 C) 97.9 F (36.6 C) 97.9 F (36.6 C)  TempSrc: Oral Oral Oral Oral  SpO2: 99% 99% 100% 97%  Weight:      Height:        General: Pt is alert, awake, not in acute distress Cardiovascular: RRR, S1/S2 +, no rubs, no gallops Respiratory: CTA bilaterally, no wheezing, no rhonchi Abdominal: Soft, NT, ND, bowel sounds + Extremities: R foot in boot, dressing intact, no edema, no cyanosis    The results of significant diagnostics from this hospitalization (including imaging, microbiology, ancillary and laboratory) are listed below for reference.     Microbiology: No results found for this or any previous visit (from the past 240 hour(s)).   Labs: BNP (last 3 results) No results for input(s): BNP in the last 8760 hours. Basic Metabolic Panel: Recent Labs  Lab 10/08/20 0348 10/09/20 0436 10/12/20 0308  NA 132* 134* 134*  K 3.9 4.2 4.8  CL 99 98 100  CO2 _1 GLUCOSE 179* 187* 295*  BUN _2 CREATININE 1.01 0.98 0.96  CALCIUM 8.5* 9.0 8.8*  MG 1.7 1.8 1.7  PHOS 3.8 3.6  --    Liver Function Tests: Recent Labs  Lab 10/08/20 0348 10/09/20 0436 10/12/20 0308  AST 32 35 36  ALT _3 ALKPHOS 227* 235* 183*  BILITOT 0.8 0.6 0.8  PROT 7.3 8.0 8.0  ALBUMIN 2.2* 2.4* 2.5*   No results for input(s): LIPASE, AMYLASE in the last 168 hours. No results for input(s): AMMONIA in the last 168 hours. CBC: Recent Labs  Lab 10/08/20 0348 10/09/20 0436 10/12/20 0308 10/14/20 0426  WBC 9.3 11.4* 13.9* 12.4*  NEUTROABS 5.4 6.6  --   --   HGB 12.6* 12.8* 13.4 11.9*  HCT 37.0* 38.7* 38.5* 35.2*  MCV 95.9 96.8 94.8 96.4  PLT 321 381 336 284   Cardiac Enzymes: No results for input(s): CKTOTAL, CKMB, CKMBINDEX, TROPONINI in the last 168 hours. BNP: Invalid input(s): POCBNP CBG: Recent Labs  Lab 10/13/20 0738 10/13/20 1218 10/13/20 1555 10/13/20 2040 10/14/20 0753  GLUCAP 189* 187* 206* 261* 145*   D-Dimer No results for input(s): DDIMER in the last 72 hours. Hgb A1c No results for input(s): HGBA1C in the last 72 hours. Lipid Profile No results for input(s): CHOL, HDL, LDLCALC, TRIG, CHOLHDL, LDLDIRECT in the last 72 hours. Thyroid function studies No results for input(s): TSH, T4TOTAL, T3FREE, THYROIDAB in the last 72 hours.  Invalid input(s): FREET3 Anemia work up No results for input(s): VITAMINB12, FOLATE, FERRITIN, TIBC, IRON, RETICCTPCT in the last 72 hours. Urinalysis    Component Value Date/Time   COLORURINE YELLOW 02/28/2010 2235   APPEARANCEUR CLEAR 02/28/2010 2235   LABSPEC 1.020 06/05/2015 1438   PHURINE 6.0 02/28/2010 2235   GLUCOSEU 500 (A) 06/05/2015 1438   HGBUR TRACE (A) 06/05/2015 1438   BILIRUBINUR Small 04/13/2015  [place] 02/28/2010 2235   PROTEINUR 100 (A) 06/05/2015 1438   UROBILINOGEN 4.0 04/13/2015 1516   UROBILINOGEN 1.0 02/28/2010 2235   NITRITE Negative 04/13/2015 1516   NITRITE NEGATIVE 02/28/2010  2235   LEUKOCYTESUR Negative 04/13/2015 1516   Sepsis Labs Invalid input(s): PROCALCITONIN,  WBC,  LACTICIDVEN Microbiology No results found for this or any previous visit (from the past 240 hour(s)).   Time coordinating discharge: Over 30 minutes  SIGNED:   Ezekiel Slocumb, DO [facility] 10/14/2020, 9:49 AM   If 7PM-7AM, please contact night-coverage [email or web address]

## 2020-10-14 NOTE — TOC Transition Note (Addendum)
Transition of Care [facility]) - CM/SW Discharge Note   Patient Details  Name: [name] MRN: [id number] Date of Birth: 04-07-1971  Transition of Care [facility]) CM/SW Contact:  Boris Sharper, LCSW Phone Number: 10/14/2020, 12:39 PM   Clinical Narrative:    Pt medically stable for discharge per MD. Pt will be transported home by a friend Patric Dykes. CSW confirmed set up of IV infusions with Carolynn Sayers and notified [place] with [facility] of discharge [facility] to follow for PT an RN.     Final next level of care: [place] Barriers to Discharge: No Barriers Identified   Patient Goals and CMS Choice Patient states their goals for this hospitalization and ongoing recovery are:: home with self care CMS Medicare.gov Compare Post Acute Care list provided to:: Patient Choice offered to / list presented to : Patient  Discharge Placement                  Name of family member notified: Patric Dykes Patient and family notified of of transfer: 10/14/20  Discharge Plan and Services                DME Arranged: IV pump/equipment DME Agency: Other - Comment ([facility]) Date DME Agency Contacted: 10/14/20 Time DME Agency Contacted: 1238 Representative spoke with at DME Agency: [place]: RN,PT [place] Agency: [facility] Date [place]: 10/14/20 Time [place]: 1237    Social Determinants of Health (SDOH) Interventions     Readmission Risk Interventions No flowsheet data found.

## 2020-10-15 NOTE — Anesthesia Postprocedure Evaluation (Signed)
Anesthesia Post Note  Patient: [name]  Procedure(s) Performed: AMPUTATION RAY (Right Foot) TENDON LENGTHENING (Right )  Patient location during evaluation: PACU Anesthesia Type: General Level of consciousness: awake and alert and oriented Pain management: pain level controlled Vital Signs Assessment: post-procedure vital signs reviewed and stable Respiratory status: spontaneous breathing Cardiovascular status: blood pressure returned to baseline Anesthetic complications: no   No complications documented.   Last Vitals:  Vitals:   10/14/20 1601 10/14/20 1951  BP: 106/67 119/80  Pulse: 76 81  Resp: 14 18  Temp: 37.1 C 36.6 C  SpO2: 100% 98%    Last Pain:  Vitals:   10/14/20 1951  TempSrc: Oral  PainSc:                  CARROLL,PAUL

## 2020-10-16 LAB — SURGICAL PATHOLOGY

## 2020-10-19 ENCOUNTER — Other Ambulatory Visit (INDEPENDENT_AMBULATORY_CARE_PROVIDER_SITE_OTHER): Payer: Self-pay | Admitting: Vascular Surgery

## 2020-10-19 DIAGNOSIS — S91309A Unspecified open wound, unspecified foot, initial encounter: Secondary | ICD-10-CM

## 2020-10-24 ENCOUNTER — Ambulatory Visit (INDEPENDENT_AMBULATORY_CARE_PROVIDER_SITE_OTHER): Payer: Self-pay | Admitting: Nurse Practitioner

## 2020-10-24 ENCOUNTER — Telehealth: Payer: Self-pay

## 2020-10-24 ENCOUNTER — Encounter (INDEPENDENT_AMBULATORY_CARE_PROVIDER_SITE_OTHER): Payer: Self-pay

## 2020-10-24 NOTE — Telephone Encounter (Signed)
Called pt to see if they need help with [facility] application following hospital referral. Unable to LVM.

## 2020-10-26 ENCOUNTER — Inpatient Hospital Stay: Payer: Self-pay | Admitting: Infectious Diseases

## 2020-10-30 ENCOUNTER — Emergency Department: Payer: Self-pay

## 2020-10-30 ENCOUNTER — Emergency Department
Admission: EM | Admit: 2020-10-30 | Discharge: 2020-10-30 | Disposition: A | Discharge status: Home / Self Care | Payer: Self-pay | Attending: Student in an Organized Health Care Education/Training Program | Admitting: Student in an Organized Health Care Education/Training Program

## 2020-10-30 ENCOUNTER — Other Ambulatory Visit: Payer: Self-pay

## 2020-10-30 DIAGNOSIS — F17210 Nicotine dependence, cigarettes, uncomplicated: Secondary | ICD-10-CM | POA: Insufficient documentation

## 2020-10-30 DIAGNOSIS — I10 Essential (primary) hypertension: Secondary | ICD-10-CM | POA: Insufficient documentation

## 2020-10-30 DIAGNOSIS — E1165 Type 2 diabetes mellitus with hyperglycemia: Secondary | ICD-10-CM | POA: Insufficient documentation

## 2020-10-30 DIAGNOSIS — Z79899 Other long term (current) drug therapy: Secondary | ICD-10-CM | POA: Insufficient documentation

## 2020-10-30 DIAGNOSIS — E1159 Type 2 diabetes mellitus with other circulatory complications: Secondary | ICD-10-CM | POA: Insufficient documentation

## 2020-10-30 DIAGNOSIS — M869 Osteomyelitis, unspecified: Secondary | ICD-10-CM | POA: Insufficient documentation

## 2020-10-30 DIAGNOSIS — R202 Paresthesia of skin: Secondary | ICD-10-CM | POA: Insufficient documentation

## 2020-10-30 DIAGNOSIS — I2510 Atherosclerotic heart disease of native coronary artery without angina pectoris: Secondary | ICD-10-CM | POA: Insufficient documentation

## 2020-10-30 DIAGNOSIS — Z794 Long term (current) use of insulin: Secondary | ICD-10-CM | POA: Insufficient documentation

## 2020-10-30 DIAGNOSIS — R0602 Shortness of breath: Secondary | ICD-10-CM | POA: Insufficient documentation

## 2020-10-30 DIAGNOSIS — E1169 Type 2 diabetes mellitus with other specified complication: Secondary | ICD-10-CM | POA: Insufficient documentation

## 2020-10-30 LAB — CBC
HCT: 37.4 % — ABNORMAL LOW (ref 39.0–52.0)
Hemoglobin: 13.1 g/dL (ref 13.0–17.0)
MCH: 32.7 pg (ref 26.0–34.0)
MCHC: 35 g/dL (ref 30.0–36.0)
MCV: 93.3 fL (ref 80.0–100.0)
Platelets: 197 10*3/uL (ref 150–400)
RBC: 4.01 MIL/uL — ABNORMAL LOW (ref 4.22–5.81)
RDW: 13.6 % (ref 11.5–15.5)
WBC: 7.5 10*3/uL (ref 4.0–10.5)
nRBC: 0 % (ref 0.0–0.2)

## 2020-10-30 LAB — D-DIMER, QUANTITATIVE: D-Dimer, Quant: 1.81 ug/mL-FEU — ABNORMAL HIGH (ref 0.00–0.50)

## 2020-10-30 LAB — BASIC METABOLIC PANEL
Anion gap: 8 (ref 5–15)
BUN: 17 mg/dL (ref 6–20)
CO2: 26 mmol/L (ref 22–32)
Calcium: 9.1 mg/dL (ref 8.9–10.3)
Chloride: 100 mmol/L (ref 98–111)
Creatinine, Ser: 0.87 mg/dL (ref 0.61–1.24)
GFR, Estimated: >60 mL/min (ref >60)
Glucose, Bld: 346 mg/dL — ABNORMAL HIGH (ref 70–99)
Potassium: 4 mmol/L (ref 3.5–5.1)
Sodium: 134 mmol/L — ABNORMAL LOW (ref 135–145)

## 2020-10-30 LAB — TROPONIN I (HIGH SENSITIVITY)
Troponin I (High Sensitivity): 7 ng/L (ref <18)
Troponin I (High Sensitivity): 8 ng/L (ref <18)

## 2020-10-30 MED ORDER — INSULIN ASPART 100 UNIT/ML ~~LOC~~ SOLN
6.0000 [IU] | Freq: Once | SUBCUTANEOUS | Status: AC
  Administered 2020-10-30: 6 [IU] via INTRAVENOUS
  Filled 2020-10-30: qty 1

## 2020-10-30 MED ORDER — ALBUTEROL SULFATE HFA 108 (90 BASE) MCG/ACT IN AERS: 2.0000 | INHALATION_SPRAY | Freq: Four times a day (QID) | RESPIRATORY_TRACT | 2 refills | Status: DC | PRN

## 2020-10-30 MED ORDER — SODIUM CHLORIDE 0.9 % IV BOLUS
500.0000 mL | Freq: Once | INTRAVENOUS | Status: AC
  Administered 2020-10-30: 500 mL via INTRAVENOUS

## 2020-10-30 MED ORDER — IOHEXOL 350 MG/ML SOLN
75.0000 mL | Freq: Once | INTRAVENOUS | Status: AC | PRN
  Administered 2020-10-30: 75 mL via INTRAVENOUS
  Filled 2020-10-30: qty 75

## 2020-10-30 NOTE — ED Provider Notes (Signed)
[facility] Emergency Department Provider Note    Event Date/Time   First MD Initiated Contact with Patient 10/30/20 1543     (approximate)  I have reviewed the triage vital signs and the nursing notes.   HISTORY  Chief Complaint Shortness of Breath and Abdominal Pain    HPI [name] is a 50 y.o. male the below listed past medical history presents to the ER for evaluation of tingling sensation is associated with shortness of breath.  He does not particular describe abdominal pain.  States it feels like the neuropathy in his legs have gone up into the abdomen and with the symptoms he started having shortness of breath and chest discomfort.  Is never had symptoms like this before.  States his blood sugars have been elevated over the past few days.  He is denying any nausea or vomiting.  No recent injury or trauma.  Denies any back pain.  He is not currently on any blood thinners.    Past Medical History:  Diagnosis Date  . Diabetes mellitus without complication ([place])   . Heart disease   . Myocardial infarction [facility])    Family History  Problem Relation Age of Onset  . Diabetes Father   . Heart disease Father   . Breast cancer Mother   . Lung cancer Paternal Grandmother    Past Surgical History:  Procedure Laterality Date  . AMPUTATION Right 10/01/2020   Procedure: AMPUTATION 5th  RAY AND IRRIGATION AND DEBRIDEMENT;  Surgeon: Samara Deist, DPM;  Location: [facility] ORS;  Service: Podiatry;  Laterality: Right;  . AMPUTATION Right 10/11/2020   Procedure: AMPUTATION RAY;  Surgeon: Caroline More, DPM;  Location: [facility] ORS;  Service: Podiatry;  Laterality: Right;  . BACK SURGERY    . INCISION AND DRAINAGE Right 09/29/2020   Procedure: INCISION AND DRAINAGE, RIGHT FOOT;  Surgeon: Samara Deist, DPM;  Location: [facility] ORS;  Service: Podiatry;  Laterality: Right;  . LOWER EXTREMITY ANGIOGRAPHY Right 10/03/2020   Procedure: Lower Extremity Angiography;  Surgeon:  Katha Cabal, MD;  Location: [facility];  Service: Cardiovascular;  Laterality: Right;  . TENDON LENGTHENING Right 10/11/2020   Procedure: TENDON LENGTHENING;  Surgeon: Caroline More, DPM;  Location: [facility] ORS;  Service: Podiatry;  Laterality: Right;   Patient Active Problem List   Diagnosis Date Noted  . 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[place]) 10/13/2020  . Sepsis ([place]) 10/08/2020  . Cellulitis in diabetic foot ([place]) 09/27/2020  . Transaminitis 09/27/2020  . Coronary artery disease involving native coronary artery of native heart without angina pectoris 12/06/2015  . Uncontrolled type 2 diabetes mellitus with circulatory disorder, without long-term current use of insulin ([place]) 12/06/2015  . Preventative health care 12/06/2015  . Essential hypertension 12/06/2015  . Hyperlipidemia 12/06/2015  . Tobacco abuse 12/06/2015      Prior to Admission medications   Medication Sig Start Date End Date Taking? Authorizing Provider  albuterol (VENTOLIN HFA) 108 (90 Base) MCG/ACT inhaler Inhale 2 puffs into the lungs every 6 (six) hours as needed for wheezing or shortness of breath. 10/30/20  Yes Merlyn Lot, MD  acetaminophen (TYLENOL) 325 MG tablet Take 2 tablets (650 mg total) by mouth every 4 (four) hours as needed for mild pain, fever or headache. 10/13/20   Nicole Kindred A, DO  atorvastatin (LIPITOR) 40 MG tablet Take 1 tablet (40 mg total) by mouth daily. 12/06/15   Coral Spikes, DO  Blood Glucose Monitoring Suppl (RELION  PRIME MONITOR) DEVI Use up to 4 times daily to check blood sugars. 12/06/15   Coral Spikes, DO  carvedilol (COREG) 3.125 MG tablet Take 1 tablet (3.125 mg total) by mouth 2 (two) times daily with a meal. 10/13/20   Nicole Kindred A, DO  cefTRIAXone (ROCEPHIN) IVPB Inject 2 g into the vein daily for 25 days. Indication: Diabetic foot infection and osteomyelitis First Dose: Yes Last Day of Therapy:  11/08/20 Labs - Once weekly (Monday):  CBC/D and  CMP, Remove PICC at completion of antibiotics Method of administration: IV Push Method of administration may be changed at the discretion of home infusion pharmacist based upon assessment of the patient and/or caregiver's ability to self-administer the medication ordered. 10/14/20 11/08/20  Ezekiel Slocumb, DO  fluticasone (FLONASE) 50 MCG/ACT nasal spray Place 2 sprays into both nostrils daily. 10/14/20   Ezekiel Slocumb, DO  gabapentin (NEURONTIN) 300 MG capsule Take 2 capsules (600 mg total) by mouth at bedtime. 10/13/20   Ezekiel Slocumb, DO  gabapentin (NEURONTIN) 300 MG capsule Take 1 capsule (300 mg total) by mouth every morning. 10/13/20   Nicole Kindred A, DO  glucose blood (RELION PRIME TEST) test strip Use as instructed 12/06/15   Coral Spikes, DO  Insulin Glargine (BASAGLAR KWIKPEN) 100 UNIT/ML Inject 30 Units into the skin daily. 10/13/20   Ezekiel Slocumb, DO  insulin lispro (HUMALOG) 100 UNIT/ML KwikPen Inject 11 Units into the skin 3 (three) times daily with meals. 10/13/20   Ezekiel Slocumb, DO  Insulin Pen Needle (PEN NEEDLES) 32G X 6 MM MISC 1 each by Does not apply route 4 (four) times daily -  before meals and at bedtime. 10/13/20   Nicole Kindred A, DO  lisinopril (PRINIVIL,ZESTRIL) 5 MG tablet TAKE ONE TABLET BY MOUTH ONCE DAILY 01/27/17   Coral Spikes, DO  meloxicam (MOBIC) 7.5 MG tablet Take 7.5 mg by mouth 2 (two) times daily. 09/10/20   [provider]  metFORMIN (GLUCOPHAGE) 500 MG tablet TAKE ONE TABLET BY MOUTH TWICE DAILY WITH A MEAL 01/27/17   Cook, Jayce G, DO  methocarbamol (ROBAXIN) 750 MG tablet Take 1 tablet (750 mg total) by mouth every 6 (six) hours as needed for muscle spasms. 10/13/20   Ezekiel Slocumb, DO  ReliOn Ultra Thin Lancets MISC Use as directed to take blood sugar. 12/06/15   Coral Spikes, DO  sildenafil (VIAGRA) 25 MG tablet Take 1 tablet by mouth daily. 09/10/20   [provider]    Allergies Patient has no known  allergies.    Social History Social History   Tobacco Use  . Smoking status: Current Every Day Smoker    Packs/day: 1.00    Types: Cigarettes  . Smokeless tobacco: Never Used  Substance Use Topics  . Alcohol use: Yes    Alcohol/week: 0.0 - 1.0 standard drinks  . Drug use: No    Review of Systems Patient denies headaches, rhinorrhea, blurry vision, numbness, shortness of breath, chest pain, edema, cough, abdominal pain, nausea, vomiting, diarrhea, dysuria, fevers, rashes or hallucinations unless otherwise stated above in HPI. ____________________________________________   PHYSICAL EXAM:  VITAL SIGNS: Vitals:   10/30/20 1448 10/30/20 1719  BP: 125/85 140/88  Pulse: 95 88  Resp: 18 16  Temp: 97.9 F (36.6 C)   SpO2: 100% 98%    Constitutional: Alert and oriented.  Eyes: Conjunctivae are normal.  Head: Atraumatic. Nose: No congestion/rhinnorhea. Mouth/Throat: Mucous membranes are moist.   Neck:  No stridor. Painless ROM.  Cardiovascular: Normal rate, regular rhythm. Grossly normal heart sounds.  Good peripheral circulation. Respiratory: Normal respiratory effort.  No retractions. Lungs CTAB. Gastrointestinal: Soft and nontender. No distention. No abdominal bruits. No CVA tenderness. Genitourinary:  Musculoskeletal: No lower extremity tenderness nor edema.  Neurologic:  Normal speech and language. No gross focal neurologic deficits are appreciated. No facial droop Skin:  Skin is warm, dry and intact. No rash noted. Psychiatric: Mood and affect are normal. Speech and behavior are normal.  ____________________________________________   LABS (all labs ordered are listed, but only abnormal results are displayed)  Results for orders placed or performed during the hospital encounter of 10/30/20 (from the past 24 hour(s))  CBC     Status: Abnormal   Collection Time: 10/30/20  2:52 PM  Result Value Ref Range   WBC 7.5 4.0 - 10.5 K/uL   RBC 4.01 (L) 4.22 - 5.81 MIL/uL    Hemoglobin 13.1 13.0 - 17.0 g/dL   HCT 37.4 (L) 39.0 - 52.0 %   MCV 93.3 80.0 - 100.0 fL   MCH 32.7 26.0 - 34.0 pg   MCHC 35.0 30.0 - 36.0 g/dL   RDW 13.6 11.5 - 15.5 %   Platelets 197 150 - 400 K/uL   nRBC 0.0 0.0 - 0.2 %  Basic metabolic panel     Status: Abnormal   Collection Time: 10/30/20  2:52 PM  Result Value Ref Range   Sodium 134 (L) 135 - 145 mmol/L   Potassium 4.0 3.5 - 5.1 mmol/L   Chloride 100 98 - 111 mmol/L   CO2 26 22 - 32 mmol/L   Glucose, Bld 346 (H) 70 - 99 mg/dL   BUN 17 6 - 20 mg/dL   Creatinine, Ser 0.87 0.61 - 1.24 mg/dL   Calcium 9.1 8.9 - 10.3 mg/dL   GFR, Estimated >60 >60 mL/min   Anion gap 8 5 - 15  Troponin I (High Sensitivity)     Status: None   Collection Time: 10/30/20  4:23 PM  Result Value Ref Range   Troponin I (High Sensitivity) 7 <18 ng/L  D-dimer, quantitative     Status: Abnormal   Collection Time: 10/30/20  4:23 PM  Result Value Ref Range   D-Dimer, Quant 1.81 (H) 0.00 - 0.50 ug/mL-FEU  Troponin I (High Sensitivity)     Status: None   Collection Time: 10/30/20  6:32 PM  Result Value Ref Range   Troponin I (High Sensitivity) 8 <18 ng/L   ____________________________________________  EKG My review and personal interpretation at Time: 14:51   Indication: sob  Rate: 95  Rhythm: sinus Axis: right Other: normal intervals, no stemi, no depressions ____________________________________________  RADIOLOGY  I personally reviewed all radiographic images ordered to evaluate for the above acute complaints and reviewed radiology reports and findings.  These findings were personally discussed with the patient.  Please see medical record for radiology report.  ____________________________________________   PROCEDURES  Procedure(s) performed:  Procedures    Critical Care performed: no ____________________________________________   INITIAL IMPRESSION / ASSESSMENT AND PLAN / ED COURSE  Pertinent labs & imaging results that were  available during my care of the patient were reviewed by me and considered in my medical decision making (see chart for details).   DDX: Asthma, copd, CHF, pna, ptx, malignancy, Pe, anemia   [name] is a 50 y.o. who presents to the ED with presentation as described above.  Patient nontoxic-appearing but has have recently complicated  hospitalization medical history currently on antibiotics.  He is afebrile.  EKG is nonischemic symptoms will be atypical for ACS but does have history of cardiac disease therefore will observe in order serial enzymes.  His initial troponin is negative.  D-dimer was sent to further stratify for PE and it was elevated therefore CTA was ordered.  Is not having any signs or symptoms of DVT.  His abdominal exam is soft and benign.  CTA is without any evidence of PE or pneumonia.   D-dimer likely 2/2 recent infection. May have mild emphysema but not having symptoms of COPD exacerbation.  Serial enzymes were negative.  Do believe patient stable and appropriate for outpatient follow-up.  Patient agreeable to plan peer     The patient was evaluated in Emergency Department today for the symptoms described in the history of present illness. He/she was evaluated in the context of the global COVID-19 pandemic, which necessitated consideration that the patient might be at risk for infection with the SARS-CoV-2 virus that causes COVID-19. Institutional protocols and algorithms that pertain to the evaluation of patients at risk for COVID-19 are in a state of rapid change based on information released by regulatory bodies including the CDC and federal and state organizations. These policies and algorithms were followed during the patient's care in the ED.  As part of my medical decision making, I reviewed the following data within the [facility] notes reviewed and incorporated, Labs reviewed, notes from prior ED visits and [place] Controlled Substance  Database   ____________________________________________   FINAL CLINICAL IMPRESSION(S) / ED DIAGNOSES  Final diagnoses:  Shortness of breath      NEW MEDICATIONS STARTED DURING THIS VISIT:  New Prescriptions   ALBUTEROL (VENTOLIN HFA) 108 (90 BASE) MCG/ACT INHALER    Inhale 2 puffs into the lungs every 6 (six) hours as needed for wheezing or shortness of breath.     Note:  This document was prepared using Dragon voice recognition software and may include unintentional dictation errors.    Merlyn Lot, MD 10/30/20 [phone]

## 2020-10-30 NOTE — ED Triage Notes (Signed)
Pt comes with c/o SOB and some belly pain. Pt states the SOB started yesterday and got worse this am  Pt states belly pain that started couple weeks ago. Pt states he feels numb from waist down. 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  Pt also had recent amputation and has PIC line in placed to right upper arm.

## 2020-11-02 ENCOUNTER — Encounter: Payer: Self-pay | Admitting: Infectious Diseases

## 2020-11-02 ENCOUNTER — Ambulatory Visit: Payer: Self-pay | Attending: Infectious Diseases | Admitting: Infectious Diseases

## 2020-11-02 ENCOUNTER — Other Ambulatory Visit: Payer: Self-pay

## 2020-11-02 VITALS — BP 133/83 | HR 86 | Resp 16 | Ht 74.0 in | Wt 220.0 lb

## 2020-11-02 DIAGNOSIS — T8743 Infection of amputation stump, right lower extremity: Secondary | ICD-10-CM | POA: Insufficient documentation

## 2020-11-02 DIAGNOSIS — L089 Local infection of the skin and subcutaneous tissue, unspecified: Secondary | ICD-10-CM

## 2020-11-02 DIAGNOSIS — F17210 Nicotine dependence, cigarettes, uncomplicated: Secondary | ICD-10-CM | POA: Insufficient documentation

## 2020-11-02 DIAGNOSIS — Z7984 Long term (current) use of oral hypoglycemic drugs: Secondary | ICD-10-CM | POA: Insufficient documentation

## 2020-11-02 DIAGNOSIS — Z792 Long term (current) use of antibiotics: Secondary | ICD-10-CM | POA: Insufficient documentation

## 2020-11-02 DIAGNOSIS — Z794 Long term (current) use of insulin: Secondary | ICD-10-CM | POA: Insufficient documentation

## 2020-11-02 DIAGNOSIS — E11628 Type 2 diabetes mellitus with other skin complications: Secondary | ICD-10-CM

## 2020-11-02 DIAGNOSIS — Y838 Other surgical procedures as the cause of abnormal reaction of the patient, or of later complication, without mention of misadventure at the time of the procedure: Secondary | ICD-10-CM | POA: Insufficient documentation

## 2020-11-02 DIAGNOSIS — I10 Essential (primary) hypertension: Secondary | ICD-10-CM | POA: Insufficient documentation

## 2020-11-02 DIAGNOSIS — Z959 Presence of cardiac and vascular implant and graft, unspecified: Secondary | ICD-10-CM | POA: Insufficient documentation

## 2020-11-02 DIAGNOSIS — E1165 Type 2 diabetes mellitus with hyperglycemia: Secondary | ICD-10-CM | POA: Insufficient documentation

## 2020-11-02 DIAGNOSIS — Z79899 Other long term (current) drug therapy: Secondary | ICD-10-CM | POA: Insufficient documentation

## 2020-11-02 DIAGNOSIS — Z791 Long term (current) use of non-steroidal anti-inflammatories (NSAID): Secondary | ICD-10-CM | POA: Insufficient documentation

## 2020-11-02 DIAGNOSIS — Z89431 Acquired absence of right foot: Secondary | ICD-10-CM

## 2020-11-02 NOTE — Patient Instructions (Addendum)
You are here for follow up of the rt foot infection- you had undergone TMA and on ceftriaxone until 11/08/20.  We can stop the antibiotic a day earlier and remove the PICC on 11/07/20 in my office- tomorrow you have an appt to see Dr.Fowler for suture removal.

## 2020-11-02 NOTE — Progress Notes (Signed)
NAME: [name]  DOB: 25-Sep-1970  MRN: [id number]  Date/Time: 11/02/2020 10:27 AM   Subjective:  ? [name] is a 50 y.o.male  with a history of DM , HTN Admitted to [facility] between 09/27/20-10/14/20  for rt foot infection due to poorly controlled DM- He underwent TMA on 10/11/20 after initial I/D and 5th ray amputation on 10/01/20 along with antibiotics  was not adequate to control the infection. He was discharged home on Iv ceftriaxone  For 4 weeks. He does not have insurance and [facility] was arranged but they could not reach him on his phone and hence have discontinued service He is here for follow up Since his discharge he went to ED once for sob on 10/30/20 and pain and PE was ruled out. It was thought to be COPD He is doing better No fever or chills No diarrhea He is staying at his sister's place and she is the one administering the antibiotic- No missed dose Has not found a PCP yet at [facility]   Past Medical History:  Diagnosis Date  . Diabetes mellitus without complication ([place])   . Heart disease   . Myocardial infarction [facility])     Past Surgical History:  Procedure Laterality Date  . AMPUTATION Right 10/01/2020   Procedure: AMPUTATION 5th  RAY AND IRRIGATION AND DEBRIDEMENT;  Surgeon: Samara Deist, DPM;  Location: [facility] ORS;  Service: Podiatry;  Laterality: Right;  . AMPUTATION Right 10/11/2020   Procedure: AMPUTATION RAY;  Surgeon: Caroline More, DPM;  Location: [facility] ORS;  Service: Podiatry;  Laterality: Right;  . BACK SURGERY    . INCISION AND DRAINAGE Right 09/29/2020   Procedure: INCISION AND DRAINAGE, RIGHT FOOT;  Surgeon: Samara Deist, DPM;  Location: [facility] ORS;  Service: Podiatry;  Laterality: Right;  . LOWER EXTREMITY ANGIOGRAPHY Right 10/03/2020   Procedure: Lower Extremity Angiography;  Surgeon: Katha Cabal, MD;  Location: [place] [facility];  Service: Cardiovascular;  Laterality: Right;  . TENDON LENGTHENING Right 10/11/2020   Procedure:  TENDON LENGTHENING;  Surgeon: Caroline More, DPM;  Location: [facility] ORS;  Service: Podiatry;  Laterality: Right;    Social History   Socioeconomic History  . Marital status: Single    Spouse name: Not on file  . Number of children: Not on file  . Years of education: Not on file  . Highest education level: Not on file  Occupational History  . Not on file  Tobacco Use  . Smoking status: Current Every Day Smoker    Packs/day: 1.00    Types: Cigarettes  . Smokeless tobacco: Never Used  Substance and Sexual Activity  . Alcohol use: Yes    Alcohol/week: 0.0 - 1.0 standard drinks  . Drug use: No  . Sexual activity: Not on file  Other Topics Concern  . Not on file  Social History Narrative  . Not on file   Social Determinants of Health   Financial Resource Strain: Not on file  Food Insecurity: Not on file  Transportation Needs: Not on file  Physical Activity: Not on file  Stress: Not on file  Social Connections: Not on file  Intimate Partner Violence: Not on file    Family History  Problem Relation Age of Onset  . Diabetes Father   . Heart disease Father   . Breast cancer Mother   . Lung cancer Paternal Grandmother    No Known Allergies I? Current Outpatient Medications  Medication Sig Dispense Refill  . acetaminophen (TYLENOL)  325 MG tablet Take 2 tablets (650 mg total) by mouth every 4 (four) hours as needed for mild pain, fever or headache.    . albuterol (VENTOLIN HFA) 108 (90 Base) MCG/ACT inhaler Inhale 2 puffs into the lungs every 6 (six) hours as needed for wheezing or shortness of breath. 8 g 2  . atorvastatin (LIPITOR) 40 MG tablet Take 1 tablet (40 mg total) by mouth daily. 90 tablet 3  . Blood Glucose Monitoring Suppl (RELION PRIME MONITOR) DEVI Use up to 4 times daily to check blood sugars. 1 Device 0  . carvedilol (COREG) 3.125 MG tablet Take 1 tablet (3.125 mg total) by mouth 2 (two) times daily with a meal. 30 tablet 2  . cefTRIAXone (ROCEPHIN) IVPB Inject 2  g into the vein daily for 25 days. Indication: Diabetic foot infection and osteomyelitis First Dose: Yes Last Day of Therapy:  11/08/20 Labs - Once weekly (Monday):  CBC/D and CMP, Remove PICC at completion of antibiotics Method of administration: IV Push Method of administration may be changed at the discretion of home infusion pharmacist based upon assessment of the patient and/or caregiver's ability to self-administer the medication ordered. 26 Units 0  . fluticasone (FLONASE) 50 MCG/ACT nasal spray Place 2 sprays into both nostrils daily. 9.9 mL 2  . gabapentin (NEURONTIN) 300 MG capsule Take 2 capsules (600 mg total) by mouth at bedtime. 30 capsule 2  . glucose blood (RELION PRIME TEST) test strip Use as instructed 100 each 12  . Insulin Glargine (BASAGLAR KWIKPEN) 100 UNIT/ML Inject 30 Units into the skin daily. 15 mL 3  . insulin lispro (HUMALOG) 100 UNIT/ML KwikPen Inject 11 Units into the skin 3 (three) times daily with meals. 15 mL 3  . Insulin Pen Needle (PEN NEEDLES) 32G X 6 MM MISC 1 each by Does not apply route 4 (four) times daily -  before meals and at bedtime. 100 each 2  . lisinopril (PRINIVIL,ZESTRIL) 5 MG tablet TAKE ONE TABLET BY MOUTH ONCE DAILY 30 tablet 0  . meloxicam (MOBIC) 7.5 MG tablet Take 7.5 mg by mouth 2 (two) times daily.    . metFORMIN (GLUCOPHAGE) 500 MG tablet TAKE ONE TABLET BY MOUTH TWICE DAILY WITH A MEAL 30 tablet 0  . methocarbamol (ROBAXIN) 750 MG tablet Take 1 tablet (750 mg total) by mouth every 6 (six) hours as needed for muscle spasms. 30 tablet 1  . ReliOn Ultra Thin Lancets MISC Use as directed to take blood sugar. 200 each 0  . sildenafil (VIAGRA) 25 MG tablet Take 1 tablet by mouth daily.     No current facility-administered medications for this visit.     Abtx:  Anti-infectives (From admission, onward)   None      REVIEW OF SYSTEMS:  Const: negative fever, negative chills, negative weight loss Eyes: negative diplopia or visual changes,  negative eye pain ENT: negative coryza, negative sore throat Resp: negative cough, hemoptysis, dyspnea Cards: negative for chest pain, palpitations, lower extremity edema GU: negative for frequency, dysuria and hematuria GI: Negative for abdominal pain, diarrhea, bleeding, constipation Skin: negative for rash and pruritus Heme: negative for easy bruising and gum/nose bleeding MO:LMBEM off balance when walking Neurolo:negative for headaches, dizziness, vertigo, memory problems  Psych: negative for feelings of anxiety, depression  Endocrine:  diabetes Allergy/Immunology- negative for any medication or food allergies ?  Objective:  VITALS:  BP 133/83   Pulse 86   Resp 16   Ht 6' 2"  (1.88 m)  Wt 220 lb (99.8 kg)   SpO2 98%   BMI 28.25 kg/m  PHYSICAL EXAM:  General: Alert, cooperative, no distress, appears stated age. In wheel chair- pale Head: Normocephalic, without obvious abnormality, atraumatic. Lungs: Clear to auscultation bilaterally. No Wheezing or Rhonchi. No rales. Heart: Regular rate and rhythm, no murmur, rub or gallop. Abdomen: Soft,  Extremities:  RT PICC site clean  rt foot dressing removed The wound is healing well- TMA site looks fine- sutures and staples seen 11/02/20   11/02/20   11/02/20   10/12/20    10/03/20      Skin: No rashes or lesions. Or bruising Lymph: Cervical, supraclavicular normal. Neurologic: Grossly non-focal Pertinent Labs Lab Results CBC    Component Value Date/Time   WBC 7.5 10/30/2020 1452   RBC 4.01 (L) 10/30/2020 1452   HGB 13.1 10/30/2020 1452   HCT 37.4 (L) 10/30/2020 1452   PLT 197 10/30/2020 1452   MCV 93.3 10/30/2020 1452   MCH 32.7 10/30/2020 1452   MCHC 35.0 10/30/2020 1452   RDW 13.6 10/30/2020 1452   LYMPHSABS 3.7 10/09/2020 0436   MONOABS 0.7 10/09/2020 0436   EOSABS 0.2 10/09/2020 0436   BASOSABS 0.1 10/09/2020 0436    CMP Latest Ref Rng & Units 10/30/2020 10/12/2020 10/09/2020  Glucose 70 - 99 mg/dL  346(H) 295(H) 187(H)  BUN 6 - 20 mg/dL 17 19 19   Creatinine 0.61 - 1.24 mg/dL 0.87 0.96 0.98  Sodium 135 - 145 mmol/L 134(L) 134(L) 134(L)  Potassium 3.5 - 5.1 mmol/L 4.0 4.8 4.2  Chloride 98 - 111 mmol/L 100 100 98  CO2 22 - 32 mmol/L 26 26 28   Calcium 8.9 - 10.3 mg/dL 9.1 8.8(L) 9.0  Total Protein 6.5 - 8.1 g/dL - 8.0 8.0  Total Bilirubin 0.3 - 1.2 mg/dL - 0.8 0.6  Alkaline Phos 38 - 126 U/L - 183(H) 235(H)  AST 15 - 41 U/L - 36 35  ALT 0 - 44 U/L - 25 26    ? Impression/Recommendation ? ?Diabetic foot infection rt foot- s/p TMA on 10/12/20- on IV ceftriaxone and wound healing well Will complete IV on 11/07/20 and will remove PICC in my office Has a follow up with Dr.Fowler tomorrow for suture removal- may decide after that on PO antibiotics  DM- on insulin/metformin- need to engage in care at [place] Current smoker- told him that his wound will not heal if continues to smoke- Says he has reduced it  HTN- lisinopril ? __follow up next week- will do labs next week _________________________________________________ Discussed with patient, Note:  This document was prepared using Dragon voice recognition software and may include unintentional dictation errors.

## 2020-11-07 ENCOUNTER — Other Ambulatory Visit: Payer: Self-pay

## 2020-11-07 ENCOUNTER — Other Ambulatory Visit: Payer: Self-pay | Admitting: Infectious Diseases

## 2020-11-07 ENCOUNTER — Encounter: Payer: Self-pay | Admitting: Infectious Diseases

## 2020-11-07 ENCOUNTER — Ambulatory Visit: Payer: Self-pay | Attending: Infectious Diseases | Admitting: Infectious Diseases

## 2020-11-07 VITALS — BP 127/77 | HR 62 | Temp 97.3°F

## 2020-11-07 DIAGNOSIS — Z794 Long term (current) use of insulin: Secondary | ICD-10-CM | POA: Insufficient documentation

## 2020-11-07 DIAGNOSIS — L089 Local infection of the skin and subcutaneous tissue, unspecified: Secondary | ICD-10-CM

## 2020-11-07 DIAGNOSIS — I10 Essential (primary) hypertension: Secondary | ICD-10-CM | POA: Insufficient documentation

## 2020-11-07 DIAGNOSIS — E11628 Type 2 diabetes mellitus with other skin complications: Secondary | ICD-10-CM | POA: Insufficient documentation

## 2020-11-07 DIAGNOSIS — Z7901 Long term (current) use of anticoagulants: Secondary | ICD-10-CM | POA: Insufficient documentation

## 2020-11-07 DIAGNOSIS — F17210 Nicotine dependence, cigarettes, uncomplicated: Secondary | ICD-10-CM | POA: Insufficient documentation

## 2020-11-07 MED ORDER — AMOXICILLIN-POT CLAVULANATE 875-125 MG PO TABS: 1.0000 | ORAL_TABLET | Freq: Two times a day (BID) | ORAL | 0 refills | Status: DC

## 2020-11-07 NOTE — Progress Notes (Signed)
NAME: [name]  DOB: 10-01-1970  MRN: [id number]  Date/Time: 11/07/2020 1:53 PM   Subjective:  ?Here for follow up and PICC removal Completed IV ceftriaxone today.total of 6 weeks of antibiotic from admission, 5 weeks from 1st amputation and 4 weeks from the TMA He is doing okay No fever or chills Foot healing well except for one area he says Sugar is around 162- was 465 couple of weeks ago Still smoking but has decreased to a pack Yet to establish care with [facility] Following is taken from last visit [name] is a 50 y.o.male  with a history of DM , HTN Admitted to [facility] between 09/27/20-10/14/20  for rt foot infection due to poorly controlled DM- He underwent TMA on 10/11/20 after initial I/D and 5th ray amputation on 10/01/20 along with antibiotics  was not adequate to control the infection. Pathology did not show any osteomyelitis- there was necrosis of the wound. Culture grew GBSHe was discharged home on Iv ceftriaxone  For 4 weeks. He does not have insurance and [facility] was arranged but they could not reach him on his phone and hence have discontinued service Since his discharge he went to ED once for sob on 10/30/20 and pain and PE was ruled out. It was thought to be COPD    Past Medical History:  Diagnosis Date  . Diabetes mellitus without complication ([place])   . Heart disease   . Myocardial infarction [facility])     Past Surgical History:  Procedure Laterality Date  . AMPUTATION Right 10/01/2020   Procedure: AMPUTATION 5th  RAY AND IRRIGATION AND DEBRIDEMENT;  Surgeon: Samara Deist, DPM;  Location: [facility] OR[facility];  Service: Podiatry;  Laterality: Right;  . AMPUTATION Right 10/11/2020   Procedure: AMPUTATION RAY;  Surgeon: Caroline More, DPM;  Location: [facility] OR[facility];  Service: Podiatry;  Laterality: Right;  . BACK SURGERY    . INCISION AND DRAINAGE Right 09/29/2020   Procedure: INCISION AND DRAINAGE, RIGHT FOOT;  Surgeon: Samara Deist, DPM;  Location: [facility] OR[facility];   Service: Podiatry;  Laterality: Right;  . LOWER EXTREMITY ANGIOGRAPHY Right 10/03/2020   Procedure: Lower Extremity Angiography;  Surgeon: Katha Cabal, MD;  Location: [place] [facility];  Service: Cardiovascular;  Laterality: Right;  . TENDON LENGTHENING Right 10/11/2020   Procedure: TENDON LENGTHENING;  Surgeon: Caroline More, DPM;  Location: [facility];  Service: Podiatry;  Laterality: Right;    Social History   Socioeconomic History  . Marital status: Single    Spouse name: Not on file  . Number of children: Not on file  . Years of education: Not on file  . Highest education level: Not on file  Occupational History  . Not on file  Tobacco Use  . Smoking status: Current Every Day Smoker    Packs/day: 1.00    Types: Cigarettes  . Smokeless tobacco: Never Used  Substance and Sexual Activity  . Alcohol use: Yes    Alcohol/week: 0.0 - 1.0 standard drinks  . Drug use: No  . Sexual activity: Not on file  Other Topics Concern  . Not on file  Social History Narrative  . Not on file   Social Determinants of Health   Financial Resource Strain: Not on file  Food Insecurity: Not on file  Transportation Needs: Not on file  Physical Activity: Not on file  Stress: Not on file  Social Connections: Not on file  Intimate Partner Violence: Not on file    Family History  Problem Relation Age of Onset  . Diabetes Father   . Heart disease Father   . Breast cancer Mother   . Lung cancer Paternal Grandmother    No Known Allergies I? Current Outpatient Medications  Medication Sig Dispense Refill  . acetaminophen (TYLENOL) 325 MG tablet Take 2 tablets (650 mg total) by mouth every 4 (four) hours as needed for mild pain, fever or headache.    . albuterol (VENTOLIN HFA) 108 (90 Base) MCG/ACT inhaler Inhale 2 puffs into the lungs every 6 (six) hours as needed for wheezing or shortness of breath. 8 g 2  . atorvastatin (LIPITOR) 40 MG tablet Take 1 tablet (40 mg total) by mouth daily.  90 tablet 3  . Blood Glucose Monitoring Suppl (RELION PRIME MONITOR) DEVI Use up to 4 times daily to check blood sugars. 1 Device 0  . carvedilol (COREG) 3.125 MG tablet Take 1 tablet (3.125 mg total) by mouth 2 (two) times daily with a meal. 30 tablet 2  . cefTRIAXone (ROCEPHIN) IVPB Inject 2 g into the vein daily for 25 days. Indication: Diabetic foot infection and osteomyelitis First Dose: Yes Last Day of Therapy:  11/08/20 Labs - Once weekly (Monday):  CBC/D and CMP, Remove PICC at completion of antibiotics Method of administration: IV Push Method of administration may be changed at the discretion of home infusion pharmacist based upon assessment of the patient and/or caregiver's ability to self-administer the medication ordered. 26 Units 0  . fluticasone (FLONASE) 50 MCG/ACT nasal spray Place 2 sprays into both nostrils daily. 9.9 mL 2  . gabapentin (NEURONTIN) 300 MG capsule Take 2 capsules (600 mg total) by mouth at bedtime. 30 capsule 2  . glucose blood (RELION PRIME TEST) test strip Use as instructed 100 each 12  . Insulin Glargine (BASAGLAR KWIKPEN) 100 UNIT/ML Inject 30 Units into the skin daily. 15 mL 3  . insulin lispro (HUMALOG) 100 UNIT/ML KwikPen Inject 11 Units into the skin 3 (three) times daily with meals. 15 mL 3  . Insulin Pen Needle (PEN NEEDLES) 32G X 6 MM MISC 1 each by Does not apply route 4 (four) times daily -  before meals and at bedtime. 100 each 2  . lisinopril (PRINIVIL,ZESTRIL) 5 MG tablet TAKE ONE TABLET BY MOUTH ONCE DAILY 30 tablet 0  . meloxicam (MOBIC) 7.5 MG tablet Take 7.5 mg by mouth 2 (two) times daily.    . metFORMIN (GLUCOPHAGE) 500 MG tablet TAKE ONE TABLET BY MOUTH TWICE DAILY WITH A MEAL 30 tablet 0  . methocarbamol (ROBAXIN) 750 MG tablet Take 1 tablet (750 mg total) by mouth every 6 (six) hours as needed for muscle spasms. 30 tablet 1  . ReliOn Ultra Thin Lancets MISC Use as directed to take blood sugar. 200 each 0  . sildenafil (VIAGRA) 25 MG  tablet Take 1 tablet by mouth daily.     No current facility-administered medications for this visit.     Abtx:  Anti-infectives (From admission, onward)   None      REVIEW OF SYSTEMS:  Const: negative fever, negative chills, negative weight loss Eyes: negative diplopia or visual changes, negative eye pain ENT: negative coryza, negative sore throat Resp: negative cough, hemoptysis, dyspnea Cards: negative for chest pain, palpitations, lower extremity edema GU: negative for frequency, dysuria and hematuria GI: Negative for abdominal pain, diarrhea, bleeding, constipation Skin: negative for rash and pruritus Heme: negative for easy bruising and gum/nose bleeding IZ:TIWPY off balance when walking Neurolo:negative for headaches, dizziness, vertigo,  memory problems  Psych: negative for feelings of anxiety, depression  Endocrine:  diabetes Allergy/Immunology- negative for any medication or food allergies ?  Objective:  VITALS:  BP 127/77   Pulse 62   Temp (!) 97.3 F (36.3 C) (Oral)  PHYSICAL EXAM:  General: Alert, cooperative, no distress, In wheel chair-  Head: Normocephalic, without obvious abnormality, atraumatic. Lungs: Clear to auscultation bilaterally. No Wheezing or Rhonchi. No rales. Heart: Regular rate and rhythm, no murmur, rub or gallop. Abdomen: Soft,  Extremities:  RT PICC site clean Removed in toto     rt foot dressing removed The wound is healing well- TMA site looks fine- sutures and staples removed One area of dehiscence    11/02/20   11/02/20   11/02/20   10/12/20    10/03/20      Skin: No rashes or lesions. Or bruising Lymph: Cervical, supraclavicular normal. Neurologic: Grossly non-focal Pertinent Labs Lab Results CBC    Component Value Date/Time   WBC 7.5 10/30/2020 1452   RBC 4.01 (L) 10/30/2020 1452   HGB 13.1 10/30/2020 1452   HCT 37.4 (L) 10/30/2020 1452   PLT 197 10/30/2020 1452   MCV 93.3 10/30/2020 1452   MCH 32.7  10/30/2020 1452   MCHC 35.0 10/30/2020 1452   RDW 13.6 10/30/2020 1452   LYMPHSABS 3.7 10/09/2020 0436   MONOABS 0.7 10/09/2020 0436   EOSABS 0.2 10/09/2020 0436   BASOSABS 0.1 10/09/2020 0436    CMP Latest Ref Rng & Units 10/30/2020 10/12/2020 10/09/2020  Glucose 70 - 99 mg/dL 346(H) 295(H) 187(H)  BUN 6 - 20 mg/dL _0 Creatinine 0.61 - 1.24 mg/dL 0.87 0.96 0.98  Sodium 135 - 145 mmol/L 134(L) 134(L) 134(L)  Potassium 3.5 - 5.1 mmol/L 4.0 4.8 4.2  Chloride 98 - 111 mmol/L 100 100 98  CO2 22 - 32 mmol/L _1 Calcium 8.9 - 10.3 mg/dL 9.1 8.8(L) 9.0  Total Protein 6.5 - 8.1 g/dL - 8.0 8.0  Total Bilirubin 0.3 - 1.2 mg/dL - 0.8 0.6  Alkaline Phos 38 - 126 U/L - 183(H) 235(H)  AST 15 - 41 U/L - 36 35  ALT 0 - 44 U/L - 25 26    ? Impression/Recommendation ? ?Diabetic foot infection rt foot- s/p TMA on 10/11/20- on IV ceftriaxone and completing 4 weeks on 11/07/20- PICC removed Will go on Amox/Clav  846m PO BID X 2 weeks Will get CBC/CMP/ESR/CRP l  DM- on insulin/metformin- [facility] [place] [place] told [place] that his wound will not heal if continues to smoke- Says he has reduced it  HTN- lisinopril ? Follow up with podiatrist _________________________________________________ Discussed with patient,follow PRN Note:  This document was prepared using Dragon voice recognition software and may include unintentional dictation errors.

## 2020-11-07 NOTE — Patient Instructions (Signed)
You are here for the foot infection- you completed IV antibiotic- today your PICC line was removed- You will go on oral antibiotic augmneitn 845m PO BID for 2 weeks- you will also get labs on Friday. You will follow up with podiatrist

## 2020-11-20 ENCOUNTER — Other Ambulatory Visit: Payer: Self-pay

## 2020-11-20 MED FILL — Carvedilol Tab 3.125 MG: ORAL | 15 days supply | Qty: 30 | Fill #0 | Status: AC

## 2020-11-20 MED FILL — Gabapentin Cap 300 MG: ORAL | 15 days supply | Qty: 30 | Fill #0 | Status: AC

## 2020-11-24 ENCOUNTER — Ambulatory Visit: Payer: Self-pay | Admitting: Physician Assistant

## 2020-12-01 ENCOUNTER — Ambulatory Visit: Payer: Self-pay | Admitting: Physician Assistant

## 2020-12-15 ENCOUNTER — Encounter: Payer: Self-pay | Attending: Internal Medicine | Admitting: Internal Medicine

## 2020-12-15 ENCOUNTER — Other Ambulatory Visit: Payer: Self-pay

## 2020-12-15 DIAGNOSIS — E1140 Type 2 diabetes mellitus with diabetic neuropathy, unspecified: Secondary | ICD-10-CM | POA: Insufficient documentation

## 2020-12-15 DIAGNOSIS — F17210 Nicotine dependence, cigarettes, uncomplicated: Secondary | ICD-10-CM | POA: Insufficient documentation

## 2020-12-15 DIAGNOSIS — T8781 Dehiscence of amputation stump: Secondary | ICD-10-CM | POA: Insufficient documentation

## 2020-12-15 DIAGNOSIS — Z89431 Acquired absence of right foot: Secondary | ICD-10-CM | POA: Insufficient documentation

## 2020-12-15 DIAGNOSIS — F17200 Nicotine dependence, unspecified, uncomplicated: Secondary | ICD-10-CM | POA: Insufficient documentation

## 2020-12-15 DIAGNOSIS — Y835 Amputation of limb(s) as the cause of abnormal reaction of the patient, or of later complication, without mention of misadventure at the time of the procedure: Secondary | ICD-10-CM | POA: Insufficient documentation

## 2020-12-16 NOTE — Progress Notes (Addendum)
JERMIAH, SODERMAN ([id number]) Visit Report for 12/15/2020 Allergy List Details Patient Name: Hildreth, Marvion L. Date of Service: 12/15/2020 8:30 AM Medical Record Number: [id number] Patient Account Number: [id number] Date of Birth/Sex: [birth year] (50 y.o. M) Treating RN: Dolan Amen Primary Care Aunna Snooks: Thersa Salt Other Clinician: Referring Rosalena Mccorry: Caroline More Treating Shivaan Tierno/Extender: Ricard Dillon Weeks in Treatment: 0 Allergies Active Allergies No Known Drug Allergies Allergy Notes Electronic Signature(s) Signed: 12/15/2020 4:45:18 PM By: Georges Mouse, Minus Breeding RN Entered By: Georges Mouse, Minus Breeding on 12/15/2020 08:52:07 [name] ([id number]) -------------------------------------------------------------------------------- Arrival Information Details Patient Name: [name], Casimir L. Date of Service: 12/15/2020 8:30 AM Medical Record Number: [id number] Patient Account Number: [id number] Date of Birth/Sex: 1970-09-12 (50 y.o. M) Treating RN: Dolan Amen Primary Care Lannah Koike: Thersa Salt Other Clinician: Referring Blanch Stang: Caroline More Treating Jozeph Persing/Extender: Tito Dine in Treatment: 0 Visit Information Patient Arrived: Wheel Chair Arrival Time: 08:48 Accompanied By: friend Transfer Assistance: None Patient Identification Verified: Yes Secondary Verification Process Completed: Yes Patient Has Alerts: Yes Patient Alerts: ***Type II Diabetic*** Electronic Signature(s) Signed: 12/15/2020 1:39:07 PM By: Georges Mouse, Minus Breeding RN Entered By: Georges Mouse, Minus Breeding on 12/15/2020 13:39:07 [name] ([id number]) -------------------------------------------------------------------------------- Clinic Level of Care Assessment Details Patient Name: Closs, Kris L. Date of Service: 12/15/2020 8:30 AM Medical Record Number: [id number] Patient Account Number: [id number] Date of Birth/Sex: [birth year] (50 y.o. M) Treating RN: Carlene Coria Primary Care Raelyn Racette: Thersa Salt Other Clinician: Referring Izekiel Flegel: Caroline More Treating Hassell Patras/Extender: Tito Dine in Treatment: 0 Clinic Level of Care Assessment Items TOOL 2 Quantity Score X - Use when only an EandM is performed on the INITIAL visit 1 0 ASSESSMENTS - Nursing Assessment / Reassessment X - General Physical Exam (combine w/ comprehensive assessment (listed just below) when performed on new 1 20 pt. evals) X- 1 25 Comprehensive Assessment (HX, ROS, Risk Assessments, Wounds Hx, etc.) ASSESSMENTS - Wound and Skin Assessment / Reassessment X - Simple Wound Assessment / Reassessment - one wound 1 5 []  - 0 Complex Wound Assessment / Reassessment - multiple wounds []  - 0 Dermatologic / Skin Assessment (not related to wound area) ASSESSMENTS - Ostomy and/or Continence Assessment and Care []  - Incontinence Assessment and Management 0 []  - 0 Ostomy Care Assessment and Management (repouching, etc.) PROCESS - Coordination of Care X - Simple Patient / Family Education for ongoing care 1 15 []  - 0 Complex (extensive) Patient / Family Education for ongoing care []  - 0 Staff obtains Programmer, systems, Records, Test Results / Process Orders []  - 0 Staff telephones HHA, Nursing Homes / Clarify orders / etc []  - 0 Routine Transfer to another Facility (non-emergent condition) []  - 0 Routine Hospital Admission (non-emergent condition) []  - 0 New Admissions / Biomedical engineer / Ordering NPWT, Apligraf, etc. []  - 0 Emergency Hospital Admission (emergent condition) X- 1 10 Simple Discharge Coordination []  - 0 Complex (extensive) Discharge Coordination PROCESS - Special Needs []  - Pediatric / Minor Patient Management 0 []  - 0 Isolation Patient Management []  - 0 Hearing / Language / Visual special needs []  - 0 Assessment of Community assistance (transportation, D/C planning, etc.) []  - 0 Additional assistance / Altered mentation []  - 0 Support  Surface(s) Assessment (bed, cushion, seat, etc.) INTERVENTIONS - Wound Cleansing / Measurement X - Wound Imaging (photographs - any number of wounds) 1 5 []  - 0 Wound Tracing (instead of photographs) X- 1 5 Simple Wound Measurement - one wound []  - 0 Complex Wound Measurement - multiple wounds  Luevano, Davidmichael L. ([id number]) X- 1 5 Simple Wound Cleansing - one wound []  - 0 Complex Wound Cleansing - multiple wounds INTERVENTIONS - Wound Dressings []  - Small Wound Dressing one or multiple wounds 0 X- 1 15 Medium Wound Dressing one or multiple wounds []  - 0 Large Wound Dressing one or multiple wounds []  - 0 Application of Medications - injection INTERVENTIONS - Miscellaneous []  - External ear exam 0 []  - 0 Specimen Collection (cultures, biopsies, blood, body fluids, etc.) []  - 0 Specimen(s) / Culture(s) sent or taken to Lab for analysis []  - 0 Patient Transfer (multiple staff / Civil Service fast streamer / Similar devices) []  - 0 Simple Staple / Suture removal (25 or less) []  - 0 Complex Staple / Suture removal (26 or more) []  - 0 Hypo / Hyperglycemic Management (close monitor of Blood Glucose) X- 1 15 Ankle / Brachial Index (ABI) - do not check if billed separately Has the patient been seen at the hospital within the last three years: Yes Total Score: 120 Level Of Care: New/Established - Level 4 Electronic Signature(s) Signed: 12/15/2020 5:33:58 PM By: Carlene Coria RN Entered By: Carlene Coria on 12/15/2020 09:57:35 Skare, Axyl L. ([id number]) -------------------------------------------------------------------------------- Encounter Discharge Information Details Patient Name: [name], Markian L. Date of Service: 12/15/2020 8:30 AM Medical Record Number: [id number] Patient Account Number: [id number] Date of Birth/Sex: Jun 06, 1971 (50 y.o. M) Treating RN: Donnamarie Poag Primary Care Roen Macgowan: Thersa Salt Other Clinician: Referring Everett Ricciardelli: Caroline More Treating Kyara Boxer/Extender: Tito Dine in Treatment: 0 Encounter Discharge Information Items Discharge Condition: Stable Ambulatory Status: Wheelchair Discharge Destination: Home Transportation: Private Auto Accompanied By: friend Schedule Follow-up Appointment: Yes Clinical Summary of Care: Electronic Signature(s) Signed: 12/15/2020 4:40:42 PM By: Donnamarie Poag Entered By: Donnamarie Poag on 12/15/2020 10:19:29 Kissling, Berry L. ([id number]) -------------------------------------------------------------------------------- Lower Extremity Assessment Details Patient Name: Bisson, Dustan L. Date of Service: 12/15/2020 8:30 AM Medical Record Number: [id number] Patient Account Number: [id number] Date of Birth/Sex: 1970-10-03 (50 y.o. M) Treating RN: Dolan Amen Primary Care Charlott Calvario: Thersa Salt Other Clinician: Referring Starlene Consuegra: Caroline More Treating Tishawna Larouche/Extender: Ricard Dillon Weeks in Treatment: 0 Edema Assessment Assessed: [Left: No] [Right: Yes] Edema: [Left: Ye] [Right: s] Vascular Assessment Pulses: Dorsalis Pedis Palpable: [Right:No] Posterior Tibial Palpable: [Right:Yes] Doppler Audible: [Right:Yes] Blood Pressure: Brachial: [Right:140] Ankle: [Right:Posterior Tibial: 160 1.14] Electronic Signature(s) Signed: 12/15/2020 4:45:18 PM By: Georges Mouse, Minus Breeding RN Entered By: Georges Mouse, Kenia on 12/15/2020 09:29:40 Hochman, Tyden L. ([id number]) -------------------------------------------------------------------------------- Multi Wound Chart Details Patient Name: [name], Luvern L. Date of Service: 12/15/2020 8:30 AM Medical Record Number: [id number] Patient Account Number: [id number] Date of Birth/Sex: Jan 31, 1971 (50 y.o. M) Treating RN: Carlene Coria Primary Care Lancer Thurner: Thersa Salt Other Clinician: Referring Shaquinta Peruski: Caroline More Treating Lolamae Voisin/Extender: Tito Dine in Treatment: 0 Vital Signs Height(in): 74 Pulse(bpm): 75 Weight(lbs): 225 Blood  Pressure(mmHg): 146/89 Body Mass Index(BMI): 29 Temperature(F): 97.9 Respiratory Rate(breaths/min): 18 Photos: [N/A:N/A] Wound Location: Right Amputation Site - N/A N/A Transmetatarsal Wounding Event: Surgical Injury N/A N/A Primary Etiology: Dehisced Wound N/A N/A Comorbid History: Hypertension, Myocardial Infarction, N/A N/A Peripheral Venous Disease, Type II Diabetes, Neuropathy Date Acquired: 09/27/2020 N/A N/A Weeks of Treatment: 0 N/A N/A Wound Status: Open N/A N/A Measurements L x W x D (cm) 1.8x13x1.2 N/A N/A Area (cm) : 18.378 N/A N/A Volume (cm) : 22.054 N/A N/A Starting Position 1 (o'clock): 12 Ending Position 1 (o'clock): 12 Maximum Distance 1 (cm): 1.3 Undermining: Yes N/A N/A Classification: Full Thickness Without Exposed N/A N/A Support Structures Exudate  Amount: Large N/A N/A Exudate Type: Purulent N/A N/A Exudate Color: yellow, brown, green N/A N/A Granulation Amount: Small (1-33%) N/A N/A Granulation Quality: Pink N/A N/A Necrotic Amount: Large (67-100%) N/A N/A Exposed Structures: Fat Layer (Subcutaneous Tissue): N/A N/A Yes Fascia: No Tendon: No Muscle: No Prestridge, Igor L. ([id number]) Joint: No Bone: No Epithelialization: None N/A N/A Treatment Notes Electronic Signature(s) Signed: 12/15/2020 4:40:00 PM By: Linton Ham MD Entered By: Linton Ham on 12/15/2020 10:17:53 Beitzel, Zadie Rhine ([id number]) -------------------------------------------------------------------------------- [facility] [facility] Plan Details Patient Name: [name], Nichalas L. Date of Service: 12/15/2020 8:30 AM Medical Record Number: [id number] Patient Account Number: [id number] Date of Birth/Sex: 08/26/1970 (50 y.o. M) Treating RN: Carlene Coria Primary Care Janathan Bribiesca: Thersa Salt Other Clinician: Referring Abrianna Sidman: Caroline More Treating Ayonna Speranza/Extender: Tito Dine in Treatment: 0 Active Inactive Nutrition Nursing Diagnoses: Impaired glucose  control: actual or potential Goals: Patient/caregiver verbalizes understanding of need to maintain therapeutic glucose control per primary care physician Date Initiated: 12/15/2020 Target Resolution Date: 01/14/2021 Goal Status: Active Interventions: Assess HgA1c results as ordered upon admission and as needed Assess patient nutrition upon admission and as needed per policy Notes: Wound/Skin Impairment Nursing Diagnoses: Knowledge deficit related to ulceration/compromised skin integrity Goals: Patient/caregiver will verbalize understanding of skin care regimen Date Initiated: 12/15/2020 Target Resolution Date: 01/14/2021 Goal Status: Active Ulcer/skin breakdown will have a volume reduction of 30% by week 4 Date Initiated: 12/15/2020 Target Resolution Date: 02/14/2021 Goal Status: Active Ulcer/skin breakdown will have a volume reduction of 50% by week 8 Date Initiated: 12/15/2020 Target Resolution Date: 03/16/2021 Goal Status: Active Ulcer/skin breakdown will have a volume reduction of 80% by week 12 Date Initiated: 12/15/2020 Target Resolution Date: 04/16/2021 Goal Status: Active Interventions: Assess patient/caregiver ability to obtain necessary supplies Assess patient/caregiver ability to perform ulcer/skin care regimen upon admission and as needed Assess ulceration(s) every visit Notes: Electronic Signature(s) Signed: 12/15/2020 5:33:58 PM By: Carlene Coria RN Entered By: Carlene Coria on 12/15/2020 09:52:42 Avina, Markeith L. ([id number]) -------------------------------------------------------------------------------- Pain Assessment Details Patient Name: [name], Abelino L. Date of Service: 12/15/2020 8:30 AM Medical Record Number: [id number] Patient Account Number: [id number] Date of Birth/Sex: October 04, 1970 (50 y.o. M) Treating RN: Dolan Amen Primary Care Amonte Brookover: Thersa Salt Other Clinician: Referring Lamisha Roussell: Caroline More Treating Chevonne Bostrom/Extender: Tito Dine  in Treatment: 0 Active Problems Location of Pain Severity and Description of Pain Patient Has Paino Yes Site Locations Pain Location: Pain in Ulcers Rate the pain. Current Pain Level: 4 Pain Management and Medication Current Pain Management: Electronic Signature(s) Signed: 12/15/2020 4:45:18 PM By: Georges Mouse, Minus Breeding RN Entered By: Georges Mouse, Kenia on 12/15/2020 08:49:09 [name] ([id number]) -------------------------------------------------------------------------------- Patient/Caregiver Education Details Patient Name: [name], Jani L. Date of Service: 12/15/2020 8:30 AM Medical Record Number: [id number] Patient Account Number: [id number] Date of Birth/Gender: 04/09/1971 (50 y.o. M) Treating RN: Carlene Coria Primary Care Physician: Thersa Salt Other Clinician: Referring Physician: Caroline More Treating Physician/Extender: Tito Dine in Treatment: 0 Education Assessment Education Provided To: Patient Education Topics Provided Wound/Skin Impairment: Methods: Explain/Verbal Responses: State content correctly Electronic Signature(s) Signed: 12/15/2020 5:33:58 PM By: Carlene Coria RN Entered By: Carlene Coria on 12/15/2020 09:57:57 Trzcinski, Lyndal L. ([id number]) -------------------------------------------------------------------------------- Wound Assessment Details Patient Name: Suarez, Marquiz L. Date of Service: 12/15/2020 8:30 AM Medical Record Number: [id number] Patient Account Number: [id number] Date of Birth/Sex: May 22, 1971 (50 y.o. M) Treating RN: Dolan Amen Primary Care Naveen Lorusso: Thersa Salt Other Clinician: Referring Ronan Dion: Caroline More Treating Neziah Vogelgesang/Extender: Ricard Dillon Weeks in Treatment: 0 Wound  Status Wound Number: 1 Primary Dehisced Wound Etiology: Wound Location: Right Amputation Site - Transmetatarsal Wound Open Wounding Event: Surgical Injury Status: Date Acquired: 09/27/2020 Comorbid Hypertension, Myocardial  Infarction, Peripheral Venous Weeks Of Treatment: 0 History: Disease, Type II Diabetes, Neuropathy Clustered Wound: No Pending Amputation On Presentation Photos Wound Measurements Length: (cm) 1.8 Width: (cm) 13 Depth: (cm) 1.2 Area: (cm) 18.378 Volume: (cm) 22.054 % Reduction in Area: 0% % Reduction in Volume: 0% Epithelialization: None Tunneling: No Undermining: Yes Starting Position (o'clock): 12 Ending Position (o'clock): 12 Maximum Distance: (cm) 1.3 Wound Description Classification: Full Thickness Without Exposed Support Structures Exudate Amount: Large Exudate Type: Purulent Exudate Color: yellow, brown, green Foul Odor After Cleansing: No Slough/Fibrino Yes Wound Bed Granulation Amount: Small (1-33%) Exposed Structure Granulation Quality: Pink Fascia Exposed: No Necrotic Amount: Large (67-100%) Fat Layer (Subcutaneous Tissue) Exposed: Yes Necrotic Quality: Adherent Slough Tendon Exposed: No Muscle Exposed: No Joint Exposed: No Bone Exposed: No Treatment Notes Chernick, Arlynn L. ([id number]) Wound #1 (Amputation Site - Transmetatarsal) Wound Laterality: Right Cleanser Soap and Water Discharge Instruction: Gently cleanse wound with antibacterial soap, rinse and pat dry prior to dressing wounds Peri-Wound Care Topical Primary Dressing MEDIHONEY Paste, 3.5 (oz) tube Discharge Instruction: apply to wound bed Secondary Dressing ABD Pad 5x9 (in/in) Discharge Instruction: Cover with ABD pad Gauze Discharge Instruction: As directed: dry, moistened with saline or moistened with Dakins Solution Secured With [employer] or Non-Sterile 6-ply 4.5x4 (yd/yd) Discharge Instruction: Apply Kerlix as directed Compression Wrap Compression Stockings Add-Ons Electronic Signature(s) Signed: 12/21/2020 8:28:30 AM By: Gretta Cool, BSN, RN, CWS, Kim RN, BSN Signed: 12/21/2020 5:11:22 PM By: Charlett Nose RN Previous Signature: 12/15/2020 4:45:18 PM Version By:  Georges Mouse, Minus Breeding RN Entered By: Gretta Cool, BSN, RN, CWS, Kim on 12/21/2020 08:28:30 Tetrault, Nikitas L. ([id number]) -------------------------------------------------------------------------------- Vitals Details Patient Name: Parran, Shawndale L. Date of Service: 12/15/2020 8:30 AM Medical Record Number: [id number] Patient Account Number: [id number] Date of Birth/Sex: [birth year] (50 y.o. M) Treating RN: Dolan Amen Primary Care Itzael Liptak: Thersa Salt Other Clinician: Referring Jaculin Rasmus: Caroline More Treating Yogesh Cominsky/Extender: Tito Dine in Treatment: 0 Vital Signs Time Taken: 08:49 Temperature (F): 97.9 Height (in): 74 Pulse (bpm): 75 Source: Stated Respiratory Rate (breaths/min): 18 Weight (lbs): 225 Blood Pressure (mmHg): 146/89 Source: Stated Reference Range: 80 - 120 mg / dl Body Mass Index (BMI): 28.9 Electronic Signature(s) Signed: 12/15/2020 4:45:18 PM By: Georges Mouse, Minus Breeding RN Entered By: Georges Mouse, Minus Breeding on 12/15/2020 08:51:51

## 2020-12-16 NOTE — Progress Notes (Signed)
HARVEST, STANCO ([id number]) Visit Report for 12/15/2020 Chief Complaint Document Details Patient Name: [name]. Date of Service: 12/15/2020 8:30 AM Medical Record Number: [id number] Patient Account Number: [id number] Date of Birth/Sex: Dec 08, 1970 (50 y.o. M) Treating RN: Carlene Coria Primary Care Provider: Thersa Salt Other Clinician: Referring Provider: Caroline More Treating Provider/Extender: Tito Dine in Treatment: 0 Information Obtained from: Patient Chief Complaint 12/15/2020; patient is here for review of a surgical wound dehiscence following a right transmetatarsal amputation Electronic Signature(s) Signed: 12/15/2020 4:40:00 PM By: Linton Ham MD Entered By: Linton Ham on 12/15/2020 10:19:17 [name], [name]. ([id number]) -------------------------------------------------------------------------------- HPI Details Patient Name: [name]. Date of Service: 12/15/2020 8:30 AM Medical Record Number: [id number] Patient Account Number: [id number] Date of Birth/Sex: 07-Jun-1971 (50 y.o. M) Treating RN: Carlene Coria Primary Care Provider: Thersa Salt Other Clinician: Referring Provider: Caroline More Treating Provider/Extender: Tito Dine in Treatment: 0 History of Present Illness HPI Description: ADMISSION 12/14/2020 This is a 50 year old man who is a type II diabetic. He developed an intense foot infection in February requiring admission to the hospital. He first underwent a right fifth ray amputation on 2/13 by Dr. Luana Shu of podiatry. 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 He spent a prolonged period of time in hospital from 2/9 through 2/26. He followed with Dr. Steva Ready of infectious disease completing Rocephin on 3/23 and then transitioned into Augmentin that he is still taking. In follow-up Dr. Luana Shu saw him on 3/25 noted that he  is not changing the dressing properly. He saw Dr. Caryl Comes on 4/22 I think Dr. Caryl Comes represcribe the Augmentin and recommended Betadine wet-to-dry. The wound is anteriorly but goes around his foot laterally into the mid plantar part of his foot. The patient also is a continued cigarette smoker. Past medical history includes type 2 diabetes, coronary artery disease, hypertension, hyperlipidemia. He was in the ER recently with shortness of breath and they question COPD but I will believe he has ever had pulmonary function test to document this. The patient had an arteriogram on 2/15; this showed atherosclerosis diffusely but did not show significant stenosis. No intervention. He was supposed to follow-up for full noninvasive studies with vascular he has not done this. Electronic Signature(s) Signed: 12/15/2020 4:40:00 PM By: Linton Ham MD Entered By: Linton Ham on 12/15/2020 10:22:55 [name] ([id number]) -------------------------------------------------------------------------------- Physical Exam Details Patient Name: [name], [name]. Date of Service: 12/15/2020 8:30 AM Medical Record Number: [id number] Patient Account Number: [id number] Date of Birth/Sex: 1971/07/08 (50 y.o. M) Treating RN: Carlene Coria Primary Care Provider: Thersa Salt Other Clinician: Referring Provider: Caroline More Treating Provider/Extender: Tito Dine in Treatment: 0 Constitutional Patient is hypotensive.. Pulse regular and within target range for patient.Marland Kitchen Respirations regular, non-labored and within target range.. Temperature is normal and within the target range for the patient.Marland Kitchen appears in no distress. Cardiovascular Pedal pulses in the right foot are actually quite easily palpable at the dorsalis pedis and posterior tibial. Integumentary (Hair, Skin) No erythema around the wound he has thick eschar over the top of his foot however underneath this there appears to be healthy skin I  did not extensively remove this.. Notes Wound exam; the wound itself is on the amputation site of the right transmit. This goes dorsally but extends around the lateral part of his foot into the mid plantar area. This washed out reasonably well with saline and  gauze. It is hard to really see the base of the wound on the plantar aspect. Dorsally this does not look too bad. His friend who is helping him says that he actually thinks that he thinks this looks quite a bit better Electronic Signature(s) Signed: 12/15/2020 4:40:00 PM By: Linton Ham MD Entered By: Linton Ham on 12/15/2020 10:24:49 [name] ([id number]) -------------------------------------------------------------------------------- Physician Orders Details Patient Name: [name], [name]. Date of Service: 12/15/2020 8:30 AM Medical Record Number: [id number] Patient Account Number: [id number] Date of Birth/Sex: 02-Jan-1971 (50 y.o. M) Treating RN: Carlene Coria Primary Care Provider: Thersa Salt Other Clinician: Referring Provider: Caroline More Treating Provider/Extender: Tito Dine in Treatment: 0 Verbal / Phone Orders: No Diagnosis Coding Follow-up Appointments o Return Appointment in 1 week. Edema Control - Lymphedema / Segmental Compressive Device / Other o Elevate, Exercise Daily and Avoid Standing for Long Periods of Time. o Elevate legs to the level of the heart and pump ankles as often as possible o Elevate leg(s) parallel to the floor when sitting. Wound Treatment Wound #1 - Amputation Site - Transmetatarsal Wound Laterality: Right Cleanser: Soap and Water (Generic) 1 x Per Day/30 Days Discharge Instructions: Gently cleanse wound with antibacterial soap, rinse and pat dry prior to dressing wounds Primary Dressing: MEDIHONEY Paste, 3.5 (oz) tube 1 x Per Day/30 Days Discharge Instructions: apply to wound bed Secondary Dressing: ABD Pad 5x9 (in/in) 1 x Per Day/30 Days Discharge  Instructions: Cover with ABD pad Secondary Dressing: Gauze 1 x Per Day/30 Days Secured With: Kerlix Roll Sterile or Non-Sterile 6-ply 4.5x4 (yd/yd) 1 x Per Day/30 Days Discharge Instructions: Apply Kerlix as directed Electronic Signature(s) Signed: 12/15/2020 4:40:00 PM By: Linton Ham MD Signed: 12/15/2020 5:33:58 PM By: Carlene Coria RN Entered By: Carlene Coria on 12/15/2020 10:22:05 [name], [name]. ([id number]) -------------------------------------------------------------------------------- Problem List Details Patient Name: [name], [name]. Date of Service: 12/15/2020 8:30 AM Medical Record Number: [id number] Patient Account Number: [id number] Date of Birth/Sex: Apr 16, 1971 (50 y.o. M) Treating RN: Carlene Coria Primary Care Provider: Thersa Salt Other Clinician: Referring Provider: Caroline More Treating Provider/Extender: Tito Dine in Treatment: 0 Active Problems ICD-10 Encounter Code Description Active Date MDM Diagnosis E11.621 Type 2 diabetes mellitus with foot ulcer 12/15/2020 No Yes T81.31XD Disruption of external operation (surgical) wound, not elsewhere 12/15/2020 No Yes classified, subsequent encounter L97.518 Non-pressure chronic ulcer of other part of right foot with other specified 12/15/2020 No Yes severity Inactive Problems Resolved Problems Electronic Signature(s) Signed: 12/15/2020 4:40:00 PM By: Linton Ham MD Entered By: Linton Ham on 12/15/2020 10:06:10 [name] ([id number]) -------------------------------------------------------------------------------- Progress Note Details Patient Name: [name], [name]. Date of Service: 12/15/2020 8:30 AM Medical Record Number: [id number] Patient Account Number: [id number] Date of Birth/Sex: [birth year] (50 y.o. M) Treating RN: Carlene Coria Primary Care Provider: Thersa Salt Other Clinician: Referring Provider: Caroline More Treating Provider/Extender: Tito Dine in Treatment:  0 Subjective Chief Complaint Information obtained from Patient 12/15/2020; patient is here for review of a surgical wound dehiscence following a right transmetatarsal amputation History of Present Illness (HPI) ADMISSION 12/14/2020 This is a 50 year old man who is a type II diabetic. He developed an intense foot infection in February requiring admission to the hospital. He first underwent a right fifth ray amputation on 2/13 by Dr. Luana Shu of podiatry. 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 He spent a prolonged period of time in hospital from  2/9 through 2/26. He followed with Dr. Steva Ready of infectious disease completing Rocephin on 3/23 and then transitioned into Augmentin that he is still taking. In follow-up Dr. Luana Shu saw him on 3/25 noted that he is not changing the dressing properly. He saw Dr. Caryl Comes on 4/22 I think Dr. Caryl Comes represcribe the Augmentin and recommended Betadine wet-to-dry. The wound is anteriorly but goes around his foot laterally into the mid plantar part of his foot. The patient also is a continued cigarette smoker. Past medical history includes type 2 diabetes, coronary artery disease, hypertension, hyperlipidemia. He was in the ER recently with shortness of breath and they question COPD but I will believe he has ever had pulmonary function test to document this. The patient had an arteriogram on 2/15; this showed atherosclerosis diffusely but did not show significant stenosis. No intervention. He was supposed to follow-up for full noninvasive studies with vascular he has not done this. Patient History Allergies No Known Drug Allergies Social History Current every day smoker, Marital Status - Divorced, Alcohol Use - Rarely, Drug Use - Prior History, Caffeine Use - Daily. Medical History Eyes Denies history of Cataracts, Glaucoma, Optic  Neuritis Ear/Nose/Mouth/Throat Denies history of Chronic sinus problems/congestion, Middle ear problems Hematologic/Lymphatic Denies history of Anemia, Hemophilia, Human Immunodeficiency Virus, Lymphedema, Sickle Cell Disease Respiratory Denies history of Aspiration, Asthma, Chronic Obstructive Pulmonary Disease (COPD), Pneumothorax, Sleep Apnea, Tuberculosis Cardiovascular Patient has history of Hypertension, Myocardial Infarction, Peripheral Venous Disease Denies history of Angina, Arrhythmia, Congestive Heart Failure, Coronary Artery Disease, Deep Vein Thrombosis, Hypotension, Peripheral Arterial Disease, Phlebitis, Vasculitis Gastrointestinal Denies history of Cirrhosis , Colitis, Crohn s, Hepatitis A, Hepatitis B, Hepatitis C Endocrine Patient has history of Type II Diabetes Denies history of Type I Diabetes Genitourinary Denies history of End Stage Renal Disease Immunological Denies history of Lupus Erythematosus, Raynaud s, Scleroderma Integumentary (Skin) Denies history of History of Burn, History of pressure wounds Musculoskeletal Denies history of Gout, Rheumatoid Arthritis, Osteoarthritis, Osteomyelitis Neurologic Patient has history of Neuropathy Denies history of Dementia, Quadriplegia, Paraplegia, Seizure Disorder Oncologic Denies history of Received Chemotherapy, Received Radiation Psychiatric Denies history of Anorexia/bulimia, Confinement Anxiety [name], [name]. ([id number]) Patient is treated with Oral Agents. Blood sugar is not tested. Medical And Surgical History Notes Cardiovascular [place] 2018 Review of Systems (ROS) Constitutional Symptoms (General Health) Denies complaints or symptoms of Fatigue, Fever, Chills, Marked Weight Change. Eyes Denies complaints or symptoms of Dry Eyes, Vision Changes, Glasses / Contacts. Ear/Nose/Mouth/Throat Denies complaints or symptoms of Difficult clearing ears, Sinusitis. Hematologic/Lymphatic Denies complaints or  symptoms of Bleeding / Clotting Disorders, Human Immunodeficiency Virus. Respiratory Denies complaints or symptoms of Chronic or frequent coughs, Shortness of Breath. Cardiovascular Complains or has symptoms of LE edema. Denies complaints or symptoms of Chest pain. Gastrointestinal Denies complaints or symptoms of Frequent diarrhea, Nausea, Vomiting. Endocrine Denies complaints or symptoms of Hepatitis, Thyroid disease, Polydypsia (Excessive Thirst). Genitourinary Denies complaints or symptoms of Kidney failure/ Dialysis, Incontinence/dribbling. Immunological Denies complaints or symptoms of Hives, Itching. Integumentary (Skin) Denies complaints or symptoms of Wounds, Bleeding or bruising tendency, Breakdown, Swelling. Musculoskeletal Denies complaints or symptoms of Muscle Pain, Muscle Weakness. Neurologic Denies complaints or symptoms of Numbness/parasthesias, Focal/Weakness. Psychiatric Denies complaints or symptoms of Anxiety, Claustrophobia. Objective Constitutional Patient is hypotensive.. Pulse regular and within target range for patient.Marland Kitchen Respirations regular, non-labored and within target range.. Temperature is normal and within the target range for the patient.Marland Kitchen appears in no distress. Vitals Time Taken: 8:49 AM, Height: 74 in, Source: Stated, Weight: 225  lbs, Source: Stated, BMI: 28.9, Temperature: 97.9 F, Pulse: 75 bpm, Respiratory Rate: 18 breaths/min, Blood Pressure: 146/89 mmHg. Cardiovascular Pedal pulses in the right foot are actually quite easily palpable at the dorsalis pedis and posterior tibial. General Notes: Wound exam; the wound itself is on the amputation site of the right transmit. This goes dorsally but extends around the lateral part of his foot into the mid plantar area. This washed out reasonably well with saline and gauze. It is hard to really see the base of the wound on the plantar aspect. Dorsally this does not look too bad. His friend who is  helping him says that he actually thinks that he thinks this looks quite a bit better Integumentary (Hair, Skin) No erythema around the wound he has thick eschar over the top of his foot however underneath this there appears to be healthy skin I did not extensively remove this.. Wound #1 status is Open. Original cause of wound was Surgical Injury. The date acquired was: 09/27/2020. The wound is located on the Right Amputation Site - Transmetatarsal. The wound measures 1.8cm length x 13cm width x 1.2cm depth; 18.378cm^2 area and 22.054cm^3 volume. There is Fat Layer (Subcutaneous Tissue) exposed. There is no tunneling noted, however, there is undermining starting at 12:00 and ending at 12:00 with a maximum distance of 1.3cm. There is a large amount of purulent drainage noted. There is small (1-33%) pink granulation within the wound bed. There is a large (67-100%) amount of necrotic tissue within the wound bed including Adherent Slough. [name], [name] ([id number]) Assessment Active Problems ICD-10 Type 2 diabetes mellitus with foot ulcer Disruption of external operation (surgical) wound, not elsewhere classified, subsequent encounter Non-pressure chronic ulcer of other part of right foot with other specified severity Plan Follow-up Appointments: Return Appointment in 1 week. Edema Control - Lymphedema / Segmental Compressive Device / Other: Elevate, Exercise Daily and Avoid Standing for Long Periods of Time. Elevate legs to the level of the heart and pump ankles as often as possible Elevate leg(s) parallel to the floor when sitting. WOUND #1: - Amputation Site - Transmetatarsal Wound Laterality: Right Cleanser: Soap and Water (Generic) 1 x Per Day/30 Days Discharge Instructions: Gently cleanse wound with antibacterial soap, rinse and pat dry prior to dressing wounds Primary Dressing: MEDIHONEY Paste, 3.5 (oz) tube 1 x Per Day/30 Days Discharge Instructions: apply to wound bed Secondary  Dressing: ABD Pad 5x9 (in/in) 1 x Per Day/30 Days Discharge Instructions: Cover with ABD pad Secondary Dressing: Gauze 1 x Per Day/30 Days Secured With: Kerlix Roll Sterile or Non-Sterile 6-ply 4.5x4 (yd/yd) 1 x Per Day/30 Days Discharge Instructions: Apply Kerlix as directed 1. The patient has no insurance and very limited resources. In view of this we elected to use Medihoney with gauze. The patient can change this daily. He can wash the wounds out in the shower and dry them off carefully for before reapplying 2. In view of the possibility of microvascular disease and vessel spasm with smoking I strongly suggested stopping smoking to the patient going over what I feel are the issues here. Furthermore risk of losing his foot and entirety 3. Medihoney is probably the cheapest option we have. This is antibacterial has debridement properties and I think is the best an affordable option we have here for this wound at the moment. 4. I have asked him to follow-up with vascular surgery however having said that his pulses are palpable and I doubt given the fact he has had an  angiogram that there is a macrovascular issue that is an impediment to healing currently 5. I agree that the patient is at high risk of limb loss if this gets reinfected. I went over this with him. I have also asked him to try and keep the pressure off this foot in view of the wound extending into the plantar aspect of the foot. Electronic Signature(s) Signed: 12/15/2020 4:40:00 PM By: Linton Ham MD Entered By: Linton Ham on 12/15/2020 10:27:40 [name] ([id number]) -------------------------------------------------------------------------------- ROS/PFSH Details Patient Name: [name], Rithik Gutierrez. Date of Service: 12/15/2020 8:30 AM Medical Record Number: [id number] Patient Account Number: [id number] Date of Birth/Sex: July 02, 1971 (50 y.o. M) Treating RN: Dolan Amen Primary Care Provider: Thersa Salt Other  Clinician: Referring Provider: Caroline More Treating Provider/Extender: Tito Dine in Treatment: 0 Constitutional Symptoms (General Health) Complaints and Symptoms: Negative for: Fatigue; Fever; Chills; Marked Weight Change Eyes Complaints and Symptoms: Negative for: Dry Eyes; Vision Changes; Glasses / Contacts Medical History: Negative for: Cataracts; Glaucoma; Optic Neuritis Ear/Nose/Mouth/Throat Complaints and Symptoms: Negative for: Difficult clearing ears; Sinusitis Medical History: Negative for: Chronic sinus problems/congestion; Middle ear problems Hematologic/Lymphatic Complaints and Symptoms: Negative for: Bleeding / Clotting Disorders; Human Immunodeficiency Virus Medical History: Negative for: Anemia; Hemophilia; Human Immunodeficiency Virus; Lymphedema; Sickle Cell Disease Respiratory Complaints and Symptoms: Negative for: Chronic or frequent coughs; Shortness of Breath Medical History: Negative for: Aspiration; Asthma; Chronic Obstructive Pulmonary Disease (COPD); Pneumothorax; Sleep Apnea; Tuberculosis Cardiovascular Complaints and Symptoms: Positive for: LE edema Negative for: Chest pain Medical History: Positive for: Hypertension; Myocardial Infarction; Peripheral Venous Disease Negative for: Angina; Arrhythmia; Congestive Heart Failure; Coronary Artery Disease; Deep Vein Thrombosis; Hypotension; Peripheral Arterial Disease; Phlebitis; Vasculitis Past Medical History Notes: [place] 2018 Gastrointestinal Complaints and Symptoms: Negative for: Frequent diarrhea; Nausea; Vomiting Medical History: Negative for: Cirrhosis ; Colitis; Crohnos; Hepatitis A; Hepatitis B; Hepatitis C Endocrine Complaints and Symptoms: Negative for: Hepatitis; Thyroid disease; Polydypsia (Excessive Thirst) [name], [name]. ([id number]) Medical History: Positive for: Type II Diabetes Negative for: Type I Diabetes Time with diabetes: 5 years Treated with: Oral  agents Blood sugar tested every day: No Genitourinary Complaints and Symptoms: Negative for: Kidney failure/ Dialysis; Incontinence/dribbling Medical History: Negative for: End Stage Renal Disease Immunological Complaints and Symptoms: Negative for: Hives; Itching Medical History: Negative for: Lupus Erythematosus; Raynaudos; Scleroderma Integumentary (Skin) Complaints and Symptoms: Negative for: Wounds; Bleeding or bruising tendency; Breakdown; Swelling Medical History: Negative for: History of Burn; History of pressure wounds Musculoskeletal Complaints and Symptoms: Negative for: Muscle Pain; Muscle Weakness Medical History: Negative for: Gout; Rheumatoid Arthritis; Osteoarthritis; Osteomyelitis Neurologic Complaints and Symptoms: Negative for: Numbness/parasthesias; Focal/Weakness Medical History: Positive for: Neuropathy Negative for: Dementia; Quadriplegia; Paraplegia; Seizure Disorder Psychiatric Complaints and Symptoms: Negative for: Anxiety; Claustrophobia Medical History: Negative for: Anorexia/bulimia; Confinement Anxiety Oncologic Medical History: Negative for: Received Chemotherapy; Received Radiation Immunizations Pneumococcal Vaccine: Received Pneumococcal Vaccination: No Implantable Devices None [name], [name]. ([id number]) Family and Social History Current every day smoker; Marital Status - Divorced; Alcohol Use: Rarely; Drug Use: Prior History; Caffeine Use: Daily Electronic Signature(s) Signed: 12/15/2020 4:40:00 PM By: Linton Ham MD Signed: 12/15/2020 4:45:18 PM By: Georges Mouse, Minus Breeding RN Entered By: Georges Mouse, Minus Breeding on 12/15/2020 08:57:40 [name], [name]. ([id number]) -------------------------------------------------------------------------------- SuperBill Details Patient Name: Meriweather, Sarah Gutierrez. Date of Service: 12/15/2020 Medical Record Number: [id number] Patient Account Number: [id number] Date of Birth/Sex: [birth year] (50 y.o.  M) Treating RN: Carlene Coria Primary Care Provider: Thersa Salt Other Clinician: Referring Provider: Caroline More Treating Provider/Extender: Linton Ham  G Weeks in Treatment: 0 Diagnosis Coding ICD-10 Codes Code Description E11.621 Type 2 diabetes mellitus with foot ulcer T81.31XD Disruption of external operation (surgical) wound, not elsewhere classified, subsequent encounter L97.518 Non-pressure chronic ulcer of other part of right foot with other specified severity Facility Procedures CPT4 Code: [id number] Description: 99214 - WOUND CARE VISIT-LEV 4 EST PT Modifier: Quantity: 1 Physician Procedures CPT4 Code Description: [id number] WC PHYS LEVEL 3 o NEW PT Modifier: Quantity: 1 CPT4 Code Description: ICD-10 Diagnosis Description E11.621 Type 2 diabetes mellitus with foot ulcer T81.31XD Disruption of external operation (surgical) wound, not elsewhere classi L97.518 Non-pressure chronic ulcer of other part of right foot with  other speci Modifier: fied, subsequent enco fied severity Quantity: Personal assistant) Signed: 12/15/2020 4:40:00 PM By: Linton Ham MD Entered By: Linton Ham on 12/15/2020 10:28:13

## 2020-12-16 NOTE — Progress Notes (Signed)
ZACARIAS, KRAUTER ([id number]) Visit Report for 12/15/2020 Abuse/Suicide Risk Screen Details Patient Name: [name], Frank L. Date of Service: 12/15/2020 8:30 AM Medical Record Number: [id number] Patient Account Number: [id number] Date of Birth/Sex: 1970-11-14 (50 y.o. M) Treating RN: Dolan Amen Primary Care Eustolia Drennen: Thersa Salt Other Clinician: Referring Tyffany Waldrop: Caroline More Treating Roscoe Witts/Extender: Tito Dine in Treatment: 0 Abuse/Suicide Risk Screen Items Answer ABUSE RISK SCREEN: Has anyone close to you tried to hurt or harm you recentlyo No Do you feel uncomfortable with anyone in your familyo No Has anyone forced you do things that you didnot want to doo No Electronic Signature(s) Signed: 12/15/2020 4:45:18 PM By: Georges Mouse, Minus Breeding RN Entered By: Georges Mouse, Kenia on 12/15/2020 08:57:56 Will, Vasili L. ([id number]) -------------------------------------------------------------------------------- Activities of Daily Living Details Patient Name: [name], Frank L. Date of Service: 12/15/2020 8:30 AM Medical Record Number: [id number] Patient Account Number: [id number] Date of Birth/Sex: Aug 01, 1971 (50 y.o. M) Treating RN: Dolan Amen Primary Care Riggin Cuttino: Thersa Salt Other Clinician: Referring Jezabelle Chisolm: Caroline More Treating Chelsa Stout/Extender: Tito Dine in Treatment: 0 Activities of Daily Living Items Answer Activities of Daily Living (Please select one for each item) Drive Automobile Completely Able Take Medications Completely Able Use Telephone Completely Able Care for Appearance Completely Able Use Toilet Completely Able Bath / Shower Completely Able Dress Self Completely Able Feed Self Completely Able Walk Completely Able Get In / Out Bed Completely Able Housework Completely Able Prepare Meals Completely Able Handle Money Completely Able Shop for Self Completely Able Electronic Signature(s) Signed: 12/15/2020 4:45:18  PM By: Georges Mouse, Minus Breeding RN Entered By: Georges Mouse, Minus Breeding on 12/15/2020 08:58:24 [name] ([id number]) -------------------------------------------------------------------------------- Education Screening Details Patient Name: [name], Frank L. Date of Service: 12/15/2020 8:30 AM Medical Record Number: [id number] Patient Account Number: [id number] Date of Birth/Sex: May 27, 1971 (50 y.o. M) Treating RN: Dolan Amen Primary Care Consuela Widener: Thersa Salt Other Clinician: Referring Guillermina Shaft: Caroline More Treating Chez Bulnes/Extender: Tito Dine in Treatment: 0 Primary Learner Assessed: Patient Learning Preferences/Education Level/Primary Language Learning Preference: Explanation, Demonstration Highest Education Level: High School Preferred Language: English Cognitive Barrier Language Barrier: No Translator Needed: No Memory Deficit: No Emotional Barrier: No Cultural/Religious Beliefs Affecting Medical Care: No Physical Barrier Impaired Vision: No Impaired Hearing: No Decreased Hand dexterity: No Knowledge/Comprehension Knowledge Level: Low Comprehension Level: Medium Ability to understand written instructions: Medium Ability to understand verbal instructions: Medium Motivation Anxiety Level: Calm Cooperation: Cooperative Education Importance: Acknowledges Need Interest in Health Problems: Asks Questions Perception: Coherent Willingness to Engage in Self-Management Medium Activities: Readiness to Engage in Self-Management Medium Activities: Electronic Signature(s) Signed: 12/15/2020 4:45:18 PM By: Georges Mouse, Minus Breeding RN Entered By: Georges Mouse, Kenia on 12/15/2020 08:59:11 [name] ([id number]) -------------------------------------------------------------------------------- Fall Risk Assessment Details Patient Name: [name], Frank L. Date of Service: 12/15/2020 8:30 AM Medical Record Number: [id number] Patient Account Number:  [id number] Date of Birth/Sex: [birth year] (50 y.o. M) Treating RN: Dolan Amen Primary Care Lucus Lambertson: Thersa Salt Other Clinician: Referring Makar Slatter: Caroline More Treating Dejana Pugsley/Extender: Tito Dine in Treatment: 0 Fall Risk Assessment Items Have you had 2 or more falls in the last 12 monthso 0 Yes Have you had any fall that resulted in injury in the last 12 monthso 0 Yes FALLS RISK SCREEN History of falling - immediate or within 3 months 0 No Secondary diagnosis (Do you have 2 or more medical diagnoseso) 15 Yes Ambulatory aid None/bed rest/wheelchair/nurse 0 Yes Crutches/cane/walker 15 Yes Furniture 0 No Intravenous therapy Access/Saline/Heparin Lock 0 No Gait/Transferring  Normal/ bed rest/ wheelchair 0 Yes Weak (short steps with or without shuffle, stooped but able to lift head while walking, may 0 No seek support from furniture) Impaired (short steps with shuffle, may have difficulty arising from chair, head down, impaired 0 No balance) Mental Status Oriented to own ability 0 Yes Electronic Signature(s) Signed: 12/15/2020 4:45:18 PM By: Georges Mouse, Minus Breeding RN Entered By: Georges Mouse, Kenia on 12/15/2020 08:59:44 [name], Frank L. ([id number]) -------------------------------------------------------------------------------- Foot Assessment Details Patient Name: [name], Frank L. Date of Service: 12/15/2020 8:30 AM Medical Record Number: [id number] Patient Account Number: [id number] Date of Birth/Sex: Jan 29, 1971 (50 y.o. M) Treating RN: Dolan Amen Primary Care Naava Janeway: Thersa Salt Other Clinician: Referring Jackson Fetters: Caroline More Treating Bridgitte Felicetti/Extender: Tito Dine in Treatment: 0 Foot Assessment Items Site Locations + = Sensation present, - = Sensation absent, C = Callus, U = Ulcer R = Redness, W = Warmth, M = Maceration, PU = Pre-ulcerative lesion F = Fissure, S = Swelling, D = Dryness Assessment Right: Left: Other  Deformity: No No Prior Foot Ulcer: No No Prior Amputation: Yes No Charcot Joint: No No Ambulatory Status: Ambulatory With Help Assistance Device: Cane Gait: Steady Electronic Signature(s) Signed: 12/15/2020 4:45:18 PM By: Georges Mouse, Minus Breeding RN Entered By: Georges Mouse, Minus Breeding on 12/15/2020 09:11:27 [name] ([id number]) -------------------------------------------------------------------------------- Nutrition Risk Screening Details Patient Name: [name], Frank L. Date of Service: 12/15/2020 8:30 AM Medical Record Number: [id number] Patient Account Number: [id number] Date of Birth/Sex: 11/08/1970 (50 y.o. M) Treating RN: Dolan Amen Primary Care Ikey Omary: Thersa Salt Other Clinician: Referring Chekesha Behlke: Caroline More Treating Maxine Huynh/Extender: Tito Dine in Treatment: 0 Height (in): 74 Weight (lbs): 225 Body Mass Index (BMI): 28.9 Nutrition Risk Screening Items Score Screening NUTRITION RISK SCREEN: I have an illness or condition that made me change the kind and/or amount of food I eat 0 No I eat fewer than two meals per day 0 No I eat few fruits and vegetables, or milk products 0 No I have three or more drinks of beer, liquor or wine almost every day 0 No I have tooth or mouth problems that make it hard for me to eat 0 No I don't always have enough money to buy the food I need 0 No I eat alone most of the time 0 No I take three or more different prescribed or over-the-counter drugs a day 1 Yes Without wanting to, I have lost or gained 10 pounds in the last six months 0 No I am not always physically able to shop, cook and/or feed myself 0 No Nutrition Protocols Good Risk Protocol 0 No interventions needed Moderate Risk Protocol High Risk Proctocol Risk Level: Good Risk Score: 1 Electronic Signature(s) Signed: 12/15/2020 4:45:18 PM By: Georges Mouse, Minus Breeding RN Entered By: Georges Mouse, Minus Breeding on 12/15/2020 08:59:59

## 2020-12-22 ENCOUNTER — Encounter: Payer: Self-pay | Attending: Physician Assistant | Admitting: Physician Assistant

## 2020-12-22 ENCOUNTER — Other Ambulatory Visit: Payer: Self-pay

## 2020-12-22 DIAGNOSIS — T8131XD Disruption of external operation (surgical) wound, not elsewhere classified, subsequent encounter: Secondary | ICD-10-CM | POA: Insufficient documentation

## 2020-12-22 DIAGNOSIS — E1151 Type 2 diabetes mellitus with diabetic peripheral angiopathy without gangrene: Secondary | ICD-10-CM | POA: Insufficient documentation

## 2020-12-22 DIAGNOSIS — L97518 Non-pressure chronic ulcer of other part of right foot with other specified severity: Secondary | ICD-10-CM | POA: Insufficient documentation

## 2020-12-22 DIAGNOSIS — I2510 Atherosclerotic heart disease of native coronary artery without angina pectoris: Secondary | ICD-10-CM | POA: Insufficient documentation

## 2020-12-22 DIAGNOSIS — E11621 Type 2 diabetes mellitus with foot ulcer: Secondary | ICD-10-CM | POA: Insufficient documentation

## 2020-12-22 DIAGNOSIS — I10 Essential (primary) hypertension: Secondary | ICD-10-CM | POA: Insufficient documentation

## 2020-12-22 DIAGNOSIS — E1140 Type 2 diabetes mellitus with diabetic neuropathy, unspecified: Secondary | ICD-10-CM | POA: Insufficient documentation

## 2020-12-22 DIAGNOSIS — X58XXXA Exposure to other specified factors, initial encounter: Secondary | ICD-10-CM | POA: Insufficient documentation

## 2020-12-22 DIAGNOSIS — F17210 Nicotine dependence, cigarettes, uncomplicated: Secondary | ICD-10-CM | POA: Insufficient documentation

## 2020-12-22 NOTE — Progress Notes (Addendum)
FINNICK, OROSZ ([id number]) Visit Report for 12/22/2020 Chief Complaint Document Details Patient Name: [name], Frank L. Date of Service: 12/22/2020 1:45 PM Medical Record Number: [id number] Patient Account Number: [id number] Date of Birth/Sex: 1971/05/13 (50 y.o. M) Treating RN: Carlene Coria Primary Care Provider: Thersa Salt Other Clinician: Referring Provider: Thersa Salt Treating Provider/Extender: Skipper Cliche in Treatment: 1 Information Obtained from: Patient Chief Complaint 12/15/2020; patient is here for review of a surgical wound dehiscence following a right transmetatarsal amputation Electronic Signature(s) Signed: 12/22/2020 2:15:38 PM By: Worthy Keeler PA-C Entered By: Worthy Keeler on 12/22/2020 14:15:38 Stenberg, Adolf L. ([id number]) -------------------------------------------------------------------------------- HPI Details Patient Name: [name], Frank L. Date of Service: 12/22/2020 1:45 PM Medical Record Number: [id number] Patient Account Number: [id number] Date of Birth/Sex: 26-Mar-1971 (50 y.o. M) Treating RN: Carlene Coria Primary Care Provider: Thersa Salt Other Clinician: Referring Provider: Thersa Salt Treating Provider/Extender: Skipper Cliche in Treatment: 1 History of Present Illness HPI Description: ADMISSION 12/14/2020 This is a 50 year old man who is a type II diabetic. He developed an intense foot infection in February requiring admission to the hospital. He first underwent a right fifth ray amputation on 2/13 by Dr. Luana Shu of podiatry. 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 He spent a prolonged period of time in hospital from 2/9 through 2/26. He followed with Dr. Steva Ready of infectious disease completing Rocephin on 3/23 and then transitioned into Augmentin that he is still taking. In follow-up Dr. Luana Shu saw him on 3/25 noted that he is not changing  the dressing properly. He saw Dr. Caryl Comes on 4/22 I think Dr. Caryl Comes represcribe the Augmentin and recommended Betadine wet-to-dry. The wound is anteriorly but goes around his foot laterally into the mid plantar part of his foot. The patient also is a continued cigarette smoker. Past medical history includes type 2 diabetes, coronary artery disease, hypertension, hyperlipidemia. He was in the ER recently with shortness of breath and they question COPD but I will believe he has ever had pulmonary function test to document this. The patient had an arteriogram on 2/15; this showed atherosclerosis diffusely but did not show significant stenosis. No intervention. He was supposed to follow-up for full noninvasive studies with vascular he has not done this. 12/22/2020 upon evaluation today patient appears for reevaluation here in the clinic concerning issues that have been going on with the amputation site dehiscence of the right foot. With that being said this is a transmetatarsal amputation and does appear to be making some improvement based on the pictures from last week. With that being said I do think that overall the patient does making progress is still having some issues here with getting this to granulate in appropriately. Obviously is going to take some time. With that being said there does appear to be a suture remaining at this point that I did find on the lateral portion of the dorsal incision line. Electronic Signature(s) Signed: 12/22/2020 5:39:22 PM By: Worthy Keeler PA-C Entered By: Worthy Keeler on 12/22/2020 17:39:21 [name] ([id number]) -------------------------------------------------------------------------------- Physical Exam Details Patient Name: [name], [name] Date of Service: 12/22/2020 1:45 PM Medical Record Number: [id number] Patient Account Number: [id number] Date of Birth/Sex: 16-Apr-1971 (50 y.o. M) Treating RN: Carlene Coria Primary Care Provider: Thersa Salt Other  Clinician: Referring Provider: Thersa Salt Treating Provider/Extender: Jeri Cos Weeks in Treatment: 1 Constitutional Well-nourished and well-hydrated in no acute distress. Respiratory normal breathing  without difficulty. Psychiatric this patient is able to make decisions and demonstrates good insight into disease process. Alert and Oriented x 3. pleasant and cooperative. Notes Upon inspection patient's wound bed actually showed signs of good granulation epithelization at this point. There does not appear to be any evidence of infection which is great news and overall very pleased with where things stand today. Electronic Signature(s) Signed: 12/22/2020 5:39:36 PM By: Worthy Keeler PA-C Entered By: Worthy Keeler on 12/22/2020 17:39:36 [name] ([id number]) -------------------------------------------------------------------------------- Physician Orders Details Patient Name: Frank Dark L. Date of Service: 12/22/2020 1:45 PM Medical Record Number: [id number] Patient Account Number: [id number] Date of Birth/Sex: [birth year] (50 y.o. M) Treating RN: Carlene Coria Primary Care Provider: Thersa Salt Other Clinician: Referring Provider: Thersa Salt Treating Provider/Extender: Skipper Cliche in Treatment: 1 Verbal / Phone Orders: No Diagnosis Coding ICD-10 Coding Code Description E11.621 Type 2 diabetes mellitus with foot ulcer T81.31XD Disruption of external operation (surgical) wound, not elsewhere classified, subsequent encounter L97.518 Non-pressure chronic ulcer of other part of right foot with other specified severity Follow-up Appointments o Return Appointment in 1 week. Edema Control - Lymphedema / Segmental Compressive Device / Other o Elevate, Exercise Daily and Avoid Standing for Long Periods of Time. o Elevate legs to the level of the heart and pump ankles as often as possible o Elevate leg(s) parallel to the floor when sitting. Wound Treatment Wound  #1 - Amputation Site - Transmetatarsal Wound Laterality: Right Cleanser: Soap and Water (Generic) 1 x Per Day/30 Days Discharge Instructions: Gently cleanse wound with antibacterial soap, rinse and pat dry prior to dressing wounds Primary Dressing: MEDIHONEY Paste, 3.5 (oz) tube 1 x Per Day/30 Days Discharge Instructions: apply to wound bed Secondary Dressing: ABD Pad 5x9 (in/in) 1 x Per Day/30 Days Discharge Instructions: Cover with ABD pad Secondary Dressing: Gauze 1 x Per Day/30 Days Secured With: Kerlix Roll Sterile or Non-Sterile 6-ply 4.5x4 (yd/yd) 1 x Per Day/30 Days Discharge Instructions: Apply Kerlix as directed Electronic Signature(s) Signed: 12/22/2020 6:22:42 PM By: Worthy Keeler PA-C Signed: 01/01/2021 8:33:59 AM By: Carlene Coria RN Entered By: Carlene Coria on 12/22/2020 14:41:41 Wyke, Ladarious L. ([id number]) -------------------------------------------------------------------------------- Problem List Details Patient Name: Grosso, Kenna L. Date of Service: 12/22/2020 1:45 PM Medical Record Number: [id number] Patient Account Number: [id number] Date of Birth/Sex: Oct 07, 1970 (50 y.o. M) Treating RN: Carlene Coria Primary Care Provider: Thersa Salt Other Clinician: Referring Provider: Thersa Salt Treating Provider/Extender: Skipper Cliche in Treatment: 1 Active Problems ICD-10 Encounter Code Description Active Date MDM Diagnosis E11.621 Type 2 diabetes mellitus with foot ulcer 12/15/2020 No Yes T81.31XD Disruption of external operation (surgical) wound, not elsewhere 12/15/2020 No Yes classified, subsequent encounter L97.518 Non-pressure chronic ulcer of other part of right foot with other specified 12/15/2020 No Yes severity Inactive Problems Resolved Problems Electronic Signature(s) Signed: 12/22/2020 2:15:11 PM By: Worthy Keeler PA-C Entered By: Worthy Keeler on 12/22/2020 14:15:11 Trevathan, Kavontae L.  ([id number]) -------------------------------------------------------------------------------- Progress Note Details Patient Name: Dudzik, Amandeep L. Date of Service: 12/22/2020 1:45 PM Medical Record Number: [id number] Patient Account Number: [id number] Date of Birth/Sex: 1971-07-23 (50 y.o. M) Treating RN: Carlene Coria Primary Care Provider: Thersa Salt Other Clinician: Referring Provider: Thersa Salt Treating Provider/Extender: Skipper Cliche in Treatment: 1 Subjective Chief Complaint Information obtained from Patient 12/15/2020; patient is here for review of a surgical wound dehiscence following a right transmetatarsal amputation History of Present Illness (HPI) ADMISSION 12/14/2020 This is a 50 year old man who is a type II  diabetic. He developed an intense foot infection in February requiring admission to the hospital. He first underwent a right fifth ray amputation on 2/13 by Dr. Luana Shu of podiatry. 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 He spent a prolonged period of time in hospital from 2/9 through 2/26. He followed with Dr. Steva Ready of infectious disease completing Rocephin on 3/23 and then transitioned into Augmentin that he is still taking. In follow-up Dr. Luana Shu saw him on 3/25 noted that he is not changing the dressing properly. He saw Dr. Caryl Comes on 4/22 I think Dr. Caryl Comes represcribe the Augmentin and recommended Betadine wet-to-dry. The wound is anteriorly but goes around his foot laterally into the mid plantar part of his foot. The patient also is a continued cigarette smoker. Past medical history includes type 2 diabetes, coronary artery disease, hypertension, hyperlipidemia. He was in the ER recently with shortness of breath and they question COPD but I will believe he has ever had pulmonary function test to document this. The patient had an arteriogram on 2/15; this showed  atherosclerosis diffusely but did not show significant stenosis. No intervention. He was supposed to follow-up for full noninvasive studies with vascular he has not done this. 12/22/2020 upon evaluation today patient appears for reevaluation here in the clinic concerning issues that have been going on with the amputation site dehiscence of the right foot. With that being said this is a transmetatarsal amputation and does appear to be making some improvement based on the pictures from last week. With that being said I do think that overall the patient does making progress is still having some issues here with getting this to granulate in appropriately. Obviously is going to take some time. With that being said there does appear to be a suture remaining at this point that I did find on the lateral portion of the dorsal incision line. Objective Constitutional Well-nourished and well-hydrated in no acute distress. Vitals Time Taken: 1:50 AM, Height: 74 in, Weight: 225 lbs, BMI: 28.9, Temperature: 97.8 F, Pulse: 82 bpm, Respiratory Rate: 18 breaths/min, Blood Pressure: 143/89 mmHg. Respiratory normal breathing without difficulty. Psychiatric this patient is able to make decisions and demonstrates good insight into disease process. Alert and Oriented x 3. pleasant and cooperative. General Notes: Upon inspection patient's wound bed actually showed signs of good granulation epithelization at this point. There does not appear to be any evidence of infection which is great news and overall very pleased with where things stand today. Integumentary (Hair, Skin) Wound #1 status is Open. Original cause of wound was Surgical Injury. The date acquired was: 09/27/2020. The wound has been in treatment 1 weeks. The wound is located on the Right Amputation Site - Transmetatarsal. The wound measures 1.5cm length x 13cm width x 1.3cm depth; 15.315cm^2 area and 19.91cm^3 volume. There is Fat Layer (Subcutaneous Tissue)  exposed. There is no tunneling or undermining noted. There is a large amount of serosanguineous drainage noted. There is small (1-33%) pink granulation within the wound bed. There is a large (67-100%) amount of necrotic tissue within the wound bed including Adherent Slough. BRADIN, MCADORY ([id number]) Assessment Active Problems ICD-10 Type 2 diabetes mellitus with foot ulcer Disruption of external operation (surgical) wound, not elsewhere classified, subsequent encounter Non-pressure chronic ulcer of other part of right foot with other specified severity Plan Follow-up Appointments: Return Appointment in 1 week. Edema Control - Lymphedema / Segmental Compressive Device /  Other: Elevate, Exercise Daily and Avoid Standing for Long Periods of Time. Elevate legs to the level of the heart and pump ankles as often as possible Elevate leg(s) parallel to the floor when sitting. WOUND #1: - Amputation Site - Transmetatarsal Wound Laterality: Right Cleanser: Soap and Water (Generic) 1 x Per Day/30 Days Discharge Instructions: Gently cleanse wound with antibacterial soap, rinse and pat dry prior to dressing wounds Primary Dressing: MEDIHONEY Paste, 3.5 (oz) tube 1 x Per Day/30 Days Discharge Instructions: apply to wound bed Secondary Dressing: ABD Pad 5x9 (in/in) 1 x Per Day/30 Days Discharge Instructions: Cover with ABD pad Secondary Dressing: Gauze 1 x Per Day/30 Days Secured With: Kerlix Roll Sterile or Non-Sterile 6-ply 4.5x4 (yd/yd) 1 x Per Day/30 Days Discharge Instructions: Apply Kerlix as directed 1. Would recommend that we going continue with the wound care measures as before and the patient is in agreement the plan that includes the use of Medihoney which the patient does have to try to help currently with breaking down some of the necrotic tissue. He has been doing okay with that. 2. Using to cover with an ABD pad followed by roll gauze to secure. 3. Also recommend he continue to  monitor for any signs of infection. He did ask about walking although I think that he needs to limit his walking until we can get the plantar aspect of the wound healed then I will be okay with him walking as far as that is concerned right now I feel like this would be more problematic as far as keeping the wound open. We will see patient back for reevaluation in 1 week here in the clinic. If anything worsens or changes patient will contact our office for additional recommendations. Electronic Signature(s) Signed: 12/22/2020 5:41:34 PM By: Worthy Keeler PA-C Entered By: Worthy Keeler on 12/22/2020 17:41:34 Sferrazza, Taevin LMarland Kitchen ([id number]) -------------------------------------------------------------------------------- SuperBill Details Patient Name: [name], Edwar L. Date of Service: 12/22/2020 Medical Record Number: [id number] Patient Account Number: [id number] Date of Birth/Sex: 1971/01/21 (50 y.o. M) Treating RN: Carlene Coria Primary Care Provider: Thersa Salt Other Clinician: Referring Provider: Thersa Salt Treating Provider/Extender: Jeri Cos Weeks in Treatment: 1 Diagnosis Coding ICD-10 Codes Code Description E11.621 Type 2 diabetes mellitus with foot ulcer T81.31XD Disruption of external operation (surgical) wound, not elsewhere classified, subsequent encounter L97.518 Non-pressure chronic ulcer of other part of right foot with other specified severity Facility Procedures CPT4 Code: 09983382 Description: 99213 - WOUND CARE VISIT-LEV 3 EST PT Modifier: Quantity: 1 Physician Procedures CPT4 Code Description: [id number] 99213 - WC PHYS LEVEL 3 - EST PT Modifier: Quantity: 1 CPT4 Code Description: ICD-10 Diagnosis Description E11.621 Type 2 diabetes mellitus with foot ulcer T81.31XD Disruption of external operation (surgical) wound, not elsewhere classi L97.518 Non-pressure chronic ulcer of other part of right foot with  other speci Modifier: fied, subsequent enco fied  severity Quantity: Personal assistant) Signed: 12/22/2020 5:41:44 PM By: Worthy Keeler PA-C Entered By: Worthy Keeler on 12/22/2020 17:41:44

## 2020-12-25 ENCOUNTER — Telehealth: Payer: Self-pay | Admitting: Pharmacist

## 2020-12-25 NOTE — Telephone Encounter (Signed)
Patient failed to provide requested 2022 financial documentation. Unable to determine patient's eligibility status for [facility]. No additional medication assistance will be provided by [facility] without the required proof of income documentation. Patient notified by letter.  [place]

## 2020-12-29 ENCOUNTER — Ambulatory Visit: Payer: Self-pay | Admitting: Physician Assistant

## 2021-01-01 NOTE — Progress Notes (Addendum)
[name], [name] ([id number]) Visit Report for 12/22/2020 Arrival Information Details Patient Name: [name], Frank L. Date of Service: 12/22/2020 1:45 PM Medical Record Number: [id number] Patient Account Number: [id number] Date of Birth/Sex: 01-31-1971 (50 y.o. M) Treating RN: Donnamarie Poag Primary Care Bowman Higbie: Thersa Salt Other Clinician: Referring Madlynn Lundeen: Thersa Salt Treating Azlan Hanway/Extender: Skipper Cliche in Treatment: 1 Visit Information History Since Last Visit Added or deleted any medications: No Patient Arrived: Wheel Chair Had a fall or experienced change in No Arrival Time: 13:49 activities of daily living that may affect Accompanied By: friend risk of falls: Transfer Assistance: None Hospitalized since last visit: No Patient Identification Verified: Yes Pain Present Now: No Secondary Verification Process Completed: Yes Patient Has Alerts: Yes Patient Alerts: ***Type II Diabetic*** Electronic Signature(s) Signed: 12/22/2020 4:26:14 PM By: Donnamarie Poag Entered By: Donnamarie Poag on 12/22/2020 13:50:08 [name], Frank L. ([id number]) -------------------------------------------------------------------------------- Clinic Level of Care Assessment Details Patient Name: Freel, Aydeen L. Date of Service: 12/22/2020 1:45 PM Medical Record Number: [id number] Patient Account Number: [id number] Date of Birth/Sex: 1971/03/25 (50 y.o. M) Treating RN: Carlene Coria Primary Care Brocha Gilliam: Thersa Salt Other Clinician: Referring Kimberly Nieland: Thersa Salt Treating Peggy Loge/Extender: Skipper Cliche in Treatment: 1 Clinic Level of Care Assessment Items TOOL 4 Quantity Score X - Use when only an EandM is performed on FOLLOW-UP visit 1 0 ASSESSMENTS - Nursing Assessment / Reassessment X - Reassessment of Co-morbidities (includes updates in patient status) 1 10 X- 1 5 Reassessment of Adherence to Treatment Plan ASSESSMENTS - Wound and Skin Assessment / Reassessment X - Simple Wound  Assessment / Reassessment - one wound 1 5 []  - 0 Complex Wound Assessment / Reassessment - multiple wounds []  - 0 Dermatologic / Skin Assessment (not related to wound area) ASSESSMENTS - Focused Assessment []  - Circumferential Edema Measurements - multi extremities 0 []  - 0 Nutritional Assessment / Counseling / Intervention []  - 0 Lower Extremity Assessment (monofilament, tuning fork, pulses) []  - 0 Peripheral Arterial Disease Assessment (using hand held doppler) ASSESSMENTS - Ostomy and/or Continence Assessment and Care []  - Incontinence Assessment and Management 0 []  - 0 Ostomy Care Assessment and Management (repouching, etc.) PROCESS - Coordination of Care X - Simple Patient / Family Education for ongoing care 1 15 []  - 0 Complex (extensive) Patient / Family Education for ongoing care X- 1 10 Staff obtains Consents, Records, Test Results / Process Orders []  - 0 Staff telephones HHA, Nursing Homes / Clarify orders / etc []  - 0 Routine Transfer to another Facility (non-emergent condition) []  - 0 Routine Hospital Admission (non-emergent condition) []  - 0 New Admissions / Biomedical engineer / Ordering NPWT, Apligraf, etc. []  - 0 Emergency Hospital Admission (emergent condition) X- 1 10 Simple Discharge Coordination []  - 0 Complex (extensive) Discharge Coordination PROCESS - Special Needs []  - Pediatric / Minor Patient Management 0 []  - 0 Isolation Patient Management []  - 0 Hearing / Language / Visual special needs []  - 0 Assessment of Community assistance (transportation, D/C planning, etc.) []  - 0 Additional assistance / Altered mentation []  - 0 Support Surface(s) Assessment (bed, cushion, seat, etc.) INTERVENTIONS - Wound Cleansing / Measurement [name], Frank L. ([id number]) X- 1 5 Simple Wound Cleansing - one wound []  - 0 Complex Wound Cleansing - multiple wounds X- 1 5 Wound Imaging (photographs - any number of wounds) []  - 0 Wound Tracing (instead  of photographs) X- 1 5 Simple Wound Measurement - one wound []  - 0 Complex Wound Measurement - multiple wounds INTERVENTIONS -  Wound Dressings X - Small Wound Dressing one or multiple wounds 1 10 []  - 0 Medium Wound Dressing one or multiple wounds []  - 0 Large Wound Dressing one or multiple wounds X- 1 5 Application of Medications - topical []  - 0 Application of Medications - injection INTERVENTIONS - Miscellaneous []  - External ear exam 0 []  - 0 Specimen Collection (cultures, biopsies, blood, body fluids, etc.) []  - 0 Specimen(s) / Culture(s) sent or taken to Lab for analysis []  - 0 Patient Transfer (multiple staff / Civil Service fast streamer / Similar devices) []  - 0 Simple Staple / Suture removal (25 or less) []  - 0 Complex Staple / Suture removal (26 or more) []  - 0 Hypo / Hyperglycemic Management (close monitor of Blood Glucose) []  - 0 Ankle / Brachial Index (ABI) - do not check if billed separately X- 1 5 Vital Signs Has the patient been seen at the hospital within the last three years: Yes Total Score: 90 Level Of Care: New/Established - Level 3 Electronic Signature(s) Signed: 01/01/2021 8:33:59 AM By: Carlene Coria RN Entered By: Carlene Coria on 12/22/2020 14:42:57 [name], Frank L. ([id number]) -------------------------------------------------------------------------------- Encounter Discharge Information Details Patient Name: Frank Pancoast, [name] Date of Service: 12/22/2020 1:45 PM Medical Record Number: [id number] Patient Account Number: [id number] Date of Birth/Sex: 01-05-1971 (50 y.o. M) Treating RN: Donnamarie Poag Primary Care Kaitlynn Tramontana: Thersa Salt Other Clinician: Referring Aahana Elza: Thersa Salt Treating Krystel Fletchall/Extender: Skipper Cliche in Treatment: 1 Encounter Discharge Information Items Discharge Condition: Stable Ambulatory Status: Wheelchair Discharge Destination: Home Transportation: Private Auto Accompanied By: friend Schedule Follow-up Appointment:  Yes Clinical Summary of Care: Electronic Signature(s) Signed: 12/22/2020 4:26:14 PM By: Donnamarie Poag Entered By: Donnamarie Poag on 12/22/2020 15:00:31 [name], Frank L. ([id number]) -------------------------------------------------------------------------------- Lower Extremity Assessment Details Patient Name: [name], Frank L. Date of Service: 12/22/2020 1:45 PM Medical Record Number: [id number] Patient Account Number: [id number] Date of Birth/Sex: 04/17/1971 (50 y.o. M) Treating RN: Donnamarie Poag Primary Care Mylan Lengyel: Thersa Salt Other Clinician: Referring Belinda Schlichting: Thersa Salt Treating Uliana Brinker/Extender: Jeri Cos Weeks in Treatment: 1 Electronic Signature(s) Signed: 12/22/2020 4:26:14 PM By: Donnamarie Poag Entered By: Donnamarie Poag on 12/22/2020 14:03:19 Neto, Zayveon LMarland Kitchen ([id number]) -------------------------------------------------------------------------------- Multi Wound Chart Details Patient Name: [name], Frank L. Date of Service: 12/22/2020 1:45 PM Medical Record Number: [id number] Patient Account Number: [id number] Date of Birth/Sex: 1971-01-01 (50 y.o. M) Treating RN: Carlene Coria Primary Care Tashona Calk: Thersa Salt Other Clinician: Referring Ludell Zacarias: Thersa Salt Treating Micholas Drumwright/Extender: Skipper Cliche in Treatment: 1 Vital Signs Height(in): 72 Pulse(bpm): 75 Weight(lbs): 225 Blood Pressure(mmHg): 143/89 Body Mass Index(BMI): 29 Temperature(F): 97.8 Respiratory Rate(breaths/min): 18 Photos: [N/A:N/A] Wound Location: Right Amputation Site - N/A N/A Transmetatarsal Wounding Event: Surgical Injury N/A N/A Primary Etiology: Dehisced Wound N/A N/A Comorbid History: Hypertension, Myocardial Infarction, N/A N/A Peripheral Venous Disease, Type II Diabetes, Neuropathy Date Acquired: 09/27/2020 N/A N/A Weeks of Treatment: 1 N/A N/A Wound Status: Open N/A N/A Pending Amputation on Yes N/A N/A Presentation: Measurements L x W x D (cm) 1.5x13x1.3 N/A N/A Area (cm) : 15.315 N/A  N/A Volume (cm) : 19.91 N/A N/A % Reduction in Area: 16.70% N/A N/A % Reduction in Volume: 9.70% N/A N/A Classification: Full Thickness Without Exposed N/A N/A Support Structures Exudate Amount: Large N/A N/A Exudate Type: Serosanguineous N/A N/A Exudate Color: red, brown N/A N/A Granulation Amount: Small (1-33%) N/A N/A Granulation Quality: Pink N/A N/A Necrotic Amount: Large (67-100%) N/A N/A Exposed Structures: Fat Layer (Subcutaneous Tissue): N/A N/A Yes Fascia: No Tendon: No Muscle: No Joint: No  Bone: No [name], Frank L. ([id number]) Epithelialization: None N/A N/A Treatment Notes Electronic Signature(s) Signed: 01/01/2021 8:33:59 AM By: Carlene Coria RN Entered By: Carlene Coria on 12/22/2020 14:41:01 [name], Frank L. ([id number]) -------------------------------------------------------------------------------- [place] Details Patient Name: Frank Pancoast, Desiree L. Date of Service: 12/22/2020 1:45 PM Medical Record Number: [id number] Patient Account Number: [id number] Date of Birth/Sex: July 29, 1971 (50 y.o. M) Treating RN: Carlene Coria Primary Care Rondrick Barreira: Thersa Salt Other Clinician: Referring Mira Balon: Thersa Salt Treating Jason Hauge/Extender: Skipper Cliche in Treatment: 1 Active Inactive Electronic Signature(s) Signed: 01/30/2021 1:16:24 PM By: Gretta Cool BSN, RN, CWS, Kim RN, BSN Signed: 02/01/2021 8:02:31 AM By: Carlene Coria RN Previous Signature: 01/01/2021 8:33:59 AM Version By: Carlene Coria RN Entered By: Gretta Cool, BSN, RN, CWS, Kim on 01/30/2021 13:16:23 Inabinet, Zadie Rhine ([id number]) -------------------------------------------------------------------------------- Pain Assessment Details Patient Name: [name], Frank L. Date of Service: 12/22/2020 1:45 PM Medical Record Number: [id number] Patient Account Number: [id number] Date of Birth/Sex: 1971/04/26 (50 y.o. M) Treating RN: Donnamarie Poag Primary Care Kimble Hitchens: Thersa Salt Other Clinician: Referring  Nakeda Lebron: Thersa Salt Treating Emylee Decelle/Extender: Skipper Cliche in Treatment: 1 Active Problems Location of Pain Severity and Description of Pain Patient Has Paino No Site Locations Rate the pain. Current Pain Level: 0 Pain Management and Medication Current Pain Management: Electronic Signature(s) Signed: 12/22/2020 4:26:14 PM By: Donnamarie Poag Entered By: Donnamarie Poag on 12/22/2020 13:53:08 Kramar, Harvel L. ([id number]) -------------------------------------------------------------------------------- Patient/Caregiver Education Details Patient Name: Frank Pancoast, Tait L. Date of Service: 12/22/2020 1:45 PM Medical Record Number: [id number] Patient Account Number: [id number] Date of Birth/Gender: [birth year] (51 y.o. M) Treating RN: Carlene Coria Primary Care Physician: Thersa Salt Other Clinician: Referring Physician: Thersa Salt Treating Physician/Extender: Skipper Cliche in Treatment: 1 Education Assessment Education Provided To: Patient Education Topics Provided Wound/Skin Impairment: Methods: Explain/Verbal Responses: State content correctly Electronic Signature(s) Signed: 01/01/2021 8:33:59 AM By: Carlene Coria RN Entered By: Carlene Coria on 12/22/2020 14:43:12 [name], Frank L. ([id number]) -------------------------------------------------------------------------------- Wound Assessment Details Patient Name: [name], Reilley L. Date of Service: 12/22/2020 1:45 PM Medical Record Number: [id number] Patient Account Number: [id number] Date of Birth/Sex: 07/01/1971 (50 y.o. M) Treating RN: Donnamarie Poag Primary Care Koda Defrank: Thersa Salt Other Clinician: Referring Noheli Melder: Thersa Salt Treating Kasiya Burck/Extender: Jeri Cos Weeks in Treatment: 1 Wound Status Wound Number: 1 Primary Dehisced Wound Etiology: Wound Location: Right Amputation Site - Transmetatarsal Wound Open Wounding Event: Surgical Injury Status: Date Acquired: 09/27/2020 Comorbid Hypertension, Myocardial  Infarction, Peripheral Venous Weeks Of Treatment: 1 History: Disease, Type II Diabetes, Neuropathy Clustered Wound: No Pending Amputation On Presentation Photos Wound Measurements Length: (cm) 1.5 Width: (cm) 13 Depth: (cm) 1.3 Area: (cm) 15.315 Volume: (cm) 19.91 % Reduction in Area: 16.7% % Reduction in Volume: 9.7% Epithelialization: None Tunneling: No Undermining: No Wound Description Classification: Full Thickness Without Exposed Support Structu Exudate Amount: Large Exudate Type: Serosanguineous Exudate Color: red, brown res Foul Odor After Cleansing: No Slough/Fibrino Yes Wound Bed Granulation Amount: Small (1-33%) Exposed Structure Granulation Quality: Pink Fascia Exposed: No Necrotic Amount: Large (67-100%) Fat Layer (Subcutaneous Tissue) Exposed: Yes Necrotic Quality: Adherent Slough Tendon Exposed: No Muscle Exposed: No Joint Exposed: No Bone Exposed: No Electronic Signature(s) Signed: 12/22/2020 4:26:14 PM By: Donnamarie Poag Entered By: Donnamarie Poag on 12/22/2020 14:01:58 Drumheller, Josph L. ([id number]) -------------------------------------------------------------------------------- Vit[place] Details Patient Name: Frank Pancoast, Sequoyah L. Date of Service: 12/22/2020 1:45 PM Medical Record Number: [id number] Patient Account Number: [id number] Date of Birth/Sex: 29-May-1971 (50 y.o. M) Treating RN: Donnamarie Poag Primary Care Kaydence Baba: Thersa Salt Other Clinician: Referring Holdyn Poyser: Thersa Salt Treating Vickey Boak/Extender:  Jeri Cos Weeks in Treatment: 1 Vital Signs Time Taken: 01:50 Temperature (F): 97.8 Height (in): 74 Pulse (bpm): 82 Weight (lbs): 225 Respiratory Rate (breaths/min): 18 Body Mass Index (BMI): 28.9 Blood Pressure (mmHg): 143/89 Reference Range: 80 - 120 mg / dl Electronic Signature(s) Signed: 12/22/2020 4:26:14 PM By: Donnamarie Poag Entered ByDonnamarie Poag on 12/22/2020 13:53:02

## 2021-01-05 ENCOUNTER — Ambulatory Visit: Payer: Self-pay | Admitting: Physician Assistant

## 2021-01-12 ENCOUNTER — Ambulatory Visit: Payer: Self-pay | Admitting: Internal Medicine

## 2021-01-19 ENCOUNTER — Inpatient Hospital Stay (HOSPITAL_COMMUNITY)
Admission: AD | Admit: 2021-01-19 | Discharge: 2021-02-05 | DRG: 280 | Disposition: A | Discharge status: Skilled Nursing Facility | Payer: Medicaid Other | Source: Other Acute Inpatient Hospital | Attending: Cardiovascular Disease | Admitting: Cardiovascular Disease

## 2021-01-19 ENCOUNTER — Encounter
Admission: EM | Disposition: A | Discharge status: Home / Self Care | Payer: Self-pay | Source: Home / Self Care | Attending: Emergency Medicine

## 2021-01-19 ENCOUNTER — Ambulatory Visit
Admission: EM | Admit: 2021-01-19 | Discharge: 2021-01-19 | Disposition: A | Discharge status: Home / Self Care | Payer: Medicaid Other | Attending: Emergency Medicine | Admitting: Emergency Medicine

## 2021-01-19 DIAGNOSIS — I442 Atrioventricular block, complete: Secondary | ICD-10-CM | POA: Diagnosis present

## 2021-01-19 DIAGNOSIS — E1159 Type 2 diabetes mellitus with other circulatory complications: Secondary | ICD-10-CM | POA: Diagnosis present

## 2021-01-19 DIAGNOSIS — E1169 Type 2 diabetes mellitus with other specified complication: Secondary | ICD-10-CM | POA: Diagnosis present

## 2021-01-19 DIAGNOSIS — Y831 Surgical operation with implant of artificial internal device as the cause of abnormal reaction of the patient, or of later complication, without mention of misadventure at the time of the procedure: Secondary | ICD-10-CM | POA: Diagnosis present

## 2021-01-19 DIAGNOSIS — R55 Syncope and collapse: Secondary | ICD-10-CM | POA: Diagnosis not present

## 2021-01-19 DIAGNOSIS — Z794 Long term (current) use of insulin: Secondary | ICD-10-CM | POA: Diagnosis not present

## 2021-01-19 DIAGNOSIS — Z79899 Other long term (current) drug therapy: Secondary | ICD-10-CM | POA: Diagnosis not present

## 2021-01-19 DIAGNOSIS — Z791 Long term (current) use of non-steroidal anti-inflammatories (NSAID): Secondary | ICD-10-CM | POA: Insufficient documentation

## 2021-01-19 DIAGNOSIS — Z7984 Long term (current) use of oral hypoglycemic drugs: Secondary | ICD-10-CM | POA: Insufficient documentation

## 2021-01-19 DIAGNOSIS — I5043 Acute on chronic combined systolic (congestive) and diastolic (congestive) heart failure: Secondary | ICD-10-CM | POA: Diagnosis present

## 2021-01-19 DIAGNOSIS — M4802 Spinal stenosis, cervical region: Secondary | ICD-10-CM

## 2021-01-19 DIAGNOSIS — M50021 Cervical disc disorder at C4-C5 level with myelopathy: Secondary | ICD-10-CM | POA: Diagnosis present

## 2021-01-19 DIAGNOSIS — I10 Essential (primary) hypertension: Secondary | ICD-10-CM | POA: Insufficient documentation

## 2021-01-19 DIAGNOSIS — Z20822 Contact with and (suspected) exposure to covid-19: Secondary | ICD-10-CM | POA: Diagnosis present

## 2021-01-19 DIAGNOSIS — F17210 Nicotine dependence, cigarettes, uncomplicated: Secondary | ICD-10-CM | POA: Insufficient documentation

## 2021-01-19 DIAGNOSIS — Z833 Family history of diabetes mellitus: Secondary | ICD-10-CM

## 2021-01-19 DIAGNOSIS — I2121 ST elevation (STEMI) myocardial infarction involving left circumflex coronary artery: Secondary | ICD-10-CM

## 2021-01-19 DIAGNOSIS — IMO0002 Reserved for concepts with insufficient information to code with codable children: Secondary | ICD-10-CM | POA: Diagnosis present

## 2021-01-19 DIAGNOSIS — G959 Disease of spinal cord, unspecified: Secondary | ICD-10-CM

## 2021-01-19 DIAGNOSIS — N179 Acute kidney failure, unspecified: Secondary | ICD-10-CM | POA: Diagnosis present

## 2021-01-19 DIAGNOSIS — Z801 Family history of malignant neoplasm of trachea, bronchus and lung: Secondary | ICD-10-CM

## 2021-01-19 DIAGNOSIS — Z803 Family history of malignant neoplasm of breast: Secondary | ICD-10-CM

## 2021-01-19 DIAGNOSIS — T82867A Thrombosis of cardiac prosthetic devices, implants and grafts, initial encounter: Secondary | ICD-10-CM | POA: Diagnosis present

## 2021-01-19 DIAGNOSIS — E1151 Type 2 diabetes mellitus with diabetic peripheral angiopathy without gangrene: Secondary | ICD-10-CM | POA: Diagnosis present

## 2021-01-19 DIAGNOSIS — R262 Difficulty in walking, not elsewhere classified: Secondary | ICD-10-CM | POA: Diagnosis present

## 2021-01-19 DIAGNOSIS — M62830 Muscle spasm of back: Secondary | ICD-10-CM | POA: Diagnosis present

## 2021-01-19 DIAGNOSIS — R5381 Other malaise: Secondary | ICD-10-CM

## 2021-01-19 DIAGNOSIS — E785 Hyperlipidemia, unspecified: Secondary | ICD-10-CM | POA: Diagnosis present

## 2021-01-19 DIAGNOSIS — I2119 ST elevation (STEMI) myocardial infarction involving other coronary artery of inferior wall: Principal | ICD-10-CM | POA: Diagnosis present

## 2021-01-19 DIAGNOSIS — E1165 Type 2 diabetes mellitus with hyperglycemia: Secondary | ICD-10-CM | POA: Diagnosis present

## 2021-01-19 DIAGNOSIS — Z8249 Family history of ischemic heart disease and other diseases of the circulatory system: Secondary | ICD-10-CM

## 2021-01-19 DIAGNOSIS — I255 Ischemic cardiomyopathy: Secondary | ICD-10-CM | POA: Diagnosis present

## 2021-01-19 DIAGNOSIS — R339 Retention of urine, unspecified: Secondary | ICD-10-CM | POA: Diagnosis present

## 2021-01-19 DIAGNOSIS — Z955 Presence of coronary angioplasty implant and graft: Secondary | ICD-10-CM | POA: Diagnosis not present

## 2021-01-19 DIAGNOSIS — Z8674 Personal history of sudden cardiac arrest: Secondary | ICD-10-CM

## 2021-01-19 DIAGNOSIS — I480 Paroxysmal atrial fibrillation: Secondary | ICD-10-CM

## 2021-01-19 DIAGNOSIS — Z89431 Acquired absence of right foot: Secondary | ICD-10-CM

## 2021-01-19 DIAGNOSIS — I2510 Atherosclerotic heart disease of native coronary artery without angina pectoris: Secondary | ICD-10-CM | POA: Insufficient documentation

## 2021-01-19 DIAGNOSIS — R809 Proteinuria, unspecified: Secondary | ICD-10-CM | POA: Diagnosis present

## 2021-01-19 DIAGNOSIS — Z95811 Presence of heart assist device: Secondary | ICD-10-CM

## 2021-01-19 DIAGNOSIS — R29898 Other symptoms and signs involving the musculoskeletal system: Secondary | ICD-10-CM

## 2021-01-19 DIAGNOSIS — E1140 Type 2 diabetes mellitus with diabetic neuropathy, unspecified: Secondary | ICD-10-CM | POA: Diagnosis present

## 2021-01-19 DIAGNOSIS — I213 ST elevation (STEMI) myocardial infarction of unspecified site: Secondary | ICD-10-CM | POA: Insufficient documentation

## 2021-01-19 DIAGNOSIS — R531 Weakness: Secondary | ICD-10-CM | POA: Diagnosis present

## 2021-01-19 DIAGNOSIS — I2111 ST elevation (STEMI) myocardial infarction involving right coronary artery: Secondary | ICD-10-CM | POA: Diagnosis present

## 2021-01-19 DIAGNOSIS — K7581 Nonalcoholic steatohepatitis (NASH): Secondary | ICD-10-CM | POA: Diagnosis present

## 2021-01-19 DIAGNOSIS — R570 Cardiogenic shock: Secondary | ICD-10-CM | POA: Diagnosis present

## 2021-01-19 DIAGNOSIS — Z720 Tobacco use: Secondary | ICD-10-CM | POA: Diagnosis present

## 2021-01-19 DIAGNOSIS — I110 Hypertensive heart disease with heart failure: Secondary | ICD-10-CM | POA: Diagnosis present

## 2021-01-19 DIAGNOSIS — T383X6A Underdosing of insulin and oral hypoglycemic [antidiabetic] drugs, initial encounter: Secondary | ICD-10-CM | POA: Diagnosis present

## 2021-01-19 DIAGNOSIS — K7460 Unspecified cirrhosis of liver: Secondary | ICD-10-CM | POA: Diagnosis present

## 2021-01-19 HISTORY — PX: CORONARY/GRAFT ACUTE MI REVASCULARIZATION: CATH118305

## 2021-01-19 HISTORY — PX: IABP INSERTION: CATH118242

## 2021-01-19 HISTORY — PX: CORONARY THROMBECTOMY: CATH118304

## 2021-01-19 HISTORY — DX: Atherosclerotic heart disease of native coronary artery without angina pectoris: I25.10

## 2021-01-19 HISTORY — PX: LEFT HEART CATH AND CORONARY ANGIOGRAPHY: CATH118249

## 2021-01-19 LAB — COMPREHENSIVE METABOLIC PANEL
ALT: 34 U/L (ref 0–44)
AST: 36 U/L (ref 15–41)
Albumin: 2.9 g/dL — ABNORMAL LOW (ref 3.5–5.0)
Alkaline Phosphatase: 162 U/L — ABNORMAL HIGH (ref 38–126)
Anion gap: 11 (ref 5–15)
BUN: 11 mg/dL (ref 6–20)
CO2: 21 mmol/L — ABNORMAL LOW (ref 22–32)
Calcium: 8.3 mg/dL — ABNORMAL LOW (ref 8.9–10.3)
Chloride: 102 mmol/L (ref 98–111)
Creatinine, Ser: 1.12 mg/dL (ref 0.61–1.24)
GFR, Estimated: >60 mL/min (ref >60)
Glucose, Bld: 392 mg/dL — ABNORMAL HIGH (ref 70–99)
Potassium: 3.8 mmol/L (ref 3.5–5.1)
Sodium: 134 mmol/L — ABNORMAL LOW (ref 135–145)
Total Bilirubin: 0.9 mg/dL (ref 0.3–1.2)
Total Protein: 6.7 g/dL (ref 6.5–8.1)

## 2021-01-19 LAB — CBC WITH DIFFERENTIAL/PLATELET
Abs Immature Granulocytes: 0.05 10*3/uL (ref 0.00–0.07)
Basophils Absolute: 0 10*3/uL (ref 0.0–0.1)
Basophils Relative: 0 %
Eosinophils Absolute: 0.2 10*3/uL (ref 0.0–0.5)
Eosinophils Relative: 2 %
HCT: 45.4 % (ref 39.0–52.0)
Hemoglobin: 15.8 g/dL (ref 13.0–17.0)
Immature Granulocytes: 0 %
Lymphocytes Relative: 31 %
Lymphs Abs: 3.7 10*3/uL (ref 0.7–4.0)
MCH: 31.7 pg (ref 26.0–34.0)
MCHC: 34.8 g/dL (ref 30.0–36.0)
MCV: 91 fL (ref 80.0–100.0)
Monocytes Absolute: 0.5 10*3/uL (ref 0.1–1.0)
Monocytes Relative: 5 %
Neutro Abs: 7.4 10*3/uL (ref 1.7–7.7)
Neutrophils Relative %: 62 %
Platelets: 210 10*3/uL (ref 150–400)
RBC: 4.99 MIL/uL (ref 4.22–5.81)
RDW: 12.7 % (ref 11.5–15.5)
WBC: 11.8 10*3/uL — ABNORMAL HIGH (ref 4.0–10.5)
nRBC: 0 % (ref 0.0–0.2)

## 2021-01-19 LAB — LIPID PANEL
Cholesterol: 232 mg/dL — ABNORMAL HIGH (ref 0–200)
HDL: 39 mg/dL — ABNORMAL LOW (ref >40)
LDL Cholesterol: 156 mg/dL — ABNORMAL HIGH (ref 0–99)
Total CHOL/HDL Ratio: 5.9 RATIO
Triglycerides: 183 mg/dL — ABNORMAL HIGH (ref <150)
VLDL: 37 mg/dL (ref 0–40)

## 2021-01-19 LAB — POCT ACTIVATED CLOTTING TIME
Activated Clotting Time: 529 seconds
Activated Clotting Time: 369 seconds

## 2021-01-19 LAB — APTT: aPTT: 30 seconds (ref 24–36)

## 2021-01-19 LAB — RESP PANEL BY RT-PCR (FLU A&B, COVID) ARPGX2
Influenza A by PCR: NEGATIVE
Influenza B by PCR: NEGATIVE
SARS Coronavirus 2 by RT PCR: NEGATIVE

## 2021-01-19 LAB — PROTIME-INR
INR: 1 (ref 0.8–1.2)
Prothrombin Time: 12.9 seconds (ref 11.4–15.2)

## 2021-01-19 LAB — TROPONIN I (HIGH SENSITIVITY): Troponin I (High Sensitivity): 2012 ng/L (ref <18)

## 2021-01-19 SURGERY — CORONARY/GRAFT ACUTE MI REVASCULARIZATION

## 2021-01-19 MED ORDER — METOPROLOL TARTRATE 5 MG/5ML IV SOLN
INTRAVENOUS | Status: AC
  Filled 2021-01-19: qty 5

## 2021-01-19 MED ORDER — MIDAZOLAM HCL 2 MG/2ML IJ SOLN
INTRAMUSCULAR | Status: AC
  Filled 2021-01-19: qty 2

## 2021-01-19 MED ORDER — TIROFIBAN (AGGRASTAT) BOLUS VIA INFUSION
INTRAVENOUS | Status: DC | PRN
  Administered 2021-01-19: 2495 ug via INTRAVENOUS

## 2021-01-19 MED ORDER — HEPARIN SODIUM (PORCINE) 1000 UNIT/ML IJ SOLN
INTRAMUSCULAR | Status: DC | PRN
  Administered 2021-01-19: 12000 [IU] via INTRAVENOUS

## 2021-01-19 MED ORDER — DOPAMINE-DEXTROSE 3.2-5 MG/ML-% IV SOLN
INTRAVENOUS | Status: AC
  Filled 2021-01-19: qty 250

## 2021-01-19 MED ORDER — METOPROLOL TARTRATE 5 MG/5ML IV SOLN
INTRAVENOUS | Status: DC | PRN
  Administered 2021-01-19: 2.5 mg via INTRAVENOUS

## 2021-01-19 MED ORDER — IOHEXOL 300 MG/ML  SOLN
INTRAMUSCULAR | Status: DC | PRN
  Administered 2021-01-19: 130 mL via INTRA_ARTERIAL

## 2021-01-19 MED ORDER — SODIUM CHLORIDE 0.9 % IV BOLUS
1000.0000 mL | Freq: Once | INTRAVENOUS | Status: AC
  Administered 2021-01-19: 1000 mL via INTRAVENOUS

## 2021-01-19 MED ORDER — DOPAMINE-DEXTROSE 3.2-5 MG/ML-% IV SOLN
INTRAVENOUS | Status: DC | PRN
  Administered 2021-01-19: 20 ug/kg/min via INTRAVENOUS

## 2021-01-19 MED ORDER — VERAPAMIL HCL 2.5 MG/ML IV SOLN
INTRAVENOUS | Status: AC
  Filled 2021-01-19: qty 2

## 2021-01-19 MED ORDER — AMIODARONE HCL IN DEXTROSE 360-4.14 MG/200ML-% IV SOLN
60.0000 mg/h | INTRAVENOUS | Status: DC
  Administered 2021-01-19: 60 mg/h via INTRAVENOUS

## 2021-01-19 MED ORDER — AMIODARONE HCL IN DEXTROSE 360-4.14 MG/200ML-% IV SOLN
INTRAVENOUS | Status: AC
  Filled 2021-01-19: qty 200

## 2021-01-19 MED ORDER — MIDAZOLAM HCL 2 MG/2ML IJ SOLN
INTRAMUSCULAR | Status: DC | PRN
  Administered 2021-01-19: 1 mg via INTRAVENOUS
  Administered 2021-01-19: 2 mg via INTRAVENOUS

## 2021-01-19 MED ORDER — AMIODARONE LOAD VIA INFUSION
150.0000 mg | Freq: Once | INTRAVENOUS | Status: DC
  Filled 2021-01-19: qty 83.34

## 2021-01-19 MED ORDER — AMIODARONE HCL 150 MG/3ML IV SOLN
INTRAVENOUS | Status: DC | PRN
  Administered 2021-01-19: 150 mg via INTRAVENOUS

## 2021-01-19 MED ORDER — FENTANYL CITRATE (PF) 100 MCG/2ML IJ SOLN
INTRAMUSCULAR | Status: DC | PRN
  Administered 2021-01-19 (×2): 25 ug via INTRAVENOUS

## 2021-01-19 MED ORDER — DOPAMINE-DEXTROSE 3.2-5 MG/ML-% IV SOLN
0.0000 ug/kg/min | INTRAVENOUS | Status: DC
  Administered 2021-01-19: 10 ug/kg/min via INTRAVENOUS

## 2021-01-19 MED ORDER — FENTANYL CITRATE (PF) 100 MCG/2ML IJ SOLN
INTRAMUSCULAR | Status: AC
  Filled 2021-01-19: qty 2

## 2021-01-19 MED ORDER — HEPARIN (PORCINE) 25000 UT/250ML-% IV SOLN
INTRAVENOUS | Status: AC
  Filled 2021-01-19: qty 250

## 2021-01-19 MED ORDER — TICAGRELOR 90 MG PO TABS
ORAL_TABLET | ORAL | Status: DC | PRN
  Administered 2021-01-19: 180 mg via ORAL

## 2021-01-19 MED ORDER — HEPARIN (PORCINE) IN NACL 1000-0.9 UT/500ML-% IV SOLN
INTRAVENOUS | Status: DC | PRN
  Administered 2021-01-19: 1000 mL
  Administered 2021-01-19: 500 mL

## 2021-01-19 MED ORDER — TIROFIBAN HCL IV 12.5 MG/250 ML
INTRAVENOUS | Status: AC
  Filled 2021-01-19: qty 250

## 2021-01-19 MED ORDER — TIROFIBAN HCL IV 12.5 MG/250 ML
INTRAVENOUS | Status: DC | PRN
  Administered 2021-01-19: 0.15 ug/kg/min via INTRAVENOUS

## 2021-01-19 MED ORDER — HEPARIN SODIUM (PORCINE) 5000 UNIT/ML IJ SOLN
4000.0000 [IU] | Freq: Once | INTRAMUSCULAR | Status: AC
  Administered 2021-01-19: 4000 [IU] via INTRAVENOUS

## 2021-01-19 MED ORDER — AMIODARONE HCL IN DEXTROSE 360-4.14 MG/200ML-% IV SOLN: 30.0000 mg/h | INTRAVENOUS | Status: DC

## 2021-01-19 MED ORDER — TICAGRELOR 90 MG PO TABS
ORAL_TABLET | ORAL | Status: AC
  Filled 2021-01-19: qty 2

## 2021-01-19 MED ORDER — HEPARIN SODIUM (PORCINE) 1000 UNIT/ML IJ SOLN
INTRAMUSCULAR | Status: AC
  Filled 2021-01-19: qty 2

## 2021-01-19 MED ORDER — HEPARIN (PORCINE) 25000 UT/250ML-% IV SOLN: 1000.0000 [IU]/h | INTRAVENOUS | Status: DC

## 2021-01-19 MED ORDER — HEPARIN (PORCINE) IN NACL 1000-0.9 UT/500ML-% IV SOLN
INTRAVENOUS | Status: AC
  Filled 2021-01-19: qty 1000

## 2021-01-19 SURGICAL SUPPLY — 32 items
BALLN IABP SENSA PLUS 8F 50CC (BALLOONS) ×2
BALLN TREK RX 2.5X15 (BALLOONS) ×2
BALLN ~~LOC~~ TREK RX 2.75X15 (BALLOONS) ×2
BALLN ~~LOC~~ TREK RX 3.0X12 (BALLOONS) ×2
BALLOON IABP SENS PLUS 8F 50CC (BALLOONS) IMPLANT
BALLOON TREK RX 2.5X15 (BALLOONS) IMPLANT
BALLOON ~~LOC~~ TREK RX 2.75X15 (BALLOONS) IMPLANT
BALLOON ~~LOC~~ TREK RX 3.0X12 (BALLOONS) IMPLANT
CABLE ADAPT PACING TEMP 12FT (ADAPTER) ×1 IMPLANT
CATH EXTRAC PRONTO 5.5F 138CM (CATHETERS) ×1 IMPLANT
CATH INFINITI 5FR JL4 (CATHETERS) ×1 IMPLANT
CATH INFINITI 5FR JL5 (CATHETERS) ×1 IMPLANT
CATH LAUNCHER 6FR EBU 4 (CATHETERS) ×1 IMPLANT
CATH LAUNCHER 6FR JR4 (CATHETERS) ×1 IMPLANT
DEVICE RAD TR BAND REGULAR (VASCULAR PRODUCTS) IMPLANT
DRAPE BRACHIAL (DRAPES) ×1 IMPLANT
GLIDESHEATH SLEND SS 6F .021 (SHEATH) IMPLANT
KIT ENCORE 26 ADVANTAGE (KITS) ×1 IMPLANT
KIT MANI 3VAL PERCEP (MISCELLANEOUS) ×1 IMPLANT
NDL PERC 18GX7CM (NEEDLE) IMPLANT
NEEDLE PERC 18GX7CM (NEEDLE) ×2 IMPLANT
PROTECTION STATION PRESSURIZED (MISCELLANEOUS) ×2
SHEATH AVANTI 5FR X 11CM (SHEATH) IMPLANT
SHEATH AVANTI 6FR X 11CM (SHEATH) ×2 IMPLANT
SLEEVE REPOSITIONING LENGTH 30 (MISCELLANEOUS) ×1 IMPLANT
STATION PROTECTION PRESSURIZED (MISCELLANEOUS) IMPLANT
STENT RESOLUTE ONYX 2.5X15 (Permanent Stent) ×1 IMPLANT
VALVE COPILOT STAT (MISCELLANEOUS) ×1 IMPLANT
WIRE ASAHI FIELDER XT 300CM (WIRE) ×1 IMPLANT
WIRE ASAHI PROWATER 180CM (WIRE) ×2 IMPLANT
WIRE GUIDERIGHT .035X150 (WIRE) ×1 IMPLANT
WIRE PACING TEMP ST TIP 5 (CATHETERS) ×1 IMPLANT

## 2021-01-19 NOTE — ED Notes (Signed)
MD at bedside upon arrival.

## 2021-01-19 NOTE — Progress Notes (Signed)
PG for CODE STEMI; when [facility] arrived medical team were attending to pt.  [place] introduced himself briefly; no needs expressed aside from request for help locating cellphone.  ED secretary called EMS to see if phone had been left in truck but it had not been seen.  When [facility] returned, pt. in cath lab; per pt. and EMS, no family present.

## 2021-01-19 NOTE — Consult Note (Addendum)
Cardiology Consultation:   Patient ID: LOWELL MAKARA MRN: [id number]; DOB: [birth year]  Admit date: 01/19/2021 Date of Consult: 01/19/2021  PCP:  Coral Spikes, DO   [facility] Providers Cardiologist:  None   Dr. Saunders Revel     Patient Profile:   [name] is a 50 y.o. male with a hx of CAD who is being seen 01/19/2021 for the evaluation of syncope at the request of Dr.  Archie Balboa.  History of Present Illness:   Mr. Hoselton had a stent several years ago at [facility].  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  At that point, he stopped his antiplatelet therapy.   Records from the ER MD showed: "Patient states that he had a syncopal episode when he was walking around his truck.  In terms of previous medical complaints before the syncopal episode he states that it been a fairly normal day for him.  Denies any unusual activity or exertion.  He does state however he has been under a lot of stress.  When EMS got there EKG was concerning for ST elevation.  They did administer aspirin.  They withheld nitroglycerin given that the patient was hypotensive.  The patient states he has a history of heart attack and stent placement roughly 3 to 4 years ago at [facility].  He states he has been off of anticoagulation for the past few months after a foot amputation."  When I saw him in the emergency room, he was having some discomfort in his chest.  Upon arrival to the cath lab, his heart rate had slowed into the 40s and he was transiently losing consciousness.  Past Medical History:  Diagnosis Date  . Diabetes mellitus without complication ([place])   . Heart disease   . Myocardial infarction [facility])     Past Surgical History:  Procedure Laterality Date  . AMPUTATION Right 10/01/2020   Procedure: AMPUTATION 5th  RAY AND IRRIGATION AND DEBRIDEMENT;  Surgeon: Samara Deist, DPM;  Location: [facility] ORS;  Service: Podiatry;  Laterality: Right;  . AMPUTATION Right 10/11/2020    Procedure: AMPUTATION RAY;  Surgeon: Caroline More, DPM;  Location: [facility] ORS;  Service: Podiatry;  Laterality: Right;  . BACK SURGERY    . INCISION AND DRAINAGE Right 09/29/2020   Procedure: INCISION AND DRAINAGE, RIGHT FOOT;  Surgeon: Samara Deist, DPM;  Location: [facility] ORS;  Service: Podiatry;  Laterality: Right;  . LOWER EXTREMITY ANGIOGRAPHY Right 10/03/2020   Procedure: Lower Extremity Angiography;  Surgeon: Katha Cabal, MD;  Location: [place] CV LAB;  Service: Cardiovascular;  Laterality: Right;  . TENDON LENGTHENING Right 10/11/2020   Procedure: TENDON LENGTHENING;  Surgeon: Caroline More, DPM;  Location: [facility] ORS;  Service: Podiatry;  Laterality: Right;       Inpatient Medications: Scheduled Meds: . amiodarone  150 mg Intravenous Once   Continuous Infusions: . amiodarone 60 mg/hr (01/19/21 2223)  . [START ON 01/20/2021] amiodarone    . DOPamine    . DOPamine 30 mcg/kg/min (01/19/21 1660)  . DOPamine Stopped (01/19/21 2108)  . heparin    . tirofiban Stopped (01/19/21 2126)   PRN Meds:   Allergies:   No Known Allergies  Social History:   Social History   Socioeconomic History  . Marital status: Single    Spouse name: Not on file  . Number of children: Not on file  . Years of education: Not on file  . Highest education level: Not on file  Occupational History  . Not on file  Tobacco Use  . Smoking status: Current Every Day Smoker    Packs/day: 1.00    Types: Cigarettes  . Smokeless tobacco: Never Used  Substance and Sexual Activity  . Alcohol use: Yes    Alcohol/week: 0.0 - 1.0 standard drinks  . Drug use: No  . Sexual activity: Not on file  Other Topics Concern  . Not on file  Social History Narrative  . Not on file   Social Determinants of Health   Financial Resource Strain: Not on file  Food Insecurity: Not on file  Transportation Needs: Not on file  Physical Activity: Not on file  Stress: Not on file  Social Connections: Not on file   Intimate Partner Violence: Not on file    Family History:    Family History  Problem Relation Age of Onset  . Diabetes Father   . Heart disease Father   . Breast cancer Mother   . Lung cancer Paternal Grandmother      ROS:  Please see the history of present illness.  Syncope All other ROS reviewed and negative.     Physical Exam/Data:   Vitals:   01/19/21 2007 01/19/21 2021 01/19/21 2022 01/19/21 2023  BP: (!) 70/39  (!) 64/49   Pulse: (!) 43  (!) 41   Resp: (!) 22     Temp:  98.6 F (37 C)    TempSrc:  Axillary    SpO2: 94%     Weight:    99.8 kg  Height:    6' 2"  (1.88 m)   No intake or output data in the 24 hours ending 01/19/21 2226 Last 3 Weights 01/19/2021 11/02/2020 10/30/2020  Weight (lbs) 220 lb 220 lb 220 lb  Weight (kg) 99.791 kg 99.791 kg 99.791 kg     Body mass index is 28.25 kg/m.  General:  Well nourished, well developed, appears acutely ill HEENT: normal Lymph: no adenopathy Neck: no JVD Endocrine:  No thryomegaly Vascular: No carotid bruits; FA pulses 2+ bilaterally without bruits  Cardiac:  normal S1, S2; bradycardic;  Lungs:  clear to auscultation bilaterally, no wheezing, rhonchi or rales  Abd: soft, nontender, no hepatomegaly  Ext: no edema Musculoskeletal: Boot on his right foot due to amputation back in February Skin: warm and dry  Neuro:  CNs 2-12 intact, no focal abnormalities noted Psych:  Normal affect   EKG:  The EKG was personally reviewed and demonstrates:  Complete heart block, inferior ST elevations Telemetry:  Telemetry was personally reviewed and demonstrates:  bradycardia  Relevant CV Studies:   Laboratory Data:  High Sensitivity Troponin:   Recent Labs  Lab 01/19/21 2014  TROPONINIHS 2,012*     Chemistry Recent Labs  Lab 01/19/21 2014  NA 134*  K 3.8  CL 102  CO2 21*  GLUCOSE 392*  BUN 11  CREATININE 1.12  CALCIUM 8.3*  GFRNONAA >60  ANIONGAP 11    Recent Labs  Lab 01/19/21 2014  PROT 6.7  ALBUMIN  2.9*  AST 36  ALT 34  ALKPHOS 162*  BILITOT 0.9   Hematology Recent Labs  Lab 01/19/21 2014  WBC 11.8*  RBC 4.99  HGB 15.8  HCT 45.4  MCV 91.0  MCH 31.7  MCHC 34.8  RDW 12.7  PLT 210   BNPNo results for input(s): BNP, PROBNP in the last 168 hours.  DDimer No results for input(s): DDIMER in the last 168 hours.   Radiology/Studies:  No results  found.   Assessment and Plan:   1. 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  Due to hypotension from his bradycardia, we first obtained venous access and placed a temporary transvenous pacemaker.  His heart rate stabilized he remained somewhat hypotensive.  There appeared to be two acute vessel occlusions, the RCA and the circumflex. The cath showed late stent thrombosis of his RCA stent.  Aspiration thrombectomy was performed followed by balloon angioplasty.  TIMI-3 flow was restored.  I elected not to stent the RCA given that the circumflex was still occluded and he remained hypotensive.  It was not practical to perform intravascular imaging at that point due to his condition so I was not sure if his stent was undersized and that was the mode of his very late stent thrombosis.    We then moved on and revascularized his large circumflex vessel.  He had transient atrial fibrillation.  He converted to normal sinus rhythm after a small dose of metoprolol.  IV amiodarone was started.  Intra-aortic balloon pump was then placed due to hypotension.  I spoke with Dr. Saunders Revel regarding transfer to [facility] for his cardiogenic shock.  Transvenous pacer wire was removed as it was not maintaining position in the RV apex.  He ended up with A. fib with RVR and once he converted to sinus rhythm, was maintaining heart rate in the 70s.  Therefore, I did not think he needed the pacemaker for transport.  He was also given IV Aggrastat for the PCI.  He was started on IV  heparin drip for the balloon pump.  He stated that he lives alone and he did not want me to call any family members to update them about his condition or his transport to [facility].  I did end up speaking to his sister who is the emergency contact.    Risk Assessment/Risk Scores:     TIMI Risk Score for ST  Elevation MI:   The patient's TIMI risk score is 6, which indicates a 16.1% risk of all cause mortality at 30 days.           For questions or updates, please contact [place] Please consult [email or web address] for contact info under    Signed, Larae Grooms, MD  01/19/2021 10:26 PM

## 2021-01-19 NOTE — Consult Note (Signed)
ANTICOAGULATION CONSULT NOTE - Initial Consult  Pharmacy Consult for heparin infusion Indication: chest pain/ACS  No Known Allergies  Patient Measurements: Height: 6' 2"  (188 cm) Weight: 99.8 kg (220 lb) IBW/kg (Calculated) : 82.2 Heparin Dosing Weight: 99.8 kg  Vital Signs: Temp: 98.6 F (37 C) (06/03 2021) Temp Source: Axillary (06/03 2021) BP: 64/49 (06/03 2022) Pulse Rate: 41 (06/03 2022)  Labs: Recent Labs    01/19/21 2014  HGB 15.8  HCT 45.4  PLT 210  APTT 30  LABPROT 12.9  INR 1.0    CrCl cannot be calculated (Patient's most recent lab result is older than the maximum 21 days allowed.).   Medical History: Past Medical History:  Diagnosis Date  . Diabetes mellitus without complication ([place])   . Heart disease   . Myocardial infarction [facility])     Medications:  --Tirofiban bolus and infusion starting 6/3 2105  Assessment: 50 y.o. male who presented to the emergency department today as emergency traffic via EMS as a code STEMI. S/p emergent cath. Patient currently on aggrastat infusion. Pharmacy has been consulted for heparin infusion for ACS  Goal of Therapy:  Heparin level 0.3-0.5 units/ml Monitor platelets by anticoagulation protocol: Yes   Plan:  Start heparin infusion at 1000 units/hr given co-administration with tirofiban Given co-administration with tirofiban, will target lower end of HL goal (0.3-0.5)  Check HL and CBC in 6 hours   Dorothe Pea, PharmD, BCPS Clinical Pharmacist  01/19/2021,9:11 PM

## 2021-01-19 NOTE — ED Provider Notes (Signed)
[facility] Emergency Department Provider Note  ______________________________________   I have reviewed the triage vital signs and the nursing notes.   HISTORY  Chief Complaint Code STEMI   History limited by: Not Limited   HPI [name] is a 50 y.o. male who presents to the emergency department today as emergency traffic via EMS as a code STEMI.  Patient states that he had a syncopal episode when he was walking around his truck.  In terms of previous medical complaints before the syncopal episode he states that it been a fairly normal day for him.  Denies any unusual activity or exertion.  He does state however he has been under a lot of stress.  When EMS got there EKG was concerning for ST elevation.  They did administer aspirin.  They withheld nitroglycerin given that the patient was hypotensive.  The patient states he has a history of heart attack and stent placement roughly 3 to 4 years ago at [facility].  He states he has been off of anticoagulation for the past few months after a foot amputation.  Records reviewed. Per medical record review patient has a history of MI, DM.   Past Medical History:  Diagnosis Date  . Diabetes mellitus without complication ([place])   . Heart disease   . Myocardial infarction [facility])     Patient Active Problem List   Diagnosis Date Noted  . 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[place]) 10/13/2020  . Sepsis ([place]) 10/08/2020  . Cellulitis in diabetic foot ([place]) 09/27/2020  . Transaminitis 09/27/2020  . Coronary artery disease involving native coronary artery of native heart without angina pectoris 12/06/2015  . Uncontrolled type 2 diabetes mellitus with circulatory disorder, without long-term current use of insulin ([place]) 12/06/2015  . Preventative health care 12/06/2015  . Essential hypertension 12/06/2015  . Hyperlipidemia 12/06/2015  . Tobacco abuse 12/06/2015    Past Surgical History:  Procedure Laterality  Date  . AMPUTATION Right 10/01/2020   Procedure: AMPUTATION 5th  RAY AND IRRIGATION AND DEBRIDEMENT;  Surgeon: Samara Deist, DPM;  Location: [facility] ORS;  Service: Podiatry;  Laterality: Right;  . AMPUTATION Right 10/11/2020   Procedure: AMPUTATION RAY;  Surgeon: Caroline More, DPM;  Location: [facility] ORS;  Service: Podiatry;  Laterality: Right;  . BACK SURGERY    . INCISION AND DRAINAGE Right 09/29/2020   Procedure: INCISION AND DRAINAGE, RIGHT FOOT;  Surgeon: Samara Deist, DPM;  Location: [facility] ORS;  Service: Podiatry;  Laterality: Right;  . LOWER EXTREMITY ANGIOGRAPHY Right 10/03/2020   Procedure: Lower Extremity Angiography;  Surgeon: Katha Cabal, MD;  Location: [place] [facility];  Service: Cardiovascular;  Laterality: Right;  . TENDON LENGTHENING Right 10/11/2020   Procedure: TENDON LENGTHENING;  Surgeon: Caroline More, DPM;  Location: [facility] ORS;  Service: Podiatry;  Laterality: Right;    Prior to Admission medications   Medication Sig Start Date End Date Taking? Authorizing Provider  acetaminophen (TYLENOL) 325 MG tablet Take 2 tablets (650 mg total) by mouth every 4 (four) hours as needed for mild pain, fever or headache. 10/13/20   Nicole Kindred A, DO  albuterol (VENTOLIN HFA) 108 (90 Base) MCG/ACT inhaler Inhale 2 puffs into the lungs every 6 (six) hours as needed for wheezing or shortness of breath. 10/30/20   Merlyn Lot, MD  amoxicillin-clavulanate (AUGMENTIN) 875-125 MG tablet Take 1 tablet by mouth 2 (two) times daily. 11/07/20   Tsosie Billing, MD  amoxicillin-clavulanate (AUGMENTIN) 875-125 MG tablet TAKE ONE TABLET BY  MOUTH 2 TIMES A DAY 11/07/20 11/07/21  Tsosie Billing, MD  atorvastatin (LIPITOR) 40 MG tablet Take 1 tablet (40 mg total) by mouth daily. 12/06/15   Coral Spikes, DO  Blood Glucose Monitoring Suppl (RELION PRIME MONITOR) DEVI Use up to 4 times daily to check blood sugars. 12/06/15   Coral Spikes, DO  carvedilol (COREG) 3.125 MG tablet Take 1  tablet (3.125 mg total) by mouth 2 (two) times daily with a meal. 10/13/20   Nicole Kindred A, DO  carvedilol (COREG) 3.125 MG tablet TAKE ONE TABLET BY MOUTH 2 TIMES A DAY WITH MEALS 10/13/20 10/13/21  Nicole Kindred A, DO  fluticasone (FLONASE) 50 MCG/ACT nasal spray Place 2 sprays into both nostrils daily. 10/14/20   Ezekiel Slocumb, DO  fluticasone (FLONASE) 50 MCG/ACT nasal spray PLACE 2 SPRAYS INTO BOTH NOSTRILS EVERY DAY 10/13/20 10/13/21  Nicole Kindred A, DO  gabapentin (NEURONTIN) 300 MG capsule Take 2 capsules (600 mg total) by mouth at bedtime. 10/13/20   Ezekiel Slocumb, DO  gabapentin (NEURONTIN) 300 MG capsule TAKE ONE CAPSULE BY MOUTH EVERY MORNING 10/13/20 10/13/21  Nicole Kindred A, DO  gabapentin (NEURONTIN) 300 MG capsule TAKE 2 CAPSULES BY MOUTH AT BEDTIME 10/13/20 10/13/21  Nicole Kindred A, DO  glucose blood (RELION PRIME TEST) test strip Use as instructed 12/06/15   Coral Spikes, DO  Insulin Glargine (BASAGLAR KWIKPEN) 100 UNIT/ML Inject 30 Units into the skin daily. 10/13/20   Ezekiel Slocumb, DO  Insulin Glargine [facility] KWIKPEN) 100 UNIT/ML INJECT 30 UNITS INTO THE SKIN EVERY DAY 10/13/20 10/13/21  Nicole Kindred A, DO  insulin lispro (HUMALOG) 100 UNIT/ML KwikPen Inject 11 Units into the skin 3 (three) times daily with meals. 10/13/20   Nicole Kindred A, DO  insulin lispro (HUMALOG) 100 UNIT/ML KwikPen INJECT 11 UNITS INTO THE SKIN 3 TIMES A DAY WITH MEALS 10/13/20 10/13/21  Nicole Kindred A, DO  Insulin Pen Needle (PEN NEEDLES) 32G X 6 MM MISC 1 each by Does not apply route 4 (four) times daily -  before meals and at bedtime. 10/13/20   Nicole Kindred A, DO  Insulin Pen Needle 32G X 4 MM MISC USE AS DIRECTED 10/13/20 10/13/21  Nicole Kindred A, DO  lisinopril (PRINIVIL,ZESTRIL) 5 MG tablet TAKE ONE TABLET BY MOUTH ONCE DAILY 01/27/17   Coral Spikes, DO  meloxicam (MOBIC) 7.5 MG tablet Take 7.5 mg by mouth 2 (two) times daily. 09/10/20   [provider]  metFORMIN  (GLUCOPHAGE) 500 MG tablet TAKE ONE TABLET BY MOUTH TWICE DAILY WITH A MEAL 01/27/17   Cook, Jayce G, DO  methocarbamol (ROBAXIN) 750 MG tablet Take 1 tablet (750 mg total) by mouth every 6 (six) hours as needed for muscle spasms. 10/13/20   Ezekiel Slocumb, DO  ReliOn Ultra Thin Lancets MISC Use as directed to take blood sugar. 12/06/15   Coral Spikes, DO  sildenafil (VIAGRA) 25 MG tablet Take 1 tablet by mouth daily. 09/10/20   [provider]    Allergies Patient has no known allergies.  Family History  Problem Relation Age of Onset  . Diabetes Father   . Heart disease Father   . Breast cancer Mother   . Lung cancer Paternal Grandmother     Social History Social History   Tobacco Use  . Smoking status: Current Every Day Smoker    Packs/day: 1.00    Types: Cigarettes  . Smokeless tobacco: Never Used  Substance Use Topics  .  Alcohol use: Yes    Alcohol/week: 0.0 - 1.0 standard drinks  . Drug use: No    Review of Systems Constitutional: No fever/chills Eyes: No visual changes. ENT: No sore throat. Cardiovascular: Denies chest pain. Respiratory: Denies shortness of breath. Gastrointestinal: No abdominal pain.  No nausea, no vomiting.  No diarrhea.   Genitourinary: Negative for dysuria. Musculoskeletal: Positive for right shoulder pain, right arm heaviness.  Skin: Negative for rash. Neurological: Positive for syncopal episode.  ____________________________________________   PHYSICAL EXAM:  VITAL SIGNS: ED Triage Vitals  Enc Vitals Group     BP 01/19/21 2007 (!) 70/39     Pulse Rate 01/19/21 2007 (!) 43     Resp 01/19/21 2007 (!) 22     Temp 01/19/21 2021 98.6 F (37 C)     Temp Source 01/19/21 2021 Axillary     SpO2 01/19/21 2007 94 %     Weight 01/19/21 2023 220 lb (99.8 kg)     Height 01/19/21 2023 6' 2"  (1.88 m)   Constitutional: Alert and oriented.  Eyes: Conjunctivae are normal.  ENT      Head: Normocephalic and atraumatic.      Nose: No  congestion/rhinnorhea.      Mouth/Throat: Mucous membranes are moist.      Neck: No stridor. Hematological/Lymphatic/Immunilogical: No cervical lymphadenopathy. Cardiovascular: Bradycardic, regular rhythm.  No murmurs, rubs, or gallops.  Respiratory: Normal respiratory effort without tachypnea nor retractions. Breath sounds are clear and equal bilaterally. No wheezes/rales/rhonchi. Gastrointestinal: Soft and non tender. No rebound. No guarding.  Genitourinary: Deferred Musculoskeletal: Normal range of motion in all extremities. No lower extremity edema. Neurologic:  Normal speech and language. No gross focal neurologic deficits are appreciated.  Skin:  Diaphoretic Psychiatric: Mood and affect are normal. Speech and behavior are normal. Patient exhibits appropriate insight and judgment.  ____________________________________________    LABS (pertinent positives/negatives)  Pending   ____________________________________________   EKG  12 lead from EMS consistent with 3rd degree heart block and inferior STEMI  ____________________________________________    RADIOLOGY  None  ____________________________________________   PROCEDURES  Procedures  CRITICAL CARE Performed by: Nance Pear   Total critical care time: 25 minutes  Critical care time was exclusive of separately billable procedures and treating other patients.  Critical care was necessary to treat or prevent imminent or life-threatening deterioration.  Critical care was time spent personally by me on the following activities: development of treatment plan with patient and/or surrogate as well as nursing, discussions with consultants, evaluation of patient's response to treatment, examination of patient, obtaining history from patient or surrogate, ordering and performing treatments and interventions, ordering and review of laboratory studies, ordering and review of radiographic studies, pulse oximetry and  re-evaluation of patient's condition.  ____________________________________________   INITIAL IMPRESSION / ASSESSMENT AND PLAN / ED COURSE  Pertinent labs & imaging results that were available during my care of the patient were reviewed by me and considered in my medical decision making (see chart for details).   Patient presented to the emergency department today as a code STEMI via EMS in emergency traffic.  EKG obtained from EMS was consistent with ST elevation MI as well as third-degree heart block.  Patient had been given aspirin by EMS was given heparin here in the emergency department.  Dr. Irish Lack with cardiology did take the patient emergently to the catheterization lab.   ____________________________________________   FINAL CLINICAL IMPRESSION(S) / ED DIAGNOSES  Final diagnoses:  Third degree heart block ([place])  ST  elevation myocardial infarction (STEMI), unspecified artery [facility])     Note: This dictation was prepared with Dragon dictation. Any transcriptional errors that result from this process are unintentional     Nance Pear, MD 01/19/21 2046

## 2021-01-19 NOTE — ED Triage Notes (Addendum)
Pt presents to the ER as a code Stemi. Per EMS, pt had a syncopal episode initially pale and diaphoretic and hypotensive. BP low 80s approx x1L NS administered. x324 ASA administered. Hx of MI and stents placed.

## 2021-01-19 NOTE — ED Notes (Signed)
Departure to Cath lab via x2 RNs.

## 2021-01-19 NOTE — ED Notes (Signed)
Cardiology at bedside.

## 2021-01-20 ENCOUNTER — Inpatient Hospital Stay (HOSPITAL_COMMUNITY): Payer: Medicaid Other

## 2021-01-20 DIAGNOSIS — I469 Cardiac arrest, cause unspecified: Secondary | ICD-10-CM

## 2021-01-20 DIAGNOSIS — R570 Cardiogenic shock: Secondary | ICD-10-CM

## 2021-01-20 DIAGNOSIS — I480 Paroxysmal atrial fibrillation: Secondary | ICD-10-CM

## 2021-01-20 DIAGNOSIS — I442 Atrioventricular block, complete: Secondary | ICD-10-CM

## 2021-01-20 DIAGNOSIS — I213 ST elevation (STEMI) myocardial infarction of unspecified site: Secondary | ICD-10-CM

## 2021-01-20 DIAGNOSIS — E119 Type 2 diabetes mellitus without complications: Secondary | ICD-10-CM

## 2021-01-20 DIAGNOSIS — I219 Acute myocardial infarction, unspecified: Secondary | ICD-10-CM

## 2021-01-20 DIAGNOSIS — I2510 Atherosclerotic heart disease of native coronary artery without angina pectoris: Secondary | ICD-10-CM

## 2021-01-20 LAB — ECHOCARDIOGRAM COMPLETE
Area-P 1/2: 4.44 cm2
Height: 74 in
S' Lateral: 4.1 cm
Weight: 3520 oz

## 2021-01-20 LAB — BASIC METABOLIC PANEL
Anion gap: 13 (ref 5–15)
BUN: 19 mg/dL (ref 6–20)
CO2: 18 mmol/L — ABNORMAL LOW (ref 22–32)
Calcium: 8.1 mg/dL — ABNORMAL LOW (ref 8.9–10.3)
Chloride: 99 mmol/L (ref 98–111)
Creatinine, Ser: 1.43 mg/dL — ABNORMAL HIGH (ref 0.61–1.24)
GFR, Estimated: 60 mL/min — ABNORMAL LOW (ref >60)
Glucose, Bld: 450 mg/dL — ABNORMAL HIGH (ref 70–99)
Potassium: 4 mmol/L (ref 3.5–5.1)
Sodium: 130 mmol/L — ABNORMAL LOW (ref 135–145)

## 2021-01-20 LAB — CBC
HCT: 42.9 % (ref 39.0–52.0)
Hemoglobin: 14.9 g/dL (ref 13.0–17.0)
MCH: 31.4 pg (ref 26.0–34.0)
MCHC: 34.7 g/dL (ref 30.0–36.0)
MCV: 90.3 fL (ref 80.0–100.0)
Platelets: 186 10*3/uL (ref 150–400)
RBC: 4.75 MIL/uL (ref 4.22–5.81)
RDW: 12.7 % (ref 11.5–15.5)
WBC: 14 10*3/uL — ABNORMAL HIGH (ref 4.0–10.5)
nRBC: 0 % (ref 0.0–0.2)

## 2021-01-20 LAB — MRSA PCR SCREENING: MRSA by PCR: NEGATIVE

## 2021-01-20 LAB — LIPID PANEL
Cholesterol: 202 mg/dL — ABNORMAL HIGH (ref 0–200)
HDL: 42 mg/dL (ref >40)
LDL Cholesterol: 136 mg/dL — ABNORMAL HIGH (ref 0–99)
Total CHOL/HDL Ratio: 4.8 RATIO
Triglycerides: 120 mg/dL (ref <150)
VLDL: 24 mg/dL (ref 0–40)

## 2021-01-20 LAB — GLUCOSE, CAPILLARY
Glucose-Capillary: 391 mg/dL — ABNORMAL HIGH (ref 70–99)
Glucose-Capillary: 361 mg/dL — ABNORMAL HIGH (ref 70–99)
Glucose-Capillary: 324 mg/dL — ABNORMAL HIGH (ref 70–99)
Glucose-Capillary: 211 mg/dL — ABNORMAL HIGH (ref 70–99)
Glucose-Capillary: 149 mg/dL — ABNORMAL HIGH (ref 70–99)

## 2021-01-20 LAB — HEPARIN LEVEL (UNFRACTIONATED): Heparin Unfractionated: <0.1 IU/mL — ABNORMAL LOW (ref 0.30–0.70)

## 2021-01-20 MED ORDER — ATORVASTATIN CALCIUM 40 MG PO TABS
40.0000 mg | ORAL_TABLET | Freq: Every day | ORAL | Status: DC
  Administered 2021-01-20 – 2021-01-21 (×2): 40 mg via ORAL
  Filled 2021-01-20 (×2): qty 1

## 2021-01-20 MED ORDER — CHLORHEXIDINE GLUCONATE CLOTH 2 % EX PADS
6.0000 | MEDICATED_PAD | Freq: Every day | CUTANEOUS | Status: DC
  Administered 2021-01-20 – 2021-02-03 (×8): 6 via TOPICAL

## 2021-01-20 MED ORDER — INSULIN GLARGINE 100 UNIT/ML ~~LOC~~ SOLN
30.0000 [IU] | Freq: Every day | SUBCUTANEOUS | Status: DC
  Administered 2021-01-20 – 2021-01-30 (×11): 30 [IU] via SUBCUTANEOUS
  Filled 2021-01-20 (×14): qty 0.3

## 2021-01-20 MED ORDER — GABAPENTIN 300 MG PO CAPS
300.0000 mg | ORAL_CAPSULE | Freq: Every morning | ORAL | Status: DC
  Administered 2021-01-20 – 2021-01-21 (×2): 300 mg via ORAL
  Filled 2021-01-20 (×2): qty 1

## 2021-01-20 MED ORDER — GABAPENTIN 300 MG PO CAPS
600.0000 mg | ORAL_CAPSULE | Freq: Every day | ORAL | Status: DC
  Administered 2021-01-20 – 2021-01-21 (×3): 600 mg via ORAL
  Filled 2021-01-20 (×3): qty 2

## 2021-01-20 MED ORDER — INSULIN ASPART 100 UNIT/ML IJ SOLN
6.0000 [IU] | Freq: Three times a day (TID) | INTRAMUSCULAR | Status: DC
  Administered 2021-01-20 – 2021-01-30 (×28): 6 [IU] via SUBCUTANEOUS

## 2021-01-20 MED ORDER — ACETAMINOPHEN 325 MG PO TABS
650.0000 mg | ORAL_TABLET | ORAL | Status: DC | PRN
  Administered 2021-01-20 – 2021-02-04 (×22): 650 mg via ORAL
  Filled 2021-01-20 (×23): qty 2

## 2021-01-20 MED ORDER — CARVEDILOL 3.125 MG PO TABS
3.1250 mg | ORAL_TABLET | Freq: Two times a day (BID) | ORAL | Status: DC
  Administered 2021-01-20 – 2021-01-25 (×10): 3.125 mg via ORAL
  Filled 2021-01-20 (×10): qty 1

## 2021-01-20 MED ORDER — PERFLUTREN LIPID MICROSPHERE
1.0000 mL | INTRAVENOUS | Status: AC | PRN
  Administered 2021-01-20: 3 mL via INTRAVENOUS
  Filled 2021-01-20: qty 10

## 2021-01-20 MED ORDER — MELOXICAM 7.5 MG PO TABS: 7.5000 mg | ORAL_TABLET | Freq: Two times a day (BID) | ORAL | Status: DC

## 2021-01-20 MED ORDER — ASPIRIN EC 81 MG PO TBEC
81.0000 mg | DELAYED_RELEASE_TABLET | Freq: Every day | ORAL | Status: DC
  Administered 2021-01-21 – 2021-02-05 (×16): 81 mg via ORAL
  Filled 2021-01-20 (×17): qty 1

## 2021-01-20 MED ORDER — INSULIN ASPART 100 UNIT/ML IJ SOLN
0.0000 [IU] | Freq: Three times a day (TID) | INTRAMUSCULAR | Status: DC
Start: 2021-01-20 — End: 2021-02-06
  Administered 2021-01-20: 11 [IU] via SUBCUTANEOUS
  Administered 2021-01-20: 5 [IU] via SUBCUTANEOUS
  Administered 2021-01-20: 15 [IU] via SUBCUTANEOUS
  Administered 2021-01-21 (×2): 5 [IU] via SUBCUTANEOUS
  Administered 2021-01-21: 3 [IU] via SUBCUTANEOUS
  Administered 2021-01-22: 5 [IU] via SUBCUTANEOUS
  Administered 2021-01-22 (×2): 3 [IU] via SUBCUTANEOUS
  Administered 2021-01-23 (×2): 2 [IU] via SUBCUTANEOUS
  Administered 2021-01-23 – 2021-01-24 (×2): 3 [IU] via SUBCUTANEOUS
  Administered 2021-01-24 – 2021-01-27 (×6): 2 [IU] via SUBCUTANEOUS
  Administered 2021-01-27 – 2021-01-28 (×3): 3 [IU] via SUBCUTANEOUS
  Administered 2021-01-29 – 2021-02-02 (×4): 2 [IU] via SUBCUTANEOUS
  Administered 2021-02-02: 3 [IU] via SUBCUTANEOUS
  Administered 2021-02-02: 1 [IU] via SUBCUTANEOUS
  Administered 2021-02-03 – 2021-02-04 (×3): 2 [IU] via SUBCUTANEOUS

## 2021-01-20 MED ORDER — NITROGLYCERIN 0.4 MG SL SUBL: 0.4000 mg | SUBLINGUAL_TABLET | SUBLINGUAL | Status: DC | PRN

## 2021-01-20 MED ORDER — INSULIN GLARGINE 100 UNIT/ML ~~LOC~~ SOLN: 15.0000 [IU] | Freq: Every day | SUBCUTANEOUS | Status: DC

## 2021-01-20 MED ORDER — METHOCARBAMOL 750 MG PO TABS
750.0000 mg | ORAL_TABLET | Freq: Four times a day (QID) | ORAL | Status: DC | PRN
  Administered 2021-01-20 – 2021-01-31 (×34): 750 mg via ORAL
  Filled 2021-01-20 (×2): qty 1
  Filled 2021-01-20 (×2): qty 2
  Filled 2021-01-20 (×2): qty 1
  Filled 2021-01-20 (×2): qty 2
  Filled 2021-01-20 (×2): qty 1
  Filled 2021-01-20: qty 2
  Filled 2021-01-20: qty 1
  Filled 2021-01-20 (×2): qty 2
  Filled 2021-01-20 (×2): qty 1
  Filled 2021-01-20: qty 2
  Filled 2021-01-20 (×8): qty 1
  Filled 2021-01-20: qty 2
  Filled 2021-01-20: qty 1
  Filled 2021-01-20 (×2): qty 2
  Filled 2021-01-20 (×4): qty 1
  Filled 2021-01-20: qty 2

## 2021-01-20 MED ORDER — TICAGRELOR 90 MG PO TABS
90.0000 mg | ORAL_TABLET | Freq: Two times a day (BID) | ORAL | Status: DC
  Administered 2021-01-21 – 2021-02-05 (×31): 90 mg via ORAL
  Filled 2021-01-20 (×31): qty 1

## 2021-01-20 MED ORDER — HEPARIN (PORCINE) 25000 UT/250ML-% IV SOLN
1000.0000 [IU]/h | INTRAVENOUS | Status: DC
  Administered 2021-01-20: 1000 [IU]/h via INTRAVENOUS

## 2021-01-20 MED ORDER — ONDANSETRON HCL 4 MG/2ML IJ SOLN: 4.0000 mg | Freq: Four times a day (QID) | INTRAMUSCULAR | Status: DC | PRN

## 2021-01-20 NOTE — H&P (Signed)
Cardiology Admission History and Physical:   Patient ID: [name] MRN: [id number]; DOB: 1971-03-16   Admission date: 01/19/2021  PCP:  Coral Spikes, DO   [facility] Providers Cardiologist:  None   {  Chief Complaint:  Syncope   Patient Profile:   [name] is a 50 y.o. 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  History of Present Illness:   Mr. [name] was in his usual state of health until the day of admission.  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  He walked around the back of his trunk and had sudden loss of consciousness.  He he awoke after EMS arrived.  He had no chest discomfort before or after this event.  He had no dyspnea before or after this event.  He had no lightheadedness, dizziness, or presyncope which previously preceded the event.  He has never had an event like this before.  When EMS arrived, EKG was concerning for ST elevation.  He was brought emergently to [facility].  Upon arrival, he was noted to be bradycardic to the 40s with transient losses of consciousness.  He was taken emergently for cardiac catheterization where he was found to have 2 acute lesions: Late in-stent thrombosis of the RCA and acute occlusion of the left circumflex.  He received aspiration thrombectomy and balloon angioplasty of the RCA and PCI to the left circumflex.  He had atrial fibrillation with RVR during the case and was treated with low-dose metoprolol as well as IV amiodarone.  Due to hypotension, and intra-aortic balloon pump was placed.  He was supported by a transvenous pacemaker during the procedure.  After revascularization, his heart rate improved and the transvenous pacemaker was  removed.  On arrival, he reports that he still has no chest discomfort.  He is having severe back spasms, which have been a normal occurrence for him since the time of his amputation.  He is lying flat with no dyspnea and no lightheadedness or dizziness.   Past Medical History:  Diagnosis Date  . Diabetes mellitus without complication ([place])   . Heart disease   . Myocardial infarction [facility])     Past Surgical History:  Procedure Laterality Date  . AMPUTATION Right 10/01/2020   Procedure: AMPUTATION 5th  RAY AND IRRIGATION AND DEBRIDEMENT;  Surgeon: Samara Deist, DPM;  Location: [facility] ORS;  Service: Podiatry;  Laterality: Right;  . AMPUTATION Right 10/11/2020   Procedure: AMPUTATION RAY;  Surgeon: Caroline More, DPM;  Location: [facility] ORS;  Service: Podiatry;  Laterality: Right;  . BACK SURGERY    . INCISION AND DRAINAGE Right 09/29/2020   Procedure: INCISION AND DRAINAGE, RIGHT FOOT;  Surgeon: Samara Deist, DPM;  Location: [facility] ORS;  Service: Podiatry;  Laterality: Right;  . LOWER EXTREMITY ANGIOGRAPHY Right 10/03/2020   Procedure: Lower Extremity Angiography;  Surgeon: Katha Cabal, MD;  Location: [place] [facility];  Service: Cardiovascular;  Laterality: Right;  . TENDON LENGTHENING Right 10/11/2020   Procedure: TENDON LENGTHENING;  Surgeon: Caroline More, DPM;  Location: [facility] ORS;  Service: Podiatry;  Laterality: Right;     Medications Prior to Admission: Prior to Admission medications   Medication Sig Start Date End Date Taking? Authorizing Provider  acetaminophen (TYLENOL) 325 MG tablet Take 2  tablets (650 mg total) by mouth every 4 (four) hours as needed for mild pain, fever or headache. 10/13/20   Nicole Kindred A, DO  albuterol (VENTOLIN HFA) 108 (90 Base) MCG/ACT inhaler Inhale 2 puffs into the lungs every 6 (six) hours as needed for wheezing or shortness of breath. 10/30/20   Merlyn Lot, MD  amoxicillin-clavulanate (AUGMENTIN) 875-125 MG tablet Take 1 tablet by mouth  2 (two) times daily. 11/07/20   Tsosie Billing, MD  amoxicillin-clavulanate (AUGMENTIN) 875-125 MG tablet TAKE ONE TABLET BY MOUTH 2 TIMES A DAY 11/07/20 11/07/21  Tsosie Billing, MD  atorvastatin (LIPITOR) 40 MG tablet Take 1 tablet (40 mg total) by mouth daily. 12/06/15   Coral Spikes, DO  Blood Glucose Monitoring Suppl (RELION PRIME MONITOR) DEVI Use up to 4 times daily to check blood sugars. 12/06/15   Coral Spikes, DO  carvedilol (COREG) 3.125 MG tablet Take 1 tablet (3.125 mg total) by mouth 2 (two) times daily with a meal. 10/13/20   Nicole Kindred A, DO  carvedilol (COREG) 3.125 MG tablet TAKE ONE TABLET BY MOUTH 2 TIMES A DAY WITH MEALS 10/13/20 10/13/21  Nicole Kindred A, DO  fluticasone (FLONASE) 50 MCG/ACT nasal spray Place 2 sprays into both nostrils daily. 10/14/20   Ezekiel Slocumb, DO  fluticasone (FLONASE) 50 MCG/ACT nasal spray PLACE 2 SPRAYS INTO BOTH NOSTRILS EVERY DAY 10/13/20 10/13/21  Nicole Kindred A, DO  gabapentin (NEURONTIN) 300 MG capsule Take 2 capsules (600 mg total) by mouth at bedtime. 10/13/20   Ezekiel Slocumb, DO  gabapentin (NEURONTIN) 300 MG capsule TAKE ONE CAPSULE BY MOUTH EVERY MORNING 10/13/20 10/13/21  Nicole Kindred A, DO  gabapentin (NEURONTIN) 300 MG capsule TAKE 2 CAPSULES BY MOUTH AT BEDTIME 10/13/20 10/13/21  Nicole Kindred A, DO  glucose blood (RELION PRIME TEST) test strip Use as instructed 12/06/15   Coral Spikes, DO  Insulin Glargine (BASAGLAR KWIKPEN) 100 UNIT/ML Inject 30 Units into the skin daily. 10/13/20   Ezekiel Slocumb, DO  Insulin Glargine [facility] KWIKPEN) 100 UNIT/ML INJECT 30 UNITS INTO THE SKIN EVERY DAY 10/13/20 10/13/21  Nicole Kindred A, DO  insulin lispro (HUMALOG) 100 UNIT/ML KwikPen Inject 11 Units into the skin 3 (three) times daily with meals. 10/13/20   Nicole Kindred A, DO  insulin lispro (HUMALOG) 100 UNIT/ML KwikPen INJECT 11 UNITS INTO THE SKIN 3 TIMES A DAY WITH MEALS 10/13/20 10/13/21  Nicole Kindred A, DO   Insulin Pen Needle (PEN NEEDLES) 32G X 6 MM MISC 1 each by Does not apply route 4 (four) times daily -  before meals and at bedtime. 10/13/20   Nicole Kindred A, DO  Insulin Pen Needle 32G X 4 MM MISC USE AS DIRECTED 10/13/20 10/13/21  Nicole Kindred A, DO  lisinopril (PRINIVIL,ZESTRIL) 5 MG tablet TAKE ONE TABLET BY MOUTH ONCE DAILY 01/27/17   Coral Spikes, DO  meloxicam (MOBIC) 7.5 MG tablet Take 7.5 mg by mouth 2 (two) times daily. 09/10/20   [provider]  metFORMIN (GLUCOPHAGE) 500 MG tablet TAKE ONE TABLET BY MOUTH TWICE DAILY WITH A MEAL 01/27/17   Cook, Jayce G, DO  methocarbamol (ROBAXIN) 750 MG tablet Take 1 tablet (750 mg total) by mouth every 6 (six) hours as needed for muscle spasms. 10/13/20   Ezekiel Slocumb, DO  ReliOn Ultra Thin Lancets MISC Use as directed to take blood sugar. 12/06/15   Coral Spikes, DO  sildenafil (VIAGRA) 25 MG tablet Take 1 tablet by  mouth daily. 09/10/20   [provider]     Allergies:   No Known Allergies  Social History: He works as a Administrator.  He smokes cigarettes of approximately 1 pack/day.  He drinks alcohol once or twice a month.  He does not use cocaine or heroin. Social History   Socioeconomic History  . Marital status: Single    Spouse name: Not on file  . Number of children: Not on file  . Years of education: Not on file  . Highest education level: Not on file  Occupational History  . Not on file  Tobacco Use  . Smoking status: Current Every Day Smoker    Packs/day: 1.00    Types: Cigarettes  . Smokeless tobacco: Never Used  Substance and Sexual Activity  . Alcohol use: Yes    Alcohol/week: 0.0 - 1.0 standard drinks  . Drug use: No  . Sexual activity: Not on file  Other Topics Concern  . Not on file  Social History Narrative  . Not on file   Social Determinants of Health   Financial Resource Strain: Not on file  Food Insecurity: Not on file  Transportation Needs: Not on file  Physical Activity: Not  on file  Stress: Not on file  Social Connections: Not on file  Intimate Partner Violence: Not on file    Family History:   The patient's family history includes Breast cancer in his mother; Diabetes in his father; Heart disease in his father; Lung cancer in his paternal grandmother.    ROS:  Please see the history of present illness.  All other ROS reviewed and negative.     Physical Exam/Data:   Vitals:   01/19/21 2345 01/20/21 0000 01/20/21 0015 01/20/21 0030  BP: 105/83 117/80 111/76 107/77  Pulse: (!) 102 (!) 103 (!) 108 95  Resp: (!) 21 (!) 23 17 18   Temp: 98.5 F (36.9 C)     TempSrc: Oral     SpO2: 97% 97% 97% 97%   No intake or output data in the 24 hours ending 01/20/21 0108 Last 3 Weights 01/19/2021 11/02/2020 10/30/2020  Weight (lbs) 220 lb 220 lb 220 lb  Weight (kg) 99.791 kg 99.791 kg 99.791 kg     There is no height or weight on file to calculate BMI.  General:  Well nourished, well developed, in no acute distress HEENT: normal Lymph: no adenopathy Neck: no JVD Endocrine:  No thryomegaly Vascular: No carotid bruits; FA pulses 2+ bilaterally without bruits  Cardiac:  normal S1, S2; RRR; no murmur  Lungs:  clear to auscultation bilaterally, no wheezing, rhonchi or rales  Abd: soft, nontender, no hepatomegaly  Ext: Left leg warm and dry with no edema.  Right foot with healing transmetatarsal amputation with some wound separation on the lateral aspect, but no drainage, fluctuance, erythema, or frank dehiscence. Musculoskeletal:  No deformities, BUE and BLE strength normal and equal.  Full body back spasms witnessed during exam Skin: warm and dry. Right foot with healing transmetatarsal amputation with some wound separation on the lateral aspect, but no drainage, fluctuance, erythema, or frank dehiscence. Neuro:  CNs 2-12 intact, no focal abnormalities noted Psych:  Normal affect   EKG:  Post-cath ECG pending  Relevant CV Studies: Echo pending   Laboratory  Data:  High Sensitivity Troponin:   Recent Labs  Lab 01/19/21 2014  TROPONINIHS 2,012*      Chemistry Recent Labs  Lab 01/19/21 2014  NA 134*  K 3.8  CL 102  CO2 21*  GLUCOSE 392*  BUN 11  CREATININE 1.12  CALCIUM 8.3*  GFRNONAA >60  ANIONGAP 11    Recent Labs  Lab 01/19/21 2014  PROT 6.7  ALBUMIN 2.9*  AST 36  ALT 34  ALKPHOS 162*  BILITOT 0.9   Hematology Recent Labs  Lab 01/19/21 2014  WBC 11.8*  RBC 4.99  HGB 15.8  HCT 45.4  MCV 91.0  MCH 31.7  MCHC 34.8  RDW 12.7  PLT 210   Radiology/Studies:  CARDIAC CATHETERIZATION  Result Date: 01/19/2021  Mid RCA lesion is 100% stenosed- very late stent thrombosis. Aspiration thrombectomy was performed which restored TIMI-3 flow.  Balloon angioplasty was performed using a BALLOON [place] [id number].  Post intervention, there is a 0% residual stenosis.  Mid Cx lesion is 100% stenosed.  A drug-eluting stent was successfully placed using a STENT RESOLUTE ONYX 2.5X15, postdilated to greater than 3 mm.  Post intervention, there is a 0% residual stenosis.  LV end diastolic pressure is mildly elevated.  There is no aortic valve stenosis.  Temporary pacemaker placement was used. Heart rate improved after PCI of the RCA. The temporary pacer was removed.  Successful placement of the intra-aortic balloon pump.  Dual antiplatelet therapy for 12 months along with aggressive secondary prevention.  Very late stent thrombosis of the RCA likely due to his antiplatelet therapy being stopped back in February.  Second acute occlusion of the circumflex.  Transfer to [facility] given his intra-aortic balloon pump.     Assessment and Plan:   1. STEMI with cardiac arrest (likely arrhythmic) and cardiogenic shock  - IABP support one-to-one overnight, anticipate that he will be able to wean in the morning - Heparin for IABP - Echo in the morning - Aspirin 81 mg daily and ticagrelor 90 mg twice daily - Atorvastatin 40 mg  daily -Hold beta-blocker for now given shock presentation, and to start as soon as we are able to wean and remove the balloon pump -Tobacco cessation counseling - Monitor for VT, ICD not indicated this admission given reversible cause of presumed VT/VF arrest which led to his presentation   2. Type 2 Diabetes -Insulin glargine 30 units every morning -Short acting insulin 6 units with meals (half home dose) -Sliding scale insulin  3. S/p TMA -Consider wound care consult to assess for wound healing and recommend appropriate dressing  4. Back pain, back spasms -Gabapentin 600 mg at bedtime and 300 mg in the morning, Robaxin-750 milligrams every 6 hours as needed   Risk Assessment/Risk Scores:   TIMI Risk Score for ST  Elevation MI:   The patient's TIMI risk score is 6, which indicates a 16.1% risk of all cause mortality at 30 days. {  Severity of Illness: The appropriate patient status for this patient is INPATIENT. Inpatient status is judged to be reasonable and necessary in order to provide the required intensity of service to ensure the patient's safety. The patient's presenting symptoms, physical exam findings, and initial radiographic and laboratory data in the context of their chronic comorbidities is felt to place them at high risk for further clinical deterioration. Furthermore, it is not anticipated that the patient will be medically stable for discharge from the hospital within 2 midnights of admission. The following factors support the patient status of inpatient.   " The patient's presenting symptoms include cardiac arrest. " The worrisome physical exam findings include altered consciousness. " The initial radiographic and laboratory data are worrisome  because of coronary artery occlusion. " The chronic co-morbidities include diabetes with neuropathy, prior PCI.   * I certify that at the point of admission it is my clinical judgment that the patient will require inpatient  hospital care spanning beyond 2 midnights from the point of admission due to high intensity of service, high risk for further deterioration and high frequency of surveillance required.*    For questions or updates, please contact [place] Please consult [email or web address] for contact info under     Signed, Osvaldo Shipper, MD  01/20/2021 1:08 AM

## 2021-01-20 NOTE — Progress Notes (Addendum)
Progress Note  Patient Name: [name] Date of Encounter: 01/20/2021  [facility] Cardiologist: None   Subjective   Looks very comfortable. Denies angina and dyspnea. I turned IABP down to 2:1 a few hours ago - remains hemodynamically stable.  Inpatient Medications    Scheduled Meds: . [START ON 01/21/2021] aspirin EC  81 mg Oral Daily  . atorvastatin  40 mg Oral Daily  . gabapentin  300 mg Oral q morning  . gabapentin  600 mg Oral QHS  . insulin aspart  0-15 Units Subcutaneous TID WC  . insulin aspart  6 Units Subcutaneous TID WC  . insulin glargine  30 Units Subcutaneous Daily  . [START ON 01/21/2021] ticagrelor  90 mg Oral BID   Continuous Infusions: . heparin 1,000 Units/hr (01/20/21 0600)   PRN Meds: acetaminophen, methocarbamol, nitroGLYCERIN, ondansetron (ZOFRAN) IV, perflutren lipid microspheres (DEFINITY) IV suspension   Vital Signs    Vitals:   01/20/21 1030 01/20/21 1045 01/20/21 1100 01/20/21 1120  BP: (!) 74/58 110/61 (!) 134/98 (!) 101/59  Pulse: (!) 103  (!) 228 (!) 47  Resp: (!) 21 17 14 18   Temp:    98 F (36.7 C)  TempSrc:      SpO2: 97%  97% 98%    Intake/Output Summary (Last 24 hours) at 01/20/2021 1205 Last data filed at 01/20/2021 1030 Gross per 24 hour  Intake 39.97 ml  Output 900 ml  Net -860.03 ml   Last 3 Weights 01/19/2021 11/02/2020 10/30/2020  Weight (lbs) 220 lb 220 lb 220 lb  Weight (kg) 99.791 kg 99.791 kg 99.791 kg      Telemetry    NSR - Personally Reviewed  ECG    NSR, right axis, early RS transition and ST depression V2-V3 c/w posterior MI - Personally Reviewed  Physical Exam  Comfortable lying fully flat GEN: No acute distress.   Neck: No JVD Cardiac: RRR, no murmurs, rubs, or gallops. IABP sounds Respiratory: Clear to auscultation bilaterally. GI: Soft, nontender, non-distended  MS: No edema; IABP R femoral. S/P transmetatarsal amputation R foot, warm dorsum of foot, Doppler- able pulse. Neuro:  Nonfocal   Psych: Normal affect   Labs    High Sensitivity Troponin:   Recent Labs  Lab 01/19/21 2014  TROPONINIHS 2,012*      Chemistry Recent Labs  Lab 01/19/21 2014 01/20/21 0320  NA 134* 130*  K 3.8 4.0  CL 102 99  CO2 21* 18*  GLUCOSE 392* 450*  BUN 11 19  CREATININE 1.12 1.43*  CALCIUM 8.3* 8.1*  PROT 6.7  --   ALBUMIN 2.9*  --   AST 36  --   ALT 34  --   ALKPHOS 162*  --   BILITOT 0.9  --   GFRNONAA >60 60*  ANIONGAP 11 13     Hematology Recent Labs  Lab 01/19/21 2014 01/20/21 0320  WBC 11.8* 14.0*  RBC 4.99 4.75  HGB 15.8 14.9  HCT 45.4 42.9  MCV 91.0 90.3  MCH 31.7 31.4  MCHC 34.8 34.7  RDW 12.7 12.7  PLT 210 186    BNPNo results for input(s): BNP, PROBNP in the last 168 hours.   DDimer No results for input(s): DDIMER in the last 168 hours.   Radiology    CARDIAC CATHETERIZATION  Result Date: 01/19/2021  Mid RCA lesion is 100% stenosed- very late stent thrombosis. Aspiration thrombectomy was performed which restored TIMI-3 flow.  Balloon angioplasty was performed using a BALLOON [place] [employer] [id number].  Post intervention, there is a 0% residual stenosis.  Mid Cx lesion is 100% stenosed.  A drug-eluting stent was successfully placed using a STENT RESOLUTE ONYX 2.5X15, postdilated to greater than 3 mm.  Post intervention, there is a 0% residual stenosis.  LV end diastolic pressure is mildly elevated.  There is no aortic valve stenosis.  Temporary pacemaker placement was used. Heart rate improved after PCI of the RCA. The temporary pacer was removed.  Successful placement of the intra-aortic balloon pump.  Dual antiplatelet therapy for 12 months along with aggressive secondary prevention.  Very late stent thrombosis of the RCA likely due to his antiplatelet therapy being stopped back in February.  Second acute occlusion of the circumflex.  Transfer to [facility] given his intra-aortic balloon pump.    Cardiac Studies   CATH 01/19/2021   Mid RCA  lesion is 100% stenosed- very late stent thrombosis. Aspiration thrombectomy was performed which restored TIMI-3 flow.  Balloon angioplasty was performed using a BALLOON [place] [id number].  Post intervention, there is a 0% residual stenosis.  Mid Cx lesion is 100% stenosed.  A drug-eluting stent was successfully placed using a STENT RESOLUTE ONYX 2.5X15, postdilated to greater than 3 mm.  Post intervention, there is a 0% residual stenosis.  LV end diastolic pressure is mildly elevated.  There is no aortic valve stenosis.  Temporary pacemaker placement was used. Heart rate improved after PCI of the RCA. The temporary pacer was removed.  Successful placement of the intra-aortic balloon pump.   Dual antiplatelet therapy for 12 months along with aggressive secondary prevention.  Very late stent thrombosis of the RCA likely due to his antiplatelet therapy being stopped back in February.  Second acute occlusion of the circumflex.    Transfer to [facility] given his intra-aortic balloon pump.  Diagnostic Dominance: Right    Intervention     ECHO 01/20/2021  1. Left ventricular ejection fraction, by estimation, is 40%. The left  ventricle has moderately decreased function. The left ventricle  demonstrates regional wall motion abnormalities (see scoring  diagram/findings for description). Left ventricular  diastolic parameters are indeterminate. There is severe hypokinesis of the  left ventricular, entire inferior wall and inferolateral wall.  2. Right ventricular systolic function is mildly reduced. The right  ventricular size is normal. Tricuspid regurgitation signal is inadequate  for assessing PA pressure.  3. The mitral valve is normal in structure. No evidence of mitral valve  regurgitation.  4. The aortic valve is tricuspid. Aortic valve regurgitation is not  visualized. Mild aortic valve sclerosis is present, with no evidence of  aortic valve stenosis.  5. The  inferior vena cava is dilated in size with <50% respiratory  variability, suggesting right atrial pressure of 15 mmHg.   Patient Profile     50 y.o. male w CAD presenting w syncope due to high grade AV block and shock in the setting of acute inferoposterior STEMI, found to have simultaneous thrombotic occlusion of old RCA stent and of native LCX, treated with emergency PCI and IABP. Had transient atrial fibrillation.  Assessment & Plan    1. CAD s/p inf-post STEMI: unusual synchronous 2 vessel thrombotic occlusion. Occurred after 2 weeks off clopidogrel, but the RCA stent is 50 years old. Consider embolic event since he had transient atrial fibrillation, but he is young and LA not dilated.  Currently asymptomatic and hemodynamically stable. Per Dr. Irish Lack: "There appeared to be two acute vessel occlusions, the RCA  and the circumflex. The cath showed late stent thrombosis of his RCA stent.  Aspiration thrombectomy was performed followed by balloon angioplasty.  TIMI-3 flow was restored.  I elected not to stent the RCA given that the circumflex was still occluded and he remained hypotensive.  It was not practical to perform intravascular imaging at that point due to his condition so I was not sure if his stent was undersized and that was the mode of his very late stent thrombosis". On statin. 2. Cardiogenic shock: resolved. Will DC the IABP. 3. AFib: transient, maybe just AMI related. Will not restart IV heparin for now. However, considering the unusual presentation, he should have at least a 30 day rhythm monitor. For now, would avoid ASA+Brilinta+ DOAC. If he has recurrent AFib, would switch to clopidogrel+DOAC. 4. 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 5. LV dysfunction: no evidence of CHF clinically and no clear evidence for increased LA filling pressures on echo. IVC appears dilated. Start very low dose carvedilol (BP low normal), will wait on ACEi for now. 6. PAD: s/p R transmetatarsal  amputation. 7. DM: on insulin. Poorly controlled, A1c 11.2 in February. 8. HLP: supposed to be on statin; LDL 136 (was 67 in February) suggests nonompliance     CRITICAL CARE Performed by: Dani Gobble Peyson Delao   Total critical care time: 45 minutes  Critical care time was exclusive of separately billable procedures and treating other patients.  Critical care was necessary to treat or prevent imminent or life-threatening deterioration.  Critical care was time spent personally by me on the following activities: development of treatment plan with patient and/or surrogate as well as nursing, discussions with consultants, evaluation of patient's response to treatment, examination of patient, obtaining history from patient or surrogate, ordering and performing treatments and interventions, ordering and review of laboratory studies, ordering and review of radiographic studies, pulse oximetry and re-evaluation of patient's condition.   For questions or updates, please contact [place] Please consult [email or web address] for contact info under        Signed, Sanda Klein, MD  01/20/2021, 12:05 PM

## 2021-01-20 NOTE — Progress Notes (Signed)
IABP present in right groin, no waveform on bedside monitor from arterial port. Arterial port would not aspirate.   IABP removed, manual pressure applied for 30 minutes. Site level 0, no s+s of hematoma. Tegaderm dressing applied, bedrest instructions given.   Bilateral dp and pt pulses present with doppler.    Bedrest begins at 16:15:00

## 2021-01-20 NOTE — Progress Notes (Addendum)
ANTICOAGULATION CONSULT NOTE - Initial Consult  Pharmacy Consult for Heparin  Indication: IABP, afib  No Known Allergies    Vital Signs: Temp: 98.5 F (36.9 C) (06/03 2345) Temp Source: Oral (06/03 2345) BP: 96/73 (06/04 0115) Pulse Rate: 94 (06/04 0115)  Labs: Recent Labs    01/19/21 2014  HGB 15.8  HCT 45.4  PLT 210  APTT 30  LABPROT 12.9  INR 1.0  CREATININE 1.12  TROPONINIHS 2,012*    Estimated Creatinine Clearance: 99.6 mL/min (by C-G formula based on SCr of 1.12 mg/dL).   Medical History: Past Medical History:  Diagnosis Date  . Diabetes mellitus without complication ([place])   . Heart disease   . Myocardial infarction [facility])      Assessment: 50 y/o M transfer from [facility] s/p cath with IABP in place. Heparin infusing at 1000 units/hr. CBC/renal function good. Also noted to be in afib during procedure.   Goal of Therapy:  Heparin level 0.3-0.5 units/ml Monitor platelets by anticoagulation protocol: Yes   Plan:  Cont heparin at 1000 units/hr 0800 heparin level  Narda Bonds, PharmD, BCPS Clinical Pharmacist Phone: [phone]

## 2021-01-20 NOTE — Progress Notes (Signed)
Echocardiogram 2D Echocardiogram has been performed.  [name] 01/20/2021, 11:02 AM

## 2021-01-21 ENCOUNTER — Inpatient Hospital Stay (HOSPITAL_COMMUNITY): Payer: Medicaid Other

## 2021-01-21 ENCOUNTER — Other Ambulatory Visit: Payer: Self-pay

## 2021-01-21 ENCOUNTER — Encounter (HOSPITAL_COMMUNITY): Payer: Self-pay | Admitting: Cardiology

## 2021-01-21 DIAGNOSIS — I739 Peripheral vascular disease, unspecified: Secondary | ICD-10-CM

## 2021-01-21 DIAGNOSIS — E1165 Type 2 diabetes mellitus with hyperglycemia: Secondary | ICD-10-CM

## 2021-01-21 DIAGNOSIS — E785 Hyperlipidemia, unspecified: Secondary | ICD-10-CM

## 2021-01-21 DIAGNOSIS — N179 Acute kidney failure, unspecified: Secondary | ICD-10-CM

## 2021-01-21 DIAGNOSIS — I519 Heart disease, unspecified: Secondary | ICD-10-CM

## 2021-01-21 LAB — CBC
HCT: 39.1 % (ref 39.0–52.0)
Hemoglobin: 13.3 g/dL (ref 13.0–17.0)
MCH: 31.2 pg (ref 26.0–34.0)
MCHC: 34 g/dL (ref 30.0–36.0)
MCV: 91.8 fL (ref 80.0–100.0)
Platelets: 144 10*3/uL — ABNORMAL LOW (ref 150–400)
RBC: 4.26 MIL/uL (ref 4.22–5.81)
RDW: 12.9 % (ref 11.5–15.5)
WBC: 11 10*3/uL — ABNORMAL HIGH (ref 4.0–10.5)
nRBC: 0 % (ref 0.0–0.2)

## 2021-01-21 LAB — BASIC METABOLIC PANEL
Anion gap: 6 (ref 5–15)
BUN: 22 mg/dL — ABNORMAL HIGH (ref 6–20)
CO2: 24 mmol/L (ref 22–32)
Calcium: 8.3 mg/dL — ABNORMAL LOW (ref 8.9–10.3)
Chloride: 102 mmol/L (ref 98–111)
Creatinine, Ser: 1.42 mg/dL — ABNORMAL HIGH (ref 0.61–1.24)
GFR, Estimated: >60 mL/min (ref >60)
Glucose, Bld: 215 mg/dL — ABNORMAL HIGH (ref 70–99)
Potassium: 3.8 mmol/L (ref 3.5–5.1)
Sodium: 132 mmol/L — ABNORMAL LOW (ref 135–145)

## 2021-01-21 LAB — POCT ACTIVATED CLOTTING TIME: Activated Clotting Time: 142 seconds

## 2021-01-21 LAB — GLUCOSE, CAPILLARY
Glucose-Capillary: 188 mg/dL — ABNORMAL HIGH (ref 70–99)
Glucose-Capillary: 242 mg/dL — ABNORMAL HIGH (ref 70–99)
Glucose-Capillary: 232 mg/dL — ABNORMAL HIGH (ref 70–99)
Glucose-Capillary: 162 mg/dL — ABNORMAL HIGH (ref 70–99)

## 2021-01-21 MED ORDER — LORAZEPAM 2 MG/ML IJ SOLN
2.0000 mg | INTRAMUSCULAR | Status: AC
  Administered 2021-01-21: 2 mg via INTRAVENOUS

## 2021-01-21 MED ORDER — LORAZEPAM 2 MG/ML IJ SOLN
1.0000 mg | INTRAMUSCULAR | Status: DC
  Filled 2021-01-21: qty 1

## 2021-01-21 MED ORDER — LORAZEPAM 2 MG/ML IJ SOLN
2.0000 mg | Freq: Once | INTRAMUSCULAR | Status: AC | PRN
  Administered 2021-01-22: 2 mg via INTRAVENOUS
  Filled 2021-01-21: qty 1

## 2021-01-21 NOTE — Plan of Care (Signed)
  Problem: Clinical Measurements: Goal: Ability to maintain clinical measurements within normal limits will improve Outcome: Progressing Goal: Diagnostic test results will improve Outcome: Progressing Goal: Respiratory complications will improve Outcome: Progressing Goal: Cardiovascular complication will be avoided Outcome: Progressing   Problem: Activity: Goal: Risk for activity intolerance will decrease Outcome: Progressing   Problem: Nutrition: Goal: Adequate nutrition will be maintained Outcome: Progressing   Problem: Pain Managment: Goal: General experience of comfort will improve Outcome: Progressing

## 2021-01-21 NOTE — Progress Notes (Signed)
Progress Note  Patient Name: [name] Date of Encounter: 01/21/2021  [facility] Cardiologist: None   Subjective   No angina.  No dyspnea.  Right groin balloon pump site healing well. He complains of weak grip in both hands.  He is unable to fully flex his fingers and make a fist.  He dropped a cup of coffee this morning due to weak grip.  He also complains of reduced sensation in his left lower extremity.  Reports a history of carpal tunnel syndrome, but this is much worse than before. Has had difficulty urinating, including needing an In-N-Out cath.  This is a new problem for him.  Inpatient Medications    Scheduled Meds: . aspirin EC  81 mg Oral Daily  . atorvastatin  40 mg Oral Daily  . carvedilol  3.125 mg Oral BID WC  . Chlorhexidine Gluconate Cloth  6 each Topical Daily  . gabapentin  300 mg Oral q morning  . gabapentin  600 mg Oral QHS  . insulin aspart  0-15 Units Subcutaneous TID WC  . insulin aspart  6 Units Subcutaneous TID WC  . insulin glargine  30 Units Subcutaneous Daily  . ticagrelor  90 mg Oral BID   Continuous Infusions:  PRN Meds: acetaminophen, methocarbamol, nitroGLYCERIN, ondansetron (ZOFRAN) IV   Vital Signs    Vitals:   01/21/21 0700 01/21/21 0800 01/21/21 0900 01/21/21 1000  BP: 109/71 113/80 98/81 95/73   Pulse: 87 88 89 87  Resp: 15 16 17 20   Temp: (!) 96.4 F (35.8 C)     TempSrc: Axillary     SpO2: 96% 96% 97% 96%    Intake/Output Summary (Last 24 hours) at 01/21/2021 1010 Last data filed at 01/21/2021 0900 Gross per 24 hour  Intake 840 ml  Output 1500 ml  Net -660 ml   Last 3 Weights 01/19/2021 11/02/2020 10/30/2020  Weight (lbs) 220 lb 220 lb 220 lb  Weight (kg) 99.791 kg 99.791 kg 99.791 kg      Telemetry    Sinus rhythm, rare PACs- Personally Reviewed  ECG    Shows sinus rhythm with one PAC, with evolving inferoposterior infarction pattern, still with some subtle ST segment depression in V1-V3.  Marginally prolonged  QT 473 ms- Personally Reviewed  Physical Exam  Appears comfortable lying fully flat in bed GEN: No acute distress.   Neck: No JVD Cardiac: RRR, no murmurs, rubs, or gallops.  Respiratory: Clear to auscultation bilaterally. GI: Soft, nontender, non-distended  MS: No edema; right foot transmetatarsal amputation.  Right groin without hematoma or bleeding. Neuro:   PERRL/EOMI, nonfocal cranial nerve exam, symmetrical face and expression, grip strength 4/5 bilaterally, slightly stronger in dominant right hand Psych: Normal affect   Labs    High Sensitivity Troponin:   Recent Labs  Lab 01/19/21 2014  TROPONINIHS 2,012*      Chemistry Recent Labs  Lab 01/19/21 2014 01/20/21 0320 01/21/21 0140  NA 134* 130* 132*  K 3.8 4.0 3.8  CL 102 99 102  CO2 21* 18* 24  GLUCOSE 392* 450* 215*  BUN 11 19 22*  CREATININE 1.12 1.43* 1.42*  CALCIUM 8.3* 8.1* 8.3*  PROT 6.7  --   --   ALBUMIN 2.9*  --   --   AST 36  --   --   ALT 34  --   --   ALKPHOS 162*  --   --   BILITOT 0.9  --   --   GFRNONAA >60  60* >60  ANIONGAP 11 13 6      Hematology Recent Labs  Lab 01/19/21 2014 01/20/21 0320 01/21/21 0140  WBC 11.8* 14.0* 11.0*  RBC 4.99 4.75 4.26  HGB 15.8 14.9 13.3  HCT 45.4 42.9 39.1  MCV 91.0 90.3 91.8  MCH 31.7 31.4 31.2  MCHC 34.8 34.7 34.0  RDW 12.7 12.7 12.9  PLT 210 186 144*    BNPNo results for input(s): BNP, PROBNP in the last 168 hours.   DDimer No results for input(s): DDIMER in the last 168 hours.   Radiology    CARDIAC CATHETERIZATION  Result Date: 01/19/2021  Mid RCA lesion is 100% stenosed- very late stent thrombosis. Aspiration thrombectomy was performed which restored TIMI-3 flow.  Balloon angioplasty was performed using a BALLOON [place] [id number].  Post intervention, there is a 0% residual stenosis.  Mid Cx lesion is 100% stenosed.  A drug-eluting stent was successfully placed using a STENT RESOLUTE ONYX 2.5X15, postdilated to greater than 3 mm.   Post intervention, there is a 0% residual stenosis.  LV end diastolic pressure is mildly elevated.  There is no aortic valve stenosis.  Temporary pacemaker placement was used. Heart rate improved after PCI of the RCA. The temporary pacer was removed.  Successful placement of the intra-aortic balloon pump.  Dual antiplatelet therapy for 12 months along with aggressive secondary prevention.  Very late stent thrombosis of the RCA likely due to his antiplatelet therapy being stopped back in February.  Second acute occlusion of the circumflex.  Transfer to [facility] given his intra-aortic balloon pump.   ECHOCARDIOGRAM COMPLETE  Result Date: 01/20/2021    ECHOCARDIOGRAM REPORT   Patient Name:   [name] Date of Exam: 01/20/2021 Medical Rec #:  [id number]       Height:       74.0 in Accession #:    [id number]      Weight:       220.0 lb Date of Birth:  November 10, 1970       BSA:          2.263 m Patient Age:    50 years        BP:           104/74 mmHg Patient Gender: M               HR:           87 bpm. Exam Location:  Inpatient Procedure: 2D Echo, Color Doppler, Cardiac Doppler and Intracardiac            Opacification Agent Indications:    Acute MI i21.9  History:        Patient has no prior history of Echocardiogram examinations.                 CAD; Risk Factors:Hypertension, Diabetes and Dyslipidemia.  Sonographer:    Raquel Sarna Senior RDCS Referring Phys: [id number] KELLY E ARPS  Sonographer Comments: Positioning limited by balloon pump. IMPRESSIONS  1. Left ventricular ejection fraction, by estimation, is 40%. The left ventricle has moderately decreased function. The left ventricle demonstrates regional wall motion abnormalities (see scoring diagram/findings for description). Left ventricular diastolic parameters are indeterminate. There is severe hypokinesis of the left ventricular, entire inferior wall and inferolateral wall.  2. Right ventricular systolic function is mildly reduced. The right ventricular size  is normal. Tricuspid regurgitation signal is inadequate for assessing PA pressure.  3. The mitral valve is normal in structure. No evidence of  mitral valve regurgitation.  4. The aortic valve is tricuspid. Aortic valve regurgitation is not visualized. Mild aortic valve sclerosis is present, with no evidence of aortic valve stenosis.  5. The inferior vena cava is dilated in size with <50% respiratory variability, suggesting right atrial pressure of 15 mmHg. FINDINGS  Left Ventricle: Left ventricular ejection fraction, by estimation, is 40%. The left ventricle has moderately decreased function. The left ventricle demonstrates regional wall motion abnormalities. Severe hypokinesis of the left ventricular, entire inferior wall and inferolateral wall. Definity contrast agent was given IV to delineate the left ventricular endocardial borders. The left ventricular internal cavity size was normal in size. There is no left ventricular hypertrophy. Left ventricular diastolic parameters are indeterminate. Indeterminate filling pressures. Right Ventricle: The right ventricular size is normal. No increase in right ventricular wall thickness. Right ventricular systolic function is mildly reduced. Tricuspid regurgitation signal is inadequate for assessing PA pressure. Left Atrium: Left atrial size was normal in size. Right Atrium: Right atrial size was normal in size. Pericardium: There is no evidence of pericardial effusion. Mitral Valve: The mitral valve is normal in structure. No evidence of mitral valve regurgitation. Tricuspid Valve: The tricuspid valve is normal in structure. Tricuspid valve regurgitation is not demonstrated. Aortic Valve: The aortic valve is tricuspid. Aortic valve regurgitation is not visualized. Mild aortic valve sclerosis is present, with no evidence of aortic valve stenosis. Pulmonic Valve: The pulmonic valve was grossly normal. Pulmonic valve regurgitation is not visualized. Aorta: The aortic root and  ascending aorta are structurally normal, with no evidence of dilitation. Venous: The inferior vena cava is dilated in size with less than 50% respiratory variability, suggesting right atrial pressure of 15 mmHg. IAS/Shunts: No atrial level shunt detected by color flow Doppler.  LEFT VENTRICLE PLAX 2D LVIDd:         5.10 cm  Diastology LVIDs:         4.10 cm  LV e' medial:    5.22 cm/s LV PW:         1.10 cm  LV E/e' medial:  9.5 LV IVS:        1.00 cm  LV e' lateral:   3.92 cm/s LVOT diam:     2.00 cm  LV E/e' lateral: 12.7 LV SV:         30 LV SV Index:   13 LVOT Area:     3.14 cm  RIGHT VENTRICLE RV S prime:     6.96 cm/s TAPSE (M-mode): 1.0 cm LEFT ATRIUM             Index       RIGHT ATRIUM           Index LA diam:        3.10 cm 1.37 cm/m  RA Area:     14.60 cm LA Vol (A2C):   53.6 ml 23.68 ml/m RA Volume:   44.30 ml  19.57 ml/m LA Vol (A4C):   42.7 ml 18.87 ml/m LA Biplane Vol: 49.4 ml 21.83 ml/m  AORTIC VALVE LVOT Vmax:   57.40 cm/s LVOT Vmean:  42.500 cm/s LVOT VTI:    0.094 m  AORTA Ao Root diam: 3.10 cm Ao Asc diam:  3.20 cm MITRAL VALVE MV Area (PHT): 4.44 cm    SHUNTS MV Decel Time: 171 msec    Systemic VTI:  0.09 m MV E velocity: 49.70 cm/s  Systemic Diam: 2.00 cm MV A velocity: 45.80 cm/s MV E/A ratio:  1.09 Rishikesh Khachatryan  Dondre Catalfamo MD Electronically signed by Sanda Klein MD Signature Date/Time: 01/20/2021/12:14:44 PM    Final     Cardiac Studies   Echocardiogram 01/20/2021  1. Left ventricular ejection fraction, by estimation, is 40%. The left  ventricle has moderately decreased function. The left ventricle  demonstrates regional wall motion abnormalities (see scoring  diagram/findings for description). Left ventricular  diastolic parameters are indeterminate. There is severe hypokinesis of the  left ventricular, entire inferior wall and inferolateral wall.  2. Right ventricular systolic function is mildly reduced. The right  ventricular size is normal. Tricuspid regurgitation signal is  inadequate  for assessing PA pressure.  3. The mitral valve is normal in structure. No evidence of mitral valve  regurgitation.  4. The aortic valve is tricuspid. Aortic valve regurgitation is not  visualized. Mild aortic valve sclerosis is present, with no evidence of  aortic valve stenosis.  5. The inferior vena cava is dilated in size with <50% respiratory  variability, suggesting right atrial pressure of 15 mmHg.    CATH 01/19/2021   Mid RCA lesion is 100% stenosed- very late stent thrombosis. Aspiration thrombectomy was performed which restored TIMI-3 flow.  Balloon angioplasty was performed using a BALLOON [place] [id number].  Post intervention, there is a 0% residual stenosis.  Mid Cx lesion is 100% stenosed.  A drug-eluting stent was successfully placed using a STENT RESOLUTE ONYX 2.5X15, postdilated to greater than 3 mm.  Post intervention, there is a 0% residual stenosis.  LV end diastolic pressure is mildly elevated.  There is no aortic valve stenosis.  Temporary pacemaker placement was used. Heart rate improved after PCI of the RCA. The temporary pacer was removed.  Successful placement of the intra-aortic balloon pump.  Dual antiplatelet therapy for 12 months along with aggressive secondary prevention. Very late stent thrombosis of the RCA likely due to his antiplatelet therapy being stopped back in February. Second acute occlusion of the circumflex.   Transfer to [facility] given his intra-aortic balloon pump.  Diagnostic Dominance: Right    Intervention      Patient Profile     50 y.o. male with known CAD and remote stents to RCA, presenting with syncope due to high-grade AV block in the setting of acute inferoposterior STEMI due to simultaneous thrombotic occlusion of old RCA stent and native left circumflex coronary artery, treated with emergency PCI and transient intra-aortic balloon pump support cardiac  catheterization.  Assessment & Plan    1. CAD s/p inf-post STEMI: unusual synchronous 2 vessel thrombotic occlusion. Occurred after 2 weeks off clopidogrel, but the RCA stent is 50 years old. Consider embolic event since he had transient atrial fibrillation, but he is young and LA not dilated.  Currently asymptomatic and hemodynamically stable. Per Dr. Irish Lack: "There appeared to be two acute vessel occlusions, the RCA and the circumflex.The cath showed late stent thrombosis of his RCA stent. Aspiration thrombectomy was performed followed by balloon angioplasty. TIMI-3 flow was restored. I elected not to stent the RCA given that the circumflex was still occluded and he remained hypotensive. It was not practical to perform intravascular imaging at that point due to his condition so I was not sure if his stent was undersized and that was the mode of his very late stent thrombosis". On statin. 2. Cardiogenic shock:  Resolved.  No problems with removal of balloon pump yesterday. 3. AFib: transient, maybe just AMI related.  No recurrence since he came to [facility].  Will not restart  IV heparin or oral anticoagulant for now. However, considering the unusual presentation, he should have at least a 30 day rhythm monitor. For now, would avoid ASA+Brilinta+ DOAC. If he has recurrent AFib, would switch to clopidogrel+DOAC. 4. 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  No indication for pacemaker. 5. LV dysfunction: no evidence of CHF clinically and no clear evidence for increased LA filling pressures on echo.  Remains abnormal with creatinine of 1.4 unchanged from yesterday (baseline around 0.9) we will hold off RAAS inhibitors for 1 more day 6. AKI: May have some degree of renal injury from transient hypotension during his syncopal event and/or contrast nephrotoxicity.  Check daily.  Hold off RAAS inhibitors for now. 7. PAD: s/p R transmetatarsal amputation. 8. HLP: supposed to be on statin; LDL  136 (was 67 in February) suggests noncompliance. 9. Bilateral hand weakness: Reportedly bears a history of carpal tunnel syndrome, but his grip is much weaker ever since he has been here.  Also complains of numbness in his left lower extremity and has difficulty maintaining balance.  Has difficulty urinating.  Consider possibility of spinal cord contusion when he lost consciousness.  We will request neurological evaluation. 10. DM: on insulin. Poorly controlled, A1c 11.2 in February.         For questions or updates, please contact [place] Please consult [email or web address] for contact info under        Signed, Sanda Klein, MD  01/21/2021, 10:10 AM

## 2021-01-21 NOTE — Progress Notes (Signed)
EKG CRITICAL VALUE     12 lead EKG performed.  Critical value noted.  Claiborne Billings, RN notified.   Warren Lacy, CCT 01/21/2021 7:55 AM

## 2021-01-21 NOTE — Consult Note (Addendum)
Neurology Consultation  Reason for Consult: Bilateral hand weakness / decreased grip strength and reduced sensation of left lower extremity Referring Physician: Dr. Sallyanne Kuster  CC: Weakness and tingling of bilateral hands, decreased sensation of left lower extremity  History is obtained from: Chart review, Patient  HPI: [name] is a 50 y.o. male with a medical history significant for poorly controlled type 2 diabetes mellitus (A1c 09/28/2020 11.2%), coronary artery disease with RCA PCI approximately 3 years ago, hypertension, hyperlipidemia, and cirrhosis who presented to [facility] 01/19/2021 for evaluation of a syncopal event with ST elevation on EKG identified by EMS. He was emergently transferred to [facility] for cardiac catheterization and was found to have a late in-stent thrombosis of the RCA and acute occlusion of the left circumflex. During the catheterization, he developed atrial fibrillation with RVR and he also became hypotensive requiring an IABP. He was placed on Brilinta and aspirin due to the transient nature of his atrial fibrillation with consideration for DOAC if atrial fibrillation recurs. This morning, [name] complained of decreased grip strength bilaterally, tingling of bilateral hands, dropping objects due to hand weakness, and decreased sensation to his left lower extremity so neurology was consulted for further evaluation and management of acute weakness. He states that he noticed the hand weakness and decreased sensation to his left lower extremity after his cardiac catheterization that was not present before hospitalization.  He has also been having significant urinary retention but no saddle anesthesia per his report  ROS: A complete ROS was performed and is negative except as noted in the HPI.   Past Medical History:  Diagnosis Date  . Coronary artery disease   . Diabetes mellitus without complication ([place])   . Heart disease   . Myocardial infarction [facility])    Past  Surgical History:  Procedure Laterality Date  . AMPUTATION Right 10/01/2020   Procedure: AMPUTATION 5th  RAY AND IRRIGATION AND DEBRIDEMENT;  Surgeon: Samara Deist, DPM;  Location: [facility] ORS;  Service: Podiatry;  Laterality: Right;  . AMPUTATION Right 10/11/2020   Procedure: AMPUTATION RAY;  Surgeon: Caroline More, DPM;  Location: [facility] ORS;  Service: Podiatry;  Laterality: Right;  . BACK SURGERY    . INCISION AND DRAINAGE Right 09/29/2020   Procedure: INCISION AND DRAINAGE, RIGHT FOOT;  Surgeon: Samara Deist, DPM;  Location: [facility] ORS;  Service: Podiatry;  Laterality: Right;  . LOWER EXTREMITY ANGIOGRAPHY Right 10/03/2020   Procedure: Lower Extremity Angiography;  Surgeon: Katha Cabal, MD;  Location: [facility];  Service: Cardiovascular;  Laterality: Right;  . TENDON LENGTHENING Right 10/11/2020   Procedure: TENDON LENGTHENING;  Surgeon: Caroline More, DPM;  Location: [facility] ORS;  Service: Podiatry;  Laterality: Right;   Family History  Problem Relation Age of Onset  . Diabetes Father   . Heart disease Father   . Breast cancer Mother   . Lung cancer Paternal Grandmother    Social History:   reports that he has been smoking cigarettes. He has been smoking about 1.00 pack per day. He has never used smokeless tobacco. He reports current alcohol use. He reports that he does not use drugs.  Medications  Current Facility-Administered Medications:  .  acetaminophen (TYLENOL) tablet 650 mg, 650 mg, Oral, Q4H PRN, Arps, Donald Siva, MD, 650 mg at 01/20/21 2337 .  aspirin EC tablet 81 mg, 81 mg, Oral, Daily, Arps, Donald Siva, MD, 81 mg at 01/21/21 0931 .  atorvastatin (LIPITOR) tablet 40 mg, 40 mg, Oral, Daily, Arps, Donald Siva,  MD, 40 mg at 01/21/21 0931 .  carvedilol (COREG) tablet 3.125 mg, 3.125 mg, Oral, BID WC, Croitoru, Mihai, MD, 3.125 mg at 01/21/21 0747 .  Chlorhexidine Gluconate Cloth 2 % PADS 6 each, 6 each, Topical, Daily, Arps, Donald Siva, MD, 6 each at 01/21/21 0931 .  gabapentin  (NEURONTIN) capsule 300 mg, 300 mg, Oral, q morning, Arps, Donald Siva, MD, 300 mg at 01/21/21 0931 .  gabapentin (NEURONTIN) capsule 600 mg, 600 mg, Oral, QHS, Arps, Donald Siva, MD, 600 mg at 01/20/21 2104 .  insulin aspart (novoLOG) injection 0-15 Units, 0-15 Units, Subcutaneous, TID WC, Arps, Donald Siva, MD, 5 Units at 01/21/21 1215 .  insulin aspart (novoLOG) injection 6 Units, 6 Units, Subcutaneous, TID WC, Arps, Donald Siva, MD, 6 Units at 01/21/21 1215 .  insulin glargine (LANTUS) injection 30 Units, 30 Units, Subcutaneous, Daily, Arps, Donald Siva, MD, 30 Units at 01/21/21 0931 .  methocarbamol (ROBAXIN) tablet 750 mg, 750 mg, Oral, Q6H PRN, Arps, Donald Siva, MD, 750 mg at 01/21/21 0724 .  nitroGLYCERIN (NITROSTAT) SL tablet 0.4 mg, 0.4 mg, Sublingual, Q5 Min x 3 PRN, Arps, Donald Siva, MD .  ondansetron (ZOFRAN) injection 4 mg, 4 mg, Intravenous, Q6H PRN, Arps, Donald Siva, MD .  ticagrelor (BRILINTA) tablet 90 mg, 90 mg, Oral, BID, Arps, Donald Siva, MD, 90 mg at 01/21/21 0931  Current Meds  Medication Sig  . acetaminophen (TYLENOL) 325 MG tablet Take 2 tablets (650 mg total) by mouth every 4 (four) hours as needed for mild pain, fever or headache.  . albuterol (VENTOLIN HFA) 108 (90 Base) MCG/ACT inhaler Inhale 2 puffs into the lungs every 6 (six) hours as needed for wheezing or shortness of breath.  Marland Kitchen amoxicillin-clavulanate (AUGMENTIN) 875-125 MG tablet Take 1 tablet by mouth 2 (two) times daily.  Marland Kitchen atorvastatin (LIPITOR) 40 MG tablet Take 1 tablet (40 mg total) by mouth daily.  . carvedilol (COREG) 3.125 MG tablet Take 1 tablet (3.125 mg total) by mouth 2 (two) times daily with a meal.  . fluticasone (FLONASE) 50 MCG/ACT nasal spray Place 2 sprays into both nostrils daily.  Marland Kitchen gabapentin (NEURONTIN) 300 MG capsule Take 2 capsules (600 mg total) by mouth at bedtime. (Patient taking differently: Take 300-600 mg by mouth See admin instructions. Take 331m in the morning and 6074mat bedtime.)  . glipiZIDE (GLUCOTROL) 10  MG tablet Take 10 mg by mouth daily.  . meloxicam (MOBIC) 7.5 MG tablet Take 7.5 mg by mouth 2 (two) times daily.  . methocarbamol (ROBAXIN) 750 MG tablet Take 1 tablet (750 mg total) by mouth every 6 (six) hours as needed for muscle spasms.  . sildenafil (VIAGRA) 25 MG tablet Take 1 tablet by mouth once as needed for erectile dysfunction.     Exam: Current vital signs: BP 102/68   Pulse 90   Temp 98.5 F (36.9 C)   Resp (!) 27   SpO2 95%  Vital signs in last 24 hours: Temp:  [96.4 F (35.8 C)-99.7 F (37.6 C)] 98.5 F (36.9 C) (06/05 1100) Pulse Rate:  [48-212] 90 (06/05 1300) Resp:  [9-27] 27 (06/05 1300) BP: (71-113)/(49-81) 102/68 (06/05 1300) SpO2:  [95 %-99 %] 95 % (06/05 1300)  GENERAL: Awake, alert, in no acute distress Psych: Affect appropriate for situation, patient calm and cooperative with examination Head: Normocephalic and atraumatic EENT: Edentulous, normal conjunctivae, dry mucous membranes LUNGS: Increased respiratory rate. Non-labored breathing CV: Regular rate on tele ABDOMEN: Soft, non-tender Ext: Right lower extremity s/p  transmetatarsal amputation, 1+ edema of bilateral lower extremities  NEURO:  Mental Status: Awake, alert, and oriented to age, month, year, place, location, and situation. He is able to provide a clear and coherent history of present events.  Speech/Language: speech is intact with mild dysarthria due to edentulous state at baseline; patient reports that his speech is at baseline. Naming, repetition, fluency, and comprehension intact. No aphasia or neglect is noted. Cranial Nerves:  II: PERRL 3 mm/brisk. Visual fields full.  III, IV, VI: EOMI without ptosis V: Sensation is intact to light touch and symmetrical to face.  VII: Face is symmetric resting and smiling. VIII: Hearing is intact to voice IX, X: Palate elevation is symmetric. Phonation normal.  XI: Normal sternocleidomastoid and trapezius muscle strength XII: Tongue protrudes  midline without fasciculations.   Motor: 5/5 strength present in bilateral lower extremities Right upper extremity 4-/5 deltoid, 3/5 biceps, 4/5 triceps, 3/5 grip strength Left upper extremity 5/5 deltoid, 4/5 biceps, 5/5 triceps, 4-/5 grip strength Lower extremities notable for bilateral hip flexion weakness, left greater than right (not fully antigravity), 4+/5 knee flexion on the left, otherwise right knee flexion and left and right knee extension all 5/5 Tone is notable for paratonia more than spasticity in the left upper extremity in particular (more stiffness with slower movements) though cannot exclude some underlying spasticity as well. Bulk is normal.  Sensation: Decreased sensation to light touch from the mid left abdomen distally throughout the left lower extremity. Right lower extremity with increased sensation to cool temperature compared to the left.  Bilateral hand tingling noted but with equal and intact sensation to light touch proximal to the wrist. On attending evaluation the sensory exam is notable for a length dependent neuropathy however his reports are otherwise unreliable Coordination: FTN intact bilaterally.  DTRs: 2+ and symmetric patellae, brachioradialis, and biceps. --On attending evaluation reflexes are 3+ in the biceps, brachioradialis, positive Hoffmann's bilaterally, patella is 3+ on the right and 2+ on the left Gait: deferred  Labs I have reviewed labs in epic and the results pertinent to this consultation are: CBC    Component Value Date/Time   WBC 11.0 (H) 01/21/2021 0140   RBC 4.26 01/21/2021 0140   HGB 13.3 01/21/2021 0140   HCT 39.1 01/21/2021 0140   PLT 144 (L) 01/21/2021 0140   MCV 91.8 01/21/2021 0140   MCH 31.2 01/21/2021 0140   MCHC 34.0 01/21/2021 0140   RDW 12.9 01/21/2021 0140   LYMPHSABS 3.7 01/19/2021 2014   MONOABS 0.5 01/19/2021 2014   EOSABS 0.2 01/19/2021 2014   BASOSABS 0.0 01/19/2021 2014   CMP     Component Value Date/Time    NA 132 (L) 01/21/2021 0140   K 3.8 01/21/2021 0140   CL 102 01/21/2021 0140   CO2 24 01/21/2021 0140   GLUCOSE 215 (H) 01/21/2021 0140   BUN 22 (H) 01/21/2021 0140   CREATININE 1.42 (H) 01/21/2021 0140   CREATININE 0.98 04/13/2015 1440   CALCIUM 8.3 (L) 01/21/2021 0140   PROT 6.7 01/19/2021 2014   ALBUMIN 2.9 (L) 01/19/2021 2014   AST 36 01/19/2021 2014   ALT 34 01/19/2021 2014   ALKPHOS 162 (H) 01/19/2021 2014   BILITOT 0.9 01/19/2021 2014   GFRNONAA >60 01/21/2021 0140   GFRAA >60 01/25/2017 1755   Lipid Panel     Component Value Date/Time   CHOL 202 (H) 01/20/2021 0320   TRIG 120 01/20/2021 0320   HDL 42 01/20/2021 0320   CHOLHDL 4.8 01/20/2021  0320   VLDL 24 01/20/2021 0320   LDLCALC 136 (H) 01/20/2021 0320   LDLDIRECT 132.0 12/05/2015 1510   Lab Results  Component Value Date   HGBA1C 11.2 (H) 09/28/2020   No results found for: VITAMINB12   Imaging I have reviewed the images obtained: MRI examination of the brain, MRA Reviewed by attending MD, fairly motion limited especially swan sequences Within limits of motion degradation there are some punctate infarcts in the left cerebellum, right occipital, right subcortical areas.   No large vessel occlusion on MRA which is fairly limited by motion to comment on stenoses  Echocardiogram 01/20/2021: "1. Left ventricular ejection fraction, by estimation, is 40%. The left ventricle has moderately decreased function. The left ventricle demonstrates regional wall motion abnormalities (see scoring diagram/findings for description). Left ventricular diastolic parameters are indeterminate. There is severe hypokinesis of the left ventricular, entire inferior wall and inferolateral wall.  2. Right ventricular systolic function is mildly reduced. The right ventricular size is normal. Tricuspid regurgitation signal is inadequate for assessing PA pressure.  3. The mitral valve is normal in structure. No evidence of mitral valve  regurgitation.  4. The aortic valve is tricuspid. Aortic valve regurgitation is not visualized. Mild aortic valve sclerosis is present, with no evidence of aortic valve stenosis.  5. The inferior vena cava is dilated in size with <50% respiratory variability, suggesting right atrial pressure of 15 mmHg."  Assessment: 50 year old male with PMHx as above who presented with a STEMI for a cardiac catheterization following a syncopal event and was found to have 2 acute lesions requiring intervention and developed AF with RVR intraoperatively and bilateral grip strength weakness and decreased sensation to the LLE post-operatively.  - Examination reveals significant weakness in the bilateral upper extremities, right greater than left as well as bilateral hip flexor weakness, with some mild left lower extremity weakness as well.  Reflexes are brisk which in the setting of his significant diabetic neuropathy is concerning for an upper motor neuron process. - Imaging consistent with subclinical injury from recent procedure, the punctate strokes seen are too small to explain the patient's symptoms. Specifically, he does not have significant watershed infarct. - DDx includes spinal compression of the cervical and/or thoracic spine with injury during syncopal event, or a nerve stretch injury which is exacerbating his known diabetic neuropathy  Impression: STEMI s/p cardiac catheterization Acute bilateral grip weakness and hand tingling Acute decreased sensation of left lower extremity Difficulty with urination  Recommendations:  - MRI brain / MRA head and neck without contrast completed on neurology recommendations and negative as above - Stroke labs: A1c ordered, goal less than 7% - Statin therapy for LDL 136, goal LDL < 70 - Stroke prophylaxis: Placed on ASA PO daily and Brilinta BID per cardiology with consideration of DOAC + clopidogrel with further evidence of atrial fibrillation episodes (initial  episode transient intraoperatively) - Frequent neuro checks - Risk factor modification - Telemetry monitoring, diet, exercise, smoking cessation, ethanol cessation and medication adherence advised - PT consult, OT consult,  - As B12 deficiency can contribute to myelopathy, will additionally obtain B12 level.  Please supplement for goal greater than 400 - Given negative MRI brain/MRA additionally recommend MRI cervical spine  - If this shows acute compressive etiology, neurosurgery should be consulted  - If this is negative for acute process, patient should have follow-up with outpatient neurology, order has been placed.    - Note: EMG/nerve conduction study findings develop 3 to 4 weeks post injury (  after sufficient wallerian degeneration has occurred)  [name], AGAC-NP [facility] Pager: [phone]  Attending Neurologist's note:  I personally saw this patient, gathering history, performing a full neurologic examination, reviewing relevant labs, personally reviewing relevant imaging including MRI brain and MRA, and formulated the assessment and plan, adding the note above for completeness and clarity to accurately reflect my thoughts   Frank Noe MD-PhD [facility] [phone] Available 7 AM to 7 PM, outside these hours please contact Neurologist on call listed on AMION

## 2021-01-22 ENCOUNTER — Encounter: Payer: Self-pay | Admitting: Interventional Cardiology

## 2021-01-22 ENCOUNTER — Inpatient Hospital Stay (HOSPITAL_COMMUNITY): Payer: Medicaid Other

## 2021-01-22 ENCOUNTER — Other Ambulatory Visit (HOSPITAL_COMMUNITY): Payer: Self-pay

## 2021-01-22 DIAGNOSIS — I2111 ST elevation (STEMI) myocardial infarction involving right coronary artery: Secondary | ICD-10-CM

## 2021-01-22 DIAGNOSIS — I5043 Acute on chronic combined systolic (congestive) and diastolic (congestive) heart failure: Secondary | ICD-10-CM

## 2021-01-22 LAB — HEMOGLOBIN A1C
Hgb A1c MFr Bld: 10.4 % — ABNORMAL HIGH (ref 4.8–5.6)
Hgb A1c MFr Bld: 10.4 % — ABNORMAL HIGH (ref 4.8–5.6)
Mean Plasma Glucose: 252 mg/dL
Mean Plasma Glucose: 252 mg/dL

## 2021-01-22 LAB — CBC
HCT: 40.3 % (ref 39.0–52.0)
Hemoglobin: 13.8 g/dL (ref 13.0–17.0)
MCH: 31.8 pg (ref 26.0–34.0)
MCHC: 34.2 g/dL (ref 30.0–36.0)
MCV: 92.9 fL (ref 80.0–100.0)
Platelets: 143 10*3/uL — ABNORMAL LOW (ref 150–400)
RBC: 4.34 MIL/uL (ref 4.22–5.81)
RDW: 12.7 % (ref 11.5–15.5)
WBC: 9.5 10*3/uL (ref 4.0–10.5)
nRBC: 0 % (ref 0.0–0.2)

## 2021-01-22 LAB — URINALYSIS, COMPLETE (UACMP) WITH MICROSCOPIC
Bilirubin Urine: NEGATIVE
Glucose, UA: 50 mg/dL — AB
Ketones, ur: NEGATIVE mg/dL
Leukocytes,Ua: NEGATIVE
Nitrite: NEGATIVE
Protein, ur: >=300 mg/dL — AB
RBC / HPF: >50 RBC/hpf — ABNORMAL HIGH (ref 0–5)
Specific Gravity, Urine: 1.016 (ref 1.005–1.030)
pH: 5 (ref 5.0–8.0)

## 2021-01-22 LAB — GLUCOSE, CAPILLARY
Glucose-Capillary: 174 mg/dL — ABNORMAL HIGH (ref 70–99)
Glucose-Capillary: 214 mg/dL — ABNORMAL HIGH (ref 70–99)
Glucose-Capillary: 152 mg/dL — ABNORMAL HIGH (ref 70–99)
Glucose-Capillary: 133 mg/dL — ABNORMAL HIGH (ref 70–99)

## 2021-01-22 LAB — VITAMIN B12: Vitamin B-12: 442 pg/mL (ref 180–914)

## 2021-01-22 MED ORDER — ENOXAPARIN SODIUM 40 MG/0.4ML IJ SOSY
40.0000 mg | PREFILLED_SYRINGE | INTRAMUSCULAR | Status: DC
  Administered 2021-01-22 – 2021-02-05 (×15): 40 mg via SUBCUTANEOUS
  Filled 2021-01-22 (×15): qty 0.4

## 2021-01-22 MED ORDER — LORAZEPAM 2 MG/ML IJ SOLN
1.0000 mg | Freq: Once | INTRAMUSCULAR | Status: AC
  Administered 2021-01-22: 1 mg via INTRAVENOUS
  Filled 2021-01-22: qty 1

## 2021-01-22 MED ORDER — HYDROMORPHONE HCL 1 MG/ML IJ SOLN
1.0000 mg | Freq: Once | INTRAMUSCULAR | Status: AC
  Administered 2021-01-22: 1 mg via INTRAVENOUS
  Filled 2021-01-22: qty 1

## 2021-01-22 MED ORDER — ATORVASTATIN CALCIUM 80 MG PO TABS
80.0000 mg | ORAL_TABLET | Freq: Every day | ORAL | Status: DC
  Administered 2021-01-22 – 2021-02-05 (×15): 80 mg via ORAL
  Filled 2021-01-22 (×15): qty 1

## 2021-01-22 MED ORDER — GABAPENTIN 300 MG PO CAPS
600.0000 mg | ORAL_CAPSULE | Freq: Two times a day (BID) | ORAL | Status: DC
  Administered 2021-01-22 – 2021-01-25 (×7): 600 mg via ORAL
  Filled 2021-01-22 (×7): qty 2

## 2021-01-22 NOTE — Progress Notes (Addendum)
Neurology Progress Note  S: No overnight events. States his weakness and sensation changes are about the same. MRI cspine attempted. Dilaudid helped with pain, but patient states he had muscle spasms in MRI. NP discussed importance of MRI cspine to r/p any possible surgical intervention that may improve his symptoms. We will try MRI Cspine again in am with order for Dilaudid and Robaxin on call.   O: Current vital signs: BP 92/66   Pulse 78   Temp 98.1 F (36.7 C)   Resp (!) 8   SpO2 94%  Vital signs in last 24 hours: Temp:  [97.1 F (36.2 C)-98.1 F (36.7 C)] 98.1 F (36.7 C) (06/06 1700) Pulse Rate:  [25-93] 78 (06/06 1830) Resp:  [0-23] 8 (06/06 1830) BP: (80-151)/(62-118) 92/66 (06/06 1830) SpO2:  [91 %-100 %] 94 % (06/06 1830)  GENERAL: Awake, alert in NAD HEENT: Normocephalic and atraumatic LUNGS: Normal respiratory effort.  CV: RRR on tele.  ABDOMEN: Soft, nontender Ext: warm  NEURO:  Mental Status: AA&Ox3  Speech/Language: speech is without aphasia or dysarthria.  Naming, repetition, fluency, and comprehension intact.  Cranial Nerves:  II: PERRL. Visual fields full.  III, IV, VI: EOMI. Eyelids elevate symmetrically.  V: Sensation is intact to light touch and symmetrical to face.  VII: Smile is symmetrical. Able to puff cheeks and raise eyebrows.  VIII: hearing intact to voice. IX, X: Palate elevates symmetrically. Phonation is normal.  SH:FWYOVZCH shrug 5/5. XII: tongue is midline without fasciculations. Motor:  Right deltoid 4-/5   Biceps 4-/5   Triceps 4/5   Grip 3+/5 Left deltoid 5/5   Biceps 4/5   Triceps 5/5   Grip 4-/5 Lower extremity strength is decreased in bilateral hip flexors.  He can not get his RUE to his mouth without use of his LUE.   Tone: is normal and bulk is normal Sensation- Intact to light touch bilaterally to all 4 extremities. Extinction absent to light touch to DSS.    Light touch sensation is decreased from under the xiphoid process  bilaterally to thighs. Tingling occurs when abdomen palpated.   Cold temperature decreased in the left thigh and below the knee except normal at the left calf/tibial area. Cold sensation is intact to RLE.  Cold temperature intact bilaterally to face and UEs.   Medications  Current Facility-Administered Medications:  .  acetaminophen (TYLENOL) tablet 650 mg, 650 mg, Oral, Q4H PRN, Arps, Donald Siva, MD, 650 mg at 01/22/21 1150 .  aspirin EC tablet 81 mg, 81 mg, Oral, Daily, Arps, Donald Siva, MD, 81 mg at 01/22/21 1015 .  atorvastatin (LIPITOR) tablet 80 mg, 80 mg, Oral, Daily, [place], Peter M, MD, 80 mg at 01/22/21 1014 .  carvedilol (COREG) tablet 3.125 mg, 3.125 mg, Oral, BID WC, Croitoru, Mihai, MD, 3.125 mg at 01/22/21 1710 .  Chlorhexidine Gluconate Cloth 2 % PADS 6 each, 6 each, Topical, Daily, Arps, Donald Siva, MD, 6 each at 01/22/21 1800 .  enoxaparin (LOVENOX) injection 40 mg, 40 mg, Subcutaneous, Q24H, [place], Peter M, MD, 40 mg at 01/22/21 1014 .  gabapentin (NEURONTIN) capsule 600 mg, 600 mg, Oral, BID, [place], Peter M, MD, 600 mg at 01/22/21 1015 .  insulin aspart (novoLOG) injection 0-15 Units, 0-15 Units, Subcutaneous, TID WC, Arps, Donald Siva, MD, 3 Units at 01/22/21 1710 .  insulin aspart (novoLOG) injection 6 Units, 6 Units, Subcutaneous, TID WC, Arps, Donald Siva, MD, 6 Units at 01/22/21 1711 .  insulin glargine (LANTUS) injection 30 Units, 30 Units,  Subcutaneous, Daily, Arps, Donald Siva, MD, 30 Units at 01/22/21 1015 .  methocarbamol (ROBAXIN) tablet 750 mg, 750 mg, Oral, Q6H PRN, Arps, Donald Siva, MD, 750 mg at 01/22/21 0839 .  nitroGLYCERIN (NITROSTAT) SL tablet 0.4 mg, 0.4 mg, Sublingual, Q5 Min x 3 PRN, Arps, Kelly E, MD .  ondansetron (ZOFRAN) injection 4 mg, 4 mg, Intravenous, Q6H PRN, Arps, Donald Siva, MD .  ticagrelor (BRILINTA) tablet 90 mg, 90 mg, Oral, BID, Arps, Donald Siva, MD, 90 mg at 01/22/21 1015   Assessment: 50 yo male admitted 2 days ago with STEMI following a syncopal event. He had 2  stents. Bilateral grip strength and decreased sensation to the LLE post operative.  Acute bilateral grip weakness and hand tingling with acutely decreased sensation of left lower extremity. CT spine was obtained, but will not delineate particular finding for surgical correction to improve his symptoms of myelopathy.   Plan: 1. MRI cspine wwo -really need this. Robaxin and Dilaudid on call to MRI in am.  2. If any compressive findings, will consult NSU.  3. If no compressive/surgical findings, patient should f/up with out patient neurology. Consider EMG/NCS.  4. Continue PT/OT.  5. Will need recommendations by PT for disposition for further therapy. 6. Continue stroke prophylaxis with ASA and Brilinta per cardiology with consideration of DOAC plus Plavix with any further evidence of AF episodes.  7. Continue neuro checks.  8. B12 level 442. No supplementation needed at this time.   Pt seen by Clance Boll, MSN, APN-BC/Nurse Practitioner/Neuro. Plan discussed with MD who approves.   Pager: [phone]  Attending attestation  I agree with the findings in NP Kirby-Graham's note above. Initial MRI c spine suggested severe cervical stenosis but was too degraded by motion to rely on for interpretation. Patient agrees to reattempt in AM with premeds. If severe cervical stenosis confirmed will consult NSU. Apparently sx began after procedure with intubation and may have been exacerbated by neck extension/positioning for intubation.  Su Monks, MD [facility] [phone] If 7pm- 7am, please page neurology on call as listed in [place].

## 2021-01-22 NOTE — Progress Notes (Signed)
Cardiology Emilio Aspen, MD) called and made aware of MRI results.

## 2021-01-22 NOTE — Evaluation (Signed)
Occupational Therapy Evaluation Patient Details Name: [name] MRN: [id number] DOB: [birth year] Today's Date: 01/22/2021    History of Present Illness 50 yo admitted 6/4 with STEMI with in stent thrombosis with Afib with RVR needing IABP. 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 CT: C5-6: Moderate to large broad-based disc protrusion asymmetric left  with mass effect on the ventral thecal sac and obliteration of the  ventral CSF space. PMhx: HTN, HLD, NASH cirrhosis, DM, neuropathy, cellulitis, Rt transmet   Clinical Impression   This 50 yo admitted with above presents to acute OT with PLOF of being independent to Mod I with all basic ADLs and mobility and working full time as a Administrator. He currently has decreased AROM of all 4 extremities, decreased mobility (per PT eval), decreased sitting balance (per PT eval), and increased pain in RUE (shoulder) all affecting his safety and independence with basic ADLs. He will continue to benefit from acute OT with follow up on CIR.    Follow Up Recommendations  CIR;Supervision/Assistance - 24 hour    Equipment Recommendations  Other (comment) (TBD next venue)       Precautions / Restrictions Precautions Precautions: Fall Precaution Comments: bil UE and LE weakness Restrictions Weight Bearing Restrictions: No             ADL either performed or assessed with clinical judgement   ADL Overall ADL's : Needs assistance/impaired Eating/Feeding: Moderate assistance;Bed level   Grooming: Maximal assistance;Bed level   Upper Body Bathing: Maximal assistance;Bed level   Lower Body Bathing: Total assistance;Bed level   Upper Body Dressing : Maximal assistance;Bed level   Lower Body Dressing: Total assistance;Bed level                       Vision Patient Visual Report: No change from baseline              Pertinent Vitals/Pain Pain Assessment: 0-10 Pain Score:  3  Pain Location: Rt shoulder Pain Descriptors / Indicators: Aching;Constant;Sore Pain Intervention(s): Limited activity within patient's tolerance;Monitored during session;Repositioned     Hand Dominance Right   Extremity/Trunk Assessment Upper Extremity Assessment Upper Extremity Assessment: LUE deficits/detail RUE Deficits / Details: limited shoulder flexion grossly 40 degrees; can sometimes initiate shoudler flexion but only ~1/3 range, if he can flex at elbow then he can fully extend; minimal supination (pt reports he couldn't do this before); 1/5 wrist; tendosis grip but can open and close fingers if tenodesis movement is blocked;can oppose thumb and digits 1 and 2 LUE Deficits / Details: 3/5 shoulder flexion; 3/5 elbow flexion/extension; wrist 2/5; 2/5 finger flexion; can oppose thumb and digits 1 and 2     Communication Communication Communication: No difficulties   Cognition Arousal/Alertness: Awake/alert Behavior During Therapy: WFL for tasks assessed/performed Overall Cognitive Status: Within Functional Limits for tasks assessed                                                Home Living Family/patient expects to be discharged to:: Inpatient rehab Living Arrangements: Alone Available Help at Discharge: Friend(s);Available PRN/intermittently Type of Home: Apartment Home Access: Level entry     Home Layout: One level     Bathroom Shower/Tub: Teacher, early years/pre: Standard  Home Equipment: Kasandra Knudsen - single point   Additional Comments: truck driver, walks with a cane      Prior Functioning/Environment Level of Independence: Independent with assistive device(s)                 OT Problem List: Decreased strength;Decreased range of motion;Impaired balance (sitting and/or standing);Decreased coordination;Impaired UE functional use;Decreased knowledge of use of DME or AE;Pain      OT Treatment/Interventions: Self-care/ADL  training;Therapeutic exercise;DME and/or AE instruction;Balance training;Patient/family education    OT Goals(Current goals can be found in the care plan section) Acute Rehab OT Goals Patient Stated Goal: to be able to do everything for myself OT Goal Formulation: With patient Time For Goal Achievement: 02/05/21 Potential to Achieve Goals: Good  OT Frequency: Min 2X/week   Barriers to D/C: Decreased caregiver support             AM-PAC OT "6 Clicks" Daily Activity     Outcome Measure Help from another person eating meals?: A Lot Help from another person taking care of personal grooming?: A Lot Help from another person toileting, which includes using toliet, bedpan, or urinal?: Total Help from another person bathing (including washing, rinsing, drying)?: A Lot Help from another person to put on and taking off regular upper body clothing?: A Lot Help from another person to put on and taking off regular lower body clothing?: Total 6 Click Score: 10   End of Session    Activity Tolerance: Patient tolerated treatment well Patient left: in bed;with call bell/phone within reach  OT Visit Diagnosis: Other abnormalities of gait and mobility (R26.89);Muscle weakness (generalized) (M62.81);Pain;Other (comment) (quadraparesis) Pain - Right/Left: Right Pain - part of body: Shoulder                Time: 1157-2620 OT Time Calculation (min): 8 min Charges:  OT General Charges $OT Visit: 1 Visit OT Evaluation $OT Eval Low Complexity: 1 Low  Golden Circle, OTR/L Acute [employer] Pager [phone] Office [phone]     Almon Register 01/22/2021, 2:26 PM

## 2021-01-22 NOTE — Progress Notes (Signed)
PT Cancellation Note  Patient Details Name: [name] MRN: [id number] DOB: Jan 17, 1971   Cancelled Treatment:    Reason Eval/Treat Not Completed: Active bedrest order   Sandy Salaam Trinitee Horgan 01/22/2021, 6:44 AM  Bayard Males, PT Acute Rehabilitation Services Pager: [phone] Office: [phone]

## 2021-01-22 NOTE — TOC Benefit Eligibility Note (Signed)
Patient Advocate Encounter  Insurance verification completed.    The patient is uninsured  Yared Barefoot, CPhT Pharmacy Patient Advocate Specialist [facility] Team Direct Number: [phone]  Fax: [phone]        

## 2021-01-22 NOTE — Progress Notes (Signed)
Inpatient Diabetes Program Recommendations  AACE/ADA: New Consensus Statement on Inpatient Glycemic Control (2015)  Target Ranges:  Prepandial:   less than 140 mg/dL      Peak postprandial:   less than 180 mg/dL (1-2 hours)      Critically ill patients:  140 - 180 mg/dL   Lab Results  Component Value Date   GLUCAP 214 (H) 01/22/2021   HGBA1C 10.4 (H) 01/19/2021    Review of Glycemic Control Results for DELMO, MATTY (MRN [id number]) as of 01/22/2021 15:26  Ref. Range 01/21/2021 11:15 01/21/2021 15:30 01/21/2021 21:22 01/22/2021 07:39 01/22/2021 11:48  Glucose-Capillary Latest Ref Range: 70 - 99 mg/dL 242 (H) 232 (H) 162 (H) 174 (H) 214 (H)   Diabetes history: DM 2 Outpatient Diabetes medications:  Glucotrol 10 mg daily Basaglar/Humalog- Patient states he stopped taking it one month ago Current orders for Inpatient glycemic control:  Novolog moderate tid with meals Novolog 6 units tid with meals Lantus 30 units daily  Inpatient Diabetes Program Recommendations:    Consider increasing Lantus to 35 units daily.  Also consider increasing Novolog meal coverage to 8 units tid with meals.   Note that patient was started on insulin during previous hospitalization.  He had agreed to use short term for healing, however it appears that insulin was stopped by patient due to medication reconciliation saying "No longer taking". Called and spoke with patient by phone.  He confirms that he stopped taking insulin. Discussed with patient that he likely needs insulin in order to control blood sugars.  He confirms that blood sugars did go up once he stopped taking it.  He is agreeable to restarting insulin at discharge. Diabetes coordinators spoke to patient at length in February, 2022.  Patient states he does not have any questions related to his diabetes at this time.   Will follow.   Thanks  Adah Perl, RN, BC-ADM Inpatient Diabetes Coordinator Pager [phone] (8a-5p)

## 2021-01-22 NOTE — Progress Notes (Signed)
Rehab Admissions Coordinator Note:  Patient was screened by Cleatrice Burke for appropriateness for an Inpatient Acute Rehab Consult per therapy recs.   At this time, we are recommending Inpatient Rehab consult. Please place order for consult if you would like him considered for CIR admit. Please advise.  Cleatrice Burke RN MSN 01/22/2021, 2:49 PM  I can be reached at [phone].

## 2021-01-22 NOTE — Progress Notes (Signed)
Occupational Therapy Treatment Patient Details Name: [name] MRN: [id number] DOB: 1971/03/19 Today's Date: 01/22/2021    History of present illness 50 yo admitted 6/4 with STEMI with in stent thrombosis with Afib with RVR needing IABP. 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 CT: C5-6: Moderate to large broad-based disc protrusion asymmetric left  with mass effect on the ventral thecal sac and obliteration of the  ventral CSF space. PMhx: HTN, HLD, NASH cirrhosis, DM, neuropathy, cellulitis, Rt transmet   OT comments  Pt seen to for a treatment session to focus on self feeding with trying out various adaptive equipment. With proper setup he can feed himself ~75% of his meal with increased time, effort, and adaptive equipment. We will continue to follow.  Follow Up Recommendations  CIR;Supervision/Assistance - 24 hour    Equipment Recommendations  Other (comment) (TBD next venue)       Precautions / Restrictions Precautions Precautions: Fall Precaution Comments: bil UE and LE weakness Restrictions Weight Bearing Restrictions: No              ADL either performed or assessed with clinical judgement   ADL Overall ADL's : Needs assistance/impaired Eating/Feeding: Minimal assistance;Bed level;With adaptive utensils;With assist to don/doff brace/orthosis Eating/Feeding Details (indicate cue type and reason): Pt can self feed ~75% of meal if set up properly (in bed with HOB up, pillow on lap with towel on top of pillow and towel on patient's chest to help with spillage, plate placed on pillow with plate guard on it with opening to plate guard facing toward's pt's left hand, towel or washcloth placed under right hand side of plate to put it at an angle due to pateint has difficulty with supination and pronation); built up utensils with red tubing (spoon turned up but fork turned down in red tubing), also has velcro u-cuff to use;  two handled cup with straw lid on it placed on his bedside table to his left hand side on lower part of tray table.                                 Cognition Arousal/Alertness: Awake/alert Behavior During Therapy: WFL for tasks assessed/performed Overall Cognitive Status: Within Functional Limits for tasks assessed                                                     Pertinent Vitals/ Pain       Pain Assessment: 0-10 Pain Score: 3  Pain Location: Rt shoulder Pain Descriptors / Indicators: Aching;Constant;Sore Pain Intervention(s): Limited activity within patient's tolerance;Monitored during session;Repositioned      Frequency  Min 2X/week        Progress Toward Goals  OT Goals(current goals can now be found in the care plan section)  Progress towards OT goals: Progressing toward goals  Acute Rehab OT Goals Patient Stated Goal: to be able to do everything for myself OT Goal Formulation: With patient Time For Goal Achievement: 02/05/21 Potential to Achieve Goals: Good ADL Goals Pt Will Perform Eating: with adaptive utensils;with set-up (supported sitting) Pt Will Perform Grooming: with set-up;with adaptive equipment (supported sitting, 2 tasks) Pt Will Perform Upper Body Bathing: with set-up;with adaptive equipment (supported sitting) Pt/caregiver  will Perform Home Exercise Program: Increased ROM;Increased strength;Both right and left upper extremity;With written HEP provided;With minimal assist (A/AAROM) Additional ADL Goal #1: Pt will be min A to roll left and right in bed to A with basic ADLs Additional ADL Goal #2: Pt will be able to sit EOB with min A in prep for transfers and ADLs at [place] Discharge plan remains appropriate       AM-PAC OT "6 Clicks" Daily Activity     Outcome Measure   Help from another person eating meals?: A Little Help from another person taking care of personal grooming?: A Lot Help from another person  toileting, which includes using toliet, bedpan, or urinal?: Total Help from another person bathing (including washing, rinsing, drying)?: A Lot Help from another person to put on and taking off regular upper body clothing?: A Lot Help from another person to put on and taking off regular lower body clothing?: Total 6 Click Score: 11    End of Session Equipment Utilized During Treatment:  (d-ring ucuff, plate guard, red tubing, two handled cup)  OT Visit Diagnosis: Other abnormalities of gait and mobility (R26.89);Muscle weakness (generalized) (M62.81);Pain;Other (comment) (quadraparesis) Pain - Right/Left: Right Pain - part of body: Shoulder   Activity Tolerance Patient tolerated treatment well   Patient Left in bed;with call bell/phone within reach   Nurse Communication  (Called RN into room to educate her on what setup we had been working on to give pt the most success with self feeding.)        Time: 1310-1343 OT Time Calculation (min): 33 min  Charges: OT General Charges $OT Visit: 1 Visit OT Evaluation $OT Eval Low Complexity: 1 Low OT Treatments $Self Care/Home Management : 23-37 mins [name], OTR/L Acute [employer] Pager [phone] Office [phone]      Almon Register 01/22/2021, 2:43 PM

## 2021-01-22 NOTE — Evaluation (Addendum)
Physical Therapy Evaluation Patient Details Name: [name] MRN: [id number] DOB: [birth year] Today's Date: 01/22/2021   History of Present Illness  50 yo admitted 6/4 with STEMI with in stent thrombosis with Afib with RVR needing IABP. Pt with noted bil UE and LE weaknss with MRI brain negative and MRI cspine with C4-7 stenosis. CT: C5-6: Moderate to large broad-based disc protrusion asymmetric left with mass effect on the ventral thecal sac and obliteration of the ventral CSF spacePMhx: HTN, HLD, NASH cirrhosis, DM, neuropathy, cellulitis, Rt transmet  Clinical Impression  Pt pleasant and wanting to get OOB to chair. Pt demonstrates significant weakness bil LE with increased extensor tone as well as very limited bil UE grip strength and lack of RUE shoulder flexion/elbow flexion and shoulder abduction. 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 Pt reports bil UE and LE numbness and right shoulder pain. Pt with decreased strength, balance, functional mobility and activity tolerance who will benefit from acute therapy to maximize mobility and function.    92% RA HR 90   Follow Up Recommendations CIR    Equipment Recommendations  Wheelchair cushion (measurements PT);Wheelchair (measurements PT);Hospital bed    Recommendations for Other Services OT consult;Rehab consult     Precautions / Restrictions Precautions Precautions: Fall Precaution Comments: bil UE and LE weakness      Mobility  Bed Mobility Overal bed mobility: Needs Assistance Bed Mobility: Rolling;Sidelying to Sit;Sit to Sidelying Rolling: Max assist Sidelying to sit: Max assist     Sit to sidelying: Max assist General bed mobility comments: physical assist to bend left knee, reach across body and roll to right with assist to clear legs and elevate trunk. Return to supine with assist to control trunk and lift  legs to surface. Total +2 to slide toward [facility]    Transfers                 General transfer comment: unable  Ambulation/Gait                Stairs            Wheelchair Mobility    Modified Rankin (Stroke Patients Only)       Balance Overall balance assessment: Needs assistance Sitting-balance support: Bilateral upper extremity supported;No upper extremity supported;Feet supported Sitting balance-Leahy Scale: Poor Sitting balance - Comments: pt with tendency for posterior left lean in unsupported sitting min assist, with LUE support minguard                                     Pertinent Vitals/Pain Pain Assessment: 0-10 Pain Score: 5  Pain Location: Rt shoulder Pain Descriptors / Indicators: Aching;Constant Pain Intervention(s): Limited activity within patient's tolerance;Monitored during session;Repositioned;Patient requesting pain meds-RN notified    Home Living Family/patient expects to be discharged to:: Private residence Living Arrangements: Alone Available Help at Discharge: Friend(s);Available PRN/intermittently Type of Home: Apartment Home Access: Level entry     Home Layout: One level Home Equipment: Kasandra Knudsen - single point Additional Comments: truck driver, walks with a cane    Prior Function Level of Independence: Independent with assistive device(s)               Hand Dominance        Extremity/Trunk Assessment   Upper Extremity Assessment Upper Extremity  Assessment: RUE deficits/detail;LUE deficits/detail RUE Deficits / Details: limited shoulder flexion grossly 40 degrees, no active abduction or elbow flexion, 1/5 grip LUE Deficits / Details: 3/5 shoulder flexion and abduction, 1/5 grip, 3/5 elbow flexion/extension    Lower Extremity Assessment Lower Extremity Assessment: RLE deficits/detail;LLE deficits/detail RLE Deficits / Details: 2-/5 hip flexion, 2-/5 knee flexion with tendency for extensor tone and  assist to break tone for knee flexion, 2/5 hip abduction/ADD LLE Deficits / Details: 2-/5 hip flexion, 2-/5 knee flexion with tendency for extensor tone and assist to break tone for knee flexion, 2/5 hip abduction/ADD, no active dorsiflexion with extensor tone into plantarflexion    Cervical / Trunk Assessment Cervical / Trunk Assessment: Other exceptions Cervical / Trunk Exceptions: limited core engagement in sitting with tendency for posterior left lean  Communication   Communication: No difficulties  Cognition Arousal/Alertness: Awake/alert Behavior During Therapy: WFL for tasks assessed/performed Overall Cognitive Status: Within Functional Limits for tasks assessed                                        General Comments      Exercises General Exercises - Lower Extremity Heel Slides: AAROM;Both;Supine;5 reps Hip ABduction/ADduction: AAROM;Both;5 reps;Supine   Assessment/Plan    PT Assessment Patient needs continued PT services  PT Problem List Decreased strength;Decreased mobility;Decreased safety awareness;Decreased range of motion;Decreased coordination;Decreased activity tolerance;Decreased balance;Decreased knowledge of use of DME;Impaired sensation;Pain       PT Treatment Interventions DME instruction;Therapeutic exercise;Functional mobility training;Therapeutic activities;Patient/family education;Neuromuscular re-education;Balance training;Wheelchair mobility training    PT Goals (Current goals can be found in the Care Plan section)  Acute Rehab PT Goals Patient Stated Goal: be able to walk PT Goal Formulation: With patient Time For Goal Achievement: 02/05/21 Potential to Achieve Goals: Fair    Frequency Min 4X/week   Barriers to discharge Decreased caregiver support      Co-evaluation               AM-PAC PT "6 Clicks" Mobility  Outcome Measure Help needed turning from your back to your side while in a flat bed without using bedrails?:  Total Help needed moving from lying on your back to sitting on the side of a flat bed without using bedrails?: Total Help needed moving to and from a bed to a chair (including a wheelchair)?: Total Help needed standing up from a chair using your arms (e.g., wheelchair or bedside chair)?: Total Help needed to walk in hospital room?: Total Help needed climbing 3-5 steps with a railing? : Total 6 Click Score: 6    End of Session   Activity Tolerance: Patient tolerated treatment well Patient left: in bed;with call bell/phone within reach;with nursing/sitter in room Nurse Communication: Mobility status PT Visit Diagnosis: Other abnormalities of gait and mobility (R26.89);Muscle weakness (generalized) (M62.81);Other symptoms and signs involving the nervous system (R29.898)    Time: 7017-7939 PT Time Calculation (min) (ACUTE ONLY): 33 min   Charges:   PT Evaluation $PT Eval Moderate Complexity: 1 Mod PT Treatments $Therapeutic Activity: 8-22 mins        Christropher Gintz P, PT Acute Rehabilitation Services Pager: [phone] Office: [phone]   Nachelle Negrette B Rimsha Trembley 01/22/2021, 12:09 PM

## 2021-01-22 NOTE — Progress Notes (Addendum)
Progress Note  Patient Name: [name] Date of Encounter: 01/22/2021  [facility] Cardiologist: None   Subjective   No angina.  No dyspnea.   He still complains of weak grip in both hands.  He is unable to fully flex his fingers and make a fist.  He also complains of reduced sensation in his left lower extremity- this has not changed.   Had difficulty urinating, but is able to void when able to sit up.  Inpatient Medications    Scheduled Meds: . aspirin EC  81 mg Oral Daily  . atorvastatin  80 mg Oral Daily  . carvedilol  3.125 mg Oral BID WC  . Chlorhexidine Gluconate Cloth  6 each Topical Daily  . gabapentin  300 mg Oral q morning  . gabapentin  600 mg Oral QHS  . insulin aspart  0-15 Units Subcutaneous TID WC  . insulin aspart  6 Units Subcutaneous TID WC  . insulin glargine  30 Units Subcutaneous Daily  . ticagrelor  90 mg Oral BID   Continuous Infusions:  PRN Meds: acetaminophen, methocarbamol, nitroGLYCERIN, ondansetron (ZOFRAN) IV   Vital Signs    Vitals:   01/22/21 0300 01/22/21 0400 01/22/21 0500 01/22/21 0600  BP: 105/79 114/87 93/62 (!) 88/64  Pulse: 75 79 76 73  Resp: 13 20 15 13   Temp:  97.9 F (36.6 C)    TempSrc:  Oral    SpO2: 98% 97% 99% 96%    Intake/Output Summary (Last 24 hours) at 01/22/2021 0840 Last data filed at 01/22/2021 0000 Gross per 24 hour  Intake 340 ml  Output 1701 ml  Net -1361 ml   Last 3 Weights 01/19/2021 11/02/2020 10/30/2020  Weight (lbs) 220 lb 220 lb 220 lb  Weight (kg) 99.791 kg 99.791 kg 99.791 kg      Telemetry    Sinus rhythm, rare PACs- no AV block or Afib- Personally Reviewed  ECG    Shows sinus rhythm with one PAC, with evolving inferoposterior infarction pattern, still with some subtle ST segment depression in V1-V3.  Marginally prolonged QT 473 ms- Personally Reviewed  Physical Exam   Appears comfortable sitting up in bed. GEN: No acute distress.   Neck: No JVD Cardiac: RRR, no murmurs, rubs, or  gallops.  Respiratory: Clear to auscultation bilaterally. GI: Soft, nontender, non-distended  MS: No edema; right foot transmetatarsal amputation.  Right groin without hematoma or bleeding. Neuro:   PERRL/EOMI, nonfocal cranial nerve exam, symmetrical face and expression, grip strength 4/5 bilaterally, slightly stronger in dominant right hand Psych: Normal affect   Labs    High Sensitivity Troponin:   Recent Labs  Lab 01/19/21 2014  TROPONINIHS 2,012*      Chemistry Recent Labs  Lab 01/19/21 2014 01/20/21 0320 01/21/21 0140  NA 134* 130* 132*  K 3.8 4.0 3.8  CL 102 99 102  CO2 21* 18* 24  GLUCOSE 392* 450* 215*  BUN 11 19 22*  CREATININE 1.12 1.43* 1.42*  CALCIUM 8.3* 8.1* 8.3*  PROT 6.7  --   --   ALBUMIN 2.9*  --   --   AST 36  --   --   ALT 34  --   --   ALKPHOS 162*  --   --   BILITOT 0.9  --   --   GFRNONAA >60 60* >60  ANIONGAP 11 13 6      Hematology Recent Labs  Lab 01/20/21 0320 01/21/21 0140 01/22/21 0733  WBC 14.0* 11.0*  9.5  RBC 4.75 4.26 4.34  HGB 14.9 13.3 13.8  HCT 42.9 39.1 40.3  MCV 90.3 91.8 92.9  MCH 31.4 31.2 31.8  MCHC 34.7 34.0 34.2  RDW 12.7 12.9 12.7  PLT 186 144* 143*    BNPNo results for input(s): BNP, PROBNP in the last 168 hours.   DDimer No results for input(s): DDIMER in the last 168 hours.   Radiology    MR ANGIO HEAD WO CONTRAST  Result Date: 01/21/2021 CLINICAL DATA:  Sudden loss of consciousness.  Recent STEMI. EXAM: MRI HEAD WITHOUT CONTRAST MRA HEAD WITHOUT CONTRAST TECHNIQUE: Multiplanar, multi-echo pulse sequences of the brain and surrounding structures were acquired without intravenous contrast. Angiographic images of the Circle of Willis were acquired using MRA technique without intravenous contrast. COMPARISON: No pertinent prior exam. COMPARISON:  No pertinent prior exam. FINDINGS: MRI HEAD FINDINGS Motion degraded study. Brain: There are 3 punctate foci of abnormal diffusion restriction, which are located in the  left parietal lobe, left occipital lobe and left cerebellum. No acute hemorrhage or mass effect. No acute or chronic hemorrhage. Normal white matter signal, parenchymal volume and CSF spaces. The midline structures are normal. Vascular: Major flow voids are preserved. Skull and upper cervical spine: Normal calvarium and skull base. Visualized upper cervical spine and soft tissues are normal. Sinuses/Orbits:No paranasal sinus fluid levels or advanced mucosal thickening. No mastoid or middle ear effusion. Normal orbits. MRA HEAD FINDINGS POSTERIOR CIRCULATION: --Vertebral arteries: Normal --Inferior cerebellar arteries: Normal. --Basilar artery: Normal. --Superior cerebellar arteries: Normal. --Posterior cerebral arteries: Normal. ANTERIOR CIRCULATION: --Intracranial internal carotid arteries: Normal. --Anterior cerebral arteries (ACA): Normal. --Middle cerebral arteries (MCA): Normal. ANATOMIC VARIANTS: None IMPRESSION: 1. Motion degraded examination. 2. 3 punctate foci of acute ischemia, within the left parietal lobe, left occipital lobe and left cerebellum. No hemorrhage or mass effect. 3. Normal intracranial MRA. Electronically Signed   By: Ulyses Jarred M.D.   On: 01/21/2021 19:16   MR BRAIN WO CONTRAST  Result Date: 01/21/2021 CLINICAL DATA:  Sudden loss of consciousness.  Recent STEMI. EXAM: MRI HEAD WITHOUT CONTRAST MRA HEAD WITHOUT CONTRAST TECHNIQUE: Multiplanar, multi-echo pulse sequences of the brain and surrounding structures were acquired without intravenous contrast. Angiographic images of the Circle of Willis were acquired using MRA technique without intravenous contrast. COMPARISON: No pertinent prior exam. COMPARISON:  No pertinent prior exam. FINDINGS: MRI HEAD FINDINGS Motion degraded study. Brain: There are 3 punctate foci of abnormal diffusion restriction, which are located in the left parietal lobe, left occipital lobe and left cerebellum. No acute hemorrhage or mass effect. No acute or  chronic hemorrhage. Normal white matter signal, parenchymal volume and CSF spaces. The midline structures are normal. Vascular: Major flow voids are preserved. Skull and upper cervical spine: Normal calvarium and skull base. Visualized upper cervical spine and soft tissues are normal. Sinuses/Orbits:No paranasal sinus fluid levels or advanced mucosal thickening. No mastoid or middle ear effusion. Normal orbits. MRA HEAD FINDINGS POSTERIOR CIRCULATION: --Vertebral arteries: Normal --Inferior cerebellar arteries: Normal. --Basilar artery: Normal. --Superior cerebellar arteries: Normal. --Posterior cerebral arteries: Normal. ANTERIOR CIRCULATION: --Intracranial internal carotid arteries: Normal. --Anterior cerebral arteries (ACA): Normal. --Middle cerebral arteries (MCA): Normal. ANATOMIC VARIANTS: None IMPRESSION: 1. Motion degraded examination. 2. 3 punctate foci of acute ischemia, within the left parietal lobe, left occipital lobe and left cerebellum. No hemorrhage or mass effect. 3. Normal intracranial MRA. Electronically Signed   By: Ulyses Jarred M.D.   On: 01/21/2021 19:16   MR CERVICAL SPINE WO CONTRAST  Result  Date: 01/22/2021 CLINICAL DATA:  Progressive myelopathy EXAM: MRI CERVICAL SPINE WITHOUT CONTRAST TECHNIQUE: Multiplanar, multisequence MR imaging of the cervical spine was performed. No intravenous contrast was administered. COMPARISON:  None. FINDINGS: Examination is severely degraded by motion. Only sagittal T2-weighted imaging was obtained. The cervical spinal canal appears severely stenotic at the C4-7 levels. IMPRESSION: 1. Severely motion degraded and truncated examination. 2. Apparent severe spinal canal stenosis at the C4-7 levels. CT of the cervical spine might be helpful, due to shorter scan time. Electronically Signed   By: Ulyses Jarred M.D.   On: 01/22/2021 03:29   ECHOCARDIOGRAM COMPLETE  Result Date: 01/20/2021    ECHOCARDIOGRAM REPORT   Patient Name:   CLARKSON ROSSELLI Date of Exam:  01/20/2021 Medical Rec #:  [id number]       Height:       74.0 in Accession #:    [id number]      Weight:       220.0 lb Date of Birth:  1971/07/04       BSA:          2.263 m Patient Age:    49 years        BP:           104/74 mmHg Patient Gender: M               HR:           87 bpm. Exam Location:  Inpatient Procedure: 2D Echo, Color Doppler, Cardiac Doppler and Intracardiac            Opacification Agent Indications:    Acute MI i21.9  History:        Patient has no prior history of Echocardiogram examinations.                 CAD; Risk Factors:Hypertension, Diabetes and Dyslipidemia.  Sonographer:    Raquel Sarna Senior RDCS Referring Phys: [id number] KELLY E ARPS  Sonographer Comments: Positioning limited by balloon pump. IMPRESSIONS  1. Left ventricular ejection fraction, by estimation, is 40%. The left ventricle has moderately decreased function. The left ventricle demonstrates regional wall motion abnormalities (see scoring diagram/findings for description). Left ventricular diastolic parameters are indeterminate. There is severe hypokinesis of the left ventricular, entire inferior wall and inferolateral wall.  2. Right ventricular systolic function is mildly reduced. The right ventricular size is normal. Tricuspid regurgitation signal is inadequate for assessing PA pressure.  3. The mitral valve is normal in structure. No evidence of mitral valve regurgitation.  4. The aortic valve is tricuspid. Aortic valve regurgitation is not visualized. Mild aortic valve sclerosis is present, with no evidence of aortic valve stenosis.  5. The inferior vena cava is dilated in size with <50% respiratory variability, suggesting right atrial pressure of 15 mmHg. FINDINGS  Left Ventricle: Left ventricular ejection fraction, by estimation, is 40%. The left ventricle has moderately decreased function. The left ventricle demonstrates regional wall motion abnormalities. Severe hypokinesis of the left ventricular, entire inferior wall and  inferolateral wall. Definity contrast agent was given IV to delineate the left ventricular endocardial borders. The left ventricular internal cavity size was normal in size. There is no left ventricular hypertrophy. Left ventricular diastolic parameters are indeterminate. Indeterminate filling pressures. Right Ventricle: The right ventricular size is normal. No increase in right ventricular wall thickness. Right ventricular systolic function is mildly reduced. Tricuspid regurgitation signal is inadequate for assessing PA pressure. Left Atrium: Left atrial size was normal in size. Right Atrium: Right  atrial size was normal in size. Pericardium: There is no evidence of pericardial effusion. Mitral Valve: The mitral valve is normal in structure. No evidence of mitral valve regurgitation. Tricuspid Valve: The tricuspid valve is normal in structure. Tricuspid valve regurgitation is not demonstrated. Aortic Valve: The aortic valve is tricuspid. Aortic valve regurgitation is not visualized. Mild aortic valve sclerosis is present, with no evidence of aortic valve stenosis. Pulmonic Valve: The pulmonic valve was grossly normal. Pulmonic valve regurgitation is not visualized. Aorta: The aortic root and ascending aorta are structurally normal, with no evidence of dilitation. Venous: The inferior vena cava is dilated in size with less than 50% respiratory variability, suggesting right atrial pressure of 15 mmHg. IAS/Shunts: No atrial level shunt detected by color flow Doppler.  LEFT VENTRICLE PLAX 2D LVIDd:         5.10 cm  Diastology LVIDs:         4.10 cm  LV e' medial:    5.22 cm/s LV PW:         1.10 cm  LV E/e' medial:  9.5 LV IVS:        1.00 cm  LV e' lateral:   3.92 cm/s LVOT diam:     2.00 cm  LV E/e' lateral: 12.7 LV SV:         30 LV SV Index:   13 LVOT Area:     3.14 cm  RIGHT VENTRICLE RV S prime:     6.96 cm/s TAPSE (M-mode): 1.0 cm LEFT ATRIUM             Index       RIGHT ATRIUM           Index LA diam:         3.10 cm 1.37 cm/m  RA Area:     14.60 cm LA Vol (A2C):   53.6 ml 23.68 ml/m RA Volume:   44.30 ml  19.57 ml/m LA Vol (A4C):   42.7 ml 18.87 ml/m LA Biplane Vol: 49.4 ml 21.83 ml/m  AORTIC VALVE LVOT Vmax:   57.40 cm/s LVOT Vmean:  42.500 cm/s LVOT VTI:    0.094 m  AORTA Ao Root diam: 3.10 cm Ao Asc diam:  3.20 cm MITRAL VALVE MV Area (PHT): 4.44 cm    SHUNTS MV Decel Time: 171 msec    Systemic VTI:  0.09 m MV E velocity: 49.70 cm/s  Systemic Diam: 2.00 cm MV A velocity: 45.80 cm/s MV E/A ratio:  1.09 Mihai Croitoru MD Electronically signed by Sanda Klein MD Signature Date/Time: 01/20/2021/12:14:44 PM    Final     Cardiac Studies   Echocardiogram 01/20/2021  1. Left ventricular ejection fraction, by estimation, is 40%. The left  ventricle has moderately decreased function. The left ventricle  demonstrates regional wall motion abnormalities (see scoring  diagram/findings for description). Left ventricular  diastolic parameters are indeterminate. There is severe hypokinesis of the  left ventricular, entire inferior wall and inferolateral wall.  2. Right ventricular systolic function is mildly reduced. The right  ventricular size is normal. Tricuspid regurgitation signal is inadequate  for assessing PA pressure.  3. The mitral valve is normal in structure. No evidence of mitral valve  regurgitation.  4. The aortic valve is tricuspid. Aortic valve regurgitation is not  visualized. Mild aortic valve sclerosis is present, with no evidence of  aortic valve stenosis.  5. The inferior vena cava is dilated in size with <50% respiratory  variability, suggesting right atrial pressure  of 15 mmHg.    CATH 01/19/2021   Mid RCA lesion is 100% stenosed- very late stent thrombosis. Aspiration thrombectomy was performed which restored TIMI-3 flow.  Balloon angioplasty was performed using a BALLOON [place] [id number].  Post intervention, there is a 0% residual stenosis.  Mid Cx lesion is  100% stenosed.  A drug-eluting stent was successfully placed using a STENT RESOLUTE ONYX 2.5X15, postdilated to greater than 3 mm.  Post intervention, there is a 0% residual stenosis.  LV end diastolic pressure is mildly elevated.  There is no aortic valve stenosis.  Temporary pacemaker placement was used. Heart rate improved after PCI of the RCA. The temporary pacer was removed.  Successful placement of the intra-aortic balloon pump.  Dual antiplatelet therapy for 12 months along with aggressive secondary prevention. Very late stent thrombosis of the RCA likely due to his antiplatelet therapy being stopped back in February. Second acute occlusion of the circumflex.   Transfer to [facility] given his intra-aortic balloon pump.  Diagnostic Dominance: Right    Intervention      Patient Profile     50 y.o. male with known CAD and remote stents to RCA, presenting with syncope due to high-grade AV block in the setting of acute inferoposterior STEMI due to simultaneous thrombotic occlusion of old RCA stent and native left circumflex coronary artery, treated with emergency PCI and transient intra-aortic balloon pump support cardiac catheterization.  Assessment & Plan    1. CAD s/p inf-post STEMI: unusual synchronous 2 vessel thrombotic occlusion. Occurred after 2 weeks off clopidogrel, but the RCA stent is 50 years old- formerly place in [employer]. Consider embolic event since he had transient atrial fibrillation, but he is young and LA not dilated.  Currently asymptomatic and hemodynamically stable. Per Dr. Irish Lack: "There appeared to be two acute vessel occlusions, the RCA and the circumflex.The cath showed late stent thrombosis of his RCA stent. Aspiration thrombectomy was performed followed by balloon angioplasty. TIMI-3 flow was restored. I elected not to stent the RCA given that the circumflex was still occluded and he remained hypotensive. It was not practical to  perform intravascular imaging at that point due to his condition so I was not sure if his stent was undersized and that was the mode of his very late stent thrombosis". On statin. 2. Cardiogenic shock:  Resolved.   3. AFib: transient, likely AMI related.  No recurrence since he came to [facility].  Will not restart IV heparin or oral anticoagulant for now.  For now, would avoid ASA+Brilinta+ DOAC. If he has recurrent AFib, would switch to clopidogrel+DOAC. 4. 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  No indication for pacemaker. 5. LV dysfunction- Acute on chronic combined systolic/diastolic CHF: EF 06%. No evidence of CHF clinically and no clear evidence for increased LA filling pressures on echo.  Remains abnormal with creatinine of 1.4 unchanged from yesterday (baseline around 0.9). Currently BP too low to consider RAAS inhibition. 6. AKI: May have some degree of renal injury from transient hypotension during his syncopal event and/or contrast nephrotoxicity.  Stable for now. Hold off RAAS inhibitors for now. 7. PAD: s/p R transmetatarsal amputation. 8. HLP: supposed to be on statin; LDL 136 (was 67 in February) suggests noncompliance. Will resume lipitor at higher dose of 80 mg daily. 9. Bilateral hand weakness:  Appreciate Neuro evaluation. MRI negative for acute CVA but some punctate ischemia in left brain. CT of cervical spine suggests C4-7 spinal stenosis but CT  not optimal. Will defer to Neuro. Could have Neurosurgery see but given recent MI and need for uninterrupted DAPT I don't think he can have surgery any time soon. OK to proceed with PT from our standpoint.  10. DM: on insulin. Poorly controlled, A1c 11.2 in February. Noncompliant with insulin therapy 11. Difficulty with urination. Will check UA.         For questions or updates, please contact [place] Please consult [email or web address] for contact info under        Signed, Grantley Savage [place], MD  01/22/2021, 8:40 AM

## 2021-01-22 NOTE — Progress Notes (Signed)
Tried scanning Pt's cervical at 0034 and Pt unable to follow command to hold still. Pt continued to move arms and lift legs up in the air as well as lift his head in and out of the head/neck coil. Unable to obtain diagnostic imagining from this pt at this time.

## 2021-01-23 ENCOUNTER — Other Ambulatory Visit (HOSPITAL_COMMUNITY): Payer: Self-pay

## 2021-01-23 ENCOUNTER — Inpatient Hospital Stay (HOSPITAL_COMMUNITY): Payer: Medicaid Other

## 2021-01-23 DIAGNOSIS — M4802 Spinal stenosis, cervical region: Secondary | ICD-10-CM

## 2021-01-23 DIAGNOSIS — I2111 ST elevation (STEMI) myocardial infarction involving right coronary artery: Secondary | ICD-10-CM | POA: Diagnosis present

## 2021-01-23 DIAGNOSIS — E1059 Type 1 diabetes mellitus with other circulatory complications: Secondary | ICD-10-CM

## 2021-01-23 DIAGNOSIS — E1065 Type 1 diabetes mellitus with hyperglycemia: Secondary | ICD-10-CM

## 2021-01-23 LAB — GLUCOSE, CAPILLARY
Glucose-Capillary: 151 mg/dL — ABNORMAL HIGH (ref 70–99)
Glucose-Capillary: 176 mg/dL — ABNORMAL HIGH (ref 70–99)
Glucose-Capillary: 150 mg/dL — ABNORMAL HIGH (ref 70–99)
Glucose-Capillary: 190 mg/dL — ABNORMAL HIGH (ref 70–99)

## 2021-01-23 LAB — CBC
HCT: 38.2 % — ABNORMAL LOW (ref 39.0–52.0)
Hemoglobin: 13 g/dL (ref 13.0–17.0)
MCH: 31.5 pg (ref 26.0–34.0)
MCHC: 34 g/dL (ref 30.0–36.0)
MCV: 92.5 fL (ref 80.0–100.0)
Platelets: 170 10*3/uL (ref 150–400)
RBC: 4.13 MIL/uL — ABNORMAL LOW (ref 4.22–5.81)
RDW: 12.7 % (ref 11.5–15.5)
WBC: 9 10*3/uL (ref 4.0–10.5)
nRBC: 0 % (ref 0.0–0.2)

## 2021-01-23 LAB — BASIC METABOLIC PANEL
Anion gap: 8 (ref 5–15)
BUN: 18 mg/dL (ref 6–20)
CO2: 26 mmol/L (ref 22–32)
Calcium: 8.3 mg/dL — ABNORMAL LOW (ref 8.9–10.3)
Chloride: 98 mmol/L (ref 98–111)
Creatinine, Ser: 1.14 mg/dL (ref 0.61–1.24)
GFR, Estimated: >60 mL/min (ref >60)
Glucose, Bld: 140 mg/dL — ABNORMAL HIGH (ref 70–99)
Potassium: 3.6 mmol/L (ref 3.5–5.1)
Sodium: 132 mmol/L — ABNORMAL LOW (ref 135–145)

## 2021-01-23 MED ORDER — GADOBUTROL 1 MMOL/ML IV SOLN
10.0000 mL | Freq: Once | INTRAVENOUS | Status: AC | PRN
  Administered 2021-01-23: 10 mL via INTRAVENOUS

## 2021-01-23 MED ORDER — HYDROMORPHONE HCL 1 MG/ML IJ SOLN
1.0000 mg | Freq: Once | INTRAMUSCULAR | Status: AC
  Administered 2021-01-23: 1 mg via INTRAVENOUS
  Filled 2021-01-23: qty 1

## 2021-01-23 MED ORDER — POTASSIUM CHLORIDE CRYS ER 20 MEQ PO TBCR
40.0000 meq | EXTENDED_RELEASE_TABLET | Freq: Once | ORAL | Status: AC
  Administered 2021-01-23: 40 meq via ORAL
  Filled 2021-01-23: qty 2

## 2021-01-23 MED ORDER — HYDROCODONE-ACETAMINOPHEN 5-325 MG PO TABS
1.0000 | ORAL_TABLET | Freq: Four times a day (QID) | ORAL | Status: DC | PRN
  Administered 2021-01-23 – 2021-02-05 (×40): 1 via ORAL
  Filled 2021-01-23 (×41): qty 1

## 2021-01-23 MED ORDER — METHOCARBAMOL 1000 MG/10ML IJ SOLN
750.0000 mg | Freq: Once | INTRAVENOUS | Status: AC
  Administered 2021-01-23: 750 mg via INTRAVENOUS
  Filled 2021-01-23: qty 7.5

## 2021-01-23 NOTE — Progress Notes (Signed)
Inpatient Rehabilitation Admissions Coordinator  Inpatient rehab consult received. I await MRI and follow up with Neurology before rehab assessment. I will follow up tomorrow.  Danne Baxter, RN, MSN Rehab Admissions Coordinator [phone] 01/23/2021 3:30 PM

## 2021-01-23 NOTE — Progress Notes (Signed)
Progress Note  Patient Name: [name] Date of Encounter: 01/23/2021  [facility] Cardiologist: None   Subjective   No angina.  No dyspnea.   He still complains of weak grip in both hands.  He is unable to fully flex his fingers and make a fist.  He also complains of reduced sensation in his left lower extremity- this has not changed.   Voiding OK.   Inpatient Medications    Scheduled Meds: . aspirin EC  81 mg Oral Daily  . atorvastatin  80 mg Oral Daily  . carvedilol  3.125 mg Oral BID WC  . Chlorhexidine Gluconate Cloth  6 each Topical Daily  . enoxaparin (LOVENOX) injection  40 mg Subcutaneous Q24H  . gabapentin  600 mg Oral BID  .  HYDROmorphone (DILAUDID) injection  1 mg Intravenous Once  . insulin aspart  0-15 Units Subcutaneous TID WC  . insulin aspart  6 Units Subcutaneous TID WC  . insulin glargine  30 Units Subcutaneous Daily  . ticagrelor  90 mg Oral BID   Continuous Infusions: . methocarbamol (ROBAXIN) IV     PRN Meds: acetaminophen, methocarbamol, nitroGLYCERIN, ondansetron (ZOFRAN) IV   Vital Signs    Vitals:   01/23/21 0500 01/23/21 0600 01/23/21 0700 01/23/21 0804  BP: 95/69 99/73 107/80 101/72  Pulse: 74 84  81  Resp: 10 11 15    Temp:      TempSrc:      SpO2: 91% 94% 92%     Intake/Output Summary (Last 24 hours) at 01/23/2021 0829 Last data filed at 01/23/2021 0700 Gross per 24 hour  Intake 450 ml  Output 1100 ml  Net -650 ml   Last 3 Weights 01/19/2021 11/02/2020 10/30/2020  Weight (lbs) 220 lb 220 lb 220 lb  Weight (kg) 99.791 kg 99.791 kg 99.791 kg      Telemetry    Sinus rhythm, no AV block or Afib- Personally Reviewed  ECG    None today- Personally Reviewed  Physical Exam   GEN: No acute distress.   Neck: No JVD Cardiac: RRR, no murmurs, rubs, or gallops.  Respiratory: Clear to auscultation bilaterally. GI: Soft, nontender, non-distended  MS: No edema; right foot transmetatarsal amputation.  Neuro:   PERRL/EOMI,  nonfocal cranial nerve exam, symmetrical face and expression, grip strength 4/5 bilaterally, slightly stronger in dominant right hand Psych: Normal affect   Labs    High Sensitivity Troponin:   Recent Labs  Lab 01/19/21 2014  TROPONINIHS 2,012*      Chemistry Recent Labs  Lab 01/19/21 2014 01/20/21 0320 01/21/21 0140 01/23/21 0128  NA 134* 130* 132* 132*  K 3.8 4.0 3.8 3.6  CL 102 99 102 98  CO2 21* 18* 24 26  GLUCOSE 392* 450* 215* 140*  BUN 11 19 22* 18  CREATININE 1.12 1.43* 1.42* 1.14  CALCIUM 8.3* 8.1* 8.3* 8.3*  PROT 6.7  --   --   --   ALBUMIN 2.9*  --   --   --   AST 36  --   --   --   ALT 34  --   --   --   ALKPHOS 162*  --   --   --   BILITOT 0.9  --   --   --   GFRNONAA >60 60* >60 >60  ANIONGAP 11 13 6 8      Hematology Recent Labs  Lab 01/21/21 0140 01/22/21 0733 01/23/21 0128  WBC 11.0* 9.5 9.0  RBC  4.26 4.34 4.13*  HGB 13.3 13.8 13.0  HCT 39.1 40.3 38.2*  MCV 91.8 92.9 92.5  MCH 31.2 31.8 31.5  MCHC 34.0 34.2 34.0  RDW 12.9 12.7 12.7  PLT 144* 143* 170    BNPNo results for input(s): BNP, PROBNP in the last 168 hours.   DDimer No results for input(s): DDIMER in the last 168 hours.   Radiology    CT CERVICAL SPINE WO CONTRAST  Result Date: 01/22/2021 CLINICAL DATA:  Progressive myelopathy.  Limited MRI examination. EXAM: CT CERVICAL SPINE WITHOUT CONTRAST TECHNIQUE: Multidetector CT imaging of the cervical spine was performed without intravenous contrast. Multiplanar CT image reconstructions were also generated. COMPARISON:  Limited attempted MRI examinations, same date. FINDINGS: Alignment: Normal Skull base and vertebrae: No bone lesions or fractures. The skull base C1 and C1-2 articulations are maintained. The dens is intact. Soft tissues and spinal canal: No evidence of canal hematoma or prevertebral soft tissue swelling. Disc levels:  C2-3: No significant findings. C3-4: Mild bulging annulus and mild uncinate spurring changes with mild  spinal and bilateral foraminal stenosis. C4-5: Degenerative disc disease with disc space narrowing and osteophytic spurring. Bulging annulus and shallow central disc protrusion along with uncinate spurring changes contributing to moderate spinal and bilateral foraminal stenosis. C5-6: Moderate to large broad-based disc protrusion asymmetric left with mass effect on the ventral thecal sac and obliteration of the ventral CSF space. No significant foraminal stenosis. C6-7: Shallow broad-based disc protrusion asymmetric left. No significant spurring changes or foraminal stenosis. C7-T1: Small central disc protrusion.  No foraminal stenosis. Upper chest: No significant findings. Other: No neck mass or adenopathy. The thyroid gland is grossly normal. IMPRESSION: 1. Normal alignment and no acute bony findings. 2. Multilevel disc disease as detailed above. 3. Moderate to large broad-based disc protrusion asymmetric left at C5-6 with mass effect on the ventral thecal sac and obliteration of the ventral CSF space. 4. Moderate spinal and bilateral foraminal stenosis at C4-5. 5. Mild spinal and bilateral foraminal stenosis at C3-4. 6. Shallow broad-based disc protrusion asymmetric left at C6-7. Electronically Signed   By: Marijo Sanes M.D.   On: 01/22/2021 11:26   MR ANGIO HEAD WO CONTRAST  Result Date: 01/21/2021 CLINICAL DATA:  Sudden loss of consciousness.  Recent STEMI. EXAM: MRI HEAD WITHOUT CONTRAST MRA HEAD WITHOUT CONTRAST TECHNIQUE: Multiplanar, multi-echo pulse sequences of the brain and surrounding structures were acquired without intravenous contrast. Angiographic images of the Circle of Willis were acquired using MRA technique without intravenous contrast. COMPARISON: No pertinent prior exam. COMPARISON:  No pertinent prior exam. FINDINGS: MRI HEAD FINDINGS Motion degraded study. Brain: There are 3 punctate foci of abnormal diffusion restriction, which are located in the left parietal lobe, left occipital lobe  and left cerebellum. No acute hemorrhage or mass effect. No acute or chronic hemorrhage. Normal white matter signal, parenchymal volume and CSF spaces. The midline structures are normal. Vascular: Major flow voids are preserved. Skull and upper cervical spine: Normal calvarium and skull base. Visualized upper cervical spine and soft tissues are normal. Sinuses/Orbits:No paranasal sinus fluid levels or advanced mucosal thickening. No mastoid or middle ear effusion. Normal orbits. MRA HEAD FINDINGS POSTERIOR CIRCULATION: --Vertebral arteries: Normal --Inferior cerebellar arteries: Normal. --Basilar artery: Normal. --Superior cerebellar arteries: Normal. --Posterior cerebral arteries: Normal. ANTERIOR CIRCULATION: --Intracranial internal carotid arteries: Normal. --Anterior cerebral arteries (ACA): Normal. --Middle cerebral arteries (MCA): Normal. ANATOMIC VARIANTS: None IMPRESSION: 1. Motion degraded examination. 2. 3 punctate foci of acute ischemia, within the left parietal lobe,  left occipital lobe and left cerebellum. No hemorrhage or mass effect. 3. Normal intracranial MRA. Electronically Signed   By: Ulyses Jarred M.D.   On: 01/21/2021 19:16   MR BRAIN WO CONTRAST  Result Date: 01/21/2021 CLINICAL DATA:  Sudden loss of consciousness.  Recent STEMI. EXAM: MRI HEAD WITHOUT CONTRAST MRA HEAD WITHOUT CONTRAST TECHNIQUE: Multiplanar, multi-echo pulse sequences of the brain and surrounding structures were acquired without intravenous contrast. Angiographic images of the Circle of Willis were acquired using MRA technique without intravenous contrast. COMPARISON: No pertinent prior exam. COMPARISON:  No pertinent prior exam. FINDINGS: MRI HEAD FINDINGS Motion degraded study. Brain: There are 3 punctate foci of abnormal diffusion restriction, which are located in the left parietal lobe, left occipital lobe and left cerebellum. No acute hemorrhage or mass effect. No acute or chronic hemorrhage. Normal white matter  signal, parenchymal volume and CSF spaces. The midline structures are normal. Vascular: Major flow voids are preserved. Skull and upper cervical spine: Normal calvarium and skull base. Visualized upper cervical spine and soft tissues are normal. Sinuses/Orbits:No paranasal sinus fluid levels or advanced mucosal thickening. No mastoid or middle ear effusion. Normal orbits. MRA HEAD FINDINGS POSTERIOR CIRCULATION: --Vertebral arteries: Normal --Inferior cerebellar arteries: Normal. --Basilar artery: Normal. --Superior cerebellar arteries: Normal. --Posterior cerebral arteries: Normal. ANTERIOR CIRCULATION: --Intracranial internal carotid arteries: Normal. --Anterior cerebral arteries (ACA): Normal. --Middle cerebral arteries (MCA): Normal. ANATOMIC VARIANTS: None IMPRESSION: 1. Motion degraded examination. 2. 3 punctate foci of acute ischemia, within the left parietal lobe, left occipital lobe and left cerebellum. No hemorrhage or mass effect. 3. Normal intracranial MRA. Electronically Signed   By: Ulyses Jarred M.D.   On: 01/21/2021 19:16   MR CERVICAL SPINE WO CONTRAST  Result Date: 01/22/2021 CLINICAL DATA:  Progressive myelopathy EXAM: MRI CERVICAL SPINE WITHOUT CONTRAST TECHNIQUE: Multiplanar, multisequence MR imaging of the cervical spine was performed. No intravenous contrast was administered. COMPARISON:  None. FINDINGS: Examination is severely degraded by motion. Only sagittal T2-weighted imaging was obtained. The cervical spinal canal appears severely stenotic at the C4-7 levels. IMPRESSION: 1. Severely motion degraded and truncated examination. 2. Apparent severe spinal canal stenosis at the C4-7 levels. CT of the cervical spine might be helpful, due to shorter scan time. Electronically Signed   By: Ulyses Jarred M.D.   On: 01/22/2021 03:29    Cardiac Studies   Echocardiogram 01/20/2021  1. Left ventricular ejection fraction, by estimation, is 40%. The left  ventricle has moderately decreased  function. The left ventricle  demonstrates regional wall motion abnormalities (see scoring  diagram/findings for description). Left ventricular  diastolic parameters are indeterminate. There is severe hypokinesis of the  left ventricular, entire inferior wall and inferolateral wall.  2. Right ventricular systolic function is mildly reduced. The right  ventricular size is normal. Tricuspid regurgitation signal is inadequate  for assessing PA pressure.  3. The mitral valve is normal in structure. No evidence of mitral valve  regurgitation.  4. The aortic valve is tricuspid. Aortic valve regurgitation is not  visualized. Mild aortic valve sclerosis is present, with no evidence of  aortic valve stenosis.  5. The inferior vena cava is dilated in size with <50% respiratory  variability, suggesting right atrial pressure of 15 mmHg.    CATH 01/19/2021   Mid RCA lesion is 100% stenosed- very late stent thrombosis. Aspiration thrombectomy was performed which restored TIMI-3 flow.  Balloon angioplasty was performed using a BALLOON [place] [id number].  Post intervention, there is a 0% residual  stenosis.  Mid Cx lesion is 100% stenosed.  A drug-eluting stent was successfully placed using a STENT RESOLUTE ONYX 2.5X15, postdilated to greater than 3 mm.  Post intervention, there is a 0% residual stenosis.  LV end diastolic pressure is mildly elevated.  There is no aortic valve stenosis.  Temporary pacemaker placement was used. Heart rate improved after PCI of the RCA. The temporary pacer was removed.  Successful placement of the intra-aortic balloon pump.  Dual antiplatelet therapy for 12 months along with aggressive secondary prevention. Very late stent thrombosis of the RCA likely due to his antiplatelet therapy being stopped back in February. Second acute occlusion of the circumflex.   Transfer to [facility] given his intra-aortic balloon pump.  Diagnostic Dominance:  Right    Intervention      Patient Profile     50 y.o. male with known CAD and remote stents to RCA, presenting with syncope due to high-grade AV block in the setting of acute inferoposterior STEMI due to simultaneous thrombotic occlusion of old RCA stent and native left circumflex coronary artery, treated with emergency PCI and transient intra-aortic balloon pump support cardiac catheterization.  Assessment & Plan    1. CAD s/p inf-post STEMI: unusual synchronous 2 vessel thrombotic occlusion. Occurred after 2 weeks off clopidogrel, but the RCA stent is 50 years old- formerly place in [employer]. Consider embolic event since he had transient atrial fibrillation, but he is young and LA not dilated.  Currently asymptomatic and hemodynamically stable. Per Dr. Irish Lack: "There appeared to be two acute vessel occlusions, the RCA and the circumflex.The cath showed late stent thrombosis of his RCA stent. Aspiration thrombectomy was performed followed by balloon angioplasty. TIMI-3 flow was restored. I elected not to stent the RCA given that the circumflex was still occluded and he remained hypotensive. It was not practical to perform intravascular imaging at that point due to his condition so I was not sure if his stent was undersized and that was the mode of his very late stent thrombosis". On statin. 2. Cardiogenic shock:  Resolved.  BP is still soft.  3. AFib: transient, likely AMI related.  No recurrence since he came to [facility].   For now, would avoid ASA+Brilinta+ DOAC. If he has recurrent AFib, would switch to clopidogrel+DOAC. 4. 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  No indication for pacemaker. Resolved.  5. LV dysfunction- Acute on chronic combined systolic/diastolic CHF: EF 32%. No evidence of CHF clinically and no clear evidence for increased LA filling pressures on echo. EDP 21-25 at time of cath but acutely decompensated then. Renal function improved.   Currently BP too low to consider RAAS inhibition. 6. AKI: May have some degree of renal injury from transient hypotension during his syncopal event and/or contrast nephrotoxicity.  Improved.  Hold off RAAS inhibitors for now due to low BP 7. PAD: s/p R transmetatarsal amputation. 8. HLP: supposed to be on statin; LDL 136 (was 67 in February) suggests noncompliance. Will resume lipitor at higher dose of 80 mg daily. 9. Bilateral hand weakness:  Appreciate Neuro evaluation. MRI negative for acute CVA but some punctate ischemia in left brain. CT of cervical spine suggests C4-7 spinal stenosis but CT not optimal. Will defer to Neuro. Plans to get MRI of C spine.  If evidence of compression could have Neurosurgery see but given recent MI and need for uninterrupted DAPT I don't think he can have surgery any time soon. OK to proceed with PT/OT from our  standpoint. Will consult inpatient Rehab. Plan to transfer to progressive care today. 10. DM: on insulin. Poorly controlled, A1c 11.2 in February. Now 10.4.  Noncompliant with insulin therapy 11. Difficulty with urination. UA negative for infection. Proteinuria due to uncontrolled DM.          For questions or updates, please contact [place] Please consult [email or web address] for contact info under        Signed, Tashala Cumbo [place], MD  01/23/2021, 8:29 AM

## 2021-01-23 NOTE — Progress Notes (Signed)
Pt was in a great deal of pain, even after given pain meds. Pt could not tolerated both cervical and thoracic spine exams. Pt consented to cervical w/wo only today. Thoracic TBD after 12 hour delay for contrast. For 01/24/21.

## 2021-01-23 NOTE — Plan of Care (Signed)
Discussed one time dose of Robaxin IV and Dilaudid prior to MRI with RN.  Clance Boll, NP Neurology

## 2021-01-23 NOTE — Progress Notes (Signed)
Brief Neurology Progress Note-no charge note  S: NP arrived when PT in room. PT states he is more strong than yesterday and can now lift his RUE.   Had intended on returning when patient's MRIs were resulted, but they are still pending at 1900 hours.   O: Current vital signs: BP 115/85   Pulse 79   Temp 98.1 F (36.7 C) (Oral)   Resp 14   SpO2 98%  Vital signs in last 24 hours: Temp:  [97.1 F (36.2 C)-98.1 F (36.7 C)] 98.1 F (36.7 C) (06/07 0800) Pulse Rate:  [72-92] 79 (06/07 1000) Resp:  [4-22] 14 (06/07 1000) BP: (80-119)/(62-96) 115/85 (06/07 1000) SpO2:  [91 %-98 %] 98 % (06/07 1000)  Medications  Current Facility-Administered Medications:  .  acetaminophen (TYLENOL) tablet 650 mg, 650 mg, Oral, Q4H PRN, Arps, Donald Siva, MD, 650 mg at 01/22/21 2119 .  aspirin EC tablet 81 mg, 81 mg, Oral, Daily, Arps, Donald Siva, MD, 81 mg at 01/23/21 0917 .  atorvastatin (LIPITOR) tablet 80 mg, 80 mg, Oral, Daily, [place], Peter M, MD, 80 mg at 01/23/21 0917 .  carvedilol (COREG) tablet 3.125 mg, 3.125 mg, Oral, BID WC, Croitoru, Mihai, MD, 3.125 mg at 01/23/21 0804 .  Chlorhexidine Gluconate Cloth 2 % PADS 6 each, 6 each, Topical, Daily, Arps, Donald Siva, MD, 6 each at 01/23/21 [phone] .  enoxaparin (LOVENOX) injection 40 mg, 40 mg, Subcutaneous, Q24H, [place], Peter M, MD, 40 mg at 01/23/21 0916 .  gabapentin (NEURONTIN) capsule 600 mg, 600 mg, Oral, BID, [place], Peter M, MD, 600 mg at 01/23/21 7939 .  HYDROmorphone (DILAUDID) injection 1 mg, 1 mg, Intravenous, Once, Kirby-Graham, Karsten Fells, NP .  insulin aspart (novoLOG) injection 0-15 Units, 0-15 Units, Subcutaneous, TID WC, Arps, Donald Siva, MD, 2 Units at 01/23/21 0805 .  insulin aspart (novoLOG) injection 6 Units, 6 Units, Subcutaneous, TID WC, Arps, Donald Siva, MD, 6 Units at 01/23/21 0805 .  insulin glargine (LANTUS) injection 30 Units, 30 Units, Subcutaneous, Daily, Arps, Donald Siva, MD, 30 Units at 01/23/21 0916 .  methocarbamol (ROBAXIN) 750 mg in  dextrose 5 % 50 mL IVPB, 750 mg, Intravenous, Once, Kirby-Graham, Karsten Fells, NP .  methocarbamol (ROBAXIN) tablet 750 mg, 750 mg, Oral, Q6H PRN, Arps, Donald Siva, MD, 750 mg at 01/22/21 2030 .  nitroGLYCERIN (NITROSTAT) SL tablet 0.4 mg, 0.4 mg, Sublingual, Q5 Min x 3 PRN, Arps, Kelly E, MD .  ondansetron (ZOFRAN) injection 4 mg, 4 mg, Intravenous, Q6H PRN, Arps, Donald Siva, MD .  ticagrelor (BRILINTA) tablet 90 mg, 90 mg, Oral, BID, Arps, Donald Siva, MD, 90 mg at 01/23/21 0300  Imaging MRI Cspine and Tspine to be done today. Discussed with MRI tech to get Cspine first, then Tspine in case patient can not tolerate length of exam.   Plan at this point, is to evaluate MRI cspine and tspine and see if there is anything to cause his myelopathy, such as cord compression. Will call [facility] to discuss if +. However, per cardiology note today, based on recent STEMI they feel patient should not stop his DAPT, so if he requires surgery, it may not be soon.    Clance Boll, MSN, APN-BC Nurse Practitioner, Neurology.

## 2021-01-23 NOTE — Progress Notes (Signed)
Physical Therapy Treatment Patient Details Name: [name] MRN: [id number] DOB: 03/06/1971 Today's Date: 01/23/2021    History of Present Illness 50 yo admitted 6/4 with STEMI with in stent thrombosis with Afib with RVR needing IABP. 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 CT: C5-6: Moderate to large broad-based disc protrusion asymmetric left  with mass effect on the ventral thecal sac and obliteration of the  ventral CSF space. PMhx: HTN, HLD, NASH cirrhosis, DM, neuropathy, cellulitis, Rt transmet    PT Comments    Pt pleasant and reports feeling discouraged with lack of mobility and present weakness. PT with improved movement of RUE and bil LE today. Pt with grip strength 2/5 bil UE, 2-/5 Rt elbow flexion without extensor tone present today. Pt remains with 2/5 left hip flexor strength but RLE improved to 3/5 knee flexion and extension today also without extensor tone. LLE extensor tone still present with plantarflexion and lack of ability to achieve neutral. Improved sitting balance and progression to RUE forearm propping. Pt educated for safety and that he does not need to attempt standing for weight with nursing and that mobility should be directed by therapy at this point. Will continue to follow and pt educated for HEp and positioning for pressure relief.   HR 90 93% on RA Pre activity 101/72 (82) Post 120/89 (99)   Follow Up Recommendations  CIR     Equipment Recommendations  Wheelchair cushion (measurements PT);Wheelchair (measurements PT);Hospital bed    Recommendations for Other Services Rehab consult     Precautions / Restrictions Precautions Precautions: Fall Precaution Comments: bil UE and LE weakness    Mobility  Bed Mobility Overal bed mobility: Needs Assistance Bed Mobility: Rolling;Sidelying to Sit;Sit to Sidelying Rolling: Max assist;+2 for safety/equipment Sidelying to sit: Max assist;+2 for  physical assistance     Sit to sidelying: Max assist;+2 for physical assistance General bed mobility comments: physical assist to bend left knee, reach across body and roll to right with assist to clear legs and elevate trunk. Return to sidelying with cues for propping onto RUE. Pt able to perform push to sit<>sidely x 5 with mod assist. Max +2 to fully control trunk to surface and elevate legs for return to bed. Total +2 to slide toward [facility]    Transfers                 General transfer comment: attempted lateral scooting at EOb with pad but pt unable to provide significant assist to shift sacrum laterally in sitting  Ambulation/Gait                 Stairs             Wheelchair Mobility    Modified Rankin (Stroke Patients Only)       Balance   Sitting-balance support: No upper extremity supported;Feet supported;Single extremity supported Sitting balance-Leahy Scale: Poor Sitting balance - Comments: pt with tendency for posterior left lean in unsupported sitting min assist, with LUE support minguard with pt EOB 11 min.                                    Cognition Arousal/Alertness: Awake/alert Behavior During Therapy: WFL for tasks assessed/performed Overall Cognitive Status: Within Functional Limits for tasks assessed  Exercises General Exercises - Upper Extremity Elbow Flexion: AAROM;Right;Seated;5 reps General Exercises - Lower Extremity Long Arc Quad: AROM;Both;Seated;10 reps Heel Slides: AAROM;Both;10 reps;Supine Hip Flexion/Marching: AAROM;Both;Seated;10 reps    General Comments        Pertinent Vitals/Pain Pain Score: 5  Pain Location: Rt shoulder, cervical and thoracic spine Pain Descriptors / Indicators: Aching;Constant;Sore Pain Intervention(s): Limited activity within patient's tolerance;Repositioned;Monitored during session    Home Living                       Prior Function            PT Goals (current goals can now be found in the care plan section) Progress towards PT goals: Progressing toward goals    Frequency    Min 4X/week      PT Plan Current plan remains appropriate    Co-evaluation              AM-PAC PT "6 Clicks" Mobility   Outcome Measure  Help needed turning from your back to your side while in a flat bed without using bedrails?: A Lot Help needed moving from lying on your back to sitting on the side of a flat bed without using bedrails?: Total Help needed moving to and from a bed to a chair (including a wheelchair)?: Total Help needed standing up from a chair using your arms (e.g., wheelchair or bedside chair)?: Total Help needed to walk in hospital room?: Total Help needed climbing 3-5 steps with a railing? : Total 6 Click Score: 7    End of Session   Activity Tolerance: Patient tolerated treatment well Patient left: in bed;with call bell/phone within reach;with nursing/sitter in room;with bed alarm set Nurse Communication: Mobility status;Need for lift equipment PT Visit Diagnosis: Other abnormalities of gait and mobility (R26.89);Muscle weakness (generalized) (M62.81);Other symptoms and signs involving the nervous system (R29.898)     Time: 2202-5427 PT Time Calculation (min) (ACUTE ONLY): 29 min  Charges:  $Therapeutic Activity: 23-37 mins                     Finnean Cerami P, PT Acute Rehabilitation Services Pager: [phone] Office: [phone]    Wilkie Zenon B Eythan Jayne 01/23/2021, 9:50 AM

## 2021-01-24 ENCOUNTER — Encounter: Payer: Self-pay | Admitting: Interventional Cardiology

## 2021-01-24 DIAGNOSIS — M4802 Spinal stenosis, cervical region: Secondary | ICD-10-CM

## 2021-01-24 DIAGNOSIS — Z720 Tobacco use: Secondary | ICD-10-CM

## 2021-01-24 DIAGNOSIS — G959 Disease of spinal cord, unspecified: Secondary | ICD-10-CM

## 2021-01-24 DIAGNOSIS — R29898 Other symptoms and signs involving the musculoskeletal system: Secondary | ICD-10-CM

## 2021-01-24 DIAGNOSIS — E7800 Pure hypercholesterolemia, unspecified: Secondary | ICD-10-CM

## 2021-01-24 DIAGNOSIS — E1159 Type 2 diabetes mellitus with other circulatory complications: Secondary | ICD-10-CM

## 2021-01-24 LAB — GLUCOSE, CAPILLARY
Glucose-Capillary: 128 mg/dL — ABNORMAL HIGH (ref 70–99)
Glucose-Capillary: 168 mg/dL — ABNORMAL HIGH (ref 70–99)
Glucose-Capillary: 145 mg/dL — ABNORMAL HIGH (ref 70–99)
Glucose-Capillary: 179 mg/dL — ABNORMAL HIGH (ref 70–99)

## 2021-01-24 LAB — CBC
HCT: 39.7 % (ref 39.0–52.0)
Hemoglobin: 13.7 g/dL (ref 13.0–17.0)
MCH: 31.9 pg (ref 26.0–34.0)
MCHC: 34.5 g/dL (ref 30.0–36.0)
MCV: 92.5 fL (ref 80.0–100.0)
Platelets: 175 10*3/uL (ref 150–400)
RBC: 4.29 MIL/uL (ref 4.22–5.81)
RDW: 12.8 % (ref 11.5–15.5)
WBC: 8 10*3/uL (ref 4.0–10.5)
nRBC: 0 % (ref 0.0–0.2)

## 2021-01-24 LAB — BASIC METABOLIC PANEL
Anion gap: 8 (ref 5–15)
BUN: 16 mg/dL (ref 6–20)
CO2: 24 mmol/L (ref 22–32)
Calcium: 8.6 mg/dL — ABNORMAL LOW (ref 8.9–10.3)
Chloride: 102 mmol/L (ref 98–111)
Creatinine, Ser: 1.06 mg/dL (ref 0.61–1.24)
GFR, Estimated: >60 mL/min (ref >60)
Glucose, Bld: 196 mg/dL — ABNORMAL HIGH (ref 70–99)
Potassium: 3.8 mmol/L (ref 3.5–5.1)
Sodium: 134 mmol/L — ABNORMAL LOW (ref 135–145)

## 2021-01-24 NOTE — Progress Notes (Signed)
Inpatient Rehabilitation Admissions Coordinator  I met at bedside with patient for rehab assessment. 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 He has no children, his sister works and he has no caregiver supports. Noted neuro work up ongoing with second MRI pending. Patient will need prolonged rehab recovery at [facility] rehab for he has limited caregiver supports available. CIR will not provide patient with the extended rehab that he will need to return home alone and be self sufficient. I will contact TOC team to discuss need for SNF rehab, disability and Medicaid applications as well.  Danne Baxter, RN, MSN Rehab Admissions Coordinator [phone] 01/24/2021 12:37 PM

## 2021-01-24 NOTE — Progress Notes (Signed)
Progress Note  Patient Name: [name] Date of Encounter: 01/24/2021  [facility] Cardiologist: None   Subjective   No angina.  No dyspnea.   He still complains of weak grip in both hands.  He also complains of reduced sensation in his left lower extremity- this has not changed.   Voiding OK.   Inpatient Medications    Scheduled Meds: . aspirin EC  81 mg Oral Daily  . atorvastatin  80 mg Oral Daily  . carvedilol  3.125 mg Oral BID WC  . Chlorhexidine Gluconate Cloth  6 each Topical Daily  . enoxaparin (LOVENOX) injection  40 mg Subcutaneous Q24H  . gabapentin  600 mg Oral BID  . insulin aspart  0-15 Units Subcutaneous TID WC  . insulin aspart  6 Units Subcutaneous TID WC  . insulin glargine  30 Units Subcutaneous Daily  . ticagrelor  90 mg Oral BID   Continuous Infusions:  PRN Meds: acetaminophen, HYDROcodone-acetaminophen, methocarbamol, nitroGLYCERIN, ondansetron (ZOFRAN) IV   Vital Signs    Vitals:   01/24/21 0600 01/24/21 0700 01/24/21 0800 01/24/21 0900  BP: 102/76 114/85 114/81 101/72  Pulse: 72 72 77 72  Resp: 17 14 (!) 25 12  Temp:      TempSrc:   Oral   SpO2: 96% 96% 95% 95%  Weight:        Intake/Output Summary (Last 24 hours) at 01/24/2021 1014 Last data filed at 01/24/2021 0700 Gross per 24 hour  Intake 590 ml  Output 3180 ml  Net -2590 ml   Last 3 Weights 01/24/2021 01/19/2021 11/02/2020  Weight (lbs) 217 lb 6 oz 220 lb 220 lb  Weight (kg) 98.6 kg 99.791 kg 99.791 kg      Telemetry    Sinus rhythm, no AV block or Afib- Personally Reviewed  ECG    None today- Personally Reviewed  Physical Exam   GEN: No acute distress.   Neck: No JVD Cardiac: RRR, no murmurs, rubs, or gallops.  Respiratory: Clear to auscultation bilaterally. GI: Soft, nontender, non-distended  MS: No edema; right foot transmetatarsal amputation.  Neuro:   PERRL/EOMI, nonfocal cranial nerve exam, symmetrical face and expression, grip strength 4/5 bilaterally,  slightly stronger in dominant right hand Psych: Normal affect   Labs    High Sensitivity Troponin:   Recent Labs  Lab 01/19/21 2014  TROPONINIHS 2,012*      Chemistry Recent Labs  Lab 01/19/21 2014 01/20/21 0320 01/21/21 0140 01/23/21 0128 01/24/21 0115  NA 134*   < > 132* 132* 134*  K 3.8   < > 3.8 3.6 3.8  CL 102   < > 102 98 102  CO2 21*   < > 24 26 24   GLUCOSE 392*   < > 215* 140* 196*  BUN 11   < > 22* 18 16  CREATININE 1.12   < > 1.42* 1.14 1.06  CALCIUM 8.3*   < > 8.3* 8.3* 8.6*  PROT 6.7  --   --   --   --   ALBUMIN 2.9*  --   --   --   --   AST 36  --   --   --   --   ALT 34  --   --   --   --   ALKPHOS 162*  --   --   --   --   BILITOT 0.9  --   --   --   --   GFRNONAA >60   < > >  60 >60 >60  ANIONGAP 11   < > 6 8 8    < > = values in this interval not displayed.     Hematology Recent Labs  Lab 01/22/21 0733 01/23/21 0128 01/24/21 0115  WBC 9.5 9.0 8.0  RBC 4.34 4.13* 4.29  HGB 13.8 13.0 13.7  HCT 40.3 38.2* 39.7  MCV 92.9 92.5 92.5  MCH 31.8 31.5 31.9  MCHC 34.2 34.0 34.5  RDW 12.7 12.7 12.8  PLT 143* 170 175    BNPNo results for input(s): BNP, PROBNP in the last 168 hours.   DDimer No results for input(s): DDIMER in the last 168 hours.   Radiology    CT CERVICAL SPINE WO CONTRAST  Result Date: 01/22/2021 CLINICAL DATA:  Progressive myelopathy.  Limited MRI examination. EXAM: CT CERVICAL SPINE WITHOUT CONTRAST TECHNIQUE: Multidetector CT imaging of the cervical spine was performed without intravenous contrast. Multiplanar CT image reconstructions were also generated. COMPARISON:  Limited attempted MRI examinations, same date. FINDINGS: Alignment: Normal Skull base and vertebrae: No bone lesions or fractures. The skull base C1 and C1-2 articulations are maintained. The dens is intact. Soft tissues and spinal canal: No evidence of canal hematoma or prevertebral soft tissue swelling. Disc levels:  C2-3: No significant findings. C3-4: Mild bulging  annulus and mild uncinate spurring changes with mild spinal and bilateral foraminal stenosis. C4-5: Degenerative disc disease with disc space narrowing and osteophytic spurring. Bulging annulus and shallow central disc protrusion along with uncinate spurring changes contributing to moderate spinal and bilateral foraminal stenosis. C5-6: Moderate to large broad-based disc protrusion asymmetric left with mass effect on the ventral thecal sac and obliteration of the ventral CSF space. No significant foraminal stenosis. C6-7: Shallow broad-based disc protrusion asymmetric left. No significant spurring changes or foraminal stenosis. C7-T1: Small central disc protrusion.  No foraminal stenosis. Upper chest: No significant findings. Other: No neck mass or adenopathy. The thyroid gland is grossly normal. IMPRESSION: 1. Normal alignment and no acute bony findings. 2. Multilevel disc disease as detailed above. 3. Moderate to large broad-based disc protrusion asymmetric left at C5-6 with mass effect on the ventral thecal sac and obliteration of the ventral CSF space. 4. Moderate spinal and bilateral foraminal stenosis at C4-5. 5. Mild spinal and bilateral foraminal stenosis at C3-4. 6. Shallow broad-based disc protrusion asymmetric left at C6-7. Electronically Signed   By: Marijo Sanes M.D.   On: 01/22/2021 11:26   MR CERVICAL SPINE W WO CONTRAST  Result Date: 01/23/2021 CLINICAL DATA:  Initial evaluation for spinal stenosis. EXAM: MRI CERVICAL SPINE WITHOUT AND WITH CONTRAST TECHNIQUE: Multiplanar and multiecho pulse sequences of the cervical spine, to include the craniocervical junction and cervicothoracic junction, were obtained without and with intravenous contrast. CONTRAST:  30m GADAVIST GADOBUTROL 1 MMOL/ML IV SOLN COMPARISON:  Prior CT from 01/22/2021. FINDINGS: Alignment: Examination technically limited as the patient was unable to tolerate the full length of the exam. Additionally, several sequences are fairly  degraded by motion artifact. Straightening of the normal cervical lordosis. Trace retrolisthesis of C4 on C5, chronic and degenerative. Vertebrae: Vertebral body height maintained without acute or chronic fracture. Bone marrow signal intensity within normal limits. No discrete or worrisome osseous lesions. No abnormal marrow edema. Cord: Patchy foci of cord signal abnormality seen involving the bilateral cord at the level of C4-5, C5-6, and likely C7-T1, most consistent with chronic myelomalacia. Appearance is most pronounced at the C4-5 level (series 5, image 9). A degree of underlying cord atrophy is suspected. No  definite abnormal enhancement. Posterior Fossa, vertebral arteries, paraspinal tissues: Visualized brain and posterior fossa within normal limits. Craniocervical junction normal. Paraspinous and prevertebral soft tissues within normal limits. Normal flow voids seen within the vertebral arteries bilaterally. Disc levels: Degree of underlying congenital spinal stenosis is present. C2-C3: Negative interspace. Left-sided facet hypertrophy. No canal or foraminal stenosis. C3-C4: Diffuse disc bulge with right greater than left uncovertebral hypertrophy. Mild left greater than right facet degeneration with mild ligament flavum thickening. Posterior disc osteophyte flattens and partially faces the ventral thecal sac, asymmetric to the right. Mild to moderate spinal stenosis. Moderate right worse than left C4 foraminal narrowing. C4-C5: Degenerative intervertebral disc space narrowing with diffuse disc osteophyte complex. Broad posterior component flattens and effaces the ventral thecal sac. Superimposed ligament flavum hypertrophy. Resultant severe spinal stenosis with associated cord flattening and cord signal changes, consistent with chronic myelomalacia. Thecal sac measures 5 mm in AP diameter at its most narrow point. Severe bilateral C5 foraminal stenosis. C5-C6: Broad-based posterior disc osteophyte  complex flattens and effaces the ventral thecal sac. Superimposed facet and ligament flavum hypertrophy. Changes superimposed on short pedicles resultant severe spinal stenosis. Associated cord flattening with cord signal changes consistent with myelomalacia. Thecal sac measures 4 mm in AP diameter at its most narrow point. Severe bilateral C6 foraminal narrowing. C6-C7: Disc bulge with bilateral uncovertebral hypertrophy. Broad-based posterior disc osteophyte effaces the ventral thecal sac. Superimposed mild facet and ligament flavum hypertrophy. Resultant mild to moderate spinal stenosis. Severe left with moderate right C7 foraminal stenosis. C7-T1: Mild disc bulge with uncovertebral hypertrophy. Superimposed central disc protrusion contacts and mildly flattens the ventral cord (series 8, image 36). Suspected subtle cord signal changes as above. Superimposed facet and ligament flavum hypertrophy. Moderate spinal stenosis. Mild to moderate bilateral C8 foraminal narrowing. Visualized upper thoracic spine demonstrates no significant finding. IMPRESSION: 1. 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 2. Patchy foci of cord signal abnormality involving the cord at C4-5, C5-6, and likely C7-T1, consistent with chronic myelomalacia. Appearance is most pronounced at the C4-5 level. 3. Multifactorial degenerative changes with resultant multilevel foraminal narrowing as above. Notable findings include moderate bilateral C4 foraminal stenosis, severe bilateral C5 and C6 foraminal narrowing, with severe left and moderate right C7 foraminal stenosis. Electronically Signed   By: Jeannine Boga M.D.   On: 01/23/2021 20:30    Cardiac Studies   Echocardiogram 01/20/2021  1. Left ventricular ejection fraction, by estimation, is 40%. The left  ventricle has moderately decreased function. The left ventricle  demonstrates regional wall motion abnormalities  (see scoring  diagram/findings for description). Left ventricular  diastolic parameters are indeterminate. There is severe hypokinesis of the  left ventricular, entire inferior wall and inferolateral wall.  2. Right ventricular systolic function is mildly reduced. The right  ventricular size is normal. Tricuspid regurgitation signal is inadequate  for assessing PA pressure.  3. The mitral valve is normal in structure. No evidence of mitral valve  regurgitation.  4. The aortic valve is tricuspid. Aortic valve regurgitation is not  visualized. Mild aortic valve sclerosis is present, with no evidence of  aortic valve stenosis.  5. The inferior vena cava is dilated in size with <50% respiratory  variability, suggesting right atrial pressure of 15 mmHg.    CATH 01/19/2021   Mid RCA lesion is 100% stenosed- very late stent thrombosis. Aspiration thrombectomy was performed which restored TIMI-3 flow.  Balloon angioplasty was performed using a  BALLOON Tiburones [place] RX [id number].  Post intervention, there is a 0% residual stenosis.  Mid Cx lesion is 100% stenosed.  A drug-eluting stent was successfully placed using a STENT RESOLUTE ONYX 2.5X15, postdilated to greater than 3 mm.  Post intervention, there is a 0% residual stenosis.  LV end diastolic pressure is mildly elevated.  There is no aortic valve stenosis.  Temporary pacemaker placement was used. Heart rate improved after PCI of the RCA. The temporary pacer was removed.  Successful placement of the intra-aortic balloon pump.  Dual antiplatelet therapy for 12 months along with aggressive secondary prevention. Very late stent thrombosis of the RCA likely due to his antiplatelet therapy being stopped back in February. Second acute occlusion of the circumflex.   Transfer to [facility] given his intra-aortic balloon pump.  Diagnostic Dominance: Right    Intervention      Patient Profile     51 y.o. male with  known CAD and remote stents to RCA, presenting with syncope due to high-grade AV block in the setting of acute inferoposterior STEMI due to simultaneous thrombotic occlusion of old RCA stent and native left circumflex coronary artery, treated with emergency PCI and transient intra-aortic balloon pump support cardiac catheterization.  Assessment & Plan    1. CAD s/p inf-post STEMI: unusual synchronous 2 vessel thrombotic occlusion. Occurred after 2 weeks off clopidogrel, but the RCA stent is 50 years old- formerly place in [employer]. Consider embolic event since he had transient atrial fibrillation, but he is young and LA not dilated.  Currently asymptomatic and hemodynamically stable. Per Dr. Irish Lack: "There appeared to be two acute vessel occlusions, the RCA and the circumflex.The cath showed late stent thrombosis of his RCA stent. Aspiration thrombectomy was performed followed by balloon angioplasty. TIMI-3 flow was restored. I elected not to stent the RCA given that the circumflex was still occluded and he remained hypotensive. It was not practical to perform intravascular imaging at that point due to his condition so I was not sure if his stent was undersized and that was the mode of his very late stent thrombosis". On statin. 2. Cardiogenic shock:  Resolved.  BP is still soft. On very low dose Coreg 3. AFib: transient, likely AMI related.  No recurrence since he came to [facility].  If he has recurrent AFib, would switch to clopidogrel+DOAC. 4. 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  No indication for pacemaker. Resolved.  5. LV dysfunction- Acute on chronic combined systolic/diastolic CHF: EF 82%. No evidence of CHF clinically and no clear evidence for increased LA filling pressures on echo. EDP 21-25 at time of cath but acutely decompensated then. Renal function improved.  Currently BP too low to consider RAAS inhibition. 6. AKI: May have some degree of renal injury from transient  hypotension during his syncopal event and/or contrast nephrotoxicity.  Improved.  Hold off RAAS inhibitors for now due to low BP 7. PAD: s/p R transmetatarsal amputation. 8. HLP: supposed to be on statin; LDL 136 (was 67 in February) suggests noncompliance. Will resume lipitor at higher dose of 80 mg daily. 9. Bilateral hand weakness:  Appreciate Neuro evaluation. MRI head negative for acute CVA but some punctate ischemia in left brain. CT of cervical spine suggests C4-7 spinal stenosis but CT not optimal. 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 May eventually need to consider surgery  but given recent MI and need for uninterrupted DAPT I don't think he can have surgery  any time soon. At a minimum would need uninterrupted DAPT for at least 6 months. OK to proceed with PT/OT from our standpoint. Inpatient Rehab consult in progress.  10. DM: on insulin. Poorly controlled, A1c 11.2 in February. Now 10.4.  Noncompliant with insulin therapy 11. Difficulty with urination. UA negative for infection. Proteinuria due to uncontrolled DM.     Patient is stable from a cardiac standpoint. If inpatient Rehab is planned he is stable from a cardiac standpoint to admit to Rehab service.      For questions or updates, please contact [place] Please consult [email or web address] for contact info under        Signed, Cherrill Scrima [place], MD  01/24/2021, 10:14 AM

## 2021-01-24 NOTE — Progress Notes (Signed)
CSW acknowledges consult for possible SNF placement for patient. Patient may be challenging to place due to insurance barrier. CSW will continue to follow for discharge planning.

## 2021-01-24 NOTE — Progress Notes (Signed)
Neurology Progress Note  Patient ID: Pt admitted with synchronous 2 vessel coronary occlusion including one late in-stent thrombosis s/p thrombectomy c/b cardiogenic shock requiring IABP. Neurology consulted for 5 days bilat L>R weakness, urinary retention, LLE numbness after procedure with intubation/neck extension.   S:  NAEON BUE weakness unchanged MRI c spine wwo contrast successfully performed and demonstrated multilevel severe canal and bilateral foraminal stenosis in the c spine  O: Current vital signs: BP 113/84 (BP Location: Right Arm)   Pulse 77   Temp 98.1 F (36.7 C) (Oral)   Resp 14   Wt 98.6 kg   SpO2 96%   BMI 27.91 kg/m  Vital signs in last 24 hours: Temp:  [98 F (36.7 C)-98.1 F (36.7 C)] 98.1 F (36.7 C) (06/08 2034) Pulse Rate:  [70-88] 77 (06/08 2000) Resp:  [5-25] 14 (06/08 2000) BP: (93-128)/(68-99) 113/84 (06/08 2000) SpO2:  [88 %-100 %] 96 % (06/08 2000) Weight:  [98.6 kg] 98.6 kg (06/08 0444)  GENERAL: Awake, alert in NAD HEENT: Normocephalic and atraumatic LUNGS: Normal respiratory effort.  CV: RRR on tele.  ABDOMEN: Soft, nontender Ext: warm  NEURO:  Mental Status: AA&Ox3  Speech/Language: speech is without aphasia or dysarthria.  Naming, repetition, fluency, and comprehension intact.  Cranial Nerves:  II: PERRL. Visual fields full.  III, IV, VI: EOMI. Eyelids elevate symmetrically.  V: Sensation is intact to light touch and symmetrical to face.  VII: Smile is symmetrical. Able to puff cheeks and raise eyebrows.  VIII: hearing intact to voice. IX, X: Palate elevates symmetrically. Phonation is normal.  XN:[id number] shrug 5/5. XII: tongue is midline without fasciculations. Motor:  Right deltoid 4-/5   Biceps 4-/5   Triceps 4/5   Grip 3+/5 Left deltoid 5/5   Biceps 4/5   Triceps 5/5   Grip 4-/5 Lower extremity strength is decreased in bilateral hip flexors.  He can not get his RUE to his mouth without use of his LUE.   Tone: is normal  and bulk is normal Sensation- Intact to light touch bilaterally to all 4 extremities. Extinction absent to light touch to DSS.    Light touch sensation is decreased from under the xiphoid process bilaterally to thighs. Tingling occurs when abdomen palpated.   Cold temperature decreased in the left thigh and below the knee except normal at the left calf/tibial area. Cold sensation is intact to RLE.  Cold temperature intact bilaterally to face and UEs.   Medications  Current Facility-Administered Medications:  .  acetaminophen (TYLENOL) tablet 650 mg, 650 mg, Oral, Q4H PRN, Martinique, Peter M, MD, 650 mg at 01/24/21 2014 .  aspirin EC tablet 81 mg, 81 mg, Oral, Daily, Martinique, Peter M, MD, 81 mg at 01/24/21 4496 .  atorvastatin (LIPITOR) tablet 80 mg, 80 mg, Oral, Daily, Martinique, Peter M, MD, 80 mg at 01/24/21 7591 .  carvedilol (COREG) tablet 3.125 mg, 3.125 mg, Oral, BID WC, Martinique, Peter M, MD, 3.125 mg at 01/24/21 1641 .  Chlorhexidine Gluconate Cloth 2 % PADS 6 each, 6 each, Topical, Daily, Martinique, Peter M, MD, 6 each at 01/24/21 [phone] .  enoxaparin (LOVENOX) injection 40 mg, 40 mg, Subcutaneous, Q24H, Martinique, Peter M, MD, 40 mg at 01/24/21 0917 .  gabapentin (NEURONTIN) capsule 600 mg, 600 mg, Oral, BID, [place]ique, Peter M, MD, 600 mg at 01/24/21 6659 .  HYDROcodone-acetaminophen (NORCO/VICODIN) 5-325 MG per tablet 1 tablet, 1 tablet, Oral, Q6H PRN, Jaci Lazier, MD, 1 tablet at 01/24/21 1735 .  insulin aspart (  novoLOG) injection 0-15 Units, 0-15 Units, Subcutaneous, TID WC, [place]ique, Peter M, MD, 2 Units at 01/24/21 1727 .  insulin aspart (novoLOG) injection 6 Units, 6 Units, Subcutaneous, TID WC, Martinique, Peter M, MD, 6 Units at 01/24/21 1728 .  insulin glargine (LANTUS) injection 30 Units, 30 Units, Subcutaneous, Daily, Martinique, Peter M, MD, 30 Units at 01/24/21 [phone] .  methocarbamol (ROBAXIN) tablet 750 mg, 750 mg, Oral, Q6H PRN, Martinique, Peter M, MD, 750 mg at 01/24/21 1922 .  nitroGLYCERIN  (NITROSTAT) SL tablet 0.4 mg, 0.4 mg, Sublingual, Q5 Min x 3 PRN, Martinique, Peter M, MD .  ondansetron [facility]) injection 4 mg, 4 mg, Intravenous, Q6H PRN, Martinique, Peter M, MD .  ticagrelor [facility]) tablet 90 mg, 90 mg, Oral, BID, Martinique, Peter M, MD, 90 mg at 01/24/21 7544   Assessment: 50 yo male admitted 2 days ago with STEMI following a syncopal event. He had 2 stents. Bilateral grip strength and decreased sensation to the LLE post operative.  Acute bilateral grip weakness and hand tingling with acutely decreased sensation of left lower extremity. MRI c spine wwo contrast successfully performed and demonstrated multilevel severe canal and bilateral foraminal stenosis in the c spine that explains his neurologic deficits. Unfortunately unable to safely hold DAPT for surgery at this time 2/2 tenuous cardiac status and recent multivessel occlusion in the setting of being off plavix x2 weeks. I called Dr. Zada Finders with [facility] who reviewed his imaging and recommended outpatient intervention. He kindly said that he will work with cardiology to identify appropriate timing of procedure in relation to his ongoing needs for antiplatelet therapy. I discussed all of this with patient and answered his questions; he is amenable to plan. - I will place outpatient referral to Dr. Zada Finders for c spine decompression and fusion when patient is able to safely undergo procedure - Continue PT/OT.  - Continue stroke prophylaxis with ASA and Brilinta per cardiology with consideration of DOAC plus Plavix with any further evidence of AF episodes.   Neurology will not continue to actively follow, but please re-engage if additional neurologic concerns arise.  Su Monks, MD [facility] [phone] If 7pm- 7am, please page neurology on call as listed in [place].

## 2021-01-24 NOTE — Progress Notes (Signed)
Physical Therapy Treatment Patient Details Name: [name] MRN: [id number] DOB: [birth year] Today's Date: 01/24/2021    History of Present Illness 50 yo admitted 6/4 with STEMI with in stent thrombosis with Afib with RVR needing IABP. 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 CT: C5-6: Moderate to large broad-based disc protrusion asymmetric left  with mass effect on the ventral thecal sac and obliteration of the  ventral CSF space. PMhx: HTN, HLD, NASH cirrhosis, DM, neuropathy, cellulitis, Rt transmet    PT Comments    Pt working slowly toward goals.  Emphasis on normalized movement to EOB, sitting balance, sit to stands into the RW and face to face transfer to the chair.  Initiated mobility with warm up ROM/stretching exercise.  Pt limited more by proprioceptive/sensation deficiencies than strength leading more to leg buckling and need for guarding/blocking.   Follow Up Recommendations  CIR     Equipment Recommendations  Wheelchair cushion (measurements PT);Wheelchair (measurements PT);Hospital bed    Recommendations for Other Services Rehab consult     Precautions / Restrictions Precautions Precautions: Fall Precaution Comments: bil UE and LE weakness Restrictions Weight Bearing Restrictions: No    Mobility  Bed Mobility Overal bed mobility: Needs Assistance Bed Mobility: Rolling;Sidelying to Sit Rolling: Max assist Sidelying to sit: Max assist (+2 not available)       General bed mobility comments: cues for direction/technique, assist for assist of LE's into a normalized position, truncal assist for roll and up from R elbow.    Transfers Overall transfer level: Needs assistance Equipment used: Rolling walker (2 wheeled);None Transfers: Sit to/from [facility] Sit to Stand: Max assist;From elevated surface (x2) Stand pivot transfers: Max assist       General transfer comment: cues for hand  placement, assist forward to overcome forward flexion insufficiency/stiffness.  Assist for forward/boost into the RW x2 and face to face assist for stand pivot.  Ambulation/Gait             General Gait Details: not safe yet.   Stairs             Wheelchair Mobility    Modified Rankin (Stroke Patients Only)       Balance Overall balance assessment: Needs assistance   Sitting balance-Leahy Scale: Poor Sitting balance - Comments: pt sitting EOB x6 min and between standing trials. Pt needing UE's to maintain midline biased posteriorly o/w lists back needing min assist.  Pt has trouble keeping bil feet in a functional standing position without moderate stability assist.   Standing balance support: During functional activity;Bilateral upper extremity supported Standing balance-Leahy Scale: Poor Standing balance comment: standing x2.  Work in the [employer] on full stance. with guarding or blocking R knee >L knee, Though R LE is the stronger of the 2 LE's, pt has little position sense of either knee.                            Cognition Arousal/Alertness: Awake/alert Behavior During Therapy: WFL for tasks assessed/performed Overall Cognitive Status: Within Functional Limits for tasks assessed                                        Exercises Other Exercises Other Exercises: Continued to encourage pt to use/move his arms as much as possible  when therapy is not working with him. He was able to raise RUE up on his own today for shoulder flexion to ~80 degrees while supine in bed, still intermittent difficulty with AROM elbow on right, he can now almost do composite finger flexion and extension without tenodesis. LUE shoulder 4/5, elbow 4/5, hand he still cannot oppose digits 4 and 5 on either hand ( he reports he could not do this before). Other Exercises: bil bicep tricep presses with AA to graded resistance x 10 reps    General Comments         Pertinent Vitals/Pain Pain Assessment: Faces Faces Pain Scale: Hurts whole lot Pain Location: RLE (spasms--leg lifting off of bed) Pain Descriptors / Indicators: Grimacing;Guarding;Spasm Pain Intervention(s): Limited activity within patient's tolerance;Monitored during session    Home Living Family/patient expects to be discharged to:: Inpatient rehab                    Prior Function            PT Goals (current goals can now be found in the care plan section) Acute Rehab PT Goals Patient Stated Goal: to get back to normal PT Goal Formulation: With patient Time For Goal Achievement: 02/05/21 Potential to Achieve Goals: Fair Progress towards PT goals: Progressing toward goals    Frequency    Min 4X/week      PT Plan Current plan remains appropriate    Co-evaluation              AM-PAC PT "6 Clicks" Mobility   Outcome Measure  Help needed turning from your back to your side while in a flat bed without using bedrails?: A Lot Help needed moving from lying on your back to sitting on the side of a flat bed without using bedrails?: Total Help needed moving to and from a bed to a chair (including a wheelchair)?: Total Help needed standing up from a chair using your arms (e.g., wheelchair or bedside chair)?: Total Help needed to walk in hospital room?: Total Help needed climbing 3-5 steps with a railing? : Total 6 Click Score: 7    End of Session   Activity Tolerance: Patient tolerated treatment well Patient left: in chair;with call bell/phone within reach;with chair alarm set Nurse Communication: Mobility status;Need for lift equipment PT Visit Diagnosis: Other abnormalities of gait and mobility (R26.89);Muscle weakness (generalized) (M62.81);Other symptoms and signs involving the nervous system (R29.898)     Time: 1057-1140 PT Time Calculation (min) (ACUTE ONLY): 43 min  Charges:  $Therapeutic Exercise: 8-22 mins $Therapeutic Activity: 23-37  mins                     01/24/2021  Ginger Carne., PT Acute Rehabilitation Services [phone]  (pager) [phone]  (office)   Tessie Fass Nyomi Howser 01/24/2021, 12:06 PM

## 2021-01-24 NOTE — Progress Notes (Signed)
Occupational Therapy Treatment Patient Details Name: [name] MRN: [id number] DOB: 1971/06/21 Today's Date: 01/24/2021    History of present illness 50 yo admitted 6/4 with STEMI with in stent thrombosis with Afib with RVR needing IABP. 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 CT: C5-6: Moderate to large broad-based disc protrusion asymmetric left  with mass effect on the ventral thecal sac and obliteration of the  ventral CSF space. PMhx: HTN, HLD, NASH cirrhosis, DM, neuropathy, cellulitis, Rt transmet   OT comments  This 50 yo showing progress with Bil UE movement and strength with RUE still more affected then LUE. He will continue to benefit from acute OT with follow up on CIR.  Follow Up Recommendations  CIR;Supervision/Assistance - 24 hour    Equipment Recommendations  Other (comment) (TBD next venue)       Precautions / Restrictions Precautions Precautions: Fall Precaution Comments: bil UE and LE weakness Restrictions Weight Bearing Restrictions: No              ADL either performed or assessed with clinical judgement   ADL Overall ADL's : Needs assistance/impaired Eating/Feeding: Minimal assistance;Bed level;With adaptive utensils;With assist to don/doff brace/orthosis Eating/Feeding Details (indicate cue type and reason): Pt reports he has been doing fairly well with self feeding. When I sat him up to show me with pillow on his lap (while in chair position in bed) he mentioned, "that is the one thing they haven't been doing"--I reminded him to tell staff and I had his current day nurse come in and showed him. He is doing better with using spoon with built up red handle on it, 2 handled cup seemed to be more of a challenge to him today but he managed to still drink from it.                                                   Cognition Arousal/Alertness: Awake/alert Behavior During Therapy:  WFL for tasks assessed/performed Overall Cognitive Status: Within Functional Limits for tasks assessed                                          Exercises Other Exercises Other Exercises: Continued to encourage pt to use/move his arms as much as possible when therapy is not working with him. He was able to raise RUE up on his own today for shoulder flexion to ~80 degrees while supine in bed, still intermittent difficulty with AROM elbow on right, he can now almost do composite finger flexion and extension without tenodesis. LUE shoulder 4/5, elbow 4/5, hand he still cannot oppose digits 4 and 5 on either hand ( he reports he could not do this before).           Pertinent Vitals/ Pain       Pain Assessment: Faces Faces Pain Scale: Hurts whole lot Pain Location: RLE (spasms--leg lifting off of bed) Pain Descriptors / Indicators: Grimacing;Guarding;Spasm Pain Intervention(s): Limited activity within patient's tolerance;Monitored during session  Home Living Family/patient expects to be discharged to:: Inpatient rehab  Frequency  Min 2X/week        Progress Toward Goals  OT Goals(current goals can now be found in the care plan section)  Progress towards OT goals: Progressing toward goals  Acute Rehab OT Goals Patient Stated Goal: to get back to normal OT Goal Formulation: With patient Time For Goal Achievement: 02/05/21 Potential to Achieve Goals: Good  Plan Discharge plan remains appropriate       AM-PAC OT "6 Clicks" Daily Activity     Outcome Measure   Help from another person eating meals?: A Little Help from another person taking care of personal grooming?: A Lot Help from another person toileting, which includes using toliet, bedpan, or urinal?: Total Help from another person bathing (including washing, rinsing, drying)?: A Lot Help from another person to put on and taking off regular upper  body clothing?: A Lot Help from another person to put on and taking off regular lower body clothing?: Total 6 Click Score: 11    End of Session    OT Visit Diagnosis: Other abnormalities of gait and mobility (R26.89);Muscle weakness (generalized) (M62.81);Pain;Other (comment) (quadraparesis) Pain - Right/Left: Right Pain - part of body: Leg   Activity Tolerance Patient tolerated treatment well   Patient Left in bed   Nurse Communication  (how to set pt up for more success with self feeding)        Time: 5784-6962 OT Time Calculation (min): 31 min  Charges: OT General Charges $OT Visit: 1 Visit OT Treatments $Self Care/Home Management : 23-37 mins  [address], OTR/L Acute [employer] Pager [phone] Office [phone]      Almon Register 01/24/2021, 11:57 AM

## 2021-01-24 NOTE — Plan of Care (Signed)

## 2021-01-24 NOTE — Progress Notes (Signed)
CSW sent an e-mail to [facility] Financial services to be screened for Medicaid.

## 2021-01-25 LAB — BASIC METABOLIC PANEL
Anion gap: 8 (ref 5–15)
BUN: 13 mg/dL (ref 6–20)
CO2: 25 mmol/L (ref 22–32)
Calcium: 8.6 mg/dL — ABNORMAL LOW (ref 8.9–10.3)
Chloride: 104 mmol/L (ref 98–111)
Creatinine, Ser: 0.9 mg/dL (ref 0.61–1.24)
GFR, Estimated: >60 mL/min (ref >60)
Glucose, Bld: 94 mg/dL (ref 70–99)
Potassium: 3.7 mmol/L (ref 3.5–5.1)
Sodium: 137 mmol/L (ref 135–145)

## 2021-01-25 LAB — CBC
HCT: 39.2 % (ref 39.0–52.0)
Hemoglobin: 13.4 g/dL (ref 13.0–17.0)
MCH: 31.5 pg (ref 26.0–34.0)
MCHC: 34.2 g/dL (ref 30.0–36.0)
MCV: 92 fL (ref 80.0–100.0)
Platelets: 195 10*3/uL (ref 150–400)
RBC: 4.26 MIL/uL (ref 4.22–5.81)
RDW: 12.8 % (ref 11.5–15.5)
WBC: 8.7 10*3/uL (ref 4.0–10.5)
nRBC: 0 % (ref 0.0–0.2)

## 2021-01-25 LAB — GLUCOSE, CAPILLARY
Glucose-Capillary: 129 mg/dL — ABNORMAL HIGH (ref 70–99)
Glucose-Capillary: 85 mg/dL (ref 70–99)
Glucose-Capillary: 142 mg/dL — ABNORMAL HIGH (ref 70–99)
Glucose-Capillary: 110 mg/dL — ABNORMAL HIGH (ref 70–99)

## 2021-01-25 MED ORDER — CARVEDILOL 6.25 MG PO TABS
6.2500 mg | ORAL_TABLET | Freq: Two times a day (BID) | ORAL | Status: DC
  Administered 2021-01-25 – 2021-02-05 (×22): 6.25 mg via ORAL
  Filled 2021-01-25 (×23): qty 1

## 2021-01-25 MED ORDER — PREGABALIN 75 MG PO CAPS
75.0000 mg | ORAL_CAPSULE | Freq: Two times a day (BID) | ORAL | Status: DC
  Administered 2021-01-25 – 2021-01-30 (×11): 75 mg via ORAL
  Filled 2021-01-25 (×12): qty 1

## 2021-01-25 MED ORDER — LOSARTAN POTASSIUM 25 MG PO TABS
12.5000 mg | ORAL_TABLET | Freq: Every day | ORAL | Status: DC
  Administered 2021-01-25 – 2021-02-05 (×11): 12.5 mg via ORAL
  Filled 2021-01-25 (×13): qty 1

## 2021-01-25 NOTE — Consult Note (Addendum)
[facility] Nurse Consult Note: Reason for Consult: Consult requested for right foot.  Pt had foot surgery in Feb for trans-metarsal area and this dehisced later, according to progress notes.  He has been followed y Dr Cleda Mccreedy of the podiatry service at [facility] and has been using Medihoney for topical treatment. This product is not available in the [place].  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  No odor, drainage, or fluctuance. Dressing procedure/placement/frequency: I will plan to substitute Xeroform since we do not have Medihoney. Topical treatment orders provided for bedside nurses to perform as follows to promote moist healing: apply double-folded xeroform gauze to right foot Q day, then cover with gauze and kerlex. Pt should plan to follow-up with podiatry after discharge. Please re-consult if further assistance is needed.  Thank-you,  Julien Girt MSN, [place], [place], [place], [place]

## 2021-01-25 NOTE — Progress Notes (Signed)
Progress Note  Patient Name: [name] Date of Encounter: 01/25/2021  [facility] Cardiologist: None   Subjective   No angina.  No dyspnea.  C/o sore butt   Inpatient Medications    Scheduled Meds:  aspirin EC  81 mg Oral Daily   atorvastatin  80 mg Oral Daily   carvedilol  3.125 mg Oral BID WC   Chlorhexidine Gluconate Cloth  6 each Topical Daily   enoxaparin (LOVENOX) injection  40 mg Subcutaneous Q24H   gabapentin  600 mg Oral BID   insulin aspart  0-15 Units Subcutaneous TID WC   insulin aspart  6 Units Subcutaneous TID WC   insulin glargine  30 Units Subcutaneous Daily   ticagrelor  90 mg Oral BID   Continuous Infusions:  PRN Meds: acetaminophen, HYDROcodone-acetaminophen, methocarbamol, nitroGLYCERIN, ondansetron (ZOFRAN) IV   Vital Signs    Vitals:   01/24/21 2209 01/25/21 0054 01/25/21 0500 01/25/21 0808  BP: 115/79 105/79 116/81 115/79  Pulse: 78 74 68 77  Resp: 19 18 20 20   Temp: 97.7 F (36.5 C) (!) 97.5 F (36.4 C) 97.7 F (36.5 C) 98.1 F (36.7 C)  TempSrc: Oral Oral Oral Oral  SpO2: 99% 94% 96% 100%  Weight:        Intake/Output Summary (Last 24 hours) at 01/25/2021 1015 Last data filed at 01/25/2021 0900 Gross per 24 hour  Intake 740 ml  Output 300 ml  Net 440 ml    Last 3 Weights 01/24/2021 01/19/2021 11/02/2020  Weight (lbs) 217 lb 6 oz 220 lb 220 lb  Weight (kg) 98.6 kg 99.791 kg 99.791 kg      Telemetry    Sinus rhythm, no AV block or Afib- Personally Reviewed  ECG    None today- Personally Reviewed  Physical Exam   GEN: No acute distress.   Neck: No JVD Cardiac: RRR, no murmurs, rubs, or gallops.  Respiratory: Clear to auscultation bilaterally. GI: Soft, nontender, non-distended  MS: No edema; right foot transmetatarsal amputation.  Neuro:   PERRL/EOMI, nonfocal cranial nerve exam, symmetrical face and expression, grip strength 4/5 bilaterally, slightly stronger in dominant right hand Psych: Normal affect   Labs     High Sensitivity Troponin:   Recent Labs  Lab 01/19/21 2014  TROPONINIHS 2,012*       Chemistry Recent Labs  Lab 01/19/21 2014 01/20/21 0320 01/23/21 0128 01/24/21 0115 01/25/21 0403  NA 134*   < > 132* 134* 137  K 3.8   < > 3.6 3.8 3.7  CL 102   < > 98 102 104  CO2 21*   < > 26 24 25   GLUCOSE 392*   < > 140* 196* 94  BUN 11   < > 18 16 13   CREATININE 1.12   < > 1.14 1.06 0.90  CALCIUM 8.3*   < > 8.3* 8.6* 8.6*  PROT 6.7  --   --   --   --   ALBUMIN 2.9*  --   --   --   --   AST 36  --   --   --   --   ALT 34  --   --   --   --   ALKPHOS 162*  --   --   --   --   BILITOT 0.9  --   --   --   --   GFRNONAA >60   < > >60 >60 >60  ANIONGAP 11   < >  8 8 8    < > = values in this interval not displayed.      Hematology Recent Labs  Lab 01/23/21 0128 01/24/21 0115 01/25/21 0403  WBC 9.0 8.0 8.7  RBC 4.13* 4.29 4.26  HGB 13.0 13.7 13.4  HCT 38.2* 39.7 39.2  MCV 92.5 92.5 92.0  MCH 31.5 31.9 31.5  MCHC 34.0 34.5 34.2  RDW 12.7 12.8 12.8  PLT 170 175 195     BNPNo results for input(s): BNP, PROBNP in the last 168 hours.   DDimer No results for input(s): DDIMER in the last 168 hours.   Radiology    MR CERVICAL SPINE W WO CONTRAST  Result Date: 01/23/2021 CLINICAL DATA:  Initial evaluation for spinal stenosis. EXAM: MRI CERVICAL SPINE WITHOUT AND WITH CONTRAST TECHNIQUE: Multiplanar and multiecho pulse sequences of the cervical spine, to include the craniocervical junction and cervicothoracic junction, were obtained without and with intravenous contrast. CONTRAST:  70m GADAVIST GADOBUTROL 1 MMOL/ML IV SOLN COMPARISON:  Prior CT from 01/22/2021. FINDINGS: Alignment: Examination technically limited as the patient was unable to tolerate the full length of the exam. Additionally, several sequences are fairly degraded by motion artifact. Straightening of the normal cervical lordosis. Trace retrolisthesis of C4 on C5, chronic and degenerative. Vertebrae: Vertebral body  height maintained without acute or chronic fracture. Bone marrow signal intensity within normal limits. No discrete or worrisome osseous lesions. No abnormal marrow edema. Cord: Patchy foci of cord signal abnormality seen involving the bilateral cord at the level of C4-5, C5-6, and likely C7-T1, most consistent with chronic myelomalacia. Appearance is most pronounced at the C4-5 level (series 5, image 9). A degree of underlying cord atrophy is suspected. No definite abnormal enhancement. Posterior Fossa, vertebral arteries, paraspinal tissues: Visualized brain and posterior fossa within normal limits. Craniocervical junction normal. Paraspinous and prevertebral soft tissues within normal limits. Normal flow voids seen within the vertebral arteries bilaterally. Disc levels: Degree of underlying congenital spinal stenosis is present. C2-C3: Negative interspace. Left-sided facet hypertrophy. No canal or foraminal stenosis. C3-C4: Diffuse disc bulge with right greater than left uncovertebral hypertrophy. Mild left greater than right facet degeneration with mild ligament flavum thickening. Posterior disc osteophyte flattens and partially faces the ventral thecal sac, asymmetric to the right. Mild to moderate spinal stenosis. Moderate right worse than left C4 foraminal narrowing. C4-C5: Degenerative intervertebral disc space narrowing with diffuse disc osteophyte complex. Broad posterior component flattens and effaces the ventral thecal sac. Superimposed ligament flavum hypertrophy. Resultant severe spinal stenosis with associated cord flattening and cord signal changes, consistent with chronic myelomalacia. Thecal sac measures 5 mm in AP diameter at its most narrow point. Severe bilateral C5 foraminal stenosis. C5-C6: Broad-based posterior disc osteophyte complex flattens and effaces the ventral thecal sac. Superimposed facet and ligament flavum hypertrophy. Changes superimposed on short pedicles resultant severe spinal  stenosis. Associated cord flattening with cord signal changes consistent with myelomalacia. Thecal sac measures 4 mm in AP diameter at its most narrow point. Severe bilateral C6 foraminal narrowing. C6-C7: Disc bulge with bilateral uncovertebral hypertrophy. Broad-based posterior disc osteophyte effaces the ventral thecal sac. Superimposed mild facet and ligament flavum hypertrophy. Resultant mild to moderate spinal stenosis. Severe left with moderate right C7 foraminal stenosis. C7-T1: Mild disc bulge with uncovertebral hypertrophy. Superimposed central disc protrusion contacts and mildly flattens the ventral cord (series 8, image 36). Suspected subtle cord signal changes as above. Superimposed facet and ligament flavum hypertrophy. Moderate spinal stenosis. Mild to moderate bilateral C8 foraminal narrowing. Visualized  upper thoracic spine demonstrates no significant finding. IMPRESSION: 1. 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 2. Patchy foci of cord signal abnormality involving the cord at C4-5, C5-6, and likely C7-T1, consistent with chronic myelomalacia. Appearance is most pronounced at the C4-5 level. 3. Multifactorial degenerative changes with resultant multilevel foraminal narrowing as above. Notable findings include moderate bilateral C4 foraminal stenosis, severe bilateral C5 and C6 foraminal narrowing, with severe left and moderate right C7 foraminal stenosis. Electronically Signed   By: Jeannine Boga M.D.   On: 01/23/2021 20:30     Cardiac Studies   Echocardiogram 01/20/2021   1. Left ventricular ejection fraction, by estimation, is 40%. The left  ventricle has moderately decreased function. The left ventricle  demonstrates regional wall motion abnormalities (see scoring  diagram/findings for description). Left ventricular  diastolic parameters are indeterminate. There is severe hypokinesis of the  left ventricular,  entire inferior wall and inferolateral wall.   2. Right ventricular systolic function is mildly reduced. The right  ventricular size is normal. Tricuspid regurgitation signal is inadequate  for assessing PA pressure.   3. The mitral valve is normal in structure. No evidence of mitral valve  regurgitation.   4. The aortic valve is tricuspid. Aortic valve regurgitation is not  visualized. Mild aortic valve sclerosis is present, with no evidence of  aortic valve stenosis.   5. The inferior vena cava is dilated in size with <50% respiratory  variability, suggesting right atrial pressure of 15 mmHg.     CATH 01/19/2021   Mid RCA lesion is 100% stenosed- very late stent thrombosis. Aspiration thrombectomy was performed which restored TIMI-3 flow. Balloon angioplasty was performed using a BALLOON [place] [id number]. Post intervention, there is a 0% residual stenosis. Mid Cx lesion is 100% stenosed. A drug-eluting stent was successfully placed using a STENT RESOLUTE ONYX 2.5X15, postdilated to greater than 3 mm. Post intervention, there is a 0% residual stenosis. LV end diastolic pressure is mildly elevated. There is no aortic valve stenosis. Temporary pacemaker placement was used. Heart rate improved after PCI of the RCA. The temporary pacer was removed. Successful placement of the intra-aortic balloon pump.   Dual antiplatelet therapy for 12 months along with aggressive secondary prevention.  Very late stent thrombosis of the RCA likely due to his antiplatelet therapy being stopped back in February.  Second acute occlusion of the circumflex.     Transfer to [facility] given his intra-aortic balloon pump.   Diagnostic Dominance: Right      Intervention         Patient Profile     50 y.o. male with known CAD and remote stents to RCA, presenting with syncope due to high-grade AV block in the setting of acute inferoposterior STEMI due to simultaneous thrombotic occlusion of old RCA  stent and native left circumflex coronary artery, treated with emergency PCI and transient intra-aortic balloon pump support cardiac catheterization.  Assessment & Plan    1. CAD s/p inf-post STEMI: unusual synchronous 2 vessel thrombotic occlusion. Occurred after 2 weeks off clopidogrel, but the RCA stent is 50 years old- formerly place in [employer]. Consider embolic event since he had transient atrial fibrillation, but he is young and LA not dilated.  Currently asymptomatic and hemodynamically stable. Per Dr. Irish Lack: "There appeared to be two acute vessel occlusions, the RCA and the circumflex. The cath showed late stent thrombosis of his RCA stent.  Aspiration  thrombectomy was performed followed by balloon angioplasty.  TIMI-3 flow was restored.  I elected not to stent the RCA given that the circumflex was still occluded and he remained hypotensive.  It was not practical to perform intravascular imaging at that point due to his condition so I was not sure if his stent was undersized and that was the mode of his very late stent thrombosis". On statin. 2. Cardiogenic shock:  Resolved.   3. AFib: transient, likely AMI related.  No recurrence since he came to [facility].  If he has recurrent AFib, would switch to clopidogrel+DOAC. 4. 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  No indication for pacemaker. Resolved.  5. LV dysfunction- Acute on chronic combined systolic/diastolic CHF: EF 92%. No evidence of CHF clinically and no clear evidence for increased LA filling pressures on echo. EDP 21-25 at time of cath but acutely decompensated then. Renal function improved.  BP is improved. Renal function is normal. Will initiate losartan today. Increase Coreg.  6. AKI: May have some degree of renal injury from transient hypotension during his syncopal event and/or contrast nephrotoxicity.  Renal function is back to normal. 7. PAD: s/p R transmetatarsal amputation. 8. HLP: supposed to be on statin;  LDL 136 (was 67 in February) suggests noncompliance. Will resume lipitor at higher dose of 80 mg daily. 9. Bilateral hand weakness:  Appreciate Neuro evaluation. Significant cervical spine stenosis and foraminal narrowing. May eventually need to consider surgery  but given recent MI and need for uninterrupted DAPT I don't think he can have surgery any time soon. At a minimum would need uninterrupted DAPT for at least 6 months. OK to proceed with PT/OT from our standpoint. Not felt to be a candidate for inpatient Rehab. Will initiate search for SNF bed.  10. DM: on insulin. Poorly controlled, A1c 11.2 in February. Now 10.4.  Noncompliant with insulin therapy     Patient is stable from a cardiac standpoint. Plan DC to SNF when be available.     For questions or updates, please contact [place] Please consult [email or web address] for contact info under        Signed, Norissa Bartee [place], MD  01/25/2021, 10:15 AM

## 2021-01-25 NOTE — Progress Notes (Signed)
Physical Therapy Treatment Patient Details Name: [name] MRN: [id number] DOB: [birth year] Today's Date: 01/25/2021    History of Present Illness 50 yo admitted 6/4 with STEMI with in stent thrombosis with Afib with RVR needing IABP. 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 CT: C5-6: Moderate to large broad-based disc protrusion asymmetric left  with mass effect on the ventral thecal sac and obliteration of the  ventral CSF space. PMhx: HTN, HLD, NASH cirrhosis, DM, neuropathy, cellulitis, Rt transmet    PT Comments    Patient progressing towards goals. Patient continues to be limited by B LE weakness, impaired coordination/proprioception, decreased activity tolerance, and impaired balance. Patient eager to participate with therapy. Patient able to take pivotal steps towards recliner on R with 2 person "3 musketeer" hold to facilitate lateral weight and hip extension. Patient became tearful at end of session due to current situation and wanting to get better. Updated recommendation to SNF as patient does not have caregiver support at discharge to qualify for CIR.     Follow Up Recommendations  SNF     Equipment Recommendations  Wheelchair cushion (measurements PT);Wheelchair (measurements PT);Hospital bed    Recommendations for Other Services       Precautions / Restrictions Precautions Precautions: Fall Precaution Comments: bil UE and LE weakness Restrictions Weight Bearing Restrictions: No    Mobility  Bed Mobility Overal bed mobility: Needs Assistance Bed Mobility: Rolling;Sidelying to Sit Rolling: Max assist Sidelying to sit: Max assist;+2 for physical assistance;+2 for safety/equipment       General bed mobility comments: assist for flexing LEs due to extensor tone. MaxA+2 for trunk elevation and repositioning hips towards EOB    Transfers Overall transfer level: Needs assistance Equipment used: 2 person hand  held assist Transfers: Sit to/from [facility] Sit to Stand: Max assist;+2 physical assistance;+2 safety/equipment;From elevated surface Stand pivot transfers: Max assist;+2 physical assistance;+2 safety/equipment;From elevated surface       General transfer comment: 2 person "3 musketeer" hold to faciliate latera weight shift and hip extension. Assist required for forward trunk flexion to initiate sit to stand. MaxA+2 for completing stand pivot transfer  Ambulation/Gait                 Stairs             Wheelchair Mobility    Modified Rankin (Stroke Patients Only)       Balance Overall balance assessment: Needs assistance Sitting-balance support: Single extremity supported;Feet supported Sitting balance-Leahy Scale: Poor Sitting balance - Comments: required min-modA to maintain sitting balance due to posterior bias Postural control: Posterior lean Standing balance support: During functional activity;Bilateral upper extremity supported Standing balance-Leahy Scale: Poor Standing balance comment: maxA+2 to maintain standing balance                            Cognition Arousal/Alertness: Awake/alert Behavior During Therapy: WFL for tasks assessed/performed Overall Cognitive Status: Within Functional Limits for tasks assessed                                        Exercises General Exercises - Lower Extremity Long Arc Quad: AROM;Both;Seated;10 reps Other Exercises Other Exercises: chair crunches with use of B UEs to assist with trunk flexion    General Comments  Pertinent Vitals/Pain Pain Assessment: Faces Faces Pain Scale: Hurts even more Pain Location: low back (spasm in standing) Pain Descriptors / Indicators: Grimacing;Guarding;Spasm Pain Intervention(s): Monitored during session;Repositioned    Home Living                      Prior Function            PT Goals (current goals can  now be found in the care plan section) Acute Rehab PT Goals Patient Stated Goal: to get back to normal PT Goal Formulation: With patient Time For Goal Achievement: 02/05/21 Potential to Achieve Goals: Fair Progress towards PT goals: Progressing toward goals    Frequency    Min 3X/week      PT Plan Discharge plan needs to be updated;Frequency needs to be updated    Co-evaluation              AM-PAC PT "6 Clicks" Mobility   Outcome Measure  Help needed turning from your back to your side while in a flat bed without using bedrails?: A Lot Help needed moving from lying on your back to sitting on the side of a flat bed without using bedrails?: Total Help needed moving to and from a bed to a chair (including a wheelchair)?: Total Help needed standing up from a chair using your arms (e.g., wheelchair or bedside chair)?: Total Help needed to walk in hospital room?: Total Help needed climbing 3-5 steps with a railing? : Total 6 Click Score: 7    End of Session Equipment Utilized During Treatment: Gait belt Activity Tolerance: Patient tolerated treatment well Patient left: in chair;with call bell/phone within reach;with chair alarm set Nurse Communication: Mobility status;Need for lift equipment PT Visit Diagnosis: Other abnormalities of gait and mobility (R26.89);Muscle weakness (generalized) (M62.81);Other symptoms and signs involving the nervous system (Q33.007)     Time: 6226-3335 PT Time Calculation (min) (ACUTE ONLY): 32 min  Charges:  $Therapeutic Activity: 23-37 mins                     [name] PT, DPT Acute Rehabilitation Services Pager [phone] Office [phone]    Linna Hoff 01/25/2021, 12:51 PM

## 2021-01-26 LAB — GLUCOSE, CAPILLARY
Glucose-Capillary: 116 mg/dL — ABNORMAL HIGH (ref 70–99)
Glucose-Capillary: 103 mg/dL — ABNORMAL HIGH (ref 70–99)
Glucose-Capillary: 120 mg/dL — ABNORMAL HIGH (ref 70–99)
Glucose-Capillary: 134 mg/dL — ABNORMAL HIGH (ref 70–99)
Glucose-Capillary: 129 mg/dL — ABNORMAL HIGH (ref 70–99)

## 2021-01-26 NOTE — Progress Notes (Signed)
Physical Therapy Treatment Patient Details Name: [name] MRN: [id number] DOB: 11/24/1970 Today's Date: 01/26/2021    History of Present Illness 50 yo admitted 6/4 with STEMI with in stent thrombosis with Afib with RVR needing IABP. 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 CT: C5-6: Moderate to large broad-based disc protrusion asymmetric left  with mass effect on the ventral thecal sac and obliteration of the  ventral CSF space. PMhx: HTN, HLD, NASH cirrhosis, DM, neuropathy, cellulitis, Rt transmet that has dressing on it.    PT Comments    Seen in conjunction with OT to maximize activity tolerance. Patient performed sit to stand x 4 with minA+2-maxA+2 progressively with fatigue. Initiated gait training with stepping standing with bed rail but unable to raise L LE fully off ground. Patient with back spasms when attempted upright posture in standing. Continue to recommend SNF for ongoing Physical Therapy.       Follow Up Recommendations  SNF     Equipment Recommendations  Wheelchair cushion (measurements PT);Wheelchair (measurements PT);Hospital bed    Recommendations for Other Services       Precautions / Restrictions Precautions Precautions: Fall Precaution Comments: bil UE and LE weakness Restrictions Weight Bearing Restrictions: No    Mobility  Bed Mobility               General bed mobility comments: Pt up in recliner upon arrival    Transfers Overall transfer level: Needs assistance Equipment used: 2 person hand held assist Transfers: Sit to/from Stand Sit to Stand: Min assist;Mod assist;Max assist;+2 physical assistance         General transfer comment: From recliner with pt pulling up on bed rail he was Min A+2 x2 stands, Mod A+2 x1 stand, and Max A+2 the last stand with VCs for upright posture (reported he would have spasms in low back when he tried to stand up straight), stood each of the 4  times 1-1.5 minutes.  Ambulation/Gait                 Stairs             Wheelchair Mobility    Modified Rankin (Stroke Patients Only)       Balance Overall balance assessment: Needs assistance Sitting-balance support: Bilateral upper extremity supported;Feet supported Sitting balance-Leahy Scale: Poor Sitting balance - Comments: requires hands on bed rail in front of him to maintain sitting forward off of back of recliner Postural control: Posterior lean Standing balance support: Bilateral upper extremity supported Standing balance-Leahy Scale: Poor Standing balance comment: min-Mod A +2 to maintain standing with hands on bedrail with right lateral lean                            Cognition Arousal/Alertness: Awake/alert Behavior During Therapy: WFL for tasks assessed/performed Overall Cognitive Status: Within Functional Limits for tasks assessed                                        Exercises      General Comments        Pertinent Vitals/Pain Pain Assessment: Faces Faces Pain Scale: Hurts even more Pain Location: low back (spasm in standing) Pain Descriptors / Indicators: Grimacing;Guarding;Spasm Pain Intervention(s): Monitored during session;Repositioned    Home Living  Prior Function            PT Goals (current goals can now be found in the care plan section) Acute Rehab PT Goals Patient Stated Goal: to get back to normal PT Goal Formulation: With patient Time For Goal Achievement: 02/05/21 Potential to Achieve Goals: Fair Progress towards PT goals: Progressing toward goals    Frequency    Min 3X/week      PT Plan Current plan remains appropriate    Co-evaluation PT/OT/SLP Co-Evaluation/Treatment: Yes Reason for Co-Treatment: For patient/therapist safety;To address functional/ADL transfers PT goals addressed during session: Mobility/safety with  mobility;Balance;Strengthening/ROM OT goals addressed during session: Strengthening/ROM      AM-PAC PT "6 Clicks" Mobility   Outcome Measure  Help needed turning from your back to your side while in a flat bed without using bedrails?: A Lot Help needed moving from lying on your back to sitting on the side of a flat bed without using bedrails?: Total Help needed moving to and from a bed to a chair (including a wheelchair)?: Total Help needed standing up from a chair using your arms (e.g., wheelchair or bedside chair)?: Total Help needed to walk in hospital room?: Total Help needed climbing 3-5 steps with a railing? : Total 6 Click Score: 7    End of Session Equipment Utilized During Treatment: Gait belt Activity Tolerance: Patient tolerated treatment well Patient left: in chair;with call bell/phone within reach;with chair alarm set Nurse Communication: Mobility status;Need for lift equipment PT Visit Diagnosis: Other abnormalities of gait and mobility (R26.89);Muscle weakness (generalized) (M62.81);Other symptoms and signs involving the nervous system (E42.353)     Time: 6144-3154 PT Time Calculation (min) (ACUTE ONLY): 53 min  Charges:  $Therapeutic Activity: 23-37 mins                     Lang Zingg A. Gilford Rile PT, DPT Acute Rehabilitation Services Pager [phone] Office [phone]    Linna Hoff 01/26/2021, 5:53 PM

## 2021-01-26 NOTE — Plan of Care (Signed)
Brief Neurology Note  Pt c/o uncomfortable paresthesias BUE. No benefit from gabapentin 633m bid. Since surgery will be deferred 2/2 need for ongoing DAPT, will switch patient to lyrica for improved pain control with less sedation.  - D/c gabapentin - Start lyrica 710mbid. May increase in increments up to 15020may in 2-3 divided doses at weekly intervals as needed for pain mgmt up to max dose 600m54my in 2-3 divided doses  Please contact neurology with any questions.  [facility] [phone] 7pm- 7am, please page neurology on call as listed in [facility]

## 2021-01-26 NOTE — Progress Notes (Signed)
Progress Note  Patient Name: [name] Date of Encounter: 01/26/2021  [facility] Cardiologist: Larae Grooms, MD   Subjective   No chest pain or shortness of breath.  Feeling fatigued but improving.  Inpatient Medications    Scheduled Meds:  aspirin EC  81 mg Oral Daily   atorvastatin  80 mg Oral Daily   carvedilol  6.25 mg Oral BID WC   Chlorhexidine Gluconate Cloth  6 each Topical Daily   enoxaparin (LOVENOX) injection  40 mg Subcutaneous Q24H   insulin aspart  0-15 Units Subcutaneous TID WC   insulin aspart  6 Units Subcutaneous TID WC   insulin glargine  30 Units Subcutaneous Daily   losartan  12.5 mg Oral Daily   pregabalin  75 mg Oral BID   ticagrelor  90 mg Oral BID   Continuous Infusions:  PRN Meds: acetaminophen, HYDROcodone-acetaminophen, methocarbamol, nitroGLYCERIN, ondansetron (ZOFRAN) IV   Vital Signs    Vitals:   01/25/21 2334 01/26/21 0603 01/26/21 0746 01/26/21 1036  BP: 93/65 97/66 108/78 118/81  Pulse: 68 65 70 89  Resp: 20 18 16    Temp: 97.8 F (36.6 C) (!) 97.5 F (36.4 C) 97.6 F (36.4 C)   TempSrc: Oral Oral Oral   SpO2: 95% 96% 98%   Weight:        Intake/Output Summary (Last 24 hours) at 01/26/2021 1045 Last data filed at 01/26/2021 0015 Gross per 24 hour  Intake 560 ml  Output 1050 ml  Net -490 ml   Last 3 Weights 01/24/2021 01/19/2021 11/02/2020  Weight (lbs) 217 lb 6 oz 220 lb 220 lb  Weight (kg) 98.6 kg 99.791 kg 99.791 kg      Telemetry    - SR  Personally Reviewed  ECG    N/A  Physical Exam   GEN: No acute distress.   Neck: No JVD Cardiac: RRR, no murmurs, rubs, or gallops.  Respiratory: Clear to auscultation bilaterally. GI: Soft, nontender, non-distended  MS: No edema;  right foot transmetatarsal amputation. Neuro:  Nonfocal  Psych: Normal affect   Labs    High Sensitivity Troponin:   Recent Labs  Lab 01/19/21 2014  TROPONINIHS 2,012*      Chemistry Recent Labs  Lab 01/19/21 2014  01/20/21 0320 01/23/21 0128 01/24/21 0115 01/25/21 0403  NA 134*   < > 132* 134* 137  K 3.8   < > 3.6 3.8 3.7  CL 102   < > 98 102 104  CO2 21*   < > 26 24 25   GLUCOSE 392*   < > 140* 196* 94  BUN 11   < > 18 16 13   CREATININE 1.12   < > 1.14 1.06 0.90  CALCIUM 8.3*   < > 8.3* 8.6* 8.6*  PROT 6.7  --   --   --   --   ALBUMIN 2.9*  --   --   --   --   AST 36  --   --   --   --   ALT 34  --   --   --   --   ALKPHOS 162*  --   --   --   --   BILITOT 0.9  --   --   --   --   GFRNONAA >60   < > >60 >60 >60  ANIONGAP 11   < > 8 8 8    < > = values in this interval not displayed.  Hematology Recent Labs  Lab 01/23/21 0128 01/24/21 0115 01/25/21 0403  WBC 9.0 8.0 8.7  RBC 4.13* 4.29 4.26  HGB 13.0 13.7 13.4  HCT 38.2* 39.7 39.2  MCV 92.5 92.5 92.0  MCH 31.5 31.9 31.5  MCHC 34.0 34.5 34.2  RDW 12.7 12.8 12.8  PLT 170 175 195   Radiology    No results found.  Cardiac Studies   Echocardiogram 01/20/2021    1. Left ventricular ejection fraction, by estimation, is 40%. The left  ventricle has moderately decreased function. The left ventricle  demonstrates regional wall motion abnormalities (see scoring  diagram/findings for description). Left ventricular  diastolic parameters are indeterminate. There is severe hypokinesis of the  left ventricular, entire inferior wall and inferolateral wall.   2. Right ventricular systolic function is mildly reduced. The right  ventricular size is normal. Tricuspid regurgitation signal is inadequate  for assessing PA pressure.   3. The mitral valve is normal in structure. No evidence of mitral valve  regurgitation.   4. The aortic valve is tricuspid. Aortic valve regurgitation is not  visualized. Mild aortic valve sclerosis is present, with no evidence of  aortic valve stenosis.   5. The inferior vena cava is dilated in size with <50% respiratory  variability, suggesting right atrial pressure of 15 mmHg.     CATH 01/19/2021    Mid RCA lesion is 100% stenosed- very late stent thrombosis. Aspiration thrombectomy was performed which restored TIMI-3 flow. Balloon angioplasty was performed using a BALLOON [place] [id number]. Post intervention, there is a 0% residual stenosis. Mid Cx lesion is 100% stenosed. A drug-eluting stent was successfully placed using a STENT RESOLUTE ONYX 2.5X15, postdilated to greater than 3 mm. Post intervention, there is a 0% residual stenosis. LV end diastolic pressure is mildly elevated. There is no aortic valve stenosis. Temporary pacemaker placement was used. Heart rate improved after PCI of the RCA. The temporary pacer was removed. Successful placement of the intra-aortic balloon pump.   Dual antiplatelet therapy for 12 months along with aggressive secondary prevention.  Very late stent thrombosis of the RCA likely due to his antiplatelet therapy being stopped back in February.  Second acute occlusion of the circumflex.     Transfer to [facility] given his intra-aortic balloon pump.   Diagnostic Dominance: Right      Intervention           Patient Profile     50 y.o. male with known CAD s/p remote stents to RCA, presenting with syncope due to high-grade AV block in the setting of acute inferoposterior STEMI due to simultaneous thrombotic occlusion of old RCA stent and native left circumflex coronary artery, treated with emergency PCI and transient intra-aortic balloon pump support cardiac catheterization.  Assessment & Plan    1.  Inf-post STEMI: unusual synchronous 2 vessel thrombotic occlusion. Occurred after 2 weeks off clopidogrel, but the RCA stent is 49 years old- formerly place in [employer]. Consider embolic event since he had transient atrial fibrillation, but he is young and LA not dilated.  Currently asymptomatic and hemodynamically stable. Per Dr. Irish Lack: "There appeared to be two acute vessel occlusions, the RCA and the circumflex. The cath showed late stent  thrombosis of his RCA stent.  Aspiration thrombectomy was performed followed by balloon angioplasty.  TIMI-3 flow was restored.  I elected not to stent the RCA given that the circumflex was still occluded and he remained hypotensive.  It was not practical to perform intravascular  imaging at that point due to his condition so I was not sure if his stent was undersized and that was the mode of his very late stent thrombosis".  -Continue dual antiplatelet therapy with aspirin and Brilinta -Continue Lipitor 80 mg daily -Continue carvedilol 6.25 mg twice daily and losartan 12.5 mg daily  2. Cardiogenic shock:  Resolved.    3. AFib: transient, likely AMI related.  No recurrence since he came to [facility].  If he has recurrent AFib, would switch to clopidogrel+DOAC.  4. 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  No indication for pacemaker. Resolved.  5. LV dysfunction- Acute on chronic combined systolic/diastolic CHF: EF 89% and intermediate diastolic dysfunction by echocardiogram.  No evidence of CHF clinically and no clear evidence for increased LA filling pressures on echo. EDP 21-25 at time of cath but acutely decompensated then. Renal function improved and normalized.  -Continue carvedilol and losartan.    6. AKI: May have some degree of renal injury from transient hypotension during his syncopal event and/or contrast nephrotoxicity.  Renal function is back to normal.  7. PAD: s/p R transmetatarsal amputation.  8. HLP: supposed to be on statin; LDL 136 (was 67 in February) suggests noncompliance. Will resume lipitor at higher dose of 80 mg daily. Lipid panel and LFTs in 8 weeks.   9. Bilateral hand weakness:  Appreciate Neuro evaluation. Significant cervical spine stenosis and foraminal narrowing. May eventually need to consider surgery  but given recent MI and need for uninterrupted DAPT, did don't felt he can have surgery any time soon. At a minimum would need uninterrupted DAPT for  at least 6 months.  Change gabapentin to Lyrica team. Per today's note "Start lyrica 75m bid. May increase in increments up to 1531mday in 2-3 divided doses at weekly intervals as needed for pain mgmt up to max dose 60036may in 2-3 divided doses. Please contact neurology with any questions". Not felt to be a candidate for inpatient Rehab. Will initiate search for SNF bed.   10. DM: on insulin. Poorly controlled, A1c 11.2 in February. Now 10.4.  Noncompliant with insulin therapy      Patient is stable from a cardiac standpoint. Plan DC to SNF when be available.  For questions or updates, please contact [facility] consult [email or web address] for contact info under        Signed, BLeanor KailA  01/26/2021, 10:45 AM

## 2021-01-26 NOTE — Progress Notes (Signed)
Occupational Therapy Treatment Patient Details Name: [name] MRN: [id number] DOB: [birth year] Today's Date: 01/26/2021    History of present illness 50 yo admitted 6/4 with STEMI with in stent thrombosis with Afib with RVR needing IABP. 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 CT: C5-6: Moderate to large broad-based disc protrusion asymmetric left  with mass effect on the ventral thecal sac and obliteration of the  ventral CSF space. PMhx: HTN, HLD, NASH cirrhosis, DM, neuropathy, cellulitis, Rt transmet that has dressing on it.   OT comments  This 50 yo male seen today in conjunction with PT to see if we could progress his standing, standing tolerance, and moving his feet in place while standing to work towards more functional mobility and ADLs. He was able to stand with [place] 4x requiring min A+2-Max A +2, able to lift RLE completely off of floor and place back down, but only able to raise LLE at heel. He had trouble standing totally upright due to what he reported as back spasms in his lower back. He stood each time from ~1-1.5 minutes. He will continue to benefit from acute OT with now follow changed to SNF.  Follow Up Recommendations  SNF;Supervision/Assistance - 24 hour (doesnt have adequate support post D/C for CIR)    Equipment Recommendations  Other (comment) (TBD next venue)       Precautions / Restrictions Precautions Precautions: Fall Precaution Comments: bil UE and LE weakness Restrictions Weight Bearing Restrictions: No       Mobility Bed Mobility               General bed mobility comments: Pt up in recliner upon arrival    Transfers Overall transfer level: Needs assistance Equipment used: 2 person hand held assist Transfers: Sit to/from Stand Sit to Stand: Min assist;Mod assist;Max assist         General transfer comment: From recliner with pt pulling up on bed rail he was Min A x2 stands, Mod A  x1 stand, and Max A the last stand with VCs for upright posture (reported he would have spasms in low back when he tried to stand up straight), stood each of the 4 times 1-1.5 minutes.    Balance Overall balance assessment: Needs assistance Sitting-balance support: Bilateral upper extremity supported;Feet supported Sitting balance-Leahy Scale: Poor Sitting balance - Comments: requires hands on bed rail in front of him to maintain sitting forward off of back of recliner Postural control: Posterior lean Standing balance support: Bilateral upper extremity supported Standing balance-Leahy Scale: Poor Standing balance comment: min-Mod A +2 to maintain standing with hands on bedrail with right lateral lean                           ADL either performed or assessed with clinical judgement     Vision Patient Visual Report: No change from baseline            Cognition Arousal/Alertness: Awake/alert Behavior During Therapy: WFL for tasks assessed/performed Overall Cognitive Status: Within Functional Limits for tasks assessed                                                     Pertinent Vitals/ Pain  Pain Assessment: Faces Faces Pain Scale: Hurts even more Pain Location: low back (spasm in standing) Pain Descriptors / Indicators: Grimacing;Guarding;Spasm Pain Intervention(s): Limited activity within patient's tolerance;Monitored during session;Repositioned         Frequency  Min 2X/week        Progress Toward Goals  OT Goals(current goals can now be found in the care plan section)  Progress towards OT goals: Progressing toward goals  Acute Rehab OT Goals Patient Stated Goal: to get back to normal OT Goal Formulation: With patient Time For Goal Achievement: 02/05/21 Potential to Achieve Goals: Good  Plan Discharge plan needs to be updated    Co-evaluation    PT/OT/SLP Co-Evaluation/Treatment: Yes Reason for Co-Treatment: For  patient/therapist safety;To address functional/ADL transfers PT goals addressed during session: Mobility/safety with mobility;Balance;Strengthening/ROM OT goals addressed during session: Strengthening/ROM      AM-PAC OT "6 Clicks" Daily Activity     Outcome Measure   Help from another person eating meals?: A Little Help from another person taking care of personal grooming?: A Little Help from another person toileting, which includes using toliet, bedpan, or urinal?: Total Help from another person bathing (including washing, rinsing, drying)?: A Lot Help from another person to put on and taking off regular upper body clothing?: A Lot Help from another person to put on and taking off regular lower body clothing?: Total 6 Click Score: 12    End of Session Equipment Utilized During Treatment: Gait belt  OT Visit Diagnosis: Other abnormalities of gait and mobility (R26.89);Muscle weakness (generalized) (M62.81);Pain;Other (comment) (quadraparesis) Pain - part of body:  (low back)   Activity Tolerance Patient tolerated treatment well   Patient Left in chair;with call bell/phone within reach;with chair alarm set   Nurse Communication Mobility status (written on board +2 with stedy for nursing)        Time: 9518-8416 OT Time Calculation (min): 53 min  Charges: OT General Charges $OT Visit: 1 Visit OT Treatments $Therapeutic Activity: 23-37 mins  [name], OTR/L Acute [employer] Pager [phone] Office [phone]     Almon Register 01/26/2021, 5:44 PM

## 2021-01-27 LAB — GLUCOSE, CAPILLARY
Glucose-Capillary: 96 mg/dL (ref 70–99)
Glucose-Capillary: 147 mg/dL — ABNORMAL HIGH (ref 70–99)
Glucose-Capillary: 193 mg/dL — ABNORMAL HIGH (ref 70–99)
Glucose-Capillary: 160 mg/dL — ABNORMAL HIGH (ref 70–99)

## 2021-01-27 MED ORDER — DIPHENHYDRAMINE HCL 25 MG PO CAPS
25.0000 mg | ORAL_CAPSULE | Freq: Every evening | ORAL | Status: DC | PRN
  Administered 2021-01-27 – 2021-01-30 (×3): 25 mg via ORAL
  Filled 2021-01-27 (×3): qty 1

## 2021-01-27 NOTE — NC FL2 (Signed)
[place] LEVEL OF CARE SCREENING TOOL     IDENTIFICATION  Patient Name: [name] Birthdate: 12-22-1970 Sex: male Admission Date (Current Location): 01/19/2021  [facility] and [place] Number:  Herbalist and Address:  The [place], [place] [address], [place], [place] [address]      Provider Number: [id number]  Attending Physician Name and Address:  Jettie Booze, MD  Relative Name and Phone Number:  Patric Dykes, [phone]    Current Level of Care: Hospital Recommended Level of Care: [place] Prior Approval Number:    Date Approved/Denied:   PASRR Number: [id number]  Discharge Plan: SNF    Current Diagnoses: Patient Active Problem List   Diagnosis Date Noted   Weakness of both arms    Cervical stenosis of spine    Cervical myelopathy (HCC)    STEMI involving right coronary artery ([place]) 01/23/2021   Cardiogenic shock ([place]) 01/20/2021   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[place]) 10/13/2020   Sepsis ([place]) 10/08/2020   Cellulitis in diabetic foot ([place]) 09/27/2020   Transaminitis 09/27/2020   Coronary artery disease involving native coronary artery of native heart without angina pectoris 12/06/2015   Uncontrolled type 2 diabetes mellitus with circulatory disorder, without long-term current use of insulin ([place]) 12/06/2015   Preventative health care 12/06/2015   Essential hypertension 12/06/2015   Hyperlipidemia 12/06/2015   Tobacco abuse 12/06/2015    Orientation RESPIRATION BLADDER Height & Weight     Self, Time, Situation, Place  Normal Incontinent, External catheter Weight: 225 lb 8.5 oz (102.3 kg) Height:     BEHAVIORAL SYMPTOMS/MOOD NEUROLOGICAL BOWEL NUTRITION STATUS      Continent Diet (See DC summary)  AMBULATORY STATUS COMMUNICATION OF NEEDS Skin   Extensive Assist Verbally Surgical wounds (R foot amputation)                       Personal Care Assistance Level of  Assistance  Bathing, Feeding, Dressing Bathing Assistance: Maximum assistance Feeding assistance: Limited assistance Dressing Assistance: Maximum assistance     Functional Limitations Info  Sight, Hearing, Speech Sight Info: Adequate Hearing Info: Adequate Speech Info: Adequate    SPECIAL CARE FACTORS FREQUENCY  PT (By licensed PT), OT (By licensed OT)     PT Frequency: 5x week OT Frequency: 5x week            Contractures Contractures Info: Not present    Additional Factors Info  Code Status, Insulin Sliding Scale Code Status Info: Full     Insulin Sliding Scale Info: Insulin Aspart (Novolog) 0-15 U 3x daily w/ meals, 6U daily w/ meals; Insulin Glargine (Lantus) 30 U daily       Current Medications (01/27/2021):  This is the current hospital active medication list Current Facility-Administered Medications  Medication Dose Route Frequency Provider Last Rate Last Admin   acetaminophen (TYLENOL) tablet 650 mg  650 mg Oral Q4H PRN [place], Peter M, MD   650 mg at 01/26/21 2030   aspirin EC tablet 81 mg  81 mg Oral Daily [place]ique, Peter M, MD   81 mg at 01/27/21 0914   atorvastatin (LIPITOR) tablet 80 mg  80 mg Oral Daily Martinique, Peter M, MD   80 mg at 01/27/21 0914   carvedilol (COREG) tablet 6.25 mg  6.25 mg Oral BID WC Martinique, Peter M, MD   6.25 mg at 01/27/21 0810   Chlorhexidine Gluconate Cloth 2 %  PADS 6 each  6 each Topical Daily [place]ique, Peter M, MD   6 each at 01/25/21 0826   enoxaparin (LOVENOX) injection 40 mg  40 mg Subcutaneous Q24H [place]ique, Peter M, MD   40 mg at 01/27/21 0916   HYDROcodone-acetaminophen (NORCO/VICODIN) 5-325 MG per tablet 1 tablet  1 tablet Oral Q6H PRN Jaci Lazier, MD   1 tablet at 01/27/21 0810   insulin aspart (novoLOG) injection 0-15 Units  0-15 Units Subcutaneous TID [facility] Martinique, Peter M, MD   2 Units at 01/26/21 1909   insulin aspart (novoLOG) injection 6 Units  6 Units Subcutaneous TID WC Martinique, Peter M, MD   6 Units at 01/26/21 1909    insulin glargine (LANTUS) injection 30 Units  30 Units Subcutaneous Daily Martinique, Peter M, MD   30 Units at 01/27/21 0915   losartan (COZAAR) tablet 12.5 mg  12.5 mg Oral Daily Martinique, Peter M, MD   12.5 mg at 01/27/21 0914   methocarbamol (ROBAXIN) tablet 750 mg  750 mg Oral Q6H PRN [place], Peter M, MD   750 mg at 01/27/21 0810   nitroGLYCERIN (NITROSTAT) SL tablet 0.4 mg  0.4 mg Sublingual Q5 Min x 3 PRN [place], Peter M, MD       ondansetron [facility]) injection 4 mg  4 mg Intravenous Q6H PRN [place], Peter M, MD       pregabalin (LYRICA) capsule 75 mg  75 mg Oral BID Derek Jack, MD   75 mg at 01/27/21 0915   ticagrelor (BRILINTA) tablet 90 mg  90 mg Oral BID [place], Peter M, MD   90 mg at 01/27/21 3968     Discharge Medications: Please see discharge summary for a list of discharge medications.  Relevant Imaging Results:  Relevant Lab Results:   Additional Information SS# [place], LCSWA

## 2021-01-27 NOTE — Progress Notes (Signed)
Progress Note   Subjective   Doing well today, the patient denies CP or SOB.  No new concerns.  Awaiting SNF  Inpatient Medications    Scheduled Meds:  aspirin EC  81 mg Oral Daily   atorvastatin  80 mg Oral Daily   carvedilol  6.25 mg Oral BID WC   Chlorhexidine Gluconate Cloth  6 each Topical Daily   enoxaparin (LOVENOX) injection  40 mg Subcutaneous Q24H   insulin aspart  0-15 Units Subcutaneous TID WC   insulin aspart  6 Units Subcutaneous TID WC   insulin glargine  30 Units Subcutaneous Daily   losartan  12.5 mg Oral Daily   pregabalin  75 mg Oral BID   ticagrelor  90 mg Oral BID   Continuous Infusions:  PRN Meds: acetaminophen, HYDROcodone-acetaminophen, methocarbamol, nitroGLYCERIN, ondansetron (ZOFRAN) IV   Vital Signs    Vitals:   01/27/21 0058 01/27/21 0435 01/27/21 0500 01/27/21 0730  BP: 106/84 97/78  113/83  Pulse: 73 71  71  Resp: 18 18  16   Temp: 97.9 F (36.6 C) 98.1 F (36.7 C)  (!) 97.4 F (36.3 C)  TempSrc: Oral Oral  Oral  SpO2: 98% 96%  100%  Weight:   102.3 kg     Intake/Output Summary (Last 24 hours) at 01/27/2021 0958 Last data filed at 01/27/2021 0436 Gross per 24 hour  Intake 1070 ml  Output 1850 ml  Net -780 ml   Filed Weights   01/24/21 0444 01/27/21 0500  Weight: 98.6 kg 102.3 kg    Telemetry    sinus - Personally Reviewed  Physical Exam   GEN- The patient is ill appearing, alert and oriented x 3 today.   Head- normocephalic, atraumatic Eyes-  Sclera clear, conjunctiva pink Ears- hearing intact Oropharynx- clear Neck- supple, Lungs-  normal work of breathing Heart- Regular rate and rhythm  GI- soft  Extremities- no clubbing, cyanosis, or edema  MS- diffuse atrophy Skin- + ecchymosis Psych- euthymic mood, full affect Neuro- strength and sensation are intact   Labs    Chemistry Recent Labs  Lab 01/23/21 0128 01/24/21 0115 01/25/21 0403  NA 132* 134* 137  K 3.6 3.8 3.7  CL 98 102 104  CO2 26 24 25    GLUCOSE 140* 196* 94  BUN 18 16 13   CREATININE 1.14 1.06 0.90  CALCIUM 8.3* 8.6* 8.6*  GFRNONAA >60 >60 >60  ANIONGAP 8 8 8      Hematology Recent Labs  Lab 01/23/21 0128 01/24/21 0115 01/25/21 0403  WBC 9.0 8.0 8.7  RBC 4.13* 4.29 4.26  HGB 13.0 13.7 13.4  HCT 38.2* 39.7 39.2  MCV 92.5 92.5 92.0  MCH 31.5 31.9 31.5  MCHC 34.0 34.5 34.2  RDW 12.7 12.8 12.8  PLT 170 175 195     Patient ID  50 y.o. male with known CAD s/p remote stents to RCA, presenting with syncope due to high-grade AV block in the setting of acute inferoposterior STEMI due to simultaneous thrombotic occlusion of old RCA stent and native left circumflex coronary artery, treated with emergency PCI and transient intra-aortic balloon pump support cardiac catheterization.  Assessment & Plan    1.  Inferior STEMI Clinically stable Continue ASA and brilinta Continue lipitor, coreg, and losartan  2. Afib Transient in the setting of AMI No further episodes Consider Oval if AF returns  3. Acute on chronic combined systolic/ diastolic CHF Clinically stable Continue medicine optimization while here  4. AKI Resolved  5. Cervical spinal  stenosis Neurology has assessed  6. DM Stable No change required today  Awaiting SNF  Thompson Grayer MD, [facility] 01/27/2021 9:58 AM

## 2021-01-27 NOTE — Plan of Care (Signed)

## 2021-01-28 LAB — GLUCOSE, CAPILLARY
Glucose-Capillary: 107 mg/dL — ABNORMAL HIGH (ref 70–99)
Glucose-Capillary: 186 mg/dL — ABNORMAL HIGH (ref 70–99)
Glucose-Capillary: 167 mg/dL — ABNORMAL HIGH (ref 70–99)
Glucose-Capillary: 128 mg/dL — ABNORMAL HIGH (ref 70–99)

## 2021-01-28 MED ORDER — SENNOSIDES-DOCUSATE SODIUM 8.6-50 MG PO TABS
2.0000 | ORAL_TABLET | Freq: Every evening | ORAL | Status: DC | PRN
  Administered 2021-01-28: 2 via ORAL
  Filled 2021-01-28: qty 2

## 2021-01-28 MED ORDER — MELATONIN 3 MG PO TABS
3.0000 mg | ORAL_TABLET | Freq: Every day | ORAL | Status: DC
  Administered 2021-01-28 – 2021-02-05 (×8): 3 mg via ORAL
  Filled 2021-01-28 (×8): qty 1

## 2021-01-28 MED ORDER — POLYETHYLENE GLYCOL 3350 17 G PO PACK
17.0000 g | PACK | Freq: Every day | ORAL | Status: DC | PRN
  Administered 2021-01-28: 17 g via ORAL
  Filled 2021-01-28: qty 1

## 2021-01-28 NOTE — Plan of Care (Signed)

## 2021-01-28 NOTE — Progress Notes (Signed)
Progress Note   Subjective   Doing well today, the patient denies CP or SOB.  No new concerns  Inpatient Medications    Scheduled Meds:  aspirin EC  81 mg Oral Daily   atorvastatin  80 mg Oral Daily   carvedilol  6.25 mg Oral BID WC   Chlorhexidine Gluconate Cloth  6 each Topical Daily   enoxaparin (LOVENOX) injection  40 mg Subcutaneous Q24H   insulin aspart  0-15 Units Subcutaneous TID WC   insulin aspart  6 Units Subcutaneous TID WC   insulin glargine  30 Units Subcutaneous Daily   losartan  12.5 mg Oral Daily   pregabalin  75 mg Oral BID   ticagrelor  90 mg Oral BID   Continuous Infusions:  PRN Meds: acetaminophen, diphenhydrAMINE, HYDROcodone-acetaminophen, methocarbamol, nitroGLYCERIN, ondansetron (ZOFRAN) IV   Vital Signs    Vitals:   01/28/21 0016 01/28/21 0354 01/28/21 0409 01/28/21 0736  BP: 96/62 108/65  101/65  Pulse: 68 70  68  Resp: 16 18  20   Temp: 98 F (36.7 C) 97.9 F (36.6 C)  97.9 F (36.6 C)  TempSrc: Oral Oral  Oral  SpO2: 97% 100%  97%  Weight:   102.4 kg     Intake/Output Summary (Last 24 hours) at 01/28/2021 8937 Last data filed at 01/28/2021 0740 Gross per 24 hour  Intake --  Output 1960 ml  Net -1960 ml   Filed Weights   01/24/21 0444 01/27/21 0500 01/28/21 0409  Weight: 98.6 kg 102.3 kg 102.4 kg    Telemetry    sinus - Personally Reviewed  Physical Exam   GEN- The patient is chronically ill appearing, alert and oriented x 3 today.   Head- normocephalic, atraumatic Eyes-  Sclera clear, conjunctiva pink Ears- hearing intact Oropharynx- clear Neck- supple, Lungs-  normal work of breathing Heart- Regular rate and rhythm  GI- soft  Extremities- no clubbing, cyanosis, or edema  MS- no significant deformity or atrophy Skin- no rash or lesion Psych- euthymic mood, full affect Neuro- strength and sensation are intact   Labs    Chemistry Recent Labs  Lab 01/23/21 0128 01/24/21 0115 01/25/21 0403  NA 132* 134* 137   K 3.6 3.8 3.7  CL 98 102 104  CO2 26 24 25   GLUCOSE 140* 196* 94  BUN 18 16 13   CREATININE 1.14 1.06 0.90  CALCIUM 8.3* 8.6* 8.6*  GFRNONAA >60 >60 >60  ANIONGAP 8 8 8      Hematology Recent Labs  Lab 01/23/21 0128 01/24/21 0115 01/25/21 0403  WBC 9.0 8.0 8.7  RBC 4.13* 4.29 4.26  HGB 13.0 13.7 13.4  HCT 38.2* 39.7 39.2  MCV 92.5 92.5 92.0  MCH 31.5 31.9 31.5  MCHC 34.0 34.5 34.2  RDW 12.7 12.8 12.8  PLT 170 175 195     Patient ID  50 y.o. male with known CAD s/p remote stents to RCA, presenting with syncope due to high-grade AV block in the setting of acute inferoposterior STEMI due to simultaneous thrombotic occlusion of old RCA stent and native left circumflex coronary artery, treated with emergency PCI and transient intra-aortic balloon pump support cardiac catheterization.  Assessment & Plan    1.  Inferior STEMI Stable No changes today  2. Afib Transient, without recurrence Consider [place] if clinical AF returns  3. 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 DM Stable No change required today  Awaiting SNF  Thompson Grayer MD,  [facility] 01/28/2021 9:07 AM

## 2021-01-29 DIAGNOSIS — R29898 Other symptoms and signs involving the musculoskeletal system: Secondary | ICD-10-CM

## 2021-01-29 DIAGNOSIS — G959 Disease of spinal cord, unspecified: Secondary | ICD-10-CM

## 2021-01-29 DIAGNOSIS — E782 Mixed hyperlipidemia: Secondary | ICD-10-CM

## 2021-01-29 LAB — URINALYSIS, COMPLETE (UACMP) WITH MICROSCOPIC
Bilirubin Urine: NEGATIVE
Glucose, UA: NEGATIVE mg/dL
Ketones, ur: NEGATIVE mg/dL
Leukocytes,Ua: NEGATIVE
Nitrite: NEGATIVE
Protein, ur: 100 mg/dL — AB
RBC / HPF: >50 RBC/hpf — ABNORMAL HIGH (ref 0–5)
Specific Gravity, Urine: 1.013 (ref 1.005–1.030)
pH: 6 (ref 5.0–8.0)

## 2021-01-29 LAB — GLUCOSE, CAPILLARY
Glucose-Capillary: 107 mg/dL — ABNORMAL HIGH (ref 70–99)
Glucose-Capillary: 144 mg/dL — ABNORMAL HIGH (ref 70–99)
Glucose-Capillary: 137 mg/dL — ABNORMAL HIGH (ref 70–99)
Glucose-Capillary: 161 mg/dL — ABNORMAL HIGH (ref 70–99)

## 2021-01-29 NOTE — Progress Notes (Signed)
Physical Therapy Treatment Patient Details Name: [name] MRN: [id number] DOB: 01-23-1971 Today's Date: 01/29/2021    History of Present Illness 50 yo admitted 6/4 with STEMI with in stent thrombosis with Afib with RVR needing IABP. 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 CT: C5-6: Moderate to large broad-based disc protrusion asymmetric left  with mass effect on the ventral thecal sac and obliteration of the  ventral CSF space. PMhx: HTN, HLD, NASH cirrhosis, DM, neuropathy, cellulitis, Rt transmet that has dressing on it.    PT Comments    Pt received in chair, used stedy to go back to bed to work on sitting balance and bed mobility. Pt tolerated R and L lateral lean with return to upright as well as posterior lean with return, 5x each. With working on sit>supine pt had severe back spasms alleviated only by raising HOB. Pt mod A +2 to stand from recliner, min +2 from flaps of stedy and elevated bed. Pt returned to recliner end of session. PT will continue to follow.   Follow Up Recommendations  SNF     Equipment Recommendations  Wheelchair cushion (measurements PT);Wheelchair (measurements PT);Hospital bed    Recommendations for Other Services Rehab consult     Precautions / Restrictions Precautions Precautions: Fall Precaution Comments: bil UE and LE weakness Restrictions Weight Bearing Restrictions: No    Mobility  Bed Mobility Overal bed mobility: Needs Assistance Bed Mobility: Sit to Sidelying         Sit to sidelying: Mod assist General bed mobility comments: Pt was able to come down on his right side with trunk, but unable to bring legs up and when A'd to bring legs up he had severe spasms in his legs and back. Returned to sitting from [facility] elevated (which relieved spasms) with mod A +2    Transfers Overall transfer level: Needs assistance Equipment used: 2 person hand held assist Transfers: Sit to/from  Stand Sit to Stand: Min assist;Mod assist;+2 physical assistance Stand pivot transfers: Max assist;+2 physical assistance;+2 safety/equipment;From elevated surface       General transfer comment: From recliner Mod A+2, from bed (slightly raised) Min A +2 with use of Stedy. Needs A to get RUE on bar of stedy. Educated on tightening abdominals prior to sit<>stand to try see if helped with back pain (pt reported that it did). Worked with pt on sitting balance at EOB. Had pt work on leaning back a short distance then leaning back forward to midline or more (x5 reps) pt able to do this on his own with increased effort. Had him work on coming down on right and left forearms alternately then coming up back up to midline (3 times on each side)--pt able to do this with min A due to tendency for posterior lean as he was coming back up to midline.  Ambulation/Gait             General Gait Details: began to work on pregait activities in stedy including wt shifting R and L with facilitation at hips   Stairs             Wheelchair Mobility    Modified Rankin (Stroke Patients Only)       Balance Overall balance assessment: Needs assistance Sitting-balance support: No upper extremity supported;Feet supported Sitting balance-Leahy Scale: Fair Sitting balance - Comments: requires hands on bed rail in front of him to maintain sitting forward off of back  of recliner Postural control: Posterior lean Standing balance support: Bilateral upper extremity supported Standing balance-Leahy Scale: Poor Standing balance comment: min-Mod A +2 to maintain standing with hands on bedrail with right lateral lean                            Cognition Arousal/Alertness: Awake/alert Behavior During Therapy: WFL for tasks assessed/performed Overall Cognitive Status: Within Functional Limits for tasks assessed                                        Exercises General Exercises -  Lower Extremity Heel Slides: AAROM;Both;Supine;5 reps Other Exercises Other Exercises: chair crunches with use of B UEs to assist with trunk flexion progressing wo no use of UE's, 10x Other Exercises: scapular retraction in sitting x10 Other Exercises: shoulder shrugs in sitting x10    General Comments General comments (skin integrity, edema, etc.): pt remains very motivated to progress. Discussed with OT the possibility of baclofen to help manage back spasms and she spoke with RN about this.      Pertinent Vitals/Pain Pain Assessment: Faces Faces Pain Scale: Hurts worst Pain Location: low back (spasm in laying down and bending up knees or raising legs even slightly) Pain Descriptors / Indicators: Grimacing;Guarding;Spasm;Crying Pain Intervention(s): Limited activity within patient's tolerance;Monitored during session;Repositioned;Patient requesting pain meds-RN notified    Home Living Family/patient expects to be discharged to:: Skilled nursing facility                    Prior Function            PT Goals (current goals can now be found in the care plan section) Acute Rehab PT Goals Patient Stated Goal: to get back to normal PT Goal Formulation: With patient Time For Goal Achievement: 02/05/21 Potential to Achieve Goals: Fair Progress towards PT goals: Progressing toward goals    Frequency    Min 3X/week      PT Plan Current plan remains appropriate    Co-evaluation PT/OT/SLP Co-Evaluation/Treatment: Yes Reason for Co-Treatment: Complexity of the patient's impairments (multi-system involvement);Necessary to address cognition/behavior during functional activity;For patient/therapist safety PT goals addressed during session: Mobility/safety with mobility;Balance;Proper use of DME;Strengthening/ROM OT goals addressed during session: Strengthening/ROM;ADL's and self-care      AM-PAC PT "6 Clicks" Mobility   Outcome Measure  Help needed turning from your  back to your side while in a flat bed without using bedrails?: A Lot Help needed moving from lying on your back to sitting on the side of a flat bed without using bedrails?: Total Help needed moving to and from a bed to a chair (including a wheelchair)?: Total Help needed standing up from a chair using your arms (e.g., wheelchair or bedside chair)?: Total Help needed to walk in hospital room?: Total Help needed climbing 3-5 steps with a railing? : Total 6 Click Score: 7    End of Session Equipment Utilized During Treatment: Gait belt Activity Tolerance: Patient tolerated treatment well Patient left: in chair;with call bell/phone within reach;with chair alarm set Nurse Communication: Mobility status;Need for lift equipment PT Visit Diagnosis: Other abnormalities of gait and mobility (R26.89);Muscle weakness (generalized) (M62.81);Other symptoms and signs involving the nervous system (Y69.485)     Time: 4627-0350 PT Time Calculation (min) (ACUTE ONLY): 43 min  Charges:  $Therapeutic Activity: 8-22 mins  Leighton Roach, [place]  Pager [phone] Office [place] 01/29/2021, 4:06 PM

## 2021-01-29 NOTE — Progress Notes (Signed)
Occupational Therapy Treatment Patient Details Name: DENVIL CANNING MRN: [id number] DOB: 1970/09/09 Today's Date: 01/29/2021    History of present illness 50 yo admitted 6/4 with STEMI with in stent thrombosis with Afib with RVR needing IABP. 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 CT: C5-6: Moderate to large broad-based disc protrusion asymmetric left  with mass effect on the ventral thecal sac and obliteration of the  ventral CSF space. PMhx: HTN, HLD, NASH cirrhosis, DM, neuropathy, cellulitis, Rt transmet that has dressing on it.   OT comments  This 50 yo male admitted with above seen today with PT to work on sit<>stands, sitting balance at EOB, and bed mobility. Pt did well with sit<>stand and sitting balance tasks, but limited with working on bed mobility due to muscle spasms. 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 He is making progress with sitting balance and maintaining for sit<>stand (min -Mod A +2). He continues to have tone intermittently in RUE and LEs affecting movements he tries to do. He will continue to benefit from acute OT with follow up at SNF.  Follow Up Recommendations  SNF;Supervision/Assistance - 24 hour    Equipment Recommendations  Other (comment) (TBD next venue)       Precautions / Restrictions Precautions Precautions: Fall Precaution Comments: bil UE and LE weakness Restrictions Weight Bearing Restrictions: No       Mobility Bed Mobility Overal bed mobility: Needs Assistance Bed Mobility: Sit to Sidelying         Sit to sidelying: Mod assist General bed mobility comments: Pt was able to come down on his right side with trunk, but unable to bring legs up and when A'd to bring legs up he had severe spasms in his legs and back.    Transfers Overall transfer level: Needs assistance Equipment used: 2 person hand held assist Transfers: Sit to/from Stand Sit to Stand: Min  assist;Mod assist;+2 physical assistance         General transfer comment: From recliner Mod A+2, from bed (slightly raised) Min A +2 with use of Stedy. Needs A to get RUE on bar of stedy. Educated on tightening abdominals prior to sit<>stand to try see if helped with back pain (pt reported that it did). Worked with pt on sitting balance at EOB. Had pt work on leaning back a short distance then leaning back forward to midline or more (x5 reps) pt able to do this on his own with increased effort. Had him work on coming down on right and left forearms alternately then coming up back up to midline (3 times on each side)--pt able to do this with min A due to tendency for posterior lean as he was coming back up to midline.    Balance Overall balance assessment: Needs assistance Sitting-balance support: No upper extremity supported;Feet supported Sitting balance-Leahy Scale: Fair     Standing balance support: Bilateral upper extremity supported Standing balance-Leahy Scale: Poor                             ADL either performed or assessed with clinical judgement   ADL Overall ADL's : Needs assistance/impaired                       Lower Body Dressing Details (indicate cue type and reason): A'd him to cross RLE over  LLE (at knee); once there he was able to doff and donn sock   Toilet Transfer Details (indicate cue type and reason): min A+2-Mod A+2 sit<>stand from recliner and bed with use of sara stedy                 Vision Patient Visual Report: No change from baseline            Cognition Arousal/Alertness: Awake/alert Behavior During Therapy: WFL for tasks assessed/performed Overall Cognitive Status: Within Functional Limits for tasks assessed                                                     Pertinent Vitals/ Pain       Pain Assessment: 0-10 Faces Pain Scale: Hurts worst Pain Location: low back (spasm in laying down and  bending up knees or rasing legs even slightly) Pain Descriptors / Indicators: Grimacing;Guarding;Spasm;Crying Pain Intervention(s): Limited activity within patient's tolerance;Monitored during session;Repositioned  Home Living Family/patient expects to be discharged to:: Skilled nursing facility                                            Frequency  Min 2X/week        Progress Toward Goals  OT Goals(current goals can now be found in the care plan section)  Progress towards OT goals: Progressing toward goals  Acute Rehab OT Goals Patient Stated Goal: to get back to normal OT Goal Formulation: With patient Time For Goal Achievement: 02/05/21 Potential to Achieve Goals: Good ADL Goals Pt Will Perform Upper Body Bathing: (P) with min assist;with adaptive equipment;sitting (EOB, wash mitt) Pt Will Perform Upper Body Dressing: (P) with min assist;sitting (EOB)  Plan Discharge plan remains appropriate    Co-evaluation    PT/OT/SLP Co-Evaluation/Treatment: Yes Reason for Co-Treatment: For patient/therapist safety;To address functional/ADL transfers PT goals addressed during session: Mobility/safety with mobility;Balance;Strengthening/ROM OT goals addressed during session: Strengthening/ROM;ADL's and self-care      AM-PAC OT "6 Clicks" Daily Activity     Outcome Measure   Help from another person eating meals?: A Little Help from another person taking care of personal grooming?: A Little Help from another person toileting, which includes using toliet, bedpan, or urinal?: Total Help from another person bathing (including washing, rinsing, drying)?: A Lot Help from another person to put on and taking off regular upper body clothing?: A Lot Help from another person to put on and taking off regular lower body clothing?: Total 6 Click Score: 12    End of Session Equipment Utilized During Treatment: Gait belt  OT Visit Diagnosis: Other abnormalities of gait  and mobility (R26.89);Unsteadiness on feet (R26.81);Muscle weakness (generalized) (M62.81);Pain;Other (comment) (quadraparesis) Pain - part of body:  (back spasms)   Activity Tolerance Patient tolerated treatment well (but spasms do limit it him at imes)   Patient Left in chair;with call bell/phone within reach;with chair alarm set   Nurse Communication Mobility status        Time: 9150-5697 OT Time Calculation (min): 44 min  Charges: OT General Charges $OT Visit: 1 Visit OT Treatments $Self Care/Home Management : 23-37 mins  [address], OTR/L Acute [employer] Pager [phone] Office [phone]     Almon Register  01/29/2021, 2:25 PM

## 2021-01-29 NOTE — Progress Notes (Addendum)
Progress Note  Patient Name: [name] Date of Encounter: 01/29/2021  [facility] Cardiologist: Larae Grooms, MD   Subjective   Complains L arm weakness. No cp or sob. Nurse reported dark urine.   Inpatient Medications    Scheduled Meds:  aspirin EC  81 mg Oral Daily   atorvastatin  80 mg Oral Daily   carvedilol  6.25 mg Oral BID WC   Chlorhexidine Gluconate Cloth  6 each Topical Daily   enoxaparin (LOVENOX) injection  40 mg Subcutaneous Q24H   insulin aspart  0-15 Units Subcutaneous TID WC   insulin aspart  6 Units Subcutaneous TID WC   insulin glargine  30 Units Subcutaneous Daily   losartan  12.5 mg Oral Daily   melatonin  3 mg Oral QHS   pregabalin  75 mg Oral BID   ticagrelor  90 mg Oral BID   Continuous Infusions:  PRN Meds: acetaminophen, diphenhydrAMINE, HYDROcodone-acetaminophen, methocarbamol, nitroGLYCERIN, ondansetron (ZOFRAN) IV, polyethylene glycol, senna-docusate   Vital Signs    Vitals:   01/28/21 2031 01/29/21 0037 01/29/21 0457 01/29/21 0743  BP: 96/68 116/84 102/72 107/73  Pulse: 70 69 65 66  Resp: 18 16 20    Temp: 98.1 F (36.7 C) 97.8 F (36.6 C) 97.7 F (36.5 C) 97.8 F (36.6 C)  TempSrc: Oral Oral  Oral  SpO2: 97% 98% 99% 100%  Weight:        Intake/Output Summary (Last 24 hours) at 01/29/2021 1029 Last data filed at 01/29/2021 0900 Gross per 24 hour  Intake 360 ml  Output 2200 ml  Net -1840 ml   Last 3 Weights 01/28/2021 01/27/2021 01/24/2021  Weight (lbs) 225 lb 12 oz 225 lb 8.5 oz 217 lb 6 oz  Weight (kg) 102.4 kg 102.3 kg 98.6 kg      Telemetry    SR - Personally Reviewed  ECG    N/a  Physical Exam   GEN: No acute distress.   Neck: No JVD Cardiac: RRR, no murmurs, rubs, or gallops.  Respiratory: Clear to auscultation bilaterally. GI: Soft, nontender, non-distended  MS: No edema; No deformity. Neuro:  Nonfocal;  right foot transmetatarsal amputation, L arm weakness  Psych: Normal affect   Labs     High Sensitivity Troponin:   Recent Labs  Lab 01/19/21 2014  TROPONINIHS 2,012*      Chemistry Recent Labs  Lab 01/23/21 0128 01/24/21 0115 01/25/21 0403  NA 132* 134* 137  K 3.6 3.8 3.7  CL 98 102 104  CO2 26 24 25   GLUCOSE 140* 196* 94  BUN 18 16 13   CREATININE 1.14 1.06 0.90  CALCIUM 8.3* 8.6* 8.6*  GFRNONAA >60 >60 >60  ANIONGAP 8 8 8      Hematology Recent Labs  Lab 01/23/21 0128 01/24/21 0115 01/25/21 0403  WBC 9.0 8.0 8.7  RBC 4.13* 4.29 4.26  HGB 13.0 13.7 13.4  HCT 38.2* 39.7 39.2  MCV 92.5 92.5 92.0  MCH 31.5 31.9 31.5  MCHC 34.0 34.5 34.2  RDW 12.7 12.8 12.8  PLT 170 175 195    Radiology    No results found.  Cardiac Studies   Echocardiogram 01/20/2021    1. Left ventricular ejection fraction, by estimation, is 40%. The left  ventricle has moderately decreased function. The left ventricle  demonstrates regional wall motion abnormalities (see scoring  diagram/findings for description). Left ventricular  diastolic parameters are indeterminate. There is severe hypokinesis of the  left ventricular, entire inferior wall and inferolateral wall.  2. Right ventricular systolic function is mildly reduced. The right  ventricular size is normal. Tricuspid regurgitation signal is inadequate  for assessing PA pressure.   3. The mitral valve is normal in structure. No evidence of mitral valve  regurgitation.   4. The aortic valve is tricuspid. Aortic valve regurgitation is not  visualized. Mild aortic valve sclerosis is present, with no evidence of  aortic valve stenosis.   5. The inferior vena cava is dilated in size with <50% respiratory  variability, suggesting right atrial pressure of 15 mmHg.     CATH 01/19/2021   Mid RCA lesion is 100% stenosed- very late stent thrombosis. Aspiration thrombectomy was performed which restored TIMI-3 flow. Balloon angioplasty was performed using a BALLOON  [place] [id number]. Post intervention, there is a 0%  residual stenosis. Mid Cx lesion is 100% stenosed. A drug-eluting stent was successfully placed using a STENT RESOLUTE ONYX 2.5X15, postdilated to greater than 3 mm. Post intervention, there is a 0% residual stenosis. LV end diastolic pressure is mildly elevated. There is no aortic valve stenosis. Temporary pacemaker placement was used. Heart rate improved after PCI of the RCA. The temporary pacer was removed. Successful placement of the intra-aortic balloon pump.   Dual antiplatelet therapy for 12 months along with aggressive secondary prevention.  Very late stent thrombosis of the RCA likely due to his antiplatelet therapy being stopped back in February.  Second acute occlusion of the circumflex.     Transfer to [facility] given his intra-aortic balloon pump.   Diagnostic Dominance: Right      Intervention           Patient Profile     50 y.o. male with known CAD s/p remote stents to RCA, presenting with syncope due to high-grade AV block in the setting of acute inferoposterior STEMI due to simultaneous thrombotic occlusion of old RCA stent and native left circumflex coronary artery, treated with emergency PCI and transient intra-aortic balloon pump support cardiac catheterization.   Assessment & Plan    1.  Inf-post STEMI: unusual synchronous 2 vessel thrombotic occlusion. Occurred after 2 weeks off clopidogrel, but the RCA stent is 50 years old- formerly place in [employer]. Consider embolic event since he had transient atrial fibrillation, but he is young and LA not dilated.  Currently asymptomatic and hemodynamically stable. Per Dr. Irish Lack: "There appeared to be two acute vessel occlusions, the RCA and the circumflex. The cath showed late stent thrombosis of his RCA stent.  Aspiration thrombectomy was performed followed by balloon angioplasty.  TIMI-3 flow was restored.  I elected not to stent the RCA given that the circumflex was still occluded and he remained hypotensive.  It  was not practical to perform intravascular imaging at that point due to his condition so I was not sure if his stent was undersized and that was the mode of his very late stent thrombosis".  -Continue dual antiplatelet therapy with aspirin and Brilinta -Continue Lipitor 80 mg daily -Continue carvedilol 6.25 mg twice daily and losartan 12.5 mg daily   2. Cardiogenic shock:  Resolved.     3. AFib: transient, likely AMI related.  No recurrence since he came to [facility]  If he has recurrent AFib, would switch to clopidogrel+DOAC.   4. 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  No indication for pacemaker. Resolved.   5. LV dysfunction- Acute on chronic combined systolic/diastolic CHF: EF 09% and intermediate diastolic dysfunction by echocardiogram.  No evidence of CHF  clinically and no clear evidence for increased LA filling pressures on echo. EDP 21-25 at time of cath but acutely decompensated then. Renal function improved and normalized. Envolmic.  -Continue carvedilol and losartan.     6. AKI:  Renal function is back to normal.   7. PAD: s/p R transmetatarsal amputation.   8. HLP: supposed to be on statin; LDL 136 (was 67 in February) suggests noncompliance. Will resume lipitor at higher dose of 80 mg daily. Lipid panel and LFTs in 8 weeks.    9. Bilateral hand weakness:  Appreciate Neuro evaluation. Significant cervical spine stenosis and foraminal narrowing. May eventually need to consider surgery  but given recent MI and need for uninterrupted DAPT, did don't felt he can have surgery any time soon. At a minimum would need uninterrupted DAPT for at least 6 months.  Change gabapentin to Lyrica team. Per today's note "Start lyrica 63m bid. May increase in increments up to 1553mday in 2-3 divided doses at weekly intervals as needed for pain mgmt up to max dose 60063may in 2-3 divided doses. Please contact neurology with any questions".   10. DM: on insulin. Poorly controlled, A1c  11.2 in February. Now 10.4.  Noncompliant with insulin therapy.  11. Dark urine: get UA  Patient does not have an insurance. Waiting SNF. Per SW, it may take more than month to get SNF. SW will try to get reevaluate for inpatient rehab.   For questions or updates, please contact [facility] consult [email or web address] for contact info under        Signed, BhaLeanor KailA  01/29/2021, 10:29 AM     Agree with note by VinRobbie Lis-C  10 days status post inferior myocardial infarction complicated by conduction abnormality and cardiogenic shock treated with aspiration from ectomy, PCI of an occluded RCA stent and stenting of the occluded circumflex.  Does have moderate LV dysfunction.  He is on appropriate medications and is stable and asymptomatic from a heart point of view.  He had transient acute renal insufficiency which has resolved.  His major issues now are neurologic with inability to walk and weakness in his arms apparently related to spinal stenosis.  Because of his recent coronary intervention he will need uninterrupted DAPT for at least 6 months which precludes neurosurgery.  We are attempting to facilitate placement but this has been complicated by insurance issues.  We have asked the [facility] hospitalist to assist in his noncardiac medical issues and placement which they have graciously agreed to do them become consultants in his care.  JonLorretta Harp.D., FACNorth CaldwellACPromedica Bixby HospitalAHLaverta BaltimoreCHiseville09 Indian Spring StreetuiAlpaughC  [phone]/2022 12:14 PM

## 2021-01-30 DIAGNOSIS — R5381 Other malaise: Secondary | ICD-10-CM

## 2021-01-30 LAB — GLUCOSE, CAPILLARY
Glucose-Capillary: 87 mg/dL (ref 70–99)
Glucose-Capillary: 97 mg/dL (ref 70–99)
Glucose-Capillary: 132 mg/dL — ABNORMAL HIGH (ref 70–99)
Glucose-Capillary: 101 mg/dL — ABNORMAL HIGH (ref 70–99)
Glucose-Capillary: 105 mg/dL — ABNORMAL HIGH (ref 70–99)

## 2021-01-30 NOTE — Progress Notes (Addendum)
Progress Note  Patient Name: [name] Date of Encounter: 01/30/2021  [facility] Cardiologist: Larae Grooms, MD   Subjective   Feeling well. No chest pain, sob or palpitations.    Inpatient Medications    Scheduled Meds:  aspirin EC  81 mg Oral Daily   atorvastatin  80 mg Oral Daily   carvedilol  6.25 mg Oral BID WC   Chlorhexidine Gluconate Cloth  6 each Topical Daily   enoxaparin (LOVENOX) injection  40 mg Subcutaneous Q24H   insulin aspart  0-15 Units Subcutaneous TID WC   insulin aspart  6 Units Subcutaneous TID WC   insulin glargine  30 Units Subcutaneous Daily   losartan  12.5 mg Oral Daily   melatonin  3 mg Oral QHS   pregabalin  75 mg Oral BID   ticagrelor  90 mg Oral BID   Continuous Infusions:  PRN Meds: acetaminophen, diphenhydrAMINE, HYDROcodone-acetaminophen, methocarbamol, nitroGLYCERIN, ondansetron (ZOFRAN) IV, polyethylene glycol, senna-docusate   Vital Signs    Vitals:   01/29/21 2307 01/30/21 0358 01/30/21 0755 01/30/21 1014  BP: 103/67 99/69 110/79 118/78  Pulse: 68 65 68 72  Resp: 18 18 20    Temp: 97.8 F (36.6 C) 98.1 F (36.7 C) 97.8 F (36.6 C)   TempSrc: Oral Oral Oral   SpO2: 100% 98% 98%   Weight:        Intake/Output Summary (Last 24 hours) at 01/30/2021 1110 Last data filed at 01/30/2021 1029 Gross per 24 hour  Intake 480 ml  Output 1201 ml  Net -721 ml   Last 3 Weights 01/28/2021 01/27/2021 01/24/2021  Weight (lbs) 225 lb 12 oz 225 lb 8.5 oz 217 lb 6 oz  Weight (kg) 102.4 kg 102.3 kg 98.6 kg      Telemetry    N/A  ECG    N/A  Physical Exam   GEN: No acute distress.   Neck: No JVD Cardiac: RRR, no murmurs, rubs, or gallops.  Respiratory: Clear to auscultation bilaterally. GI: Soft, nontender, non-distended  MS: No edema; No deformity. Neuro:  Nonfocal.  right foot transmetatarsal amputation, L arm weakness  Psych: Normal affect   Labs    High Sensitivity Troponin:   Recent Labs  Lab  01/19/21 2014  TROPONINIHS 2,012*      Chemistry Recent Labs  Lab 01/24/21 0115 01/25/21 0403  NA 134* 137  K 3.8 3.7  CL 102 104  CO2 24 25  GLUCOSE 196* 94  BUN 16 13  CREATININE 1.06 0.90  CALCIUM 8.6* 8.6*  GFRNONAA >60 >60  ANIONGAP 8 8     Hematology Recent Labs  Lab 01/24/21 0115 01/25/21 0403  WBC 8.0 8.7  RBC 4.29 4.26  HGB 13.7 13.4  HCT 39.7 39.2  MCV 92.5 92.0  MCH 31.9 31.5  MCHC 34.5 34.2  RDW 12.8 12.8  PLT 175 195     Radiology    No results found.  Cardiac Studies   Echocardiogram 01/20/2021    1. Left ventricular ejection fraction, by estimation, is 40%. The left  ventricle has moderately decreased function. The left ventricle  demonstrates regional wall motion abnormalities (see scoring  diagram/findings for description). Left ventricular  diastolic parameters are indeterminate. There is severe hypokinesis of the  left ventricular, entire inferior wall and inferolateral wall.   2. Right ventricular systolic function is mildly reduced. The right  ventricular size is normal. Tricuspid regurgitation signal is inadequate  for assessing PA pressure.   3. The mitral  valve is normal in structure. No evidence of mitral valve  regurgitation.   4. The aortic valve is tricuspid. Aortic valve regurgitation is not  visualized. Mild aortic valve sclerosis is present, with no evidence of  aortic valve stenosis.   5. The inferior vena cava is dilated in size with <50% respiratory  variability, suggesting right atrial pressure of 15 mmHg.     CATH 01/19/2021   Mid RCA lesion is 100% stenosed- very late stent thrombosis. Aspiration thrombectomy was performed which restored TIMI-3 flow. Balloon angioplasty was performed using a BALLOON [place] [id number]. Post intervention, there is a 0% residual stenosis. Mid Cx lesion is 100% stenosed. A drug-eluting stent was successfully placed using a STENT RESOLUTE ONYX 2.5X15, postdilated to greater than 3  mm. Post intervention, there is a 0% residual stenosis. LV end diastolic pressure is mildly elevated. There is no aortic valve stenosis. Temporary pacemaker placement was used. Heart rate improved after PCI of the RCA. The temporary pacer was removed. Successful placement of the intra-aortic balloon pump.   Dual antiplatelet therapy for 12 months along with aggressive secondary prevention.  Very late stent thrombosis of the RCA likely due to his antiplatelet therapy being stopped back in February.  Second acute occlusion of the circumflex.     Transfer to [facility] given his intra-aortic balloon pump.   Diagnostic Dominance: Right      Intervention         Patient Profile     50 y.o. male with known CAD s/p remote stents to RCA, presenting with syncope due to high-grade AV block in the setting of acute inferoposterior STEMI due to simultaneous thrombotic occlusion of old RCA stent and native left circumflex coronary artery, treated with emergency PCI and transient intra-aortic balloon pump support cardiac catheterization.  Assessment & Plan    1.  Inf-post STEMI: unusual synchronous 2 vessel thrombotic occlusion. Occurred after 2 weeks off clopidogrel, but the RCA stent is 49 years old- formerly place in [employer]. Consider embolic event since he had transient atrial fibrillation, but he is young and LA not dilated.  Currently asymptomatic and hemodynamically stable. Per Dr. Irish Lack: "There appeared to be two acute vessel occlusions, the RCA and the circumflex. The cath showed late stent thrombosis of his RCA stent.  Aspiration thrombectomy was performed followed by balloon angioplasty.  TIMI-3 flow was restored.  I elected not to stent the RCA given that the circumflex was still occluded and he remained hypotensive.  It was not practical to perform intravascular imaging at that point due to his condition so I was not sure if his stent was undersized and that was the mode of his very  late stent thrombosis".  -Continue dual antiplatelet therapy with aspirin and Brilinta -Continue Lipitor 80 mg daily -Continue carvedilol 6.25 mg twice daily and losartan 12.5 mg daily   2. Cardiogenic shock:  Resolved.     3. AFib: transient, likely AMI related.  No recurrence since he came to [facility].  If he has recurrent AFib, would switch to clopidogrel+DOAC.   4. 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  No indication for pacemaker. Resolved.   5. LV dysfunction- Acute on chronic combined systolic/diastolic CHF: EF 32% and intermediate diastolic dysfunction by echocardiogram.  No evidence of CHF clinically and no clear evidence for increased LA filling pressures on echo. EDP 21-25 at time of cath but acutely decompensated then. Renal function improved and normalized. Envolmic.  -Continue carvedilol and losartan.  6. AKI:  Renal function is back to normal.   7. PAD: s/p R transmetatarsal amputation.   8. HLP: supposed to be on statin; LDL 136 (was 67 in February) suggests noncompliance. Will resume lipitor at higher dose of 80 mg daily. Lipid panel and LFTs in 8 weeks.    9. Bilateral hand weakness:  Appreciate Neuro evaluation. Significant cervical spine stenosis and foraminal narrowing. May eventually need to consider surgery  but given recent MI and need for uninterrupted DAPT, did don't felt he can have surgery any time soon. At a minimum would need uninterrupted DAPT for at least 6 months.  Change gabapentin to Lyrica team. Appreciate IM input.   10. DM: on insulin. Poorly controlled, A1c 11.2 in February. Now 10.4.  Noncompliant with insulin therapy.  Appreciate IM help.   For questions or updates, please contact [place] Please consult [email or web address] for contact info under        Signed, Leanor Kail, PA  01/30/2021, 11:10 AM     Agree with note by Robbie Lis PA-C  Cardiac stable.  No chest pain or shortness of breath.  On appropriate  medications.  Major issue now is neuro.  Appreciate IM input.  Awaiting placement.  Lorretta Harp, M.D., [place], [facility], Laverta Baltimore [place] [address] [place], [place]  [id number]  [phone] 01/30/2021 11:47 AM

## 2021-01-30 NOTE — Consult Note (Signed)
Consultation Note   Frank KLAHN Gutierrez:[id number] DOB: [birth year] DOA: 01/19/2021  Status: Remains inpatient appropriate because:Unsafe d/c plan and Inpatient level of care appropriate due to severity of illness  Dispo: The patient is from: Home              Anticipated d/c is to: SNF              Patient currently is medically stable to d/c.   Difficult to place patient Yes   PCP: Coral Spikes, DO   Consulting MD: Karmen Bongo, MD  Requesting physician: Irish Lack  Chief Complaint: Admitted with syncope secondary to acute STEMI  HPI: [name] is a 50 y.o. male with medical history significant for known CAD s/p remote stents to RCA, presenting with syncope due to high-grade AV block in the setting of acute inferoposterior STEMI due to simultaneous thrombotic occlusion of old RCA stent and native left circumflex coronary artery, treated with emergency PCI and transient intra-aortic balloon pump support cardiac catheterization.  Vital Signs/ED COURSE:  BP 118/78   Pulse 72   Temp 97.8 F (36.6 C) (Oral)   Resp 20   Wt 102.4 kg   SpO2 98%   BMI 28.98 kg/m   Review of Systems:  In addition to the HPI above,  No Fever-chills, myalgias or other constitutional symptoms No Headache, changes with Vision or hearing, + new weakness of bilateral upper extremities noted this admission and has been evaluated by neurology.  No tingling, numbness in any extremity, dizziness, dysarthria or word finding difficulty, gait disturbance or imbalance, tremors or seizure activity No problems swallowing food or Liquids, indigestion/reflux, choking or coughing while eating, abdominal pain with or after eating No current Chest pain, Cough or Shortness of Breath, palpitations, orthopnea or DOE No Abdominal pain, N/V, melena,hematochezia, dark tarry stools, constipation No dysuria, malodorous urine, hematuria or flank pain No new skin rashes, lesions, masses or bruises, No new joint pains,  aches, swelling or redness No recent unintentional weight gain or loss No polyuria, polydypsia or polyphagia   Past Medical History:  Diagnosis Date   Coronary artery disease    Diabetes mellitus without complication ([place])    Heart disease    Myocardial infarction [facility])     Past Surgical History:  Procedure Laterality Date   AMPUTATION Right 10/01/2020   Procedure: AMPUTATION 5th  RAY AND IRRIGATION AND DEBRIDEMENT;  Surgeon: Samara Deist, DPM;  Location: [facility] ORS;  Service: Podiatry;  Laterality: Right;   AMPUTATION Right 10/11/2020   Procedure: AMPUTATION RAY;  Surgeon: Caroline More, DPM;  Location: [facility] ORS;  Service: Podiatry;  Laterality: Right;   BACK SURGERY     CORONARY THROMBECTOMY N/A 01/19/2021   Procedure: Coronary Thrombectomy;  Surgeon: Jettie Booze, MD;  Location: [place] CV LAB;  Service: Cardiovascular;  Laterality: N/A;   CORONARY/GRAFT ACUTE MI REVASCULARIZATION N/A 01/19/2021   Procedure: Coronary/Graft Acute MI Revascularization;  Surgeon: Jettie Booze, MD;  Location: [place] CV LAB;  Service: Cardiovascular;  Laterality: N/A;   IABP INSERTION N/A 01/19/2021   Procedure: IABP Insertion;  Surgeon: Jettie Booze, MD;  Location: [place] CV LAB;  Service: Cardiovascular;  Laterality: N/A;   INCISION AND DRAINAGE Right 09/29/2020   Procedure: INCISION AND DRAINAGE, RIGHT FOOT;  Surgeon: Samara Deist, DPM;  Location: [facility] ORS;  Service: Podiatry;  Laterality: Right;   LEFT HEART CATH AND CORONARY ANGIOGRAPHY N/A 01/19/2021   Procedure: LEFT HEART CATH AND CORONARY ANGIOGRAPHY;  Surgeon:  Jettie Booze, MD;  Location: [place] CV LAB;  Service: Cardiovascular;  Laterality: N/A;   LOWER EXTREMITY ANGIOGRAPHY Right 10/03/2020   Procedure: Lower Extremity Angiography;  Surgeon: Katha Cabal, MD;  Location: [place] CV LAB;  Service: Cardiovascular;  Laterality: Right;   TENDON LENGTHENING Right 10/11/2020   Procedure:  TENDON LENGTHENING;  Surgeon: Caroline More, DPM;  Location: [facility] ORS;  Service: Podiatry;  Laterality: Right;    Social History   Socioeconomic History   Marital status: Single    Spouse name: Not on file   Number of children: Not on file   Years of education: Not on file   Highest education level: Not on file  Occupational History   Not on file  Tobacco Use   Smoking status: Every Day    Packs/day: 1.00    Pack years: 0.00    Types: Cigarettes   Smokeless tobacco: Never  Substance and Sexual Activity   Alcohol use: Yes    Alcohol/week: 0.0 - 1.0 standard drinks   Drug use: No   Sexual activity: Not on file  Other Topics Concern   Not on file  Social History Narrative   Not on file   Social Determinants of Health   Financial Resource Strain: Not on file  Food Insecurity: Not on file  Transportation Needs: Not on file  Physical Activity: Not on file  Stress: Not on file  Social Connections: Not on file  Intimate Partner Violence: Not on file    Mobility: Was independent and ambulatory prior to admission.  Did utilize a cane secondary to recent right transmetatarsal amputation in February of this year.  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 Authorizing Provider  acetaminophen (TYLENOL) 325 MG tablet Take 2 tablets (650 mg total) by mouth every 4 (four) hours as needed for mild pain, fever or headache. 10/13/20  Yes Nicole Kindred A, DO  albuterol (VENTOLIN HFA) 108 (90 Base) MCG/ACT inhaler Inhale 2 puffs into the lungs every 6 (six) hours as needed for wheezing or  shortness of breath. 10/30/20  Yes Merlyn Lot, MD  amoxicillin-clavulanate (AUGMENTIN) 875-125 MG tablet Take 1 tablet by mouth 2 (two) times daily. 11/07/20  Yes Tsosie Billing, MD  atorvastatin (LIPITOR) 40 MG tablet Take 1 tablet (40 mg total) by mouth daily. 12/06/15  Yes Cook, Jayce G, DO  carvedilol (COREG) 3.125 MG tablet Take 1 tablet (3.125 mg total) by mouth 2 (two) times daily with a meal. 10/13/20  Yes Nicole Kindred A, DO  fluticasone (FLONASE) 50 MCG/ACT nasal spray Place 2 sprays into both nostrils daily. 10/14/20  Yes Nicole Kindred A, DO  gabapentin (NEURONTIN) 300 MG capsule Take 2 capsules (600 mg total) by mouth at bedtime. Patient taking differently: Take 300-600 mg by mouth See admin instructions. Take 385m in the morning and 6015mat bedtime. 10/13/20  Yes GrNicole Kindred, DO  glipiZIDE (GLUCOTROL) 10 MG tablet Take 10 mg by mouth daily.   Yes [provider]  meloxicam (MOBIC) 7.5 MG tablet Take 7.5 mg by mouth 2 (  two) times daily. 09/10/20  Yes [provider]  methocarbamol (ROBAXIN) 750 MG tablet Take 1 tablet (750 mg total) by mouth every 6 (six) hours as needed for muscle spasms. 10/13/20  Yes Nicole Kindred A, DO  sildenafil (VIAGRA) 25 MG tablet Take 1 tablet by mouth once as needed for erectile dysfunction. 09/10/20  Yes [provider]  amoxicillin-clavulanate (AUGMENTIN) 875-125 MG tablet TAKE ONE TABLET BY MOUTH 2 TIMES A DAY Patient not taking: Reported on 01/20/2021 11/07/20 11/07/21  Tsosie Billing, MD  Blood Glucose Monitoring Suppl (RELION PRIME MONITOR) DEVI Use up to 4 times daily to check blood sugars. 12/06/15   Coral Spikes, DO  carvedilol (COREG) 3.125 MG tablet TAKE ONE TABLET BY MOUTH 2 TIMES A DAY WITH MEALS Patient not taking: No sig reported 10/13/20 10/13/21  Nicole Kindred A, DO  fluticasone (FLONASE) 50 MCG/ACT nasal spray PLACE 2 SPRAYS INTO BOTH NOSTRILS EVERY DAY Patient not taking: No sig reported  10/13/20 10/13/21  Nicole Kindred A, DO  gabapentin (NEURONTIN) 300 MG capsule TAKE ONE CAPSULE BY MOUTH EVERY MORNING Patient not taking: No sig reported 10/13/20 10/13/21  Nicole Kindred A, DO  gabapentin (NEURONTIN) 300 MG capsule TAKE 2 CAPSULES BY MOUTH AT BEDTIME Patient not taking: No sig reported 10/13/20 10/13/21  Nicole Kindred A, DO  glucose blood (RELION PRIME TEST) test strip Use as instructed 12/06/15   Coral Spikes, DO  Insulin Glargine (BASAGLAR KWIKPEN) 100 UNIT/ML Inject 30 Units into the skin daily. Patient not taking: No sig reported 10/13/20   Nicole Kindred A, DO  Insulin Glargine [facility]) 100 UNIT/ML INJECT 30 UNITS INTO THE SKIN EVERY DAY Patient not taking: No sig reported 10/13/20 10/13/21  Nicole Kindred A, DO  insulin lispro (HUMALOG) 100 UNIT/ML KwikPen Inject 11 Units into the skin 3 (three) times daily with meals. Patient not taking: No sig reported 10/13/20   Nicole Kindred A, DO  insulin lispro (HUMALOG) 100 UNIT/ML KwikPen INJECT 11 UNITS INTO THE SKIN 3 TIMES A DAY WITH MEALS Patient not taking: No sig reported 10/13/20 10/13/21  Nicole Kindred A, DO  Insulin Pen Needle (PEN NEEDLES) 32G X 6 MM MISC 1 each by Does not apply route 4 (four) times daily -  before meals and at bedtime. 10/13/20   Nicole Kindred A, DO  Insulin Pen Needle 32G X 4 MM MISC USE AS DIRECTED 10/13/20 10/13/21  Nicole Kindred A, DO  lisinopril (PRINIVIL,ZESTRIL) 5 MG tablet TAKE ONE TABLET BY MOUTH ONCE DAILY Patient not taking: No sig reported 01/27/17   Coral Spikes, DO  metFORMIN (GLUCOPHAGE) 500 MG tablet TAKE ONE TABLET BY MOUTH TWICE DAILY WITH A MEAL Patient not taking: No sig reported 01/27/17   Coral Spikes, DO  ReliOn Ultra Thin Lancets MISC Use as directed to take blood sugar. 12/06/15   Coral Spikes, DO     LOS: 11 days   Physical Exam: Vitals:   01/29/21 2307 01/30/21 0358 01/30/21 0755 01/30/21 1014  BP: 103/67 99/69 110/79 118/78  Pulse: 68 65 68 72  Resp: 18 18  20    Temp: 97.8 F (36.6 C) 98.1 F (36.7 C) 97.8 F (36.6 C)   TempSrc: Oral Oral Oral   SpO2: 100% 98% 98%   Weight:          Constitutional: NAD, calm, comfortable Respiratory: clear to auscultation bilaterally, no wheezing, no crackles. Normal respiratory effort. No accessory muscle use.  Air Cardiovascular: Regular rate and maintaining sinus  rhythm, no murmurs / rubs / gallops. No extremity edema. 2+ pedal pulses. Abdomen: no tenderness, no masses palpated. No hepatosplenomegaly. Bowel sounds positive. LBM 6/13 Musculoskeletal: no clubbing / cyanosis. No joint deformity upper and lower extremities. Good ROM, no contractures. Normal muscle tone.  Prior right transmetatarsal amputation site well-healed. Skin: no rashes, lesions, ulcers. No induration Neurologic: CN 2-12 grossly intact. Sensation intact, DTR normal. Strength 4/5 in both upper and lower extremities with weak bilateral handgrip Psychiatric: Normal judgment and insight. Alert and oriented x 3. Normal mood.    Labs on Admission: I have personally reviewed following labs and imaging studies  CBC: Recent Labs  Lab 01/24/21 0115 01/25/21 0403  WBC 8.0 8.7  HGB 13.7 13.4  HCT 39.7 39.2  MCV 92.5 92.0  PLT 175 253   Basic Metabolic Panel: Recent Labs  Lab 01/24/21 0115 01/25/21 0403  NA 134* 137  K 3.8 3.7  CL 102 104  CO2 24 25  GLUCOSE 196* 94  BUN 16 13  CREATININE 1.06 0.90  CALCIUM 8.6* 8.6*    CBG: Recent Labs  Lab 01/29/21 1139 01/29/21 1623 01/29/21 2127 01/30/21 0607 01/30/21 0759  GLUCAP 144* 137* 161* 87 97   Urinanalysis:    Component Value Date/Time   COLORURINE YELLOW 01/29/2021 1736   APPEARANCEUR HAZY (A) 01/29/2021 1736   LABSPEC 1.013 01/29/2021 1736   PHURINE 6.0 01/29/2021 1736   GLUCOSEU NEGATIVE 01/29/2021 1736   [place] (A) 01/29/2021 1736   BILIRUBINUR NEGATIVE 01/29/2021 1736   BILIRUBINUR [place] 04/13/2015 [place] 01/29/2021 1736    PROTEINUR 100 (A) 01/29/2021 1736   UROBILINOGEN 4.0 04/13/2015 1516   UROBILINOGEN 1.0 02/28/2010 2235   NITRITE NEGATIVE 01/29/2021 1736   LEUKOCYTESUR NEGATIVE 01/29/2021 1736     Assessment/Plan Principal Problem:   STEMI involving right coronary artery (HCC) Active Problems:   Coronary artery disease involving native coronary artery of native heart without angina pectoris   Uncontrolled type 2 diabetes mellitus with circulatory disorder, without long-term current use of insulin (HCC)   Hyperlipidemia   Tobacco abuse   Cardiogenic shock (HCC)   Weakness of both arms   Cervical stenosis of spine   Cervical myelopathy (HCC)   Inferior posterior STEMI with associated cardiogenic shock Remains asymptomatic without chest pain Patient reports that since he paid for medications out-of-pocket that he did not consistently take medications including his cardiac medications intended to stretch out his medication so they would last longer Continue DAPT with aspirin and Brilinta Continue Lipitor, carvedilol and losartan  Atrial fibrillation Currently maintaining sinus rhythm Cardiology documents atrial fibrillation was transient the context of acute MI; no indication for anticoagulation  Transient third-degree AV block Due to MI and contributed to patient's syncopal presentation No indication for pacemaker  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  Valves are normal. Continue ARB and beta-blocker Currently in a negative balance of 8 L, not requiring diuretic  Acute kidney injury/acute renal insufficiency Creatinine 1.42 with slightly elevated BUN, creatinine was 0.9 on 6/9 Urine dark and since is not a negative balance we will repeat electrolyte panel in a.m.  Diabetes mellitus 2 with hyperglycemia on insulin HgbA1c 10.4 A1c in  February 11.2 In April 2017 A1c was also elevated at 12 point Patient reported that he did not consistently take his medication and stretched out his dosages and  not taking dosages due to inability to afford medications out-of-pocket Over the past several days CBGs have been much better controlled Currently on Lantus 30 units daily.  Given difficulty affording medications can consider changing to 70/30 insulin Continue NovoLog for meal coverage and SSI Liraglutide would be an excellent medication to use for CAD and and empagliflozin for heart failure but patient would be unlikely to afford these newer class of drug  Bilateral hand weakness/significant cervical stenosis with foraminal narrowing Appreciate assistance of the neurology team. Work-up this admission revealed significant C-spine stenosis with foraminal narrowing will to consider potential surgical intervention due to need for uninterrupted DAPT after recent MRI Neurology recommended beginning Lyrica and uptitrate as needed for pain management with a max dose of 600 mg/day and 2-3 divided doses  Physical deconditioning Does not have 24/7 care available after discharge therefore not a candidate for CIR Plan is to pursue additional rehabilitative therapies at a skilled nursing facility Patient is self-pay with Medicaid application in process.  It will likely be difficult to obtain a SNF bed If unable to obtain a bed offer soon will likely refer to our STAR program for aggressive inpatient PT  Dyslipidemia LDL elevated at 136 with normal HDL cholesterol Continue current statin  DVT prophylaxis: Lovenox Code Status: Full Family Communication: Patient only COVID vaccination status: Unknown    Erin Hearing ANP-BC [facility] Pager [phone]   If 7PM-7AM, please contact night-coverage [email or web address]   01/30/2021, 10:30 AM

## 2021-01-30 NOTE — TOC Progression Note (Addendum)
Transition of Care [facility]) - Progression Note    Patient Details  Name: [name] MRN: [id number] Date of Birth: 1970-10-21  Transition of Care [facility]) CM/SW Contact  Curlene Labrum, RN Phone Number: 01/30/2021, 11:34 AM  Clinical Narrative:    Case management spoke with the patient at the bedside regarding transitions of care and the patient states that his sister, Sharyn Lull was unable to offer 24 hour supervision at home so CIR is not an option for this patient at this point and patient is now agreeable to SNF placement.  The patient recently worked as a Camera operator but has been unable to work or have money to pay for medications or follow up with PCP.  The patient lives alone in a mobile home in [place], [place] and is unable to return home due to bilateral lower extremity weakness, S/P Right transmetatarsal amputation and +2 assist with ambulation.  The patient has no insurance with Medicaid application assistance through [employer] counseling.  The patient will be added to the STAR program for rehabilitation at this time while SNF work up is in progress for placement for rehabilitation.  CM emailed Shanon Rosser, Financial Counseling supervisor to assist with Medicaid and disability application and determination since no financial counseling notes were seen with this admission regarding this matter.  I sent a message to Gayla Medicus, CM with choice for possible placement at one of the [employer] facilities.  The patient is agreeable to SNF placement and is not vaccinated for COVID.  CM and MSW on DTP Team will continue to follow for transitions of care needs.   Expected Discharge Plan: [place] Barriers to Discharge: Inadequate or no insurance, Continued Medical Work up  Expected Discharge Plan and Services Expected Discharge Plan: [place]   Discharge Planning Services: CM Consult Post Acute Care Choice: [place] Living arrangements for the past 2 months: Mobile Home                                       Social Determinants of Health (SDOH) Interventions    Readmission Risk Interventions Readmission Risk Prevention Plan 01/30/2021  Post Dischage Appt Complete  Medication Screening Complete  Transportation Screening Complete  Some recent data might be hidden

## 2021-01-31 DIAGNOSIS — R5381 Other malaise: Secondary | ICD-10-CM

## 2021-01-31 LAB — GLUCOSE, CAPILLARY
Glucose-Capillary: 101 mg/dL — ABNORMAL HIGH (ref 70–99)
Glucose-Capillary: 110 mg/dL — ABNORMAL HIGH (ref 70–99)
Glucose-Capillary: 67 mg/dL — ABNORMAL LOW (ref 70–99)
Glucose-Capillary: 81 mg/dL (ref 70–99)
Glucose-Capillary: 133 mg/dL — ABNORMAL HIGH (ref 70–99)

## 2021-01-31 LAB — BASIC METABOLIC PANEL
Anion gap: 10 (ref 5–15)
BUN: 16 mg/dL (ref 6–20)
CO2: 26 mmol/L (ref 22–32)
Calcium: 8.8 mg/dL — ABNORMAL LOW (ref 8.9–10.3)
Chloride: 100 mmol/L (ref 98–111)
Creatinine, Ser: 1.05 mg/dL (ref 0.61–1.24)
GFR, Estimated: >60 mL/min (ref >60)
Glucose, Bld: 102 mg/dL — ABNORMAL HIGH (ref 70–99)
Potassium: 3.7 mmol/L (ref 3.5–5.1)
Sodium: 136 mmol/L (ref 135–145)

## 2021-01-31 MED ORDER — INSULIN GLARGINE 100 UNIT/ML ~~LOC~~ SOLN
15.0000 [IU] | Freq: Every day | SUBCUTANEOUS | Status: DC
  Administered 2021-01-31: 15 [IU] via SUBCUTANEOUS
  Filled 2021-01-31: qty 0.15

## 2021-01-31 MED ORDER — BACLOFEN 10 MG PO TABS
5.0000 mg | ORAL_TABLET | Freq: Three times a day (TID) | ORAL | Status: DC
  Administered 2021-01-31 – 2021-02-05 (×17): 5 mg via ORAL
  Filled 2021-01-31 (×17): qty 1

## 2021-01-31 MED ORDER — PREGABALIN 100 MG PO CAPS
100.0000 mg | ORAL_CAPSULE | Freq: Three times a day (TID) | ORAL | Status: DC
  Administered 2021-01-31 – 2021-02-05 (×17): 100 mg via ORAL
  Filled 2021-01-31 (×17): qty 1

## 2021-01-31 MED ORDER — METFORMIN HCL 500 MG PO TABS
500.0000 mg | ORAL_TABLET | Freq: Two times a day (BID) | ORAL | Status: DC
  Administered 2021-01-31 – 2021-02-05 (×11): 500 mg via ORAL
  Filled 2021-01-31 (×11): qty 1

## 2021-01-31 MED ORDER — METHOCARBAMOL 500 MG PO TABS: 1000.0000 mg | ORAL_TABLET | Freq: Four times a day (QID) | ORAL | Status: DC

## 2021-01-31 MED ORDER — GLIPIZIDE 5 MG PO TABS
10.0000 mg | ORAL_TABLET | Freq: Every day | ORAL | Status: DC
  Administered 2021-01-31 – 2021-02-05 (×6): 10 mg via ORAL
  Filled 2021-01-31 (×8): qty 2

## 2021-01-31 NOTE — Progress Notes (Signed)
Physical Therapy Treatment Patient Details Name: [name] MRN: [id number] DOB: 1970-11-09 Today's Date: 01/31/2021    History of Present Illness 50 yo admitted 6/4 with STEMI with in stent thrombosis with Afib with RVR needing IABP. 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 CT: C5-6: Moderate to large broad-based disc protrusion asymmetric left  with mass effect on the ventral thecal sac and obliteration of the  ventral CSF space. PMhx: HTN, HLD, NASH cirrhosis, DM, neuropathy, cellulitis, Rt transmet that has dressing on it.    PT Comments    Patient progressing towards physical therapy goals. Patient continues to be limited by B UE and LE weakness. Worked on lateral weight shifting in [place] frame and pre gait activities. Patient able to perform sit to stands with min-modA+2 pending fatigue. Patient is highly motivated to participate with therapies. Continue to recommend SNF for ongoing Physical Therapy.       Follow Up Recommendations  SNF     Equipment Recommendations  Wheelchair cushion (measurements PT);Wheelchair (measurements PT);Hospital bed    Recommendations for Other Services       Precautions / Restrictions Precautions Precautions: Fall Precaution Comments: bil UE and LE weakness Restrictions Weight Bearing Restrictions: No    Mobility  Bed Mobility               General bed mobility comments: Sitting in recliner on arrival    Transfers Overall transfer level: Needs assistance Equipment used: Ambulation equipment used Transfers: Sit to/from Stand Sit to Stand: Min assist;Mod assist;+2 physical assistance         General transfer comment: From recliner modA+2 and minA+2 from [place] flaps. Worked on weight shifting in standing with UE support with progression to small steps in [place]. Verbal and tactile cues for upright posture. Performed mini squats x 5. Sit to stand x 5-6 total during  session.  Ambulation/Gait                 Stairs             Wheelchair Mobility    Modified Rankin (Stroke Patients Only)       Balance Overall balance assessment: Needs assistance Sitting-balance support: No upper extremity supported;Feet supported Sitting balance-Leahy Scale: Fair     Standing balance support: Bilateral upper extremity supported Standing balance-Leahy Scale: Poor Standing balance comment: min-modA to maintain standing in [place] with B UE support                            Cognition Arousal/Alertness: Awake/alert Behavior During Therapy: WFL for tasks assessed/performed Overall Cognitive Status: Within Functional Limits for tasks assessed                                        Exercises      General Comments        Pertinent Vitals/Pain Pain Assessment: Faces Faces Pain Scale: Hurts worst Pain Location: LE spasms Pain Descriptors / Indicators: Grimacing;Guarding;Spasm Pain Intervention(s): Monitored during session;Repositioned    Home Living                      Prior Function            PT Goals (current goals can now be found in the care plan section) Acute  Rehab PT Goals Patient Stated Goal: to get back to normal PT Goal Formulation: With patient Time For Goal Achievement: 02/05/21 Potential to Achieve Goals: Fair Progress towards PT goals: Progressing toward goals    Frequency    Min 3X/week      PT Plan Current plan remains appropriate    Co-evaluation PT/OT/SLP Co-Evaluation/Treatment: Yes Reason for Co-Treatment: For patient/therapist safety;To address functional/ADL transfers PT goals addressed during session: Mobility/safety with mobility;Balance;Strengthening/ROM        AM-PAC PT "6 Clicks" Mobility   Outcome Measure  Help needed turning from your back to your side while in a flat bed without using bedrails?: A Lot Help needed moving from lying on your back to  sitting on the side of a flat bed without using bedrails?: Total Help needed moving to and from a bed to a chair (including a wheelchair)?: Total Help needed standing up from a chair using your arms (e.g., wheelchair or bedside chair)?: Total Help needed to walk in hospital room?: Total Help needed climbing 3-5 steps with a railing? : Total 6 Click Score: 7    End of Session Equipment Utilized During Treatment: Gait belt Activity Tolerance: Patient tolerated treatment well Patient left: in chair;with call bell/phone within reach;with chair alarm set Nurse Communication: Mobility status;Need for lift equipment PT Visit Diagnosis: Other abnormalities of gait and mobility (R26.89);Muscle weakness (generalized) (M62.81);Other symptoms and signs involving the nervous system (R29.898)     Time: 0071-2197 PT Time Calculation (min) (ACUTE ONLY): 27 min  Charges:  $Therapeutic Activity: 8-22 mins                     [name] A. Gilford Rile PT, DPT Acute Rehabilitation Services Pager [phone] Office [phone]    Linna Hoff 01/31/2021, 12:27 PM

## 2021-01-31 NOTE — Consult Note (Signed)
Cut Off Nurse Consult Note: Patient receiving care in [facility] 3W08 Reason for Consult: Non-healing wound of amputation Wound type: Right complete amputation of all toes with slow healing wound Pressure Injury POA: NA Measurement: Open wound measures 7 x 1 x 0.2 cm. Wound bed: The foot has a circumferential incision with the anterior aspect having an open wound that is partially dry crusted and partially open pink and moist with about 10% slough Drainage (amount, consistency, odor) brown drainage on Xeroform Dressing procedure/placement/frequency: Clean the wound with NS and pat dry. Place a Xeroform gauze on the wound. Wrap with Kerlix and change daily.   Monitor the wound area(s) for worsening of condition such as: Signs/symptoms of infection, increase in size, development of or worsening of odor, development of pain, or increased pain at the affected locations.   Notify the medical team if any of these develop.  Thank you for the consult. [place] nurse will not follow at this time.   Please re-consult the [place] team if needed.  Cathlean Marseilles Tamala Julian, MSN, RN, [place], Lysle Pearl, [facility] Wound Treatment Associate Pager [phone]

## 2021-01-31 NOTE — TOC Progression Note (Addendum)
Transition of Care [facility]) - Progression Note    Patient Details  Name: [name] MRN: [id number] Date of Birth: 1971/03/05  Transition of Care [facility]) CM/SW [place], [place] Phone Number: 01/31/2021, 9:13 AM  Clinical Narrative:     CSW called Rolla Plate with [facility] regarding SNF referral. She stated they would consider LOG but pt would have to be reviewed by DON. Pt may need to be sent with 30 day prescription for his Brilenta. CSW resent referral in hub for [facility] to review.   937: [facility] called back; explained they are unable to accept pt at this time due to being unable to manage pt's wound care.   Expected Discharge Plan: [place] Barriers to Discharge: Inadequate or no insurance, Continued Medical Work up  Expected Discharge Plan and Services Expected Discharge Plan: [place]   Discharge Planning Services: CM Consult Post Acute Care Choice: [place] Living arrangements for the past 2 months: Mobile Home                                       Social Determinants of Health (SDOH) Interventions    Readmission Risk Interventions Readmission Risk Prevention Plan 01/30/2021  Post Dischage Appt Complete  Medication Screening Complete  Transportation Screening Complete  Some recent data might be hidden

## 2021-01-31 NOTE — Plan of Care (Signed)

## 2021-01-31 NOTE — Progress Notes (Addendum)
[facility] CONSULTANT PROGRESS NOTE  [name] TGY:[id number] DOB: 1971-05-31 DOA: 01/19/2021 PCP: Coral Spikes, DO  Status: Remains inpatient appropriate because:Unsafe d/c plan  Dispo: The patient is from: Home              Anticipated d/c is to: SNF              Patient currently is medically stable to d/c.   Difficult to place patient Yes  Level of care: Telemetry Medical  Code Status: Full Family Communication: Patient only DVT prophylaxis: Lovenox COVID vaccination status: Unknown   HPI: 50 y.o. male with medical history significant for known CAD s/p remote stents to RCA, presenting with syncope due to high-grade AV block in the setting of acute inferoposterior STEMI due to simultaneous thrombotic occlusion of old RCA stent and native left circumflex coronary artery, treated with emergency PCI and transient intra-aortic balloon pump support cardiac catheterization.  Subjective: Alert and sitting up in bed.  Reports that both he and his sister prefer patient not go to SNF if at all possible.  Sister unfortunately unable to provide 24/7 care.  Currently he is complaining of spasms radiating up from the right leg into the low back and current dose of Robaxin has been ineffective.  Discussed preadmission access to medications and preadmission medications.  He reports that during previous admission in February he was prescribed Lantus to use for 1 month then was informed to switch back over to his usual oral medications.  Regarding outpatient follow-up, patient had been given an application to fill out for one of the clinics in [place] but never completed paperwork and stated he was unable to get transportation to the clinic as well.  We discussed obtaining initial medications from [place] at discharge with focus on most medications being in the affordable range at a [facility] or [facility].  Brilinta and Lyrica currently are medications not available for $4.  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  He also does not own a house or a vehicle.  Objective: Vitals:   01/30/21 2343 01/31/21 0423  BP: 90/62 111/70  Pulse: 72 77  Resp: 18 18  Temp: 98.1 F (36.7 C) 97.7 F (36.5 C)  SpO2: 97% 97%    Intake/Output Summary (Last 24 hours) at 01/31/2021 0746 Last data filed at 01/30/2021 1802 Gross per 24 hour  Intake 595 ml  Output 1050 ml  Net -455 ml   Filed Weights   01/24/21 0444 01/27/21 0500 01/28/21 0409  Weight: 98.6 kg 102.3 kg 102.4 kg    Exam:  Constitutional: NAD, calm, uncomfortable recurrent leg and back spasm Respiratory: clear to auscultation bilaterally, no wheezing, no crackles. Normal respiratory effort. No accessory muscle use.  Room air Cardiovascular: Regular rate and rhythm, no murmurs / rubs / gallops. No extremity edema.  Diminished lower extremity pulses. Abdomen: no tenderness, no masses palpated. Bowel sounds positive. LBM 6/13 Musculoskeletal: No obvious spasticity elicited during examination of RLE. Skin: surgical wound at right transmetatarsal amputation site with eschar and fibrin debris documented. (See picture below) wound not reexamined today given dressing just changed Neurologic: CN 2-12 grossly intact. Sensation diminished from just below the rib cage to the upper legs; patient noted to have essentially no left hand grip but has improved grip on the right but still extremely weak.  General upper extremity strength is 3-4/5 with right  greater than left.  Bilateral lower extremity strength is 4/5 Psychiatric: Normal judgment and insight. Alert and oriented x 3. Normal mood.    Assessment/Plan: Acute problems: Inferior posterior STEMI with associated cardiogenic shock Remains asymptomatic without chest pain Patient reports that since he paid for medications out-of-pocket that he did not consistently take medications  including his cardiac medications intended to stretch out his medication so they would last longer Continue DAPT with aspirin and Brilinta-patient will be able to receive a 1 month free card for Brilinta but will eventually have to complete extensive paperwork to submit to the drug company for drug assistance Continue Lipitor, carvedilol and losartan-all of these medications are available at [facility] for $4 for 30-day supply   Atrial fibrillation Currently maintaining sinus rhythm Cardiology documents atrial fibrillation was transient the context of acute MI; no indication for anticoagulation   Transient third-degree AV block Due to MI and contributed to patient's syncopal presentation No indication for pacemaker   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  Valves are normal. Continue ARB and beta-blocker Currently in a negative balance of 8 L, not requiring diuretic   Acute kidney injury/acute renal insufficiency Creatinine 1.42 with slightly elevated BUN, creatinine was 0.9 on 6/9 Repeat electrolytes on 6/15 with a creatinine of 1.05 and a GFR of greater than 60.   Diabetes mellitus 2 with hyperglycemia on insulin HgbA1c 10.4 A1c in February 11.2 Preadmission lack of access to affordable medications and inconsistent patient administration have contributed to poorly controlled diabetes and endorgan complications of acute MI Plan is to transition back to home oral medications therefore will decrease Lantus to 15 units daily.  Given difficulty affording medications can consider changing to 70/30 insulin will need to continue insulin with OHA's after discharge 6/15 began home glyburide 10 mg daily and metformin 500 mg twice daily Continue NovoLog for meal coverage and SSI Liraglutide would be an excellent medication to use for CAD and  and empagliflozin for heart failure but patient would be unlikely to afford these newer class of drug   Bilateral hand weakness/significant cervical stenosis with foraminal narrowing Appreciate assistance of the neurology team. Work-up this admission revealed significant C-spine stenosis with foraminal narrowing will to consider potential surgical intervention due to need for uninterrupted DAPT after recent MRI Neurology recommended beginning Lyrica and uptitrate as needed for pain management with a max dose of 600 mg/day  6/15 will increase Lyrica to 100 mg 3 times daily  PAD/recent left transmetatarsal amputation Slow to heal likely secondary to poorly controlled diabetes Primarily with eschar in the mid base some open area with red granular tissue and maceration on the incision line posteriorly Patient has offloading boot given to him during previous admission. Appreciate WOC RN assistance  Today he is having significant muscle spasms on the right leg extending up into the back that is not being controlled by current dose of and therefore will DC in favor of scheduled low-dose baclofen-Lyrica increased as above   Physical deconditioning Does not have 24/7 care available after discharge therefore not a candidate for CIR Plan is to pursue additional rehabilitative therapies at a skilled nursing facility Patient is self-pay with Medicaid application in process.  It will likely be difficult to obtain a SNF bed Will initiate STAR of her therapies now so as not to delay potential significant gains; as documented above patient may  not be offered a rehab bed    Dyslipidemia LDL elevated at 136 with normal HDL cholesterol Continue current statin        Data Reviewed: Basic Metabolic Panel: Recent Labs  Lab 01/25/21 0403 01/31/21 0501  NA 137 136  K 3.7 3.7  CL 104 100  CO2 25 26  GLUCOSE 94 102*  BUN 13 16  CREATININE 0.90 1.05  CALCIUM 8.6* 8.8*   Liver Function Tests: No  results for input(s): AST, ALT, ALKPHOS, BILITOT, PROT, ALBUMIN in the last 168 hours. No results for input(s): LIPASE, AMYLASE in the last 168 hours. No results for input(s): AMMONIA in the last 168 hours. CBC: Recent Labs  Lab 01/25/21 0403  WBC 8.7  HGB 13.4  HCT 39.2  MCV 92.0  PLT 195   Cardiac Enzymes: No results for input(s): CKTOTAL, CKMB, CKMBINDEX, TROPONINI in the last 168 hours. BNP (last 3 results) No results for input(s): BNP in the last 8760 hours.  ProBNP (last 3 results) No results for input(s): PROBNP in the last 8760 hours.  CBG: Recent Labs  Lab 01/30/21 0759 01/30/21 1158 01/30/21 1624 01/30/21 2115 01/31/21 0612  GLUCAP 97 132* 101* 105* 101*    No results found for this or any previous visit (from the past 240 hour(s)).   Studies: No results found.  Scheduled Meds:  aspirin EC  81 mg Oral Daily   atorvastatin  80 mg Oral Daily   carvedilol  6.25 mg Oral BID WC   Chlorhexidine Gluconate Cloth  6 each Topical Daily   enoxaparin (LOVENOX) injection  40 mg Subcutaneous Q24H   insulin aspart  0-15 Units Subcutaneous TID WC   insulin aspart  6 Units Subcutaneous TID WC   insulin glargine  30 Units Subcutaneous Daily   losartan  12.5 mg Oral Daily   melatonin  3 mg Oral QHS   pregabalin  75 mg Oral BID   ticagrelor  90 mg Oral BID   Continuous Infusions:  Principal Problem:   STEMI involving right coronary artery (HCC) Active Problems:   Coronary artery disease involving native coronary artery of native heart without angina pectoris   Uncontrolled type 2 diabetes mellitus with circulatory disorder, without long-term current use of insulin (HCC)   Hyperlipidemia   Tobacco abuse   Cardiogenic shock ([place])   Weakness of both arms   Cervical stenosis of spine   Cervical myelopathy [facility])   Physical deconditioning   Consultants: Cardiology Neurology  Procedures: Cardiac catheterization Echocardiogram  Antibiotics: Anti-infectives  (From admission, onward)    None         Time spent: 25 minutes    Erin Hearing ANP  [facility] 7 am - 330 pm/M-F for direct patient care and secure chat Please refer to Amion for contact info 12  days

## 2021-01-31 NOTE — Progress Notes (Signed)
Progress Note  Patient Name: [name] Date of Encounter: 01/31/2021  [facility] Cardiologist: Larae Grooms, MD   Subjective   Feeling well. No chest pain, sob or palpitations.    Inpatient Medications    Scheduled Meds:  aspirin EC  81 mg Oral Daily   atorvastatin  80 mg Oral Daily   baclofen  5 mg Oral TID   carvedilol  6.25 mg Oral BID WC   Chlorhexidine Gluconate Cloth  6 each Topical Daily   enoxaparin (LOVENOX) injection  40 mg Subcutaneous Q24H   glipiZIDE  10 mg Oral QAC breakfast   insulin aspart  0-15 Units Subcutaneous TID WC   insulin aspart  6 Units Subcutaneous TID WC   insulin glargine  15 Units Subcutaneous Daily   losartan  12.5 mg Oral Daily   melatonin  3 mg Oral QHS   metFORMIN  500 mg Oral BID WC   pregabalin  100 mg Oral TID   ticagrelor  90 mg Oral BID   Continuous Infusions:  PRN Meds: acetaminophen, diphenhydrAMINE, HYDROcodone-acetaminophen, nitroGLYCERIN, ondansetron (ZOFRAN) IV, polyethylene glycol, senna-docusate   Vital Signs    Vitals:   01/30/21 1949 01/30/21 2343 01/31/21 0423 01/31/21 0756  BP: (!) 84/62 90/62 111/70 104/74  Pulse: 72 72 77 77  Resp: 18 18 18 20   Temp: 97.7 F (36.5 C) 98.1 F (36.7 C) 97.7 F (36.5 C) (!) 97.5 F (36.4 C)  TempSrc: Oral Oral  Oral  SpO2: 98% 97% 97% 98%  Weight:        Intake/Output Summary (Last 24 hours) at 01/31/2021 1201 Last data filed at 01/30/2021 1802 Gross per 24 hour  Intake 355 ml  Output 650 ml  Net -295 ml   Last 3 Weights 01/28/2021 01/27/2021 01/24/2021  Weight (lbs) 225 lb 12 oz 225 lb 8.5 oz 217 lb 6 oz  Weight (kg) 102.4 kg 102.3 kg 98.6 kg      Telemetry    N/A  ECG    N/A  Physical Exam   GEN: No acute distress.   Neck: No JVD Cardiac: RRR, no murmurs, rubs, or gallops.  Respiratory: Clear to auscultation bilaterally. GI: Soft, nontender, non-distended  MS: No edema; No deformity. Neuro:  Nonfocal.  right foot transmetatarsal amputation,  L arm weakness  Psych: Normal affect   Labs    High Sensitivity Troponin:   Recent Labs  Lab 01/19/21 2014  TROPONINIHS 2,012*      Chemistry Recent Labs  Lab 01/25/21 0403 01/31/21 0501  NA 137 136  K 3.7 3.7  CL 104 100  CO2 25 26  GLUCOSE 94 102*  BUN 13 16  CREATININE 0.90 1.05  CALCIUM 8.6* 8.8*  GFRNONAA >60 >60  ANIONGAP 8 10     Hematology Recent Labs  Lab 01/25/21 0403  WBC 8.7  RBC 4.26  HGB 13.4  HCT 39.2  MCV 92.0  MCH 31.5  MCHC 34.2  RDW 12.8  PLT 195     Radiology    No results found.  Cardiac Studies   Echocardiogram 01/20/2021    1. Left ventricular ejection fraction, by estimation, is 40%. The left  ventricle has moderately decreased function. The left ventricle  demonstrates regional wall motion abnormalities (see scoring  diagram/findings for description). Left ventricular  diastolic parameters are indeterminate. There is severe hypokinesis of the  left ventricular, entire inferior wall and inferolateral wall.   2. Right ventricular systolic function is mildly reduced. The right  ventricular size is normal. Tricuspid regurgitation signal is inadequate  for assessing PA pressure.   3. The mitral valve is normal in structure. No evidence of mitral valve  regurgitation.   4. The aortic valve is tricuspid. Aortic valve regurgitation is not  visualized. Mild aortic valve sclerosis is present, with no evidence of  aortic valve stenosis.   5. The inferior vena cava is dilated in size with <50% respiratory  variability, suggesting right atrial pressure of 15 mmHg.     CATH 01/19/2021   Mid RCA lesion is 100% stenosed- very late stent thrombosis. Aspiration thrombectomy was performed which restored TIMI-3 flow. Balloon angioplasty was performed using a BALLOON [place] [id number]. Post intervention, there is a 0% residual stenosis. Mid Cx lesion is 100% stenosed. A drug-eluting stent was successfully placed using a STENT RESOLUTE  ONYX 2.5X15, postdilated to greater than 3 mm. Post intervention, there is a 0% residual stenosis. LV end diastolic pressure is mildly elevated. There is no aortic valve stenosis. Temporary pacemaker placement was used. Heart rate improved after PCI of the RCA. The temporary pacer was removed. Successful placement of the intra-aortic balloon pump.   Dual antiplatelet therapy for 12 months along with aggressive secondary prevention.  Very late stent thrombosis of the RCA likely due to his antiplatelet therapy being stopped back in February.  Second acute occlusion of the circumflex.     Transfer to [facility] given his intra-aortic balloon pump.   Diagnostic Dominance: Right      Intervention         Patient Profile     50 y.o. male with known CAD s/p remote stents to RCA, presenting with syncope due to high-grade AV block in the setting of acute inferoposterior STEMI due to simultaneous thrombotic occlusion of old RCA stent and native left circumflex coronary artery, treated with emergency PCI and transient intra-aortic balloon pump support cardiac catheterization.  Assessment & Plan    1.  Inf-post STEMI: unusual synchronous 2 vessel thrombotic occlusion. Occurred after 2 weeks off clopidogrel, but the RCA stent is 50 years old- formerly place in [employer]. Consider embolic event since he had transient atrial fibrillation, but he is young and LA not dilated.  Currently asymptomatic and hemodynamically stable. Per Dr. Irish Lack: "There appeared to be two acute vessel occlusions, the RCA and the circumflex. The cath showed late stent thrombosis of his RCA stent.  Aspiration thrombectomy was performed followed by balloon angioplasty.  TIMI-3 flow was restored.  I elected not to stent the RCA given that the circumflex was still occluded and he remained hypotensive.  It was not practical to perform intravascular imaging at that point due to his condition so I was not sure if his stent was  undersized and that was the mode of his very late stent thrombosis".  -Continue dual antiplatelet therapy with aspirin and Brilinta -Continue Lipitor 80 mg daily -Continue carvedilol 6.25 mg twice daily and losartan 12.5 mg daily   2. Cardiogenic shock:  Resolved.     3. AFib: transient, likely AMI related.  No recurrence since he came to [facility].  If he has recurrent AFib, would switch to clopidogrel+DOAC.   4. 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  No indication for pacemaker. Resolved.   5. LV dysfunction- Acute on chronic combined systolic/diastolic CHF: EF 87% and intermediate diastolic dysfunction by echocardiogram.  No evidence of CHF clinically and no clear evidence for increased LA filling pressures on echo. EDP 21-25  at time of cath but acutely decompensated then. Renal function improved and normalized. Envolmic.  -Continue carvedilol and losartan.     6. AKI:  Renal function is back to normal.   7. PAD: s/p R transmetatarsal amputation.   8. HLP: supposed to be on statin; LDL 136 (was 67 in February) suggests noncompliance. Will resume lipitor at higher dose of 80 mg daily. Lipid panel and LFTs in 8 weeks.    9. Bilateral hand weakness:  Appreciate Neuro evaluation. Significant cervical spine stenosis and foraminal narrowing. May eventually need to consider surgery  but given recent MI and need for uninterrupted DAPT, did don't felt he can have surgery any time soon. At a minimum would need uninterrupted DAPT for at least 6 months.  Change gabapentin to Lyrica team. Appreciate IM input.   10. DM: on insulin. Poorly controlled, A1c 11.2 in February. Now 10.4.  Noncompliant with insulin therapy.  Appreciate IM / Neuro help.  Major issue now is affordability of drugs and placement trending down.  Cardiology issues have remained stable.  For questions or updates, please contact [place] Please consult [email or web address] for contact info under         Signed, Quay Burow, MD  01/31/2021, 12:01 PM

## 2021-01-31 NOTE — TOC Progression Note (Addendum)
Transition of Care [facility]) - Progression Note    Patient Details  Name: [name] MRN: [id number] Date of Birth: Dec 14, 1970  Transition of Care [facility]) CM/SW [place], RN Phone Number: 01/31/2021, 12:33 PM  Clinical Narrative:    Case management called and set up patient for follow up at [facility] for hospital follow up after discharge.  I'm going to provide the patient with an application for a free clinic in [facility] for a later date as well.  I spoke with Kevan Rosebush, [facility] supervisor and asked that the patient be placed on the [place] rehabilitation team for daily mobilization.  I discussed the patient's case with Pete Pelt, MSW supervisor as well the an LOG could be provided for 2 weeks of physical rehabilitation at an accepting SNF facility.  I called and left a message with a few facilities including:  [facility] Choice facilities to include [facility] facilities in [place] will continue to follow the patient for transitions of care.  The patient will need to be provided with medication assistance for Brilinta through [place] before discharging to rehabilitation or home.  01/31/2021 1241 - I spoke with Vara Guardian, CM at [facility] in [place], [place] and she is willing to review the clinicals and consider LOG for 2 week placement.  CM will follow up if they are willing to offer a bed to the patient.   Expected Discharge Plan: [place] Barriers to Discharge: Inadequate or no insurance, Continued Medical Work up  Expected Discharge Plan and Services Expected Discharge Plan: [place]   Discharge Planning Services: CM Consult Post Acute Care Choice: [place] Living arrangements for the past 2 months: Mobile Home                                       Social Determinants of Health (SDOH)  Interventions    Readmission Risk Interventions Readmission Risk Prevention Plan 01/30/2021  Post Dischage Appt Complete  Medication Screening Complete  Transportation Screening Complete  Some recent data might be hidden

## 2021-01-31 NOTE — Progress Notes (Signed)
Occupational Therapy Treatment Patient Details Name: [name] MRN: [id number] DOB: 01-01-1971 Today's Date: 01/31/2021    History of present illness 50 yo admitted 6/4 with STEMI with in stent thrombosis with Afib with RVR needing IABP. 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 CT: C5-6: Moderate to large broad-based disc protrusion asymmetric left  with mass effect on the ventral thecal sac and obliteration of the  ventral CSF space. PMhx: HTN, HLD, NASH cirrhosis, DM, neuropathy, cellulitis, Rt transmet that has dressing on it.   OT comments  Patient with incremental progress toward patient focused OT goals.  Barriers remain as listed below.  Patient continues to work hard, standing in the STEDY with min to mod A of 2.  He is beginning to work on weight shifting and attempting to offload weight from each foot in preparation for improved lower body ADL and toileting.  He continues to use built up handles for meals, and was able to stand in the [facility] for sink side grooming.  SNF continues to be recommended because he needs 24 hour heavy assist for post acute rehab.  OT will continue to follow in the acute setting to maximize functional abilities.    Follow Up Recommendations  SNF;Supervision/Assistance - 24 hour    Equipment Recommendations  Other (comment)    Recommendations for Other Services      Precautions / Restrictions Precautions Precautions: Fall Precaution Comments: bil UE and LE weakness Restrictions Weight Bearing Restrictions: No       Mobility Bed Mobility               General bed mobility comments: Sitting in recliner on arrival Patient Response: Cooperative  Transfers Overall transfer level: Needs assistance Equipment used: Ambulation equipment used Transfers: Sit to/from Stand Sit to Stand: Min assist;Mod assist;+2 physical assistance         General transfer comment: From recliner modA+2  and minA+2 from [place] flaps. Worked on weight shifting in standing with UE support with progression to small steps in [place]. Verbal and tactile cues for upright posture. Performed mini squats x 5. Sit to stand x 5-6 total during session.    Balance Overall balance assessment: Needs assistance Sitting-balance support: No upper extremity supported;Feet supported Sitting balance-Leahy Scale: Fair     Standing balance support: Bilateral upper extremity supported Standing balance-Leahy Scale: Poor Standing balance comment: min-modA to maintain standing in [place] with B UE support                           ADL either performed or assessed with clinical judgement   ADL       Grooming: Minimal assistance;Standing;Sitting;Wash/dry hands;Wash/dry face Grooming Details (indicate cue type and reason): STEDY             Lower Body Dressing: Sitting/lateral leans;Maximal assistance               Functional mobility during ADLs: +2 for safety/equipment;Moderate assistance;+2 for physical assistance       Vision       Perception     Praxis      Cognition Arousal/Alertness: Awake/alert Behavior During Therapy: WFL for tasks assessed/performed Overall Cognitive Status: Within Functional Limits for tasks assessed  Pertinent Vitals/ Pain       Pain Assessment: Faces Faces Pain Scale: Hurts even more Pain Location: LE spasms Pain Descriptors / Indicators: Grimacing;Guarding;Spasm Pain Intervention(s): Monitored during session                                                          Frequency  Min 2X/week        Progress Toward Goals  OT Goals(current goals can now be found in the care plan section)  Progress towards OT goals: Progressing toward goals  Acute Rehab OT Goals Patient Stated Goal: be able to walk OT Goal Formulation: With  patient Time For Goal Achievement: 02/05/21 Potential to Achieve Goals: [place] Discharge plan remains appropriate    Co-evaluation    PT/OT/SLP Co-Evaluation/Treatment: Yes Reason for Co-Treatment: Complexity of the patient's impairments (multi-system involvement);For patient/therapist safety PT goals addressed during session: Mobility/safety with mobility;Balance;Strengthening/ROM OT goals addressed during session: ADL's and self-care;Proper use of Adaptive equipment and DME      AM-PAC OT "6 Clicks" Daily Activity     Outcome Measure   Help from another person eating meals?: A Little Help from another person taking care of personal grooming?: A Little Help from another person toileting, which includes using toliet, bedpan, or urinal?: Total Help from another person bathing (including washing, rinsing, drying)?: A Lot Help from another person to put on and taking off regular upper body clothing?: A Lot Help from another person to put on and taking off regular lower body clothing?: Total 6 Click Score: 12    End of Session Equipment Utilized During Treatment: Other (comment)  OT Visit Diagnosis: Other abnormalities of gait and mobility (R26.89);Unsteadiness on feet (R26.81);Muscle weakness (generalized) (M62.81);Pain;Other (comment) Pain - Right/Left: Right   Activity Tolerance Patient tolerated treatment well   Patient Left in chair;with call bell/phone within reach;with chair alarm set   Nurse Communication Mobility status        Time: 7782-4235 OT Time Calculation (min): 27 min  Charges: OT General Charges $OT Visit: 1 Visit OT Treatments $Self Care/Home Management : 8-22 mins  01/31/2021  Rich, OTR/L  Acute Rehabilitation Services  Office:  [phone]    Metta Clines 01/31/2021, 2:35 PM

## 2021-02-01 ENCOUNTER — Other Ambulatory Visit (HOSPITAL_COMMUNITY): Payer: Self-pay

## 2021-02-01 LAB — GLUCOSE, CAPILLARY
Glucose-Capillary: 135 mg/dL — ABNORMAL HIGH (ref 70–99)
Glucose-Capillary: 107 mg/dL — ABNORMAL HIGH (ref 70–99)
Glucose-Capillary: 84 mg/dL (ref 70–99)
Glucose-Capillary: 115 mg/dL — ABNORMAL HIGH (ref 70–99)
Glucose-Capillary: 192 mg/dL — ABNORMAL HIGH (ref 70–99)

## 2021-02-01 MED ORDER — BACLOFEN 5 MG PO TABS: 5.0000 mg | ORAL_TABLET | Freq: Three times a day (TID) | ORAL | 0 refills | Status: DC

## 2021-02-01 MED ORDER — ATORVASTATIN CALCIUM 80 MG PO TABS: 80.0000 mg | ORAL_TABLET | Freq: Every day | ORAL | Status: DC

## 2021-02-01 MED ORDER — LOSARTAN POTASSIUM 25 MG PO TABS: 12.5000 mg | ORAL_TABLET | Freq: Every day | ORAL | Status: DC

## 2021-02-01 MED ORDER — PREGABALIN 100 MG PO CAPS: 100.0000 mg | ORAL_CAPSULE | Freq: Three times a day (TID) | ORAL | 0 refills | Status: DC

## 2021-02-01 MED ORDER — NITROGLYCERIN 0.4 MG SL SUBL: 0.4000 mg | SUBLINGUAL_TABLET | SUBLINGUAL | 12 refills | Status: DC | PRN

## 2021-02-01 MED ORDER — INSULIN ASPART 100 UNIT/ML IJ SOLN: 0.0000 [IU] | Freq: Three times a day (TID) | INTRAMUSCULAR | 11 refills | Status: DC

## 2021-02-01 MED ORDER — CARVEDILOL 6.25 MG PO TABS: 6.2500 mg | ORAL_TABLET | Freq: Two times a day (BID) | ORAL | Status: DC

## 2021-02-01 MED ORDER — TICAGRELOR 90 MG PO TABS: 90.0000 mg | ORAL_TABLET | Freq: Two times a day (BID) | ORAL | 12 refills | Status: DC

## 2021-02-01 MED ORDER — POLYETHYLENE GLYCOL 3350 17 G PO PACK: 17.0000 g | PACK | Freq: Every day | ORAL | 0 refills | Status: DC | PRN

## 2021-02-01 MED ORDER — HYDROCODONE-ACETAMINOPHEN 5-325 MG PO TABS: 1.0000 | ORAL_TABLET | Freq: Four times a day (QID) | ORAL | 0 refills | Status: DC | PRN

## 2021-02-01 MED ORDER — ASPIRIN 81 MG PO TBEC: 81.0000 mg | DELAYED_RELEASE_TABLET | Freq: Every day | ORAL | 11 refills | Status: DC

## 2021-02-01 MED ORDER — MELATONIN 3 MG PO TABS: 3.0000 mg | ORAL_TABLET | Freq: Every day | ORAL | 0 refills | Status: DC

## 2021-02-01 MED ORDER — SENNOSIDES-DOCUSATE SODIUM 8.6-50 MG PO TABS: 2.0000 | ORAL_TABLET | Freq: Every evening | ORAL | Status: DC | PRN

## 2021-02-01 NOTE — TOC Progression Note (Signed)
Transition of Care [facility]) - Progression Note    Patient Details  Name: [name] MRN: [id number] Date of Birth: [birth year]  Transition of Care [facility]) CM/SW [place], RN Phone Number: 02/01/2021, 10:38 AM  Clinical Narrative:    Case management met with the patient at the bedside to discuss options for SNF placement and rehabilitation for 2 weeks at an accepting SNF facility.  I left a message with [facility] in [place] and [place], CM states that she is waiting to hear back from the corporate office for possible bed offer.  [facility] would be patient's first choice at this time since it is close to home but the patient is agreeable to other facilities as well.  I called and spoke with Maudie Mercury, CM at [facility] and they will not have a SNF rehabilitation bed for him for 1-2 days but they will reach out to me if a bed becomes available before that time.  I set the patient up for a hospital follow up with [facility] on 03/09/2021 at 0930 am.  The patient is concerned about paying for his medications.  The patient was given a financial assistance application for Brilinta and a 30 day free card for Brilinta as well.  The patient expressed that eventually he would like to follow up at the free health clinic in [place] at a later date - I have him the application to fill out and send in at a later date once he was able to return home after rehabilitation at the SNF facility.  I explained to the patient that he would be given assistance for placement at a rehabilitation facility, but his family would need to make plans to provide 24-hour care after his rehabilitation stay at the SNF.  The patient states that his sister was been asking family and friends for assistance at this time.  The patient will need 24 hour supervision for mobility concerns.  CM with DTP Team will continue to follow the patient for [facility] placement for 2 weeks of  rehabilitation under an LOG.   Expected Discharge Plan: [place] Barriers to Discharge: Inadequate or no insurance, Continued Medical Work up  Expected Discharge Plan and Services Expected Discharge Plan: [place] In-house Referral: Clinical Social Work, PCP / Psychologist, educational, Conservation officer, nature Services: Follow-up appt scheduled, Medication Assistance, [facility] Post Acute Care Choice: [place] Living arrangements for the past 2 months: Mobile Home                                       Social Determinants of Health (SDOH) Interventions    Readmission Risk Interventions Readmission Risk Prevention Plan 01/30/2021  Post Dischage Appt Complete  Medication Screening Complete  Transportation Screening Complete  Some recent data might be hidden

## 2021-02-01 NOTE — Progress Notes (Addendum)
Progress Note  Patient Name: [name] Date of Encounter: 02/01/2021  [facility] Cardiologist: Larae Grooms, MD   Subjective   Sitting up in the chair. No complaints, hopeful for discharge to SNF soon.   Inpatient Medications    Scheduled Meds:  aspirin EC  81 mg Oral Daily   atorvastatin  80 mg Oral Daily   baclofen  5 mg Oral TID   carvedilol  6.25 mg Oral BID WC   Chlorhexidine Gluconate Cloth  6 each Topical Daily   enoxaparin (LOVENOX) injection  40 mg Subcutaneous Q24H   glipiZIDE  10 mg Oral QAC breakfast   insulin aspart  0-15 Units Subcutaneous TID WC   losartan  12.5 mg Oral Daily   melatonin  3 mg Oral QHS   metFORMIN  500 mg Oral BID WC   pregabalin  100 mg Oral TID   ticagrelor  90 mg Oral BID   Continuous Infusions:  PRN Meds: acetaminophen, diphenhydrAMINE, HYDROcodone-acetaminophen, nitroGLYCERIN, ondansetron (ZOFRAN) IV, polyethylene glycol, senna-docusate   Vital Signs    Vitals:   01/31/21 2348 02/01/21 0405 02/01/21 0728 02/01/21 1154  BP:  94/62 100/71 95/68  Pulse: 73 68 70 72  Resp:  18  16  Temp:  98 F (36.7 C) 98 F (36.7 C) 98.3 F (36.8 C)  TempSrc:  Oral Oral Oral  SpO2: 97% 97% 98% 100%  Weight:        Intake/Output Summary (Last 24 hours) at 02/01/2021 1424 Last data filed at 02/01/2021 0428 Gross per 24 hour  Intake --  Output 475 ml  Net -475 ml   Last 3 Weights 01/28/2021 01/27/2021 01/24/2021  Weight (lbs) 225 lb 12 oz 225 lb 8.5 oz 217 lb 6 oz  Weight (kg) 102.4 kg 102.3 kg 98.6 kg      Telemetry    SR - Personally Reviewed  ECG    No new tracing.   Physical Exam   GEN: No acute distress.   Neck: No JVD Cardiac: RRR, no murmurs, rubs, or gallops.  Respiratory: Clear to auscultation bilaterally. GI: Soft, nontender, non-distended  MS: No edema; Right transmetatarsal amp Neuro:  Nonfocal  Psych: Normal affect   Labs    High Sensitivity Troponin:   Recent Labs  Lab 01/19/21 2014   TROPONINIHS 2,012*      Chemistry Recent Labs  Lab 01/31/21 0501  NA 136  K 3.7  CL 100  CO2 26  GLUCOSE 102*  BUN 16  CREATININE 1.05  CALCIUM 8.8*  GFRNONAA >60  ANIONGAP 10     HematologyNo results for input(s): WBC, RBC, HGB, HCT, MCV, MCH, MCHC, RDW, PLT in the last 168 hours.  BNPNo results for input(s): BNP, PROBNP in the last 168 hours.   DDimer No results for input(s): DDIMER in the last 168 hours.   Radiology    No results found.  Cardiac Studies   Echocardiogram: 01/20/2021    1. Left ventricular ejection fraction, by estimation, is 40%. The left  ventricle has moderately decreased function. The left ventricle  demonstrates regional wall motion abnormalities (see scoring  diagram/findings for description). Left ventricular  diastolic parameters are indeterminate. There is severe hypokinesis of the  left ventricular, entire inferior wall and inferolateral wall.   2. Right ventricular systolic function is mildly reduced. The right  ventricular size is normal. Tricuspid regurgitation signal is inadequate  for assessing PA pressure.   3. The mitral valve is normal in structure. No evidence of  mitral valve  regurgitation.   4. The aortic valve is tricuspid. Aortic valve regurgitation is not  visualized. Mild aortic valve sclerosis is present, with no evidence of  aortic valve stenosis.   5. The inferior vena cava is dilated in size with <50% respiratory  variability, suggesting right atrial pressure of 15 mmHg.     CATH: 01/19/2021   Mid RCA lesion is 100% stenosed- very late stent thrombosis. Aspiration thrombectomy was performed which restored TIMI-3 flow. Balloon angioplasty was performed using a BALLOON [place] Maurice [id number]. Post intervention, there is a 0% residual stenosis. Mid Cx lesion is 100% stenosed. A drug-eluting stent was successfully placed using a STENT RESOLUTE ONYX 2.5X15, postdilated to greater than 3 mm. Post intervention, there is a  0% residual stenosis. LV end diastolic pressure is mildly elevated. There is no aortic valve stenosis. Temporary pacemaker placement was used. Heart rate improved after PCI of the RCA. The temporary pacer was removed. Successful placement of the intra-aortic balloon pump.   Dual antiplatelet therapy for 12 months along with aggressive secondary prevention.  Very late stent thrombosis of the RCA likely due to his antiplatelet therapy being stopped back in February.  Second acute occlusion of the circumflex.     Transfer to [facility] given his intra-aortic balloon pump.   Diagnostic Dominance: Right      Intervention           Patient Profile     50 y.o. male with known CAD s/p remote stents to RCA, presenting with syncope due to high-grade AV block in the setting of acute inferoposterior STEMI due to simultaneous thrombotic occlusion of old RCA stent and native left circumflex coronary artery, treated with emergency PCI and transient intra-aortic balloon pump support cardiac catheterization.  Assessment & Plan   Inferior STEMI: Presented after 2 weeks of stopping clopidogrel.  Two-vessel thrombotic occlusion.  This appeared to be 2 acute vessel occlusions in the RCA and circumflex.  Cath showed late stent thrombosis of the RCA stent.  Underwent aspiration thrombectomy which was followed by balloon angioplasty with TIMI-3 flow restored.  PCI/DES x1 placed to mid left circumflex.  Placed on dual antiplatelet therapy with aspirin and Brilinta for at least 1 year. --Continue aspirin/Brilinta, Lipitor 80 mg daily, carvedilol 6.25 mg twice daily and losartan 12.5 mg daily  Cardiogenic shock: Initially required balloon pump, was able to wean and tolerated the addition of BB and ARB  Paroxysmal atrial fibrillation: Transient, suspect it was related to acute MI.  No recurrence during admission.  Given his unusual presentation, was suggested that he discharged with 30-day monitor as an  outpatient to assess for recurrent atrial fibrillation.  Third-degree AV block: Transient, ischemic mechanism.  Resolved and able to tolerate beta-blocker.  Ischemic cardiomyopathy: Echo on 6/4 noted EF of 40% with hypokinesis of the LV, entire inferior wall as well as lateral wall involvement.  -- Coreg 6.25 mg twice daily, losartan 12.5 mg daily  AKI: Creatinine peaked at 1.4.  Improved to within normal limits prior to discharge  Hyperlipidemia: LDL 136, noncompliance with statin prior to admission --Atorvastatin 80 mg daily --We will need fasting lipid panel and LFTs in 8 weeks  Bilateral hand weakness: Significant cervical spine stenosis and foraminal narrowing.  Evaluated by neurology during admission.  May eventually need to consider surgery but with recent MI and need for uninterrupted DAPT this will have to be delayed a minimum of 6 months. --Gabapentin was switched to Lyrica  Diabetes: Poorly  controlled, globin A1c 10.4 --SSI  Dispo: Initially recommendation was for CIR but patient did not qualify due to financial issues.  Plan for SNF, pending bed placement.  He has multiple social stressors including lack of transportation, no medical coverage, and currently out of work.  Discussed with the [facility] Pharm.[facility] he does qualify to be followed in their clinic.  They will assist with financial and social issues at discharge.  For questions or updates, please contact [place] Please consult [email or web address] for contact info under        Signed, Reino Bellis, NP  02/01/2021, 2:24 PM     Agree with note by Reino Bellis NP-C  Cardiology stable. Awaiting SNF placement.  Lorretta Harp, M.D., [place], [facility], [place] [address] [place], [place]  [id number] , [phone] 02/01/2021 5:17 PM

## 2021-02-01 NOTE — Progress Notes (Signed)
Pt legs noted to have significantly increased edema bilateral, nonpitting, than this morning/yesterday. Assessed: pt reports no pain, skin cool to touch, L foot mottling, O2sat on toe 100%, paged MD [facility] night. Pt has been sitting in chair most of day, refuses SCDs and to elevate legs due to leg pain, and desire to shift position throughout day, educated to wear SCDs. Will continue to monitor/educate.

## 2021-02-01 NOTE — Progress Notes (Addendum)
[facility] CONSULTANT PROGRESS NOTE  [name] UJW:[id number] DOB: October 23, 1970 DOA: 01/19/2021 PCP: Coral Spikes, DO  Status: Remains inpatient appropriate because:Unsafe d/c plan  Dispo: The patient is from: Home              Anticipated d/c is to: SNF              Patient currently is medically stable to d/c.   Difficult to place patient Yes  Level of care: Telemetry Medical  Code Status: Full Family Communication: Patient only DVT prophylaxis: Lovenox COVID vaccination status: Unknown   HPI: 50 y.o. male with medical history significant for known CAD s/p remote stents to RCA, presenting with syncope due to high-grade AV block in the setting of acute inferoposterior STEMI due to simultaneous thrombotic occlusion of old RCA stent and native left circumflex coronary artery, treated with emergency PCI and transient intra-aortic balloon pump support cardiac catheterization.  Subjective: Reporting that spasticity has improved and grip also seems better with changes in medications.  Answered regarding possible upcoming discharge to skilled nursing facility.  Objective: Vitals:   02/01/21 0405 02/01/21 0728  BP: 94/62 100/71  Pulse: 68 70  Resp: 18   Temp: 98 F (36.7 C) 98 F (36.7 C)  SpO2: 97% 98%    Intake/Output Summary (Last 24 hours) at 02/01/2021 0740 Last data filed at 02/01/2021 0428 Gross per 24 hour  Intake --  Output 475 ml  Net -475 ml   Filed Weights   01/24/21 0444 01/27/21 0500 01/28/21 0409  Weight: 98.6 kg 102.3 kg 102.4 kg    Exam:  Constitutional: Alert and calm.  Sitting up in chair Respiratory:.  Lungs remain clear.  Stable on room air Cardiovascular: Normal heart sounds, regular pulse, no peripheral edema. Abdomen: no tenderness, no masses palpated. Bowel sounds positive. LBM 6/13 Musculoskeletal: No obvious spasticity elicited during examination of RLE. Skin: surgical wound at right transmetatarsal amputation site with eschar and  fibrin debris documented. (See picture below) wound not reexamined today given dressing just changed Neurologic: CN 2-12 grossly intact. Sensation diminished from just below the rib cage to the upper legs; bilateral hand grip has improved with right now being 3-4/5 and left being 3/5.  General upper extremity strength is 3-4/5 with right greater than left.  Bilateral lower extremity strength is 4/5 Psychiatric: Normal judgment and insight. Alert and oriented x 3. Normal mood.    Assessment/Plan: Acute problems: Inferior posterior STEMI with associated cardiogenic shock Remains asymptomatic without chest pain Continue DAPT with aspirin and Brilinta-FREE CARD WILL BE LEFT IN CHART SO PT CAN OBTAIN AFTER DC FROM SNF He also has been given the for drug assistance from the [employer] Continue Lipitor, carvedilol and losartan-all of these medications are available at [facility] for $4 for 30-day supply   Atrial fibrillation Currently maintaining sinus rhythm Cardiology documents atrial fibrillation was transient the context of acute MI; no indication for anticoagulation   Transient third-degree AV block Due to MI and contributed to patient's syncopal presentation No indication for pacemaker   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  Valves are normal. Continue ARB and beta-blocker   Acute kidney injury/acute renal insufficiency Creatinine 1.42 with slightly elevated BUN, creatinine was 0.9 on 6/9 Repeat electrolytes on 6/15 with a creatinine of 1.05 and a GFR of greater than 60.   Diabetes mellitus  2 with hyperglycemia on insulin HgbA1c 10.4 A1c in February 11.2 Preadmission lack of access to affordable medications and inconsistent patient administration have contributed to poorly controlled diabetes and endorgan complications  of acute MI CBG's on the lower side 6/15 even after Lantus decreased so insulin dc'd in favor of preadmit OHAs Continue glyburide 10 mg daily and metformin 500 mg twice daily at discharge Continue SSI Liraglutide would be an excellent medication to use for CAD and and empagliflozin for heart failure but patient would be unlikely to afford these newer class of drug   Bilateral hand weakness/significant cervical stenosis with foraminal narrowing Appreciate assistance of the neurology team. Work-up this admission revealed significant C-spine stenosis with foraminal narrowing will to consider potential surgical intervention due to need for uninterrupted DAPT after recent MRI Neurology recommended beginning Lyrica and uptitrate as needed for pain management with a max dose of 600 mg/day  6/15 increased Lyrica to 100 mg 3 times daily Likely muscle spasms reflect spasticity from cervical stenosis- after dc to SNF the facility provider may need to further uptitrate Baclofen -this medication has already improved overall muscle spasticity and bilateral hand grip related to spasticity -rec OT and PT after dc Given his current mobility issues secondary to recent TMA with further functional mobility issues compounded by weakness and neurological deficits from significant cervical stenosis it is highly doubtful that this patient will be able to return to work.  He is unable to undergo surgical repair due to need for DAPT and it is unclear if deficits will resolve after surgery surgery may only prevent further worsening of his deficits.  This patient has completed disability applications which is appropriate  PAD/recent left transmetatarsal amputation Slow to heal likely secondary to poorly controlled diabetes Primarily with eschar in the mid base some open area with red granular tissue and maceration on the incision line posteriorly Patient has offloading boot given to him during previous admission. Appreciate  WOC RN assistance  Continue Baclofen for muscle spasms on the right leg extending up into the back -Lyrica increased as above Likely muscle spasms reflect spasticity from cervical stenosis   Physical deconditioning Does not have 24/7 care available after discharge therefore not a candidate for CIR Plan is to pursue additional rehabilitative therapies at a skilled nursing facility Patient is self-pay with Medicaid application in process.  It will likely be difficult to obtain a SNF bed Will initiate STAR of her therapies now so as not to delay potential significant gains; as documented above patient may not be offered a rehab bed    Dyslipidemia LDL elevated at 136 with normal HDL cholesterol Continue current statin        Data Reviewed: Basic Metabolic Panel: Recent Labs  Lab 01/31/21 0501  NA 136  K 3.7  CL 100  CO2 26  GLUCOSE 102*  BUN 16  CREATININE 1.05  CALCIUM 8.8*   Liver Function Tests: No results for input(s): AST, ALT, ALKPHOS, BILITOT, PROT, ALBUMIN in the last 168 hours. No results for input(s): LIPASE, AMYLASE in the last 168 hours. No results for input(s): AMMONIA in the last 168 hours. CBC: No results for input(s): WBC, NEUTROABS, HGB, HCT, MCV, PLT in the last 168 hours.  Cardiac Enzymes: No results for input(s): CKTOTAL, CKMB, CKMBINDEX, TROPONINI in the last 168 hours. BNP (last 3 results) No results for input(s): BNP in the last 8760 hours.  ProBNP (last 3 results) No results for input(s): PROBNP in the last 8760 hours.  CBG:  Recent Labs  Lab 01/31/21 1306 01/31/21 1616 01/31/21 1640 01/31/21 2130 02/01/21 0607  GLUCAP 110* 67* 81 133* 135*    No results found for this or any previous visit (from the past 240 hour(s)).   Studies: No results found.  Scheduled Meds:  aspirin EC  81 mg Oral Daily   atorvastatin  80 mg Oral Daily   baclofen  5 mg Oral TID   carvedilol  6.25 mg Oral BID WC   Chlorhexidine Gluconate Cloth  6 each  Topical Daily   enoxaparin (LOVENOX) injection  40 mg Subcutaneous Q24H   glipiZIDE  10 mg Oral QAC breakfast   insulin aspart  0-15 Units Subcutaneous TID WC   insulin aspart  6 Units Subcutaneous TID WC   losartan  12.5 mg Oral Daily   melatonin  3 mg Oral QHS   metFORMIN  500 mg Oral BID WC   pregabalin  100 mg Oral TID   ticagrelor  90 mg Oral BID   Continuous Infusions:  Principal Problem:   STEMI involving right coronary artery (HCC) Active Problems:   Coronary artery disease involving native coronary artery of native heart without angina pectoris   Uncontrolled type 2 diabetes mellitus with circulatory disorder, without long-term current use of insulin (HCC)   Hyperlipidemia   Tobacco abuse   Cardiogenic shock ([employer])   Weakness of both arms   Cervical stenosis of spine   Cervical myelopathy [facility])   Physical deconditioning   Consultants: Cardiology Neurology  Procedures: Cardiac catheterization Echocardiogram  Antibiotics: Anti-infectives (From admission, onward)    None         Time spent: 25 minutes    Erin Hearing ANP  [facility] 7 am - 330 pm/M-F for direct patient care and secure chat Please refer to Amion for contact info 13  days

## 2021-02-01 NOTE — Progress Notes (Signed)
Physical Therapy Treatment Patient Details Name: [name] MRN: [id number] DOB: [birth year] Today's Date: 02/01/2021    History of Present Illness 50 yo admitted 6/4 with STEMI with in stent thrombosis with Afib with RVR needing IABP. 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 CT: C5-6: Moderate to large broad-based disc protrusion asymmetric left  with mass effect on the ventral thecal sac and obliteration of the  ventral CSF space. PMhx: HTN, HLD, NASH cirrhosis, DM, neuropathy, cellulitis, Rt transmet that has dressing on it.    PT Comments    Patient with increased weakness and decreased sensation in bilateral LE. Patient requiring more assistance this session for sit to stand transfer with [place]. Worked on pre-gait activities of weight shifting but patient with L knee buckling resulting in seated rest break in [place]. Notified MD about presentation. Continue to recommend SNF for ongoing Physical Therapy.       Follow Up Recommendations  SNF     Equipment Recommendations  Wheelchair cushion (measurements PT);Wheelchair (measurements PT);Hospital bed    Recommendations for Other Services       Precautions / Restrictions Precautions Precautions: Fall Precaution Comments: bil UE and LE weakness Restrictions Weight Bearing Restrictions: No    Mobility  Bed Mobility               General bed mobility comments: Sitting in recliner on arrival    Transfers Overall transfer level: Needs assistance Equipment used: Ambulation equipment used Transfers: Sit to/from Stand Sit to Stand: Mod assist;Max assist;+2 physical assistance;+2 safety/equipment         General transfer comment: maxA+2 to stand from low recliner with B UE support in [place]. Once in [place], patient required modA+2 for repeated sit to stands and mini squats. L knee buckling x1 with weight shifting requiring seated rest break.  Ambulation/Gait                  Stairs             Wheelchair Mobility    Modified Rankin (Stroke Patients Only)       Balance Overall balance assessment: Needs assistance Sitting-balance support: No upper extremity supported;Feet supported Sitting balance-Leahy Scale: Fair     Standing balance support: Bilateral upper extremity supported Standing balance-Leahy Scale: Poor Standing balance comment: requires mod-maxA to maintain standing in [place] with B UE support                            Cognition Arousal/Alertness: Awake/alert Behavior During Therapy: WFL for tasks assessed/performed Overall Cognitive Status: Within Functional Limits for tasks assessed                                        Exercises Other Exercises Other Exercises: mini squats x 5 with B UE support    General Comments        Pertinent Vitals/Pain Pain Assessment: Faces Faces Pain Scale: Hurts whole lot Pain Location: back spasms Pain Descriptors / Indicators: Grimacing;Guarding;Spasm Pain Intervention(s): Monitored during session;Repositioned    Home Living                      Prior Function            PT Goals (current goals can now be found in  the care plan section) Acute Rehab PT Goals Patient Stated Goal: be able to walk PT Goal Formulation: With patient Time For Goal Achievement: 02/05/21 Potential to Achieve Goals: Fair Progress towards PT goals: Progressing toward goals    Frequency    Min 3X/week      PT Plan Current plan remains appropriate    Co-evaluation              AM-PAC PT "6 Clicks" Mobility   Outcome Measure  Help needed turning from your back to your side while in a flat bed without using bedrails?: A Lot Help needed moving from lying on your back to sitting on the side of a flat bed without using bedrails?: Total Help needed moving to and from a bed to a chair (including a wheelchair)?: Total Help needed standing up  from a chair using your arms (e.g., wheelchair or bedside chair)?: Total Help needed to walk in hospital room?: Total Help needed climbing 3-5 steps with a railing? : Total 6 Click Score: 7    End of Session Equipment Utilized During Treatment: Gait belt Activity Tolerance: Patient tolerated treatment well Patient left: in chair;with call bell/phone within reach;with chair alarm set Nurse Communication: Mobility status;Need for lift equipment PT Visit Diagnosis: Other abnormalities of gait and mobility (R26.89);Muscle weakness (generalized) (M62.81);Other symptoms and signs involving the nervous system (N47.096)     Time: 2836-6294 PT Time Calculation (min) (ACUTE ONLY): 31 min  Charges:  $Therapeutic Exercise: 23-37 mins                     Keshon Markovitz A. Gilford Rile PT, DPT Acute Rehabilitation Services Pager [phone] Office [phone]    Linna Hoff 02/01/2021, 4:55 PM

## 2021-02-01 NOTE — Progress Notes (Signed)
   RN notified team of leg concerns. Noted to have cool foot with mottling. O2 sat 100% on toes. Recommended to check bedside doppler which revealed good pulses. Will continue to monitor for now.   Abigail Butts, PA-C 02/01/21; 6:56 PM

## 2021-02-01 NOTE — Plan of Care (Signed)

## 2021-02-02 LAB — GLUCOSE, CAPILLARY
Glucose-Capillary: 155 mg/dL — ABNORMAL HIGH (ref 70–99)
Glucose-Capillary: 125 mg/dL — ABNORMAL HIGH (ref 70–99)
Glucose-Capillary: 128 mg/dL — ABNORMAL HIGH (ref 70–99)
Glucose-Capillary: 143 mg/dL — ABNORMAL HIGH (ref 70–99)

## 2021-02-02 NOTE — TOC Progression Note (Signed)
Transition of Care [facility]) - Progression Note    Patient Details  Name: [name] MRN: [id number] Date of Birth: 1971-07-07  [facility]) CM/SW [place], RN Phone Number: 02/02/2021, 7:55 AM  Clinical Narrative:    Case management spoke with Maudie Mercury, CM of [facility] and they are offering the patient an available rehabilitation bed.  [facility] does not have an available bed at this time but should have a bed open in the next 1-2 days.  The patient will need to take PTAR for transport to the facility when the bed is ready.  I gave LOG document to Joella Prince, administrator and Nathaniel Man is aware.  CM will follow the patient for SNF placement once bed is available at the facility.   Expected Discharge Plan: [place] Barriers to Discharge: Inadequate or no insurance, Continued Medical Work up  Expected Discharge Plan and Services Expected Discharge Plan: [place] In-house Referral: Clinical Social Work, PCP / Psychologist, educational, Conservation officer, nature Services: Follow-up appt scheduled, Medication Assistance, [facility] Post Acute Care Choice: [place] Living arrangements for the past 2 months: Mobile Home                                       Social Determinants of Health (SDOH) Interventions    Readmission Risk Interventions Readmission Risk Prevention Plan 01/30/2021  Post Dischage Appt Complete  Medication Screening Complete  Transportation Screening Complete  Some recent data might be hidden

## 2021-02-02 NOTE — Progress Notes (Addendum)
[facility] CONSULTANT PROGRESS NOTE  [name] UTM:[id number] DOB: 07/12/1971 DOA: 01/19/2021 PCP: Coral Spikes, DO  Status: Remains inpatient appropriate because:Unsafe d/c plan  Dispo: The patient is from: Home              Anticipated d/c is to: SNF [facility] in the next 48 hours once bed is available              Patient currently is medically stable to d/c.   Difficult to place patient Yes  Level of care: Telemetry Medical  Code Status: Full Family Communication: Patient only DVT prophylaxis: Lovenox COVID vaccination status: Unknown   HPI: 50 y.o. male with medical history significant for known CAD s/p remote stents to RCA, presenting with syncope due to high-grade AV block in the setting of acute inferoposterior STEMI due to simultaneous thrombotic occlusion of old RCA stent and native left circumflex coronary artery, treated with emergency PCI and transient intra-aortic balloon pump support cardiac catheterization.  Subjective: Sitting up in bed without any specific complaints.  Aware he will likely discharge to SNF for rehab over the weekend.  Continues to report subtle improvement in grip strength and upper arm movement with adjustments in medications  Objective: Vitals:   02/02/21 0003 02/02/21 0416  BP: 108/68 103/73  Pulse: 74 73  Resp: 18 18  Temp: 97.9 F (36.6 C) 98.1 F (36.7 C)  SpO2: 100% 99%    Intake/Output Summary (Last 24 hours) at 02/02/2021 0756 Last data filed at 02/02/2021 0600 Gross per 24 hour  Intake --  Output 300 ml  Net -300 ml   Filed Weights   01/24/21 0444 01/27/21 0500 01/28/21 0409  Weight: 98.6 kg 102.3 kg 102.4 kg    Exam:  Constitutional: Calm and in no acute distress. Respiratory:.  Lungs remain clear.  Stable on room air.  Pulse oximetry within expected ranges. Cardiovascular: S1-S2, no JVD, regular pulse, trace bilateral lower extremity edema, normotensive Abdomen: LBM 6/16, soft nontender  nondistended with excellent appetite and oral intake Musculoskeletal: No obvious spasticity elicited during examination of RLE. Skin: surgical wound at right transmetatarsal amputation site with eschar and fibrin debris documented. (See picture below)  Neurologic: CN 2-12 grossly intact. bilateral hand grip has improved with right 3-4/5 and left 3/5.  General upper extremity strength is 3-4/5 with right greater than left.  Bilateral lower extremity strength is 4/5.  He continues to demonstrate significant difficulty transitioning from chair to standing position. Psychiatric: Alert and oriented x3 with pleasant affect.   Assessment/Plan: Acute problems: Inferior posterior STEMI with associated cardiogenic shock Remains asymptomatic without chest pain Continue DAPT with aspirin and Brilinta-FREE CARD WILL BE LEFT IN CHART SO PT CAN OBTAIN AFTER DC FROM SNF He also has been given the forms for drug assistance from the Brilinta manufacturer Continue Lipitor, carvedilol and losartan-all of these medications are available at [facility] for $4 for 30-day supply   Atrial fibrillation Currently maintaining sinus rhythm Cardiology documents atrial fibrillation was transient in the context of acute MI; no indication for anticoagulation   Transient third-degree AV block Due to MI and contributed to patient's syncopal presentation No indication for pacemaker   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  Valves are normal. Continue ARB and beta-blocker   Acute kidney injury/acute renal insufficiency Peak Creatinine 1.42 with slightly elevated BUN, creatinine was  0.9 on 6/9 Repeat electrolytes on 6/15 with a creatinine of 1.05 and a GFR of greater than 60.   Diabetes mellitus 2 with hyperglycemia on insulin HgbA1c 10.4 A1c in February 11.2 Lantus  and MC insulin dc'd in favor of preadmit OHAs Continue glyburide 10 mg daily and metformin 500 mg twice daily at discharge Continue SSI Liraglutide would be an excellent medication to use for CAD and and empagliflozin for heart failure but patient would be unlikely to afford these newer  drugs   Bilateral hand weakness/significant cervical stenosis with foraminal narrowing Appreciate assistance of the neurology team. Work-up this admission revealed significant C-spine stenosis with foraminal narrowing will to consider potential surgical intervention due to need for uninterrupted DAPT after recent MRI Neurology recommended beginning Lyrica and uptitrate as needed for pain management with a max dose of 600 mg/day  6/15 increased Lyrica to 100 mg 3 times daily Likely muscle spasms reflect spasticity from cervical stenosis- after dc to SNF the facility provider may need to further uptitrate Baclofen -this medication has already improved overall muscle spasticity and bilateral hand grip related to spasticity -rec OT and PT after dc Although he will need to have a repeat DOT physical to work in regards to recent hospitalization for syncope secondary to acute cardiac event it is suspected 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  He continues to demonstrate difficulty with mobility including moving from seated to standing position, he is unable to use his arms appropriately and has very diminished grip strength.  It is hopeful that these symptoms will improve after cervical stenosis surgery but the surgery cannot be achieved until after it is safe for him to come off of DAPT.  He has initiated Medicaid and disability applications.  PAD/recent left transmetatarsal amputation Slow to heal likely secondary to poorly controlled diabetes Primarily with eschar in the mid base some open area with red granular tissue and  maceration on the incision line posteriorly Patient has offloading boot given to him during previous admission. Appreciate WOC RN assistance  Continue Baclofen for muscle spasms on the right leg extending up into the back -Lyrica increased as above Likely muscle spasms reflect spasticity from cervical stenosis   Physical deconditioning Does not have 24/7 care available after discharge therefore not a candidate for CIR Plan is to pursue additional rehabilitative therapies at a skilled nursing facility  Dyslipidemia LDL elevated at 136 with normal HDL cholesterol Continue current statin        Data Reviewed: Basic Metabolic Panel: Recent Labs  Lab 01/31/21 0501  NA 136  K 3.7  CL 100  CO2 26  GLUCOSE 102*  BUN 16  CREATININE 1.05  CALCIUM 8.8*   Liver Function Tests: No results for input(s): AST, ALT, ALKPHOS, BILITOT, PROT, ALBUMIN in the last 168 hours. No results for input(s): LIPASE, AMYLASE in the last 168 hours. No results for input(s): AMMONIA in the last 168 hours. CBC: No results for input(s): WBC, NEUTROABS, HGB, HCT, MCV, PLT in the last 168 hours.  Cardiac Enzymes: No results for input(s): CKTOTAL, CKMB, CKMBINDEX, TROPONINI in the last 168 hours. BNP (last 3 results) No results for input(s): BNP in the last 8760 hours.  ProBNP (last 3 results) No results for input(s): PROBNP in the last 8760 hours.  CBG: Recent Labs  Lab 02/01/21 0847 02/01/21 1150 02/01/21 1605 02/01/21 2246 02/02/21 0633  GLUCAP 107* 84 115* 192* 155*  No results found for this or any previous visit (from the past 240 hour(s)).   Studies: No results found.  Scheduled Meds:  aspirin EC  81 mg Oral Daily   atorvastatin  80 mg Oral Daily   baclofen  5 mg Oral TID   carvedilol  6.25 mg Oral BID WC   Chlorhexidine Gluconate Cloth  6 each Topical Daily   enoxaparin (LOVENOX) injection  40 mg Subcutaneous Q24H   glipiZIDE  10 mg Oral QAC breakfast   insulin aspart  0-15  Units Subcutaneous TID WC   losartan  12.5 mg Oral Daily   melatonin  3 mg Oral QHS   metFORMIN  500 mg Oral BID WC   pregabalin  100 mg Oral TID   ticagrelor  90 mg Oral BID   Continuous Infusions:  Principal Problem:   STEMI involving right coronary artery (HCC) Active Problems:   Coronary artery disease involving native coronary artery of native heart without angina pectoris   Uncontrolled type 2 diabetes mellitus with circulatory disorder, without long-term current use of insulin (HCC)   Hyperlipidemia   Tobacco abuse   Cardiogenic shock ([place])   Weakness of both arms   Cervical stenosis of spine   Cervical myelopathy [facility])   Physical deconditioning   Consultants: Neurology  Procedures: Cardiac catheterization Echocardiogram  Antibiotics: Anti-infectives (From admission, onward)    None         Time spent: 25 minutes    Erin Hearing ANP  [facility] 7 am - 330 pm/M-F for direct patient care and secure chat Please refer to Amion for contact info 14  days

## 2021-02-02 NOTE — Progress Notes (Signed)
Progress Note  Patient Name: [name] Date of Encounter: 02/02/2021  [facility] Cardiologist: Larae Grooms, MD   Subjective   Sitting up in the chair. No complaints, hopeful for discharge to SNF soon.   Inpatient Medications    Scheduled Meds:  aspirin EC  81 mg Oral Daily   atorvastatin  80 mg Oral Daily   baclofen  5 mg Oral TID   carvedilol  6.25 mg Oral BID WC   Chlorhexidine Gluconate Cloth  6 each Topical Daily   enoxaparin (LOVENOX) injection  40 mg Subcutaneous Q24H   glipiZIDE  10 mg Oral QAC breakfast   insulin aspart  0-15 Units Subcutaneous TID WC   losartan  12.5 mg Oral Daily   melatonin  3 mg Oral QHS   metFORMIN  500 mg Oral BID WC   pregabalin  100 mg Oral TID   ticagrelor  90 mg Oral BID   Continuous Infusions:  PRN Meds: acetaminophen, diphenhydrAMINE, HYDROcodone-acetaminophen, nitroGLYCERIN, ondansetron (ZOFRAN) IV, polyethylene glycol, senna-docusate   Vital Signs    Vitals:   02/01/21 2007 02/02/21 0003 02/02/21 0416 02/02/21 0810  BP: 97/60 108/68 103/73 105/80  Pulse: 79 74 73 67  Resp: 17 18 18 18   Temp: 97.7 F (36.5 C) 97.9 F (36.6 C) 98.1 F (36.7 C) 97.7 F (36.5 C)  TempSrc: Oral Oral Oral Oral  SpO2: 99% 100% 99% 100%  Weight:        Intake/Output Summary (Last 24 hours) at 02/02/2021 1049 Last data filed at 02/02/2021 0600 Gross per 24 hour  Intake --  Output 300 ml  Net -300 ml   Last 3 Weights 01/28/2021 01/27/2021 01/24/2021  Weight (lbs) 225 lb 12 oz 225 lb 8.5 oz 217 lb 6 oz  Weight (kg) 102.4 kg 102.3 kg 98.6 kg      Telemetry    SR - Personally Reviewed  ECG    No new tracing.   Physical Exam   GEN: No acute distress.   Neck: No JVD Cardiac: RRR, no murmurs, rubs, or gallops.  Respiratory: Clear to auscultation bilaterally. GI: Soft, nontender, non-distended  MS: No edema; Right transmetatarsal amp Neuro:  Nonfocal  Psych: Normal affect   Labs    High Sensitivity Troponin:    Recent Labs  Lab 01/19/21 2014  TROPONINIHS 2,012*      Chemistry Recent Labs  Lab 01/31/21 0501  NA 136  K 3.7  CL 100  CO2 26  GLUCOSE 102*  BUN 16  CREATININE 1.05  CALCIUM 8.8*  GFRNONAA >60  ANIONGAP 10     HematologyNo results for input(s): WBC, RBC, HGB, HCT, MCV, MCH, MCHC, RDW, PLT in the last 168 hours.  BNPNo results for input(s): BNP, PROBNP in the last 168 hours.   DDimer No results for input(s): DDIMER in the last 168 hours.   Radiology    No results found.  Cardiac Studies   Echocardiogram: 01/20/2021    1. Left ventricular ejection fraction, by estimation, is 40%. The left  ventricle has moderately decreased function. The left ventricle  demonstrates regional wall motion abnormalities (see scoring  diagram/findings for description). Left ventricular  diastolic parameters are indeterminate. There is severe hypokinesis of the  left ventricular, entire inferior wall and inferolateral wall.   2. Right ventricular systolic function is mildly reduced. The right  ventricular size is normal. Tricuspid regurgitation signal is inadequate  for assessing PA pressure.   3. The mitral valve is normal in structure.  No evidence of mitral valve  regurgitation.   4. The aortic valve is tricuspid. Aortic valve regurgitation is not  visualized. Mild aortic valve sclerosis is present, with no evidence of  aortic valve stenosis.   5. The inferior vena cava is dilated in size with <50% respiratory  variability, suggesting right atrial pressure of 15 mmHg.     CATH: 01/19/2021   Mid RCA lesion is 100% stenosed- very late stent thrombosis. Aspiration thrombectomy was performed which restored TIMI-3 flow. Balloon angioplasty was performed using a BALLOON [place] [id number]. Post intervention, there is a 0% residual stenosis. Mid Cx lesion is 100% stenosed. A drug-eluting stent was successfully placed using a STENT RESOLUTE ONYX 2.5X15, postdilated to greater than 3  mm. Post intervention, there is a 0% residual stenosis. LV end diastolic pressure is mildly elevated. There is no aortic valve stenosis. Temporary pacemaker placement was used. Heart rate improved after PCI of the RCA. The temporary pacer was removed. Successful placement of the intra-aortic balloon pump.   Dual antiplatelet therapy for 12 months along with aggressive secondary prevention.  Very late stent thrombosis of the RCA likely due to his antiplatelet therapy being stopped back in February.  Second acute occlusion of the circumflex.     Transfer to [facility] given his intra-aortic balloon pump.   Diagnostic Dominance: Right      Intervention           Patient Profile     50 y.o. male with known CAD s/p remote stents to RCA, presenting with syncope due to high-grade AV block in the setting of acute inferoposterior STEMI due to simultaneous thrombotic occlusion of old RCA stent and native left circumflex coronary artery, treated with emergency PCI and transient intra-aortic balloon pump support cardiac catheterization.  Assessment & Plan   Inferior STEMI: Presented after 2 weeks of stopping clopidogrel.  Two-vessel thrombotic occlusion.  This appeared to be 2 acute vessel occlusions in the RCA and circumflex.  Cath showed late stent thrombosis of the RCA stent.  Underwent aspiration thrombectomy which was followed by balloon angioplasty with TIMI-3 flow restored.  PCI/DES x1 placed to mid left circumflex.  Placed on dual antiplatelet therapy with aspirin and Brilinta for at least 1 year. --Continue aspirin/Brilinta, Lipitor 80 mg daily, carvedilol 6.25 mg twice daily and losartan 12.5 mg daily  Cardiogenic shock: Initially required balloon pump, was able to wean and tolerated the addition of BB and ARB  Paroxysmal atrial fibrillation: Transient, suspect it was related to acute MI.  No recurrence during admission.  Given his unusual presentation, was suggested that he  discharged with 30-day monitor as an outpatient to assess for recurrent atrial fibrillation.  Third-degree AV block: Transient, ischemic mechanism.  Resolved and able to tolerate beta-blocker.  Ischemic cardiomyopathy: Echo on 6/4 noted EF of 40% with hypokinesis of the LV, entire inferior wall as well as lateral wall involvement.  -- Coreg 6.25 mg twice daily, losartan 12.5 mg daily  AKI: Creatinine peaked at 1.4.  Improved to within normal limits prior to discharge  Hyperlipidemia: LDL 136, noncompliance with statin prior to admission --Atorvastatin 80 mg daily --We will need fasting lipid panel and LFTs in 8 weeks  Bilateral hand weakness: Significant cervical spine stenosis and foraminal narrowing.  Evaluated by neurology during admission.  May eventually need to consider surgery but with recent MI and need for uninterrupted DAPT this will have to be delayed a minimum of 6 months. --Gabapentin was switched to Lyrica  Diabetes: Poorly controlled, globin A1c 10.4 --SSI  Dispo: Initially recommendation was for CIR but patient did not qualify due to financial issues.  Plan for SNF, pending bed placement.  He has multiple social stressors including lack of transportation, no medical coverage, and currently out of work.  Discussed with the [facility] Pharm.[facility] he does qualify to be followed in their clinic.  They will assist with financial and social issues at discharge.  For questions or updates, please contact [place] Please consult [email or web address] for contact info under   Patient would benefit from outpatient appointment with Dr. Courtney Heys, [place] neuro rehab physician.    Signed, Quay Burow, MD  02/02/2021, 10:49 AM

## 2021-02-03 LAB — GLUCOSE, CAPILLARY
Glucose-Capillary: 125 mg/dL — ABNORMAL HIGH (ref 70–99)
Glucose-Capillary: 104 mg/dL — ABNORMAL HIGH (ref 70–99)
Glucose-Capillary: 110 mg/dL — ABNORMAL HIGH (ref 70–99)
Glucose-Capillary: 138 mg/dL — ABNORMAL HIGH (ref 70–99)

## 2021-02-03 NOTE — Progress Notes (Signed)
Progress Note  Patient Name: [name] Date of Encounter: 02/03/2021  [facility] Cardiologist: Larae Grooms, MD   Subjective   No complaints  Inpatient Medications    Scheduled Meds:  aspirin EC  81 mg Oral Daily   atorvastatin  80 mg Oral Daily   baclofen  5 mg Oral TID   carvedilol  6.25 mg Oral BID WC   enoxaparin (LOVENOX) injection  40 mg Subcutaneous Q24H   glipiZIDE  10 mg Oral QAC breakfast   insulin aspart  0-15 Units Subcutaneous TID WC   losartan  12.5 mg Oral Daily   melatonin  3 mg Oral QHS   metFORMIN  500 mg Oral BID WC   pregabalin  100 mg Oral TID   ticagrelor  90 mg Oral BID   Continuous Infusions:  PRN Meds: acetaminophen, diphenhydrAMINE, HYDROcodone-acetaminophen, nitroGLYCERIN, ondansetron (ZOFRAN) IV, polyethylene glycol, senna-docusate   Vital Signs    Vitals:   02/02/21 2303 02/03/21 0343 02/03/21 0807 02/03/21 1241  BP: 104/66 103/67 110/80 93/63  Pulse: 76 71 72 72  Resp: 18 18 16 16   Temp: 97.7 F (36.5 C) 97.7 F (36.5 C) 97.9 F (36.6 C) 98.6 F (37 C)  TempSrc: Oral Oral Oral Oral  SpO2: 98% 98% 99% 100%  Weight:        Intake/Output Summary (Last 24 hours) at 02/03/2021 1318 Last data filed at 02/03/2021 0600 Gross per 24 hour  Intake --  Output 500 ml  Net -500 ml   Last 3 Weights 01/28/2021 01/27/2021 01/24/2021  Weight (lbs) 225 lb 12 oz 225 lb 8.5 oz 217 lb 6 oz  Weight (kg) 102.4 kg 102.3 kg 98.6 kg      Telemetry    N/a - Personally Reviewed  ECG    N/a - Personally Reviewed  Physical Exam   GEN: No acute distress.   Neck: No JVD Cardiac: RRR, no murmurs, rubs, or gallops.  Respiratory: Clear to auscultation bilaterally. GI: Soft, nontender, non-distended  MS: No edema; No deformity. Neuro:  Nonfocal  Psych: Normal affect   Labs    High Sensitivity Troponin:   Recent Labs  Lab 01/19/21 2014  TROPONINIHS 2,012*      Chemistry Recent Labs  Lab 01/31/21 0501  NA 136  K 3.7  CL  100  CO2 26  GLUCOSE 102*  BUN 16  CREATININE 1.05  CALCIUM 8.8*  GFRNONAA >60  ANIONGAP 10     HematologyNo results for input(s): WBC, RBC, HGB, HCT, MCV, MCH, MCHC, RDW, PLT in the last 168 hours.  BNPNo results for input(s): BNP, PROBNP in the last 168 hours.   DDimer No results for input(s): DDIMER in the last 168 hours.   Radiology    No results found.  Cardiac Studies     Patient Profile      50 y.o. male with known CAD s/p remote stents to RCA, presenting with syncope due to high-grade AV block in the setting of acute inferoposterior STEMI due to simultaneous thrombotic occlusion of old RCA stent and native left circumflex coronary artery, treated with emergency PCI and transient intra-aortic balloon pump support cardiac catheterization.  Assessment & Plan    1.CAD -remote history of RCA stents - presented with acute inferoposterir STEMI complicated by high grade AV block requiring transient temp pacer, cardiogenic shock initially with IABP  01/2021 cath: LAD mild diffuse disease, LCX 100%, mid RCA 100% heavily thrombotic/very late stent thrombosis. Had thrombectomy to RCA, PTCA. DES  to LCX.  - very late stent thrombosis of RCA, will need indefinitie DAPT - 01/2021 echo LVEF 40%, inferior and inferolateral hypokinesis.   - medical therapy with coreg 6.29m bid, losartan 12.5, atorva 80, asa 81, brillinta 933mbid.   2. Acute systolic HF -  6/6/5784cho LVEF 40%, inferior and inferolateral hypokinesis.  - on coreg 6.2537mid, losartan 12.5mg40moft bp's, would not tolerate entresto, perhaps low dose aldactone in the near future but would hold for now given SBP in the 90s. - could consider farxiga in the future once stablized on initial cardiac meds, also no medical coverage and medications in general may be a challenge.   3. Transient afib - in setting of STEMI, has not been committed to anticoag. No clear recurrence.  - can consider outpatient monitor at f/u  4.  Hyperlipidemia - noncompliant with statin at home, currently on atorva 80mg59mly.   5. Bilateral hand weakness: Significant cervical spine stenosis and foraminal narrowing.  Evaluated by neurology during admission.  May eventually need to consider surgery but with recent MI and need for uninterrupted DAPT this will have to be delayed a minimum of 6 months. Patient would benefit from outpatient appointment with [facility].  6. Dispo - awaiting SNF placement, from [facility] next 48 hrs when bed available.  - medically stable for discharge  For questions or updates, please contact [facility] consult [email or web address] for contact info under        Signed, BrancCarlyle Dolly 02/03/2021, 1:18 PM

## 2021-02-03 NOTE — Progress Notes (Signed)
Occupational Therapy Treatment Patient Details Name: [name] MRN: [id number] DOB: [birth year] Today's Date: 02/03/2021    History of present illness 50 yo admitted 6/4 with STEMI with in stent thrombosis with Afib with RVR needing IABP. 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 CT: C5-6: Moderate to large broad-based disc protrusion asymmetric left  with mass effect on the ventral thecal sac and obliteration of the  ventral CSF space. PMhx: HTN, HLD, NASH cirrhosis, DM, neuropathy, cellulitis, Rt transmet that has dressing on it.   OT comments  Patient seen for self feeding this date.  Patient stating he is having difficulty cutting his food.  Foam issued for knife, and patient practiced cutting theraputty.  Patient stating he generally orders finger foods for lunch, but foam vs rocker knife for when needed.  SNF continues to be recommended due to 24 hour needs.  OT to continue in the acute setting.    Follow Up Recommendations  SNF;Supervision/Assistance - 24 hour    Equipment Recommendations       Recommendations for Other Services      Precautions / Restrictions Precautions Precautions: Fall Restrictions Weight Bearing Restrictions: No              ADL either performed or assessed with clinical judgement   ADL   Eating/Feeding: Minimal assistance;With adaptive utensils;Sitting Eating/Feeding Details (indicate cue type and reason): issued red foam, with blue over to increase surface area for knife to increase cutting ability and independence   Grooming Details (indicate cue type and reason): red foam for tooth brush                                                                                                                        Pertinent Vitals/ Pain       Pain Assessment: No/denies pain Pain Intervention(s): Monitored during session                                                           Frequency  Min 2X/week        Progress Toward Goals  OT Goals(current goals can now be found in the care plan section)  Progress towards OT goals: Progressing toward goals  Acute Rehab OT Goals Patient Stated Goal: be able to walk OT Goal Formulation: With patient Time For Goal Achievement: 02/05/21 Potential to Achieve Goals: [place] Discharge plan remains appropriate    Co-evaluation                 AM-PAC OT "6 Clicks" Daily Activity     Outcome Measure   Help from another person eating meals?: A Little Help from another person taking care of personal grooming?: A Little Help from another person  toileting, which includes using toliet, bedpan, or urinal?: A Lot Help from another person bathing (including washing, rinsing, drying)?: A Lot Help from another person to put on and taking off regular upper body clothing?: A Lot Help from another person to put on and taking off regular lower body clothing?: Total 6 Click Score: 13    End of Session    OT Visit Diagnosis: Other abnormalities of gait and mobility (R26.89);Unsteadiness on feet (R26.81);Muscle weakness (generalized) (M62.81);Pain;Other (comment) Pain - Right/Left: Right   Activity Tolerance Patient tolerated treatment well   Patient Left in chair;with call bell/phone within reach;with chair alarm set   Nurse Communication          Time: 3953-2023 OT Time Calculation (min): 17 min  Charges: OT General Charges $OT Visit: 1 Visit OT Treatments $Self Care/Home Management : 8-22 mins  02/03/2021  Rich, OTR/L  Acute Rehabilitation Services  Office:  [place] 02/03/2021, 11:50 AM

## 2021-02-03 NOTE — Progress Notes (Signed)
Progress Note    [name]  ZES:[id number] DOB: 03/05/1971  DOA: 01/19/2021 PCP: Coral Spikes, DO      Brief Narrative:    Medical records reviewed and are as summarized below:  [name] is a 50 y.o. male with medical history significant for known CAD s/p remote stents to RCA, presenting with syncope due to high-grade AV block in the setting of acute inferoposterior STEMI due to simultaneous thrombotic occlusion of old RCA stent and native left circumflex coronary artery, treated with emergency PCI and transient intra-aortic balloon pump support cardiac catheterization.        Assessment/Plan:   Principal Problem:   STEMI involving right coronary artery (HCC) Active Problems:   Coronary artery disease involving native coronary artery of native heart without angina pectoris   Uncontrolled type 2 diabetes mellitus with circulatory disorder, without long-term current use of insulin (HCC)   Hyperlipidemia   Tobacco abuse   Cardiogenic shock (HCC)   Weakness of both arms   Cervical stenosis of spine   Cervical myelopathy (HCC)   Physical deconditioning    Body mass index is 28.98 kg/m.    Inferior posterior STEMI with associated cardiogenic shock Remains asymptomatic without chest pain Continue DAPT with aspirin and Brilinta-FREE CARD WILL BE LEFT IN CHART SO PT CAN OBTAIN AFTER DC FROM SNF He also has been given the forms for drug assistance from the Brilinta manufacturer Continue Lipitor, carvedilol and losartan-all of these medications are available at [facility] for $4 for 30-day supply   Atrial fibrillation By cardiology, this was transient in the context of acute MI and has no indication for anticoagulation.   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  Valves are normal. Continue ARB and beta-blocker   Acute kidney injury/acute renal insufficiency Improved  Diabetes mellitus 2 with hyperglycemia on insulin HgbA1c 10.4 A1c in February 11.2 Lantus and MC insulin dc'd in favor of preadmit OHAs Continue glyburide 10 mg daily and metformin 500 mg twice daily at discharge Continue SSI Liraglutide would be an excellent medication to use for CAD and and empagliflozin for heart failure but patient would be unlikely to afford these newer  drugs   Bilateral hand weakness/significant cervical stenosis with foraminal narrowing Appreciate assistance of the neurology team. Work-up this admission revealed significant C-spine stenosis with foraminal narrowing will to consider potential surgical intervention due to need for uninterrupted DAPT after recent MRI Neurology recommended beginning Lyrica and uptitrate as needed for pain management with a max dose of 600 mg/day 6/15 increased Lyrica to 100 mg 3 times daily Likely muscle spasms reflect spasticity from cervical stenosis- after dc to SNF the facility provider may need to further uptitrate Baclofen -this medication has already improved overall muscle spasticity and bilateral hand grip related to spasticity -rec OT and PT after dc   PAD/recent left transmetatarsal amputation No acute issues.  Continue baclofen  Physical deconditioning Does not have 24/7 care available after discharge therefore not a candidate for CIR Plan is to pursue additional rehabilitative therapies at a skilled nursing facility   Dyslipidemia LDL elevated at 136 with normal HDL cholesterol Continue Lipitor            Diet Order  Diet Carb Modified Fluid consistency: Thin; Room service appropriate? Yes with Assist  Diet effective now                       Consultants: Neurology Cardiology  Procedures: Cardiac catheterization    Medications:    aspirin EC  81 mg Oral Daily   atorvastatin  80 mg Oral Daily   baclofen  5 mg Oral TID   carvedilol  6.25 mg Oral BID WC   enoxaparin (LOVENOX) injection  40 mg Subcutaneous Q24H   glipiZIDE  10 mg Oral QAC breakfast   insulin aspart  0-15 Units Subcutaneous TID WC   losartan  12.5 mg Oral Daily   melatonin  3 mg Oral QHS   metFORMIN  500 mg Oral BID WC   pregabalin  100 mg Oral TID   ticagrelor  90 mg Oral BID   Continuous Infusions:   Anti-infectives (From admission, onward)    None              Family Communication/Anticipated D/C date and plan/Code Status   DVT prophylaxis: enoxaparin (LOVENOX) injection 40 mg Start: 01/22/21 1000     Code Status: Full Code  Family Communication: None Disposition Plan:    Status is: Inpatient  Remains inpatient appropriate because:Inpatient level of care appropriate due to severity of illness  Dispo: The patient is from: Home              Anticipated d/c is to: Home              Patient currently is not medically stable to d/c.   Difficult to place patient No           Subjective:   Interval events noted.  No shortness of breath or chest pain.  Objective:    Vitals:   02/02/21 2303 02/03/21 0343 02/03/21 0807 02/03/21 1241  BP: 104/66 103/67 110/80 93/63  Pulse: 76 71 72 72  Resp: 18 18 16 16   Temp: 97.7 F (36.5 C) 97.7 F (36.5 C) 97.9 F (36.6 C) 98.6 F (37 C)  TempSrc: Oral Oral Oral Oral  SpO2: 98% 98% 99% 100%  Weight:       No data found.   Intake/Output Summary (Last 24 hours) at 02/03/2021 1542 Last data filed at 02/03/2021 0600 Gross per 24 hour  Intake --  Output 500 ml  Net -500 ml   Filed Weights   01/24/21 0444 01/27/21 0500 01/28/21 0409  Weight: 98.6 kg 102.3 kg 102.4 kg    Exam:  GEN: NAD SKIN: Warm and dry EYES: EOMI, no pallor or icterus ENT: MMM CV:  RRR PULM: CTA B ABD: soft, ND, NT, +BS CNS: AAO x 3, mild bilateral upper extremity weakness (power is 4/5 in both upper extremities) EXT: No edema or tenderness.  Right transmetatarsal amputee.        Data Reviewed:   I have personally reviewed following labs and imaging studies:  Labs: Labs show the following:   Basic Metabolic Panel: Recent Labs  Lab 01/31/21 0501  NA 136  K 3.7  CL 100  CO2 26  GLUCOSE 102*  BUN 16  CREATININE 1.05  CALCIUM 8.8*   GFR Estimated Creatinine Clearance: 107.5 mL/min (by C-G formula based on SCr of 1.05 mg/dL). Liver Function Tests: No results for input(s): AST, ALT, ALKPHOS, BILITOT, PROT, ALBUMIN in the last 168 hours. No results for input(s): LIPASE, AMYLASE in the last 168 hours. No  results for input(s): AMMONIA in the last 168 hours. Coagulation profile No results for input(s): INR, PROTIME in the last 168 hours.  CBC: No results for input(s): WBC, NEUTROABS, HGB, HCT, MCV, PLT in the last 168 hours. Cardiac Enzymes: No results for input(s): CKTOTAL, CKMB, CKMBINDEX, TROPONINI in the last 168 hours. BNP (last 3 results) No results for input(s): PROBNP in the last 8760 hours. CBG: Recent Labs  Lab 02/02/21 1126 02/02/21 1617 02/02/21 2128 02/03/21 0616 02/03/21 1147  GLUCAP 125* 128* 143* 125* 104*   D-Dimer: No results for input(s): DDIMER in the last 72 hours. Hgb A1c: No results for input(s): HGBA1C in the last 72 hours. Lipid Profile: No results for input(s): CHOL, HDL, LDLCALC, TRIG, CHOLHDL, LDLDIRECT in the last 72 hours. Thyroid function studies: No results for input(s): TSH, T4TOTAL, T3FREE, THYROIDAB in the last 72 hours.  Invalid input(s): FREET3 Anemia work up: No results for input(s): VITAMINB12, FOLATE, FERRITIN, TIBC, IRON, RETICCTPCT in the last 72 hours. Sepsis Labs: No results for input(s): PROCALCITON, WBC, LATICACIDVEN in the last 168 hours.  Microbiology No results found for this or any  previous visit (from the past 240 hour(s)).  Procedures and diagnostic studies:  No results found.             LOS: 15 days   [place] Copywriter, advertising on [email or web address]. If 7PM-7AM, please contact night-coverage at [email or web address]     02/03/2021, 3:42 PM

## 2021-02-04 LAB — GLUCOSE, CAPILLARY
Glucose-Capillary: 137 mg/dL — ABNORMAL HIGH (ref 70–99)
Glucose-Capillary: 131 mg/dL — ABNORMAL HIGH (ref 70–99)
Glucose-Capillary: 91 mg/dL (ref 70–99)

## 2021-02-04 NOTE — Progress Notes (Addendum)
PROGRESS NOTE    [name]  BJY:[id number] DOB: [birth year] DOA: 01/19/2021 PCP: Coral Spikes, DO     Brief Narrative:  Patient is a 50 year old male with a medical history of CAD status post remote stents to the RCA who presented with syncope due to high-grade AV block in the setting of an acute inferior posterior STEMI.  This was caused by a thrombotic occlusion of the old RCA stent and native left circumflex coronary artery and he was treated with emergency PCI and balloon pump support catheterization.  The internal medicine team was consulted for diabetes control.  Well-controlled on sliding scale insulin and metformin 500 mg twice daily in addition to glipizide 10 mg daily.  New events last 24 hours / Subjective: Patient reports feeling well overall.  He is awaiting skilled nursing facility placement.  His sugars have been running in the low 100s.  No hypoglycemic episodes.  He does not tolerate metformin well over long periods of time.  He has GI side effects.  Glipizide appears to control his sugars relatively well but he has not taken it routinely due to having no insurance coverage and trying to stretch out the medication.  Assessment & Plan:   Principal Problem:   STEMI involving right coronary artery [facility]) Active Problems:   Coronary artery disease involving native coronary artery of native heart without angina pectoris   Uncontrolled type 2 diabetes mellitus with circulatory disorder, without long-term current use of insulin (HCC)   Hyperlipidemia   Tobacco abuse   Cardiogenic shock (HCC)   Weakness of both arms   Cervical stenosis of spine   Cervical myelopathy (HCC)   Physical deconditioning  Uncontrolled type 2 diabetes with circulatory disorder without long-term use of insulin  Metformin 500 mg twice daily, this is free at [facility] pharmacy  Glipizide 10 mg daily.  An alternative would be glipizide 10 mg twice daily to replace metformin if he does not tolerate the  latter.  Continue to monitor glucose levels  Stable for discharge from IM standpoint once SNF approved  Primary team managing principal problem   DVT prophylaxis: Lovenox Code Status: Full Family Communication: Self Coming From: Home Disposition Plan: SNF Barriers to Discharge: Awaiting SNF placement   Objective: Vitals:   02/03/21 1932 02/03/21 2323 02/04/21 0333 02/04/21 0830  BP: 94/62 104/62 104/72 106/70  Pulse: 72 73 71 70  Resp: 16 14 18 16   Temp: 98.3 F (36.8 C) (!) 97.5 F (36.4 C) 97.6 F (36.4 C) 97.8 F (36.6 C)  TempSrc: Oral Oral Oral Oral  SpO2: 100% 100% 99% 99%  Weight:        Intake/Output Summary (Last 24 hours) at 02/04/2021 1138 Last data filed at 02/04/2021 0830 Gross per 24 hour  Intake 1380 ml  Output 2300 ml  Net -920 ml   Filed Weights   01/24/21 0444 01/27/21 0500 01/28/21 0409  Weight: 98.6 kg 102.3 kg 102.4 kg    Examination:  General exam: Appears calm and comfortable  Respiratory system: Clear to auscultation. Respiratory effort normal. No respiratory distress. No conversational dyspnea.  Cardiovascular system: S1 & S2 heard, RRR. No murmurs. No pedal edema. Extremities: Symmetric in appearance  Skin: No rashes, lesions or ulcers on exposed skin  Psychiatry: Judgement and insight appear normal. Mood & affect appropriate.   Data Reviewed: I have personally reviewed following labs and imaging studies  Basic Metabolic Panel: Recent Labs  Lab 01/31/21 0501  NA 136  K 3.7  CL  100  CO2 26  GLUCOSE 102*  BUN 16  CREATININE 1.05  CALCIUM 8.8*   CBG: Recent Labs  Lab 02/03/21 0616 02/03/21 1147 02/03/21 1619 02/03/21 2124 02/04/21 0620  GLUCAP 125* 104* 110* 138* 137*     Scheduled Meds:  aspirin EC  81 mg Oral Daily   atorvastatin  80 mg Oral Daily   baclofen  5 mg Oral TID   carvedilol  6.25 mg Oral BID WC   enoxaparin (LOVENOX) injection  40 mg Subcutaneous Q24H   glipiZIDE  10 mg Oral QAC breakfast   insulin  aspart  0-15 Units Subcutaneous TID WC   losartan  12.5 mg Oral Daily   melatonin  3 mg Oral QHS   metFORMIN  500 mg Oral BID WC   pregabalin  100 mg Oral TID   ticagrelor  90 mg Oral BID   Continuous Infusions:   LOS: 16 days    Time spent: 20 minutes   Shelda Pal, DO [facility] 02/04/2021, 11:38 AM   Available via Epic secure chat 7am-7pm After these hours, please refer to coverage provider listed on [email or web address]

## 2021-02-04 NOTE — Progress Notes (Signed)
Dressing change completed as per MD orders.

## 2021-02-04 NOTE — Progress Notes (Signed)
Progress Note  Patient Name: [name] Date of Encounter: 02/04/2021  [facility] Cardiologist: Larae Grooms, MD   Subjective   No complaints  Inpatient Medications    Scheduled Meds:  aspirin EC  81 mg Oral Daily   atorvastatin  80 mg Oral Daily   baclofen  5 mg Oral TID   carvedilol  6.25 mg Oral BID WC   enoxaparin (LOVENOX) injection  40 mg Subcutaneous Q24H   glipiZIDE  10 mg Oral QAC breakfast   insulin aspart  0-15 Units Subcutaneous TID WC   losartan  12.5 mg Oral Daily   melatonin  3 mg Oral QHS   metFORMIN  500 mg Oral BID WC   pregabalin  100 mg Oral TID   ticagrelor  90 mg Oral BID   Continuous Infusions:  PRN Meds: acetaminophen, diphenhydrAMINE, HYDROcodone-acetaminophen, nitroGLYCERIN, ondansetron (ZOFRAN) IV, polyethylene glycol, senna-docusate   Vital Signs    Vitals:   02/03/21 2323 02/04/21 0333 02/04/21 0830 02/04/21 1201  BP: 104/62 104/72 106/70 95/64  Pulse: 73 71 70 69  Resp: 14 18 16 16   Temp: (!) 97.5 F (36.4 C) 97.6 F (36.4 C) 97.8 F (36.6 C) (!) 97.3 F (36.3 C)  TempSrc: Oral Oral Oral Oral  SpO2: 100% 99% 99% 100%  Weight:        Intake/Output Summary (Last 24 hours) at 02/04/2021 1215 Last data filed at 02/04/2021 0830 Gross per 24 hour  Intake 1080 ml  Output 2300 ml  Net -1220 ml   Last 3 Weights 01/28/2021 01/27/2021 01/24/2021  Weight (lbs) 225 lb 12 oz 225 lb 8.5 oz 217 lb 6 oz  Weight (kg) 102.4 kg 102.3 kg 98.6 kg      Telemetry    N/a- Personally Reviewed  ECG    N/a - Personally Reviewed  Physical Exam   GEN: No acute distress.   Neck: No JVD Cardiac: RRR, no murmurs, rubs, or gallops.  Respiratory: Clear to auscultation bilaterally. GI: Soft, nontender, non-distended  MS: No edema; No deformity. Neuro:  Nonfocal  Psych: Normal affect   Labs    High Sensitivity Troponin:   Recent Labs  Lab 01/19/21 2014  TROPONINIHS 2,012*      Chemistry Recent Labs  Lab 01/31/21 0501  NA  136  K 3.7  CL 100  CO2 26  GLUCOSE 102*  BUN 16  CREATININE 1.05  CALCIUM 8.8*  GFRNONAA >60  ANIONGAP 10     HematologyNo results for input(s): WBC, RBC, HGB, HCT, MCV, MCH, MCHC, RDW, PLT in the last 168 hours.  BNPNo results for input(s): BNP, PROBNP in the last 168 hours.   DDimer No results for input(s): DDIMER in the last 168 hours.   Radiology    No results found.  Cardiac Studies    Patient Profile     50 y.o. male with known CAD s/p remote stents to RCA, presenting with syncope due to high-grade AV block in the setting of acute inferoposterior STEMI due to simultaneous thrombotic occlusion of old RCA stent and native left circumflex coronary artery, treated with emergency PCI and transient intra-aortic balloon pump support cardiac catheterization.  Assessment & Plan    1.CAD -remote history of RCA stents - presented with acute inferoposterir STEMI complicated by high grade AV block requiring transient temp pacer, cardiogenic shock initially with IABP   01/2021 cath: LAD mild diffuse disease, LCX 100%, mid RCA 100% heavily thrombotic/very late stent thrombosis. Had thrombectomy to RCA, PTCA.  DES to LCX. - very late stent thrombosis of RCA, will need indefinitie DAPT - 01/2021 echo LVEF 40%, inferior and inferolateral hypokinesis.   - medical therapy with coreg 6.44m bid, losartan 12.5, atorva 80, asa 81, brillinta 915mbid.   2. Acute systolic HF -  6/6/1950cho LVEF 40%, inferior and inferolateral hypokinesis. - on coreg 6.2546mid, losartan 12.5mg60moft bp's, would not tolerate entresto, perhaps low dose aldactone in the near future but would hold for now given SBP in the 90s. - could consider farxiga in the future once stablized on initial cardiac meds, also no medical coverage and medications in general may be a challenge.   3. Transient afib - in setting of STEMI, has not been committed to anticoag. No clear recurrence. - can consider outpatient monitor at  f/u   4. Hyperlipidemia - noncompliant with statin at home, currently on atorva 80mg62mly.   5. Bilateral hand weakness: Significant cervical spine stenosis and foraminal narrowing.  Evaluated by neurology during admission.  May eventually need to consider surgery but with recent MI and need for uninterrupted DAPT this will have to be delayed a minimum of 6 months. Patient would benefit from outpatient appointment with [facility]. [facility] physician.   6. Dispo - awaiting SNF placement, [facility] available. - medically stable for discharge   No changes today, awaiting SNF placement.   For questions or updates, please contact [facility] consult [email or web address] for contact info under        Signed, BrancCarlyle Dolly 02/04/2021, 12:15 PM

## 2021-02-04 NOTE — Plan of Care (Signed)
Progressing, will continue to monitor.

## 2021-02-05 ENCOUNTER — Other Ambulatory Visit: Payer: Self-pay | Admitting: Cardiology

## 2021-02-05 ENCOUNTER — Encounter: Payer: Self-pay | Admitting: *Deleted

## 2021-02-05 ENCOUNTER — Other Ambulatory Visit (HOSPITAL_COMMUNITY): Payer: Self-pay

## 2021-02-05 DIAGNOSIS — R002 Palpitations: Secondary | ICD-10-CM

## 2021-02-05 LAB — GLUCOSE, CAPILLARY
Glucose-Capillary: 101 mg/dL — ABNORMAL HIGH (ref 70–99)
Glucose-Capillary: 115 mg/dL — ABNORMAL HIGH (ref 70–99)
Glucose-Capillary: 108 mg/dL — ABNORMAL HIGH (ref 70–99)

## 2021-02-05 LAB — RESP PANEL BY RT-PCR (FLU A&B, COVID) ARPGX2
Influenza A by PCR: NEGATIVE
Influenza B by PCR: NEGATIVE
SARS Coronavirus 2 by RT PCR: NEGATIVE

## 2021-02-05 MED ORDER — DAPAGLIFLOZIN PROPANEDIOL 10 MG PO TABS: 10.0000 mg | ORAL_TABLET | Freq: Every day | ORAL | Status: DC

## 2021-02-05 MED ORDER — DAPAGLIFLOZIN PROPANEDIOL 10 MG PO TABS
10.0000 mg | ORAL_TABLET | Freq: Every day | ORAL | Status: DC
  Administered 2021-02-05: 10 mg via ORAL
  Filled 2021-02-05 (×2): qty 1

## 2021-02-05 NOTE — Progress Notes (Addendum)
Progress Note  Patient Name: [name] Date of Encounter: 02/05/2021  [facility] Cardiologist: Larae Grooms, MD   Subjective   No complaints. Awaiting placement.   Inpatient Medications    Scheduled Meds:  aspirin EC  81 mg Oral Daily   atorvastatin  80 mg Oral Daily   baclofen  5 mg Oral TID   carvedilol  6.25 mg Oral BID WC   enoxaparin (LOVENOX) injection  40 mg Subcutaneous Q24H   glipiZIDE  10 mg Oral QAC breakfast   insulin aspart  0-15 Units Subcutaneous TID WC   losartan  12.5 mg Oral Daily   melatonin  3 mg Oral QHS   metFORMIN  500 mg Oral BID WC   pregabalin  100 mg Oral TID   ticagrelor  90 mg Oral BID   Continuous Infusions:  PRN Meds: acetaminophen, diphenhydrAMINE, HYDROcodone-acetaminophen, nitroGLYCERIN, ondansetron (ZOFRAN) IV, polyethylene glycol, senna-docusate   Vital Signs    Vitals:   02/04/21 1949 02/05/21 0005 02/05/21 0330 02/05/21 0900  BP: (!) 92/59 (!) 86/60 113/76 105/74  Pulse: 77 73 70 73  Resp: 18 18 18 18   Temp: 97.6 F (36.4 C) 98 F (36.7 C) (!) 97.5 F (36.4 C) 98 F (36.7 C)  TempSrc: Oral Oral Oral Oral  SpO2: 100% 99% 100% 98%  Weight:        Intake/Output Summary (Last 24 hours) at 02/05/2021 1116 Last data filed at 02/05/2021 6712 Gross per 24 hour  Intake --  Output 1250 ml  Net -1250 ml   Last 3 Weights 01/28/2021 01/27/2021 01/24/2021  Weight (lbs) 225 lb 12 oz 225 lb 8.5 oz 217 lb 6 oz  Weight (kg) 102.4 kg 102.3 kg 98.6 kg      Telemetry    N/a   ECG    No new tracing  Physical Exam   GEN: No acute distress.   Neck: No JVD Cardiac: RRR, no murmurs, rubs, or gallops.  Respiratory: Clear to auscultation bilaterally. GI: Soft, nontender, non-distended  MS: No edema Neuro:  Nonfocal  Psych: Normal affect   Labs    High Sensitivity Troponin:   Recent Labs  Lab 01/19/21 2014  TROPONINIHS 2,012*      Chemistry Recent Labs  Lab 01/31/21 0501  NA 136  K 3.7  CL 100  CO2 26   GLUCOSE 102*  BUN 16  CREATININE 1.05  CALCIUM 8.8*  GFRNONAA >60  ANIONGAP 10     HematologyNo results for input(s): WBC, RBC, HGB, HCT, MCV, MCH, MCHC, RDW, PLT in the last 168 hours.  BNPNo results for input(s): BNP, PROBNP in the last 168 hours.   DDimer No results for input(s): DDIMER in the last 168 hours.   Radiology    No results found.  Cardiac Studies   Echocardiogram: 01/20/2021    1. Left ventricular ejection fraction, by estimation, is 40%. The left  ventricle has moderately decreased function. The left ventricle  demonstrates regional wall motion abnormalities (see scoring  diagram/findings for description). Left ventricular  diastolic parameters are indeterminate. There is severe hypokinesis of the  left ventricular, entire inferior wall and inferolateral wall.   2. Right ventricular systolic function is mildly reduced. The right  ventricular size is normal. Tricuspid regurgitation signal is inadequate  for assessing PA pressure.   3. The mitral valve is normal in structure. No evidence of mitral valve  regurgitation.   4. The aortic valve is tricuspid. Aortic valve regurgitation is not  visualized.  Mild aortic valve sclerosis is present, with no evidence of  aortic valve stenosis.   5. The inferior vena cava is dilated in size with <50% respiratory  variability, suggesting right atrial pressure of 15 mmHg.     CATH: 01/19/2021   Mid RCA lesion is 100% stenosed- very late stent thrombosis. Aspiration thrombectomy was performed which restored TIMI-3 flow. Balloon angioplasty was performed using a BALLOON [place] [id number]. Post intervention, there is a 0% residual stenosis. Mid Cx lesion is 100% stenosed. A drug-eluting stent was successfully placed using a STENT RESOLUTE ONYX 2.5X15, postdilated to greater than 3 mm. Post intervention, there is a 0% residual stenosis. LV end diastolic pressure is mildly elevated. There is no aortic valve  stenosis. Temporary pacemaker placement was used. Heart rate improved after PCI of the RCA. The temporary pacer was removed. Successful placement of the intra-aortic balloon pump.   Dual antiplatelet therapy for 12 months along with aggressive secondary prevention.  Very late stent thrombosis of the RCA likely due to his antiplatelet therapy being stopped back in February.  Second acute occlusion of the circumflex.     Transfer to [facility] given his intra-aortic balloon pump.   Diagnostic Dominance: Right      Intervention         Patient Profile     50 y.o. male with known CAD s/p remote stents to RCA, presenting with syncope due to high-grade AV block in the setting of acute inferoposterior STEMI due to simultaneous thrombotic occlusion of old RCA stent and native left circumflex coronary artery, treated with emergency PCI and transient intra-aortic balloon pump support cardiac catheterization.  Assessment & Plan    Inferior STEMI: Presented after 2 weeks of stopping clopidogrel.  Two-vessel thrombotic occlusion.  This appeared to be 2 acute vessel occlusions in the RCA and circumflex.  Cath showed late stent thrombosis of the RCA stent.  Underwent aspiration thrombectomy which was followed by balloon angioplasty with TIMI-3 flow restored.  PCI/DES x1 placed to mid left circumflex.  Placed on dual antiplatelet therapy with aspirin and Brilinta for at least 1 year. --Continue aspirin/Brilinta, Lipitor 80 mg daily, carvedilol 6.25 mg twice daily and losartan 12.5 mg daily.   Cardiogenic shock: Initially required balloon pump, was able to wean and tolerated the addition of BB and ARB   Paroxysmal atrial fibrillation: Transient, suspect it was related to acute MI.  No recurrence during admission.  Given his unusual presentation, was suggested that he discharged with 30-day monitor as an outpatient to assess for recurrent atrial fibrillation.   Third-degree AV block: Transient,  ischemic mechanism.  Resolved and able to tolerate beta-blocker.   Ischemic cardiomyopathy: Echo on 6/4 noted EF of 40% with hypokinesis of the LV, entire inferior wall as well as lateral wall involvement. -- Coreg 6.25 mg twice daily, losartan 12.5 mg daily. Blood pressures to soft for transition to [facility] prior to discharge.   AKI: Creatinine peaked at 1.4.  Improved to within normal limits prior to discharge   Hyperlipidemia: LDL 136, noncompliance with statin prior to admission --Atorvastatin 80 mg daily --Will need fasting lipid panel and LFTs in 8 weeks   Bilateral hand weakness: Significant cervical spine stenosis and foraminal narrowing.  Evaluated by neurology during admission.  May eventually need to consider surgery but with recent MI and need for uninterrupted DAPT this will have to be delayed a minimum of 6 months. --Gabapentin was switched to Lyrica --would benefit from outpatient appt with Dr.  Megan Lovorn with [facility] neuro rehab    Diabetes: Poorly controlled, globin A1c 10.4 --SSI while inpatient --Will add farxiga today  HF PharmD assistance with medication assistance forms. Medically stable, pending SNF availability     For questions or updates, please contact [place] Please consult [email or web address] for contact info under        Signed, Reino Bellis, NP  02/05/2021, 11:16 AM     Patient seen and examined. Agree with assessment and plan. No recurrent chest pain. He was just told of bed placement later today in [facility]. Continue with uninterrupted DAPT for a minimum of one year and probably longer. F/U with Dr. Irish Lack.   Troy Sine, MD, [facility] 02/05/2021 2:18 PM

## 2021-02-05 NOTE — Progress Notes (Addendum)
Discharged to facility by [place].  IV access removed.  Transported per stretcher. Built up utensil adapters sent with along with all belongings, including his shoes, clothes, cell phone, charger cord.  He did have a pair of shoes that he said to throw away  because his cousin brought them to him and they were old and not his and did not fit.  He does have a new pair of shoes with him that do fit and he did take with him.

## 2021-02-05 NOTE — Progress Notes (Signed)
Physical Therapy Treatment Patient Details Name: [name] MRN: [id number] DOB: 1970-10-16 Today's Date: 02/05/2021    History of Present Illness 50 yo admitted 6/4 with STEMI with in stent thrombosis with Afib with RVR needing IABP. 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 CT: C5-6: Moderate to large broad-based disc protrusion asymmetric left  with mass effect on the ventral thecal sac and obliteration of the  ventral CSF space. PMhx: HTN, HLD, NASH cirrhosis, DM, neuropathy, cellulitis, Rt transmet that has dressing on it.    PT Comments    Today's skilled session continued to address strengthening and mobility progression with one person assist only today. The pt is making steady progress. Acute PT to continue during pt's hospital stay.  Follow Up Recommendations  SNF     Equipment Recommendations  Wheelchair cushion (measurements PT);Wheelchair (measurements PT);Hospital bed    Recommendations for Other Services Rehab consult     Precautions / Restrictions Precautions Precautions: Fall Precaution Comments: bil UE and LE weakness Restrictions RLE Weight Bearing: Weight bearing as tolerated    Mobility  Bed Mobility Overal bed mobility: Needs Assistance   Rolling: Max assist Sidelying to sit: Mod assist;HOB elevated       General bed mobility comments: with HOB 35 degrees- pt able to assit with moving LE's toward and off EOB. cues/faciliation with use of UE's to elevate trunk into sitting at edge of bed.    Transfers Overall transfer level: Needs assistance Equipment used: Ambulation equipment used Transfers: Sit to/from [facility] Sit to Stand: Mod assist;Max assist;From elevated surface Stand pivot transfers: Total assist       General transfer comment: mod assist to stand from elevated bed into Stedy (1 person assist). pt then dependently transeferd bed to chair via Stedy. At chair pt  performed 3 stands from [facility] lift pads with mod assist. cues to power up through LE's for knee/hip extension. min guard to min assist for balance with the following activities performed in Stedy: 1st stand worked on alternating UE raises with emphasis on maintaining hip/knee extension/tall trunk, 2cd stand- mini squats x 5 reps, 3rd stand mini squats x 5 reps. max assist for controlled descent into low recliner after 3rd stand.       Balance Overall balance assessment: Needs assistance Sitting-balance support: Single extremity supported;Feet supported Sitting balance-Leahy Scale: Fair Sitting balance - Comments: single UE support for balance at edge of bed with max assist to don shoes. pt able to assist with lifting foot up and then pushing LE down into shoe to fully seat heel into shoes.   Standing balance support: Bilateral upper extremity supported Standing balance-Leahy Scale: Poor Standing balance comment: requires up to min assist to maintain standing in stedy with B UE support              Cognition Arousal/Alertness: Awake/alert Behavior During Therapy: WFL for tasks assessed/performed Overall Cognitive Status: Within Functional Limits for tasks assessed               Pertinent Vitals/Pain Pain Assessment: No/denies pain     PT Goals (current goals can now be found in the care plan section) Acute Rehab PT Goals Patient Stated Goal: be able to walk PT Goal Formulation: With patient Time For Goal Achievement: 02/05/21 Potential to Achieve Goals: Fair Progress towards PT goals: Progressing toward goals    Frequency    Min 3X/week  PT Plan Current plan remains appropriate    AM-PAC PT "6 Clicks" Mobility   Outcome Measure  Help needed turning from your back to your side while in a flat bed without using bedrails?: A Lot Help needed moving from lying on your back to sitting on the side of a flat bed without using bedrails?: A Lot Help needed moving  to and from a bed to a chair (including a wheelchair)?: Total Help needed standing up from a chair using your arms (e.g., wheelchair or bedside chair)?: Total Help needed to walk in hospital room?: Total Help needed climbing 3-5 steps with a railing? : Total 6 Click Score: 8    End of Session Equipment Utilized During Treatment: Gait belt;Other (comment) Charlaine Dalton) Activity Tolerance: Patient tolerated treatment well Patient left: in chair;with call bell/phone within reach;with chair alarm set;with nursing/sitter in room Nurse Communication: Mobility status;Need for lift equipment PT Visit Diagnosis: Other abnormalities of gait and mobility (R26.89);Muscle weakness (generalized) (M62.81);Other symptoms and signs involving the nervous system (R29.898)     Time: 4709-2957 PT Time Calculation (min) (ACUTE ONLY): 33 min  Charges:  $Therapeutic Exercise: 8-22 mins $Therapeutic Activity: 8-22 mins                    Willow Ora, PTA, [facility] Acute Rehab Services [phone] 02/05/21, 12:30 PM   Willow Ora 02/05/2021, 12:30 PM

## 2021-02-05 NOTE — Progress Notes (Unsigned)
Patient ID: [name], male   DOB: 1971-01-05, 50 y.o.   MRN: [id number] Patient enrolled for [employer] to ship a 30 day cardiac event monitor to [facility], [address], [place],  [place],   [address].  AttnMarita [place]/ Admissions. Letter with instructions mailed to above address.

## 2021-02-05 NOTE — TOC Progression Note (Addendum)
Transition of Care [facility]) - Progression Note    Patient Details  Name: [name] MRN: [id number] Date of Birth: [birth year]  Transition of Care [facility]) CM/SW [place], RN Phone Number: 02/05/2021, 10:01 AM  Clinical Narrative:    Case management called and left a message with Maudie Mercury. CM at [facility] in [place] to check on bed availability for admission today.  CM and MSW will continue to follow the patient for SNF admission to the facility.  02/05/2021 1321 - CM spoke with Maudie Mercury, CM at [facility] SN[facility] facility and the facility has an available SNF bed ready at the facility today.  The LOG document was documented and sent to [place], [place] at the facility to fax # [phone].  Reino Bellis, NP is going to completed the discharge summary and the rapid COVID screen order was placed by Erin Hearing.  [facility] will be arranged for transport to the facility this afternoon.  The bedside nursing staff will call report to [facility] facility in [place], [place] at [phone].  Patient will be discharged to the SNF facility today.  02/05/2021 1339-  I called and spoke with the bedside RN and she is going to send off a Stat COVID screen on the patient and is aware of patient's transfer to the SNF facility this afternoon via [facility].  [facility] was called and arranged for transport at 4 pm today.  Expected Discharge Plan: [place] Barriers to Discharge: Inadequate or no insurance, Continued Medical Work up  Expected Discharge Plan and Services Expected Discharge Plan: [place] In-house Referral: Clinical Social Work, PCP / Psychologist, educational, Conservation officer, nature Services: Follow-up appt scheduled, Medication Assistance, [facility] Post Acute Care Choice: [place] Living arrangements for the past 2 months: Mobile Home                                       Social Determinants of Health (SDOH)  Interventions    Readmission Risk Interventions Readmission Risk Prevention Plan 01/30/2021  Post Dischage Appt Complete  Medication Screening Complete  Transportation Screening Complete  Some recent data might be hidden

## 2021-02-05 NOTE — Progress Notes (Signed)
[facility] CONSULTANT PROGRESS NOTE  [name] YDX:[id number] DOB: 1971/07/30 DOA: 01/19/2021 PCP: Coral Spikes, DO  Status: Remains inpatient appropriate because:Unsafe d/c plan  Dispo: The patient is from: Home              Anticipated d/c is to: SNF [facility] in the next 48 hours once bed is available              Patient currently is medically stable to d/c.   Difficult to place patient Yes  Level of care: Telemetry Medical  Code Status: Full Family Communication: Patient only DVT prophylaxis: Lovenox COVID vaccination status: Unknown   HPI: 50 y.o. male with medical history significant for known CAD s/p remote stents to RCA, presenting with syncope due to high-grade AV block in the setting of acute inferoposterior STEMI due to simultaneous thrombotic occlusion of old RCA stent and native left circumflex coronary artery, treated with emergency PCI and transient intra-aortic balloon pump support cardiac catheterization.  Subjective: Patient had multiple questions.  Primarily concerned about theology surrounding his neurological deficits.  This was explained to the patient.  He was also instructed to immediately notify healthcare providers if his neurological symptoms worsen.  If his condition worsens then reconsideration for emergency surgery likely necessitate appropriate discontinuation of DAPT.  Continues to report numbness from just below the nipple line down to the toes and weakness in upper and lower extremities.  Objective: Vitals:   02/05/21 0005 02/05/21 0330  BP: (!) 86/60 113/76  Pulse: 73 70  Resp: 18 18  Temp: 98 F (36.7 C) (!) 97.5 F (36.4 C)  SpO2: 99% 100%    Intake/Output Summary (Last 24 hours) at 02/05/2021 0755 Last data filed at 02/05/2021 0600 Gross per 24 hour  Intake 240 ml  Output 800 ml  Net -560 ml   Filed Weights   01/24/21 0444 01/27/21 0500 01/28/21 0409  Weight: 98.6 kg 102.3 kg 102.4 kg     Exam:  Constitutional: Pleasant and in no acute distress.  Laying supine in bed. Respiratory:.  Lungs remain clear, on room air, normal respiratory effort Cardiovascular: Regular, S1-S2, no peripheral edema. Abdomen: LBM 6/19, nontender nondistended with excellent oral intake. Musculoskeletal: No obvious spasticity elicited during examination of RLE. Skin: surgical wound at right transmetatarsal amputation site with eschar and fibrin debris documented. (See picture below)  Neurologic: CN 2-12 grossly intact. bilateral hand grip has improved with right 3-4/5 and left 3/5.  General upper extremity strength is 3-4/5 with right greater than left.  Bilateral lower extremity strength is 4/5.  He continues to demonstrate significant difficulty transitioning from chair to standing position.  Sensation diminished bilaterally from just below nipple line to the feet Psychiatric: Alert and oriented x3 with pleasant affect.   Assessment/Plan: Acute problems: Inferior posterior STEMI with associated cardiogenic shock Remains asymptomatic without chest pain Continue DAPT with aspirin and Brilinta-FREE CARD WILL BE LEFT IN CHART SO PT CAN OBTAIN AFTER DC FROM SNF He also has been given the forms for drug assistance from the Brilinta manufacturer Continue Lipitor, carvedilol and losartan-all of these medications are available at [facility] for $4 for 30-day supply   Atrial fibrillation Currently maintaining sinus rhythm Cardiology documents atrial fibrillation was transient in the context of acute MI; no indication for anticoagulation   Transient third-degree AV block Due to MI and contributed to patient's syncopal presentation No indication for pacemaker   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  Valves are normal. Continue ARB and  beta-blocker   Acute kidney injury/acute renal insufficiency Peak Creatinine 1.42 with slightly elevated BUN, creatinine was 0.9 on 6/9 Repeat electrolytes on 6/15 with a creatinine of 1.05 and a GFR of greater than 60.   Diabetes mellitus 2 with hyperglycemia on insulin HgbA1c 10.4 A1c in February 11.2 Lantus and MC insulin dc'd in favor of preadmit OHAs Continue glyburide 10 mg daily and metformin 500 mg twice daily at discharge Continue SSI Liraglutide would be an excellent medication to use for CAD and and empagliflozin for heart failure but patient would be unlikely to afford these newer  drugs   Bilateral hand weakness/significant cervical stenosis with foraminal narrowing Appreciate assistance of the neurology team. Work-up this admission revealed significant C-spine stenosis with foraminal narrowing will to consider potential surgical intervention due to need for uninterrupted DAPT after recent MRI Neurology recommended beginning Lyrica and uptitrate as needed for pain management with a max dose of 600 mg/day  Continue Lyrica to 100 mg 3 times daily Likely muscle spasms reflect spasticity from cervical stenosis- after dc to SNF the facility provider may need to further uptitrate Baclofen -this medication has already improved overall muscle spasticity and bilateral hand grip related to spasticity -rec OT and PT after dc Although he will need to have a repeat DOT physical to work in regards to recent hospitalization for syncope secondary to acute cardiac event it is suspected 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  He continues to demonstrate difficulty with mobility including moving from seated to standing position, he is unable to use his arms appropriately and has very diminished grip strength.  It is hopeful that these symptoms will improve after cervical stenosis surgery but the surgery cannot be  achieved until after it is safe for him to come off of DAPT.  He has initiated Medicaid and disability applications.  PAD/recent left transmetatarsal amputation Slow to heal likely secondary to poorly controlled diabetes Primarily with eschar in the mid base some open area with red granular tissue and maceration on the incision line posteriorly Patient has offloading boot given to him during previous admission. Appreciate WOC RN assistance  Continue Baclofen for cervical stenosis mediated muscle spasms on the right leg extending up into the back -Lyrica increased as above   Physical deconditioning Does not have 24/7 care available after discharge therefore not a candidate for CIR Plan is to pursue additional rehabilitative therapies at a skilled nursing facility  Dyslipidemia LDL elevated at 136 with normal HDL cholesterol Continue current statin        Data Reviewed: Basic Metabolic Panel: Recent Labs  Lab 01/31/21 0501  NA 136  K 3.7  CL 100  CO2 26  GLUCOSE 102*  BUN 16  CREATININE 1.05  CALCIUM 8.8*   Liver Function Tests: No results for input(s): AST, ALT, ALKPHOS, BILITOT, PROT, ALBUMIN in the last 168 hours. No results for input(s): LIPASE, AMYLASE in the last 168 hours. No results for input(s): AMMONIA in the last 168 hours. CBC: No results for input(s): WBC, NEUTROABS, HGB, HCT, MCV, PLT in the last 168 hours.  Cardiac Enzymes: No results for input(s): CKTOTAL, CKMB, CKMBINDEX, TROPONINI in the last 168 hours. BNP (last 3 results) No results for input(s): BNP in the last 8760 hours.  ProBNP (last  3 results) No results for input(s): PROBNP in the last 8760 hours.  CBG: Recent Labs  Lab 02/03/21 2124 02/04/21 0620 02/04/21 1202 02/04/21 1554 02/05/21 0617  GLUCAP 138* 137* 131* 91 101*    No results found for this or any previous visit (from the past 240 hour(s)).   Studies: No results found.  Scheduled Meds:  aspirin EC  81 mg Oral Daily    atorvastatin  80 mg Oral Daily   baclofen  5 mg Oral TID   carvedilol  6.25 mg Oral BID WC   enoxaparin (LOVENOX) injection  40 mg Subcutaneous Q24H   glipiZIDE  10 mg Oral QAC breakfast   insulin aspart  0-15 Units Subcutaneous TID WC   losartan  12.5 mg Oral Daily   melatonin  3 mg Oral QHS   metFORMIN  500 mg Oral BID WC   pregabalin  100 mg Oral TID   ticagrelor  90 mg Oral BID   Continuous Infusions:  Principal Problem:   STEMI involving right coronary artery (HCC) Active Problems:   Coronary artery disease involving native coronary artery of native heart without angina pectoris   Uncontrolled type 2 diabetes mellitus with circulatory disorder, without long-term current use of insulin (HCC)   Hyperlipidemia   Tobacco abuse   Cardiogenic shock ([facility])   Weakness of both arms   Cervical stenosis of spine   Cervical myelopathy [facility])   Physical deconditioning   Consultants: Neurology  Procedures: Cardiac catheterization Echocardiogram  Antibiotics: Anti-infectives (From admission, onward)    None         Time spent: 25 minutes    Erin Hearing ANP  [facility] 7 am - 330 pm/M-F for direct patient care and secure chat Please refer to Amion for contact info 17  days

## 2021-02-05 NOTE — Progress Notes (Signed)
Order written for CR inpatient education (pt unable to ambulate yet due to spinal stenosis). Discussed MI, stent, Brilinta importance, diet, NTG, and CRPII. Pt receptive. He knows he needs better diet/DM control. Discussed the role of regular soda. He would benefit from more education when he is closer to d/c to home. Will refer to [facility] to meet requirements however he is not appropriate right now.  [phone]

## 2021-02-05 NOTE — Progress Notes (Signed)
Called an gave report to nurse supervisor Sharyn Lull, LPN. She verbalized understanding. Informed them that COVID test was negative and patient current blood sugar level. Sharyn Lull, LPN verbalized understanding.

## 2021-02-05 NOTE — Discharge Summary (Signed)
Discharge Summary    Patient ID: WINFIELD CABA MRN: [id number]; DOB: 1970/11/05  Admit date: 01/19/2021 Discharge date: 02/05/2021  PCP:  Coral Spikes, DO   [facility] Providers Cardiologist:  Larae Grooms, MD   {     Discharge Diagnoses    Principal Problem:   STEMI involving right coronary artery [facility]) Active Problems:   Coronary artery disease involving native coronary artery of native heart without angina pectoris   Uncontrolled type 2 diabetes mellitus with circulatory disorder, without long-term current use of insulin (HCC)   Hyperlipidemia   Tobacco abuse   Cardiogenic shock (HCC)   Weakness of both arms   Cervical stenosis of spine   Cervical myelopathy (HCC)   Physical deconditioning   Diagnostic Studies/Procedures    Echocardiogram: 01/20/2021    1. Left ventricular ejection fraction, by estimation, is 40%. The left  ventricle has moderately decreased function. The left ventricle  demonstrates regional wall motion abnormalities (see scoring  diagram/findings for description). Left ventricular  diastolic parameters are indeterminate. There is severe hypokinesis of the  left ventricular, entire inferior wall and inferolateral wall.   2. Right ventricular systolic function is mildly reduced. The right  ventricular size is normal. Tricuspid regurgitation signal is inadequate  for assessing PA pressure.   3. The mitral valve is normal in structure. No evidence of mitral valve  regurgitation.   4. The aortic valve is tricuspid. Aortic valve regurgitation is not  visualized. Mild aortic valve sclerosis is present, with no evidence of  aortic valve stenosis.   5. The inferior vena cava is dilated in size with <50% respiratory  variability, suggesting right atrial pressure of 15 mmHg.     CATH: 01/19/2021   Mid RCA lesion is 100% stenosed- very late stent thrombosis. Aspiration thrombectomy was performed which restored TIMI-3 flow. Balloon angioplasty  was performed using a BALLOON [place] [id number]. Post intervention, there is a 0% residual stenosis. Mid Cx lesion is 100% stenosed. A drug-eluting stent was successfully placed using a STENT RESOLUTE ONYX 2.5X15, postdilated to greater than 3 mm. Post intervention, there is a 0% residual stenosis. LV end diastolic pressure is mildly elevated. There is no aortic valve stenosis. Temporary pacemaker placement was used. Heart rate improved after PCI of the RCA. The temporary pacer was removed. Successful placement of the intra-aortic balloon pump.   Dual antiplatelet therapy for 12 months along with aggressive secondary prevention.  Very late stent thrombosis of the RCA likely due to his antiplatelet therapy being stopped back in February.  Second acute occlusion of the circumflex.     Transfer to [facility] given his intra-aortic balloon pump.   Diagnostic Dominance: Right      Intervention        _____________   History of Present Illness     [name] is a 50 y.o. 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  He walked around the back of his trunk and had sudden loss of consciousness.  He he awoke after EMS arrived.  He had no chest discomfort before or after this event.  He had no dyspnea before or after this event.  He had no lightheadedness, dizziness, or presyncope which previously preceded  the event.  He has never had an event like this before.   When EMS arrived, EKG was concerning for ST elevation.  He was brought emergently to [facility].  Upon arrival, he was noted to be bradycardic to the 40s with transient losses of consciousness.  He was taken emergently for cardiac catheterization where he was found to  have 2 acute lesions: Late in-stent thrombosis of the RCA and acute occlusion of the left circumflex.  He received aspiration thrombectomy and balloon angioplasty of the RCA and PCI to the left circumflex.  He had atrial fibrillation with RVR during the case and was treated with low-dose metoprolol as well as IV amiodarone.  Due to hypotension, and intra-aortic balloon pump was placed.  He was supported by a transvenous pacemaker during the procedure.  After revascularization, his heart rate improved and the transvenous pacemaker was removed.   On arrival to [facility], he reported that he still had no chest discomfort.  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   Hospital Course     Consultants: Neurology, TRH   Inferior STEMI: Presented after 2 weeks of stopping clopidogrel.  Two-vessel thrombotic occlusion.  This appeared to be 2 acute vessel occlusions in the RCA and circumflex.  Cath showed late stent thrombosis of the RCA stent.  Underwent aspiration thrombectomy which was followed by balloon angioplasty with TIMI-3 flow restored.  PCI/DES x1 placed to mid left circumflex.  Placed on dual antiplatelet therapy with aspirin and Brilinta for at least 1 year. --Continue aspirin/Brilinta, Lipitor 80 mg daily, carvedilol 6.25 mg twice daily and losartan 12.5 mg daily. Financial assistance forms for Brilinta submitted at discharge.    Cardiogenic shock: Initially required balloon pump, was able to wean and tolerated the addition of BB and ARB   Paroxysmal atrial fibrillation: Transient, suspect it was related to acute MI.  No recurrence during admission.  Given his unusual presentation, was suggested that he discharged with 30-day monitor as an outpatient to assess for recurrent atrial fibrillation. This will be mailed to SNF for placement after discharge.   Third-degree AV block: Transient, ischemic mechanism.  Resolved and able to  tolerate beta-blocker.   Ischemic cardiomyopathy: Echo on 6/4 noted EF of 40% with hypokinesis of the LV, entire inferior wall as well as lateral wall involvement. -- Coreg 6.25 mg twice daily, losartan 12.5 mg daily. Blood pressures to soft for transition to [facility] prior to discharge.   AKI: Creatinine peaked at 1.4.  Improved to within normal limits prior to discharge   Hyperlipidemia: LDL 136, noncompliance with statin prior to admission --Atorvastatin 80 mg daily --Will need fasting lipid panel and LFTs in 8 weeks   Bilateral hand weakness: Significant cervical spine stenosis and foraminal narrowing.  Evaluated by neurology during admission.  May eventually need to consider surgery but with recent MI and need for uninterrupted DAPT this will have to be delayed a minimum of 6 months. --Gabapentin was switched to Lyrica --would benefit from outpatient appt with Dr. Courtney Heys with [place] [facility]    Diabetes: Poorly controlled, globin A1c 10.4 --SSI while inpatient, metformin/glipizide resumed. Will need to continue insulin regimen at SNF --started on Farxgia prior to discharge  Coordination with [facility] HF clinic prior to discharge. Financial assistance forms completed prior to discharge. Follow up arranged in the [facility] clinic.   Did the patient have an acute coronary syndrome (  MI, NSTEMI, STEMI, etc) this admission?:  Yes                               AHA/ACC Clinical Performance & Quality Measures: Aspirin prescribed? - Yes ADP Receptor Inhibitor (Plavix/Clopidogrel, Brilinta/Ticagrelor or Effient/Prasugrel) prescribed (includes medically managed patients)? - Yes Beta Blocker prescribed? - Yes High Intensity Statin (Lipitor 40-77m or Crestor 20-469m prescribed? - Yes EF assessed during THIS hospitalization? - Yes For EF <40%, was ACEI/ARB prescribed? - Yes For EF <40%, Aldosterone Antagonist (Spironolactone or Eplerenone) prescribed? - No - Reason:  consider at  outpatient follow up Cardiac Rehab Phase II ordered (including medically managed patients)? - Yes      _____________  Discharge Vitals Blood pressure 104/68, pulse 77, temperature 97.6 F (36.4 C), temperature source Oral, resp. rate 17, weight 102.4 kg, SpO2 99 %.  Filed Weights   01/24/21 0444 01/27/21 0500 01/28/21 0409  Weight: 98.6 kg 102.3 kg 102.4 kg    Labs & Radiologic Studies    CBC No results for input(s): WBC, NEUTROABS, HGB, HCT, MCV, PLT in the last 72 hours. Basic Metabolic Panel No results for input(s): NA, K, CL, CO2, GLUCOSE, BUN, CREATININE, CALCIUM, MG, PHOS in the last 72 hours. Liver Function Tests No results for input(s): AST, ALT, ALKPHOS, BILITOT, PROT, ALBUMIN in the last 72 hours. No results for input(s): LIPASE, AMYLASE in the last 72 hours. High Sensitivity Troponin:   Recent Labs  Lab 01/19/21 2014  TROPONINIHS 2,012*    BNP Invalid input(s): POCBNP D-Dimer No results for input(s): DDIMER in the last 72 hours. Hemoglobin A1C No results for input(s): HGBA1C in the last 72 hours. Fasting Lipid Panel No results for input(s): CHOL, HDL, LDLCALC, TRIG, CHOLHDL, LDLDIRECT in the last 72 hours. Thyroid Function Tests No results for input(s): TSH, T4TOTAL, T3FREE, THYROIDAB in the last 72 hours.  Invalid input(s): FREET3 _____________  CT CERVICAL SPINE WO CONTRAST  Result Date: 01/22/2021 CLINICAL DATA:  Progressive myelopathy.  Limited MRI examination. EXAM: CT CERVICAL SPINE WITHOUT CONTRAST TECHNIQUE: Multidetector CT imaging of the cervical spine was performed without intravenous contrast. Multiplanar CT image reconstructions were also generated. COMPARISON:  Limited attempted MRI examinations, same date. FINDINGS: Alignment: Normal Skull base and vertebrae: No bone lesions or fractures. The skull base C1 and C1-2 articulations are maintained. The dens is intact. Soft tissues and spinal canal: No evidence of canal hematoma or prevertebral soft  tissue swelling. Disc levels:  C2-3: No significant findings. C3-4: Mild bulging annulus and mild uncinate spurring changes with mild spinal and bilateral foraminal stenosis. C4-5: Degenerative disc disease with disc space narrowing and osteophytic spurring. Bulging annulus and shallow central disc protrusion along with uncinate spurring changes contributing to moderate spinal and bilateral foraminal stenosis. C5-6: Moderate to large broad-based disc protrusion asymmetric left with mass effect on the ventral thecal sac and obliteration of the ventral CSF space. No significant foraminal stenosis. C6-7: Shallow broad-based disc protrusion asymmetric left. No significant spurring changes or foraminal stenosis. C7-T1: Small central disc protrusion.  No foraminal stenosis. Upper chest: No significant findings. Other: No neck mass or adenopathy. The thyroid gland is grossly normal. IMPRESSION: 1. Normal alignment and no acute bony findings. 2. Multilevel disc disease as detailed above. 3. Moderate to large broad-based disc protrusion asymmetric left at C5-6 with mass effect on the ventral thecal sac and obliteration of the ventral CSF space. 4. Moderate spinal and bilateral foraminal stenosis at  C4-5. 5. Mild spinal and bilateral foraminal stenosis at C3-4. 6. Shallow broad-based disc protrusion asymmetric left at C6-7. Electronically Signed   By: Marijo Sanes M.D.   On: 01/22/2021 11:26   MR ANGIO HEAD WO CONTRAST  Result Date: 01/21/2021 CLINICAL DATA:  Sudden loss of consciousness.  Recent STEMI. EXAM: MRI HEAD WITHOUT CONTRAST MRA HEAD WITHOUT CONTRAST TECHNIQUE: Multiplanar, multi-echo pulse sequences of the brain and surrounding structures were acquired without intravenous contrast. Angiographic images of the Circle of Willis were acquired using MRA technique without intravenous contrast. COMPARISON: No pertinent prior exam. COMPARISON:  No pertinent prior exam. FINDINGS: MRI HEAD FINDINGS Motion degraded study.  Brain: There are 3 punctate foci of abnormal diffusion restriction, which are located in the left parietal lobe, left occipital lobe and left cerebellum. No acute hemorrhage or mass effect. No acute or chronic hemorrhage. Normal white matter signal, parenchymal volume and CSF spaces. The midline structures are normal. Vascular: Major flow voids are preserved. Skull and upper cervical spine: Normal calvarium and skull base. Visualized upper cervical spine and soft tissues are normal. Sinuses/Orbits:No paranasal sinus fluid levels or advanced mucosal thickening. No mastoid or middle ear effusion. Normal orbits. MRA HEAD FINDINGS POSTERIOR CIRCULATION: --Vertebral arteries: Normal --Inferior cerebellar arteries: Normal. --Basilar artery: Normal. --Superior cerebellar arteries: Normal. --Posterior cerebral arteries: Normal. ANTERIOR CIRCULATION: --Intracranial internal carotid arteries: Normal. --Anterior cerebral arteries (ACA): Normal. --Middle cerebral arteries (MCA): Normal. ANATOMIC VARIANTS: None IMPRESSION: 1. Motion degraded examination. 2. 3 punctate foci of acute ischemia, within the left parietal lobe, left occipital lobe and left cerebellum. No hemorrhage or mass effect. 3. Normal intracranial MRA. Electronically Signed   By: Ulyses Jarred M.D.   On: 01/21/2021 19:16   MR BRAIN WO CONTRAST  Result Date: 01/21/2021 CLINICAL DATA:  Sudden loss of consciousness.  Recent STEMI. EXAM: MRI HEAD WITHOUT CONTRAST MRA HEAD WITHOUT CONTRAST TECHNIQUE: Multiplanar, multi-echo pulse sequences of the brain and surrounding structures were acquired without intravenous contrast. Angiographic images of the Circle of Willis were acquired using MRA technique without intravenous contrast. COMPARISON: No pertinent prior exam. COMPARISON:  No pertinent prior exam. FINDINGS: MRI HEAD FINDINGS Motion degraded study. Brain: There are 3 punctate foci of abnormal diffusion restriction, which are located in the left parietal lobe,  left occipital lobe and left cerebellum. No acute hemorrhage or mass effect. No acute or chronic hemorrhage. Normal white matter signal, parenchymal volume and CSF spaces. The midline structures are normal. Vascular: Major flow voids are preserved. Skull and upper cervical spine: Normal calvarium and skull base. Visualized upper cervical spine and soft tissues are normal. Sinuses/Orbits:No paranasal sinus fluid levels or advanced mucosal thickening. No mastoid or middle ear effusion. Normal orbits. MRA HEAD FINDINGS POSTERIOR CIRCULATION: --Vertebral arteries: Normal --Inferior cerebellar arteries: Normal. --Basilar artery: Normal. --Superior cerebellar arteries: Normal. --Posterior cerebral arteries: Normal. ANTERIOR CIRCULATION: --Intracranial internal carotid arteries: Normal. --Anterior cerebral arteries (ACA): Normal. --Middle cerebral arteries (MCA): Normal. ANATOMIC VARIANTS: None IMPRESSION: 1. Motion degraded examination. 2. 3 punctate foci of acute ischemia, within the left parietal lobe, left occipital lobe and left cerebellum. No hemorrhage or mass effect. 3. Normal intracranial MRA. Electronically Signed   By: Ulyses Jarred M.D.   On: 01/21/2021 19:16   MR CERVICAL SPINE WO CONTRAST  Result Date: 01/22/2021 CLINICAL DATA:  Progressive myelopathy EXAM: MRI CERVICAL SPINE WITHOUT CONTRAST TECHNIQUE: Multiplanar, multisequence MR imaging of the cervical spine was performed. No intravenous contrast was administered. COMPARISON:  None. FINDINGS: Examination is severely degraded by motion.  Only sagittal T2-weighted imaging was obtained. The cervical spinal canal appears severely stenotic at the C4-7 levels. IMPRESSION: 1. Severely motion degraded and truncated examination. 2. Apparent severe spinal canal stenosis at the C4-7 levels. CT of the cervical spine might be helpful, due to shorter scan time. Electronically Signed   By: Ulyses Jarred M.D.   On: 01/22/2021 03:29   MR CERVICAL SPINE W WO  CONTRAST  Result Date: 01/23/2021 CLINICAL DATA:  Initial evaluation for spinal stenosis. EXAM: MRI CERVICAL SPINE WITHOUT AND WITH CONTRAST TECHNIQUE: Multiplanar and multiecho pulse sequences of the cervical spine, to include the craniocervical junction and cervicothoracic junction, were obtained without and with intravenous contrast. CONTRAST:  78m GADAVIST GADOBUTROL 1 MMOL/ML IV SOLN COMPARISON:  Prior CT from 01/22/2021. FINDINGS: Alignment: Examination technically limited as the patient was unable to tolerate the full length of the exam. Additionally, several sequences are fairly degraded by motion artifact. Straightening of the normal cervical lordosis. Trace retrolisthesis of C4 on C5, chronic and degenerative. Vertebrae: Vertebral body height maintained without acute or chronic fracture. Bone marrow signal intensity within normal limits. No discrete or worrisome osseous lesions. No abnormal marrow edema. Cord: Patchy foci of cord signal abnormality seen involving the bilateral cord at the level of C4-5, C5-6, and likely C7-T1, most consistent with chronic myelomalacia. Appearance is most pronounced at the C4-5 level (series 5, image 9). A degree of underlying cord atrophy is suspected. No definite abnormal enhancement. Posterior Fossa, vertebral arteries, paraspinal tissues: Visualized brain and posterior fossa within normal limits. Craniocervical junction normal. Paraspinous and prevertebral soft tissues within normal limits. Normal flow voids seen within the vertebral arteries bilaterally. Disc levels: Degree of underlying congenital spinal stenosis is present. C2-C3: Negative interspace. Left-sided facet hypertrophy. No canal or foraminal stenosis. C3-C4: Diffuse disc bulge with right greater than left uncovertebral hypertrophy. Mild left greater than right facet degeneration with mild ligament flavum thickening. Posterior disc osteophyte flattens and partially faces the ventral thecal sac, asymmetric  to the right. Mild to moderate spinal stenosis. Moderate right worse than left C4 foraminal narrowing. C4-C5: Degenerative intervertebral disc space narrowing with diffuse disc osteophyte complex. Broad posterior component flattens and effaces the ventral thecal sac. Superimposed ligament flavum hypertrophy. Resultant severe spinal stenosis with associated cord flattening and cord signal changes, consistent with chronic myelomalacia. Thecal sac measures 5 mm in AP diameter at its most narrow point. Severe bilateral C5 foraminal stenosis. C5-C6: Broad-based posterior disc osteophyte complex flattens and effaces the ventral thecal sac. Superimposed facet and ligament flavum hypertrophy. Changes superimposed on short pedicles resultant severe spinal stenosis. Associated cord flattening with cord signal changes consistent with myelomalacia. Thecal sac measures 4 mm in AP diameter at its most narrow point. Severe bilateral C6 foraminal narrowing. C6-C7: Disc bulge with bilateral uncovertebral hypertrophy. Broad-based posterior disc osteophyte effaces the ventral thecal sac. Superimposed mild facet and ligament flavum hypertrophy. Resultant mild to moderate spinal stenosis. Severe left with moderate right C7 foraminal stenosis. C7-T1: Mild disc bulge with uncovertebral hypertrophy. Superimposed central disc protrusion contacts and mildly flattens the ventral cord (series 8, image 36). Suspected subtle cord signal changes as above. Superimposed facet and ligament flavum hypertrophy. Moderate spinal stenosis. Mild to moderate bilateral C8 foraminal narrowing. Visualized upper thoracic spine demonstrates no significant finding. IMPRESSION: 1. 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 2. Patchy foci of cord signal abnormality involving the cord at C4-5, C5-6, and likely C7-T1, consistent with chronic  myelomalacia. Appearance is most pronounced at the C4-5 level.  3. Multifactorial degenerative changes with resultant multilevel foraminal narrowing as above. Notable findings include moderate bilateral C4 foraminal stenosis, severe bilateral C5 and C6 foraminal narrowing, with severe left and moderate right C7 foraminal stenosis. Electronically Signed   By: Jeannine Boga M.D.   On: 01/23/2021 20:30   CARDIAC CATHETERIZATION  Result Date: 01/19/2021  Mid RCA lesion is 100% stenosed- very late stent thrombosis. Aspiration thrombectomy was performed which restored TIMI-3 flow.  Balloon angioplasty was performed using a BALLOON [place] [id number].  Post intervention, there is a 0% residual stenosis.  Mid Cx lesion is 100% stenosed.  A drug-eluting stent was successfully placed using a STENT RESOLUTE ONYX 2.5X15, postdilated to greater than 3 mm.  Post intervention, there is a 0% residual stenosis.  LV end diastolic pressure is mildly elevated.  There is no aortic valve stenosis.  Temporary pacemaker placement was used. Heart rate improved after PCI of the RCA. The temporary pacer was removed.  Successful placement of the intra-aortic balloon pump.  Dual antiplatelet therapy for 12 months along with aggressive secondary prevention.  Very late stent thrombosis of the RCA likely due to his antiplatelet therapy being stopped back in February.  Second acute occlusion of the circumflex.  Transfer to [facility] given his intra-aortic balloon pump.   ECHOCARDIOGRAM COMPLETE  Result Date: 01/20/2021    ECHOCARDIOGRAM REPORT   Patient Name:   [name] Date of Exam: 01/20/2021 Medical Rec #:  [id number]       Height:       74.0 in Accession #:    [id number]      Weight:       220.0 lb Date of Birth:  [birth year]       BSA:          2.263 m Patient Age:    50 years        BP:           104/74 mmHg Patient Gender: M               HR:           87 bpm. Exam Location:  Inpatient Procedure: 2D Echo, Color Doppler, Cardiac Doppler and Intracardiac            Opacification  Agent Indications:    Acute MI i21.9  History:        Patient has no prior history of Echocardiogram examinations.                 CAD; Risk Factors:Hypertension, Diabetes and Dyslipidemia.  Sonographer:    Raquel Sarna Senior RDCS Referring Phys: [id number] KELLY E ARPS  Sonographer Comments: Positioning limited by balloon pump. IMPRESSIONS  1. Left ventricular ejection fraction, by estimation, is 40%. The left ventricle has moderately decreased function. The left ventricle demonstrates regional wall motion abnormalities (see scoring diagram/findings for description). Left ventricular diastolic parameters are indeterminate. There is severe hypokinesis of the left ventricular, entire inferior wall and inferolateral wall.  2. Right ventricular systolic function is mildly reduced. The right ventricular size is normal. Tricuspid regurgitation signal is inadequate for assessing PA pressure.  3. The mitral valve is normal in structure. No evidence of mitral valve regurgitation.  4. The aortic valve is tricuspid. Aortic valve regurgitation is not visualized. Mild aortic valve sclerosis is present, with no evidence of aortic valve stenosis.  5. The inferior vena cava is dilated in size with <  50% respiratory variability, suggesting right atrial pressure of 15 mmHg. FINDINGS  Left Ventricle: Left ventricular ejection fraction, by estimation, is 40%. The left ventricle has moderately decreased function. The left ventricle demonstrates regional wall motion abnormalities. Severe hypokinesis of the left ventricular, entire inferior wall and inferolateral wall. Definity contrast agent was given IV to delineate the left ventricular endocardial borders. The left ventricular internal cavity size was normal in size. There is no left ventricular hypertrophy. Left ventricular diastolic parameters are indeterminate. Indeterminate filling pressures. Right Ventricle: The right ventricular size is normal. No increase in right ventricular wall  thickness. Right ventricular systolic function is mildly reduced. Tricuspid regurgitation signal is inadequate for assessing PA pressure. Left Atrium: Left atrial size was normal in size. Right Atrium: Right atrial size was normal in size. Pericardium: There is no evidence of pericardial effusion. Mitral Valve: The mitral valve is normal in structure. No evidence of mitral valve regurgitation. Tricuspid Valve: The tricuspid valve is normal in structure. Tricuspid valve regurgitation is not demonstrated. Aortic Valve: The aortic valve is tricuspid. Aortic valve regurgitation is not visualized. Mild aortic valve sclerosis is present, with no evidence of aortic valve stenosis. Pulmonic Valve: The pulmonic valve was grossly normal. Pulmonic valve regurgitation is not visualized. Aorta: The aortic root and ascending aorta are structurally normal, with no evidence of dilitation. Venous: The inferior vena cava is dilated in size with less than 50% respiratory variability, suggesting right atrial pressure of 15 mmHg. IAS/Shunts: No atrial level shunt detected by color flow Doppler.  LEFT VENTRICLE PLAX 2D LVIDd:         5.10 cm  Diastology LVIDs:         4.10 cm  LV e' medial:    5.22 cm/s LV PW:         1.10 cm  LV E/e' medial:  9.5 LV IVS:        1.00 cm  LV e' lateral:   3.92 cm/s LVOT diam:     2.00 cm  LV E/e' lateral: 12.7 LV SV:         30 LV SV Index:   13 LVOT Area:     3.14 cm  RIGHT VENTRICLE RV S prime:     6.96 cm/s TAPSE (M-mode): 1.0 cm LEFT ATRIUM             Index       RIGHT ATRIUM           Index LA diam:        3.10 cm 1.37 cm/m  RA Area:     14.60 cm LA Vol (A2C):   53.6 ml 23.68 ml/m RA Volume:   44.30 ml  19.57 ml/m LA Vol (A4C):   42.7 ml 18.87 ml/m LA Biplane Vol: 49.4 ml 21.83 ml/m  AORTIC VALVE LVOT Vmax:   57.40 cm/s LVOT Vmean:  42.500 cm/s LVOT VTI:    0.094 m  AORTA Ao Root diam: 3.10 cm Ao Asc diam:  3.20 cm MITRAL VALVE MV Area (PHT): 4.44 cm    SHUNTS MV Decel Time: 171 msec     Systemic VTI:  0.09 m MV E velocity: 49.70 cm/s  Systemic Diam: 2.00 cm MV A velocity: 45.80 cm/s MV E/A ratio:  1.09 Mihai Croitoru MD Electronically signed by Sanda Klein MD Signature Date/Time: 01/20/2021/12:14:44 PM    Final    Disposition   Pt is being discharged home today in good condition.  Follow-up Plans & Appointments  Follow-up Information     [place]. Go on 02/15/2021.   Why: You are scheduled for a hospital follow up on March 09, 2021 at 09:30 am. Contact information: [place] [address] [phone]        [facility] Follow up on 02/13/2021.   Specialty: Cardiology Why: @3pm  for your follow up appt Contact information: [address] [id number] [place] [phone]               Discharge Instructions     Ambulatory referral to Neurology   Complete by: As directed    An appointment is requested in approximately: 4-6 wks bilateral upper extremity weakness and left leg weakness, EMG/nerve conduction study suggested   Ambulatory referral to Neurosurgery   Complete by: As directed    Outpatient f/u for severe cervical spine stenosis, d/w Dr. Zada Finders during hospitalization 01/24/21   Diet - low sodium heart healthy   Complete by: As directed    Discharge instructions   Complete by: As directed    PLEASE DO NOT [place]!!!!! Also keep a log of you blood pressures and bring back to your follow up appt. Please call the office with any questions.   Patients taking blood thinners should generally stay away from medicines like ibuprofen, Advil, Motrin, naproxen, and Aleve due to risk of stomach bleeding. You may take Tylenol as directed or talk to your primary doctor about alternatives.  PLEASE ENSURE THAT YOU DO NOT RUN OUT OF YOUR BRILINTA. This medication is very important to remain on  for at least one year. IF you have issues obtaining this medication due to cost please CALL the office 3-5 business days prior to running out in order to prevent missing doses of this medication.   Discharge wound care:   Complete by: As directed    Clean right foot wound with NS, pat dry and place Xeroform gauze on wound and wrap with Kerlix. Change daily   Increase activity slowly   Complete by: As directed        Discharge Medications   Allergies as of 02/05/2021   No Known Allergies      Medication List     STOP taking these medications    amoxicillin-clavulanate 875-125 MG tablet Commonly known as: AUGMENTIN   Basaglar KwikPen 100 UNIT/ML   Comfort EZ Pen Needles 32G X 4 MM Misc Generic drug: Insulin Pen Needle   gabapentin 300 MG capsule Commonly known as: NEURONTIN   glucose blood test strip Commonly known as: ReliOn Prime Test   HumaLOG KwikPen 100 UNIT/ML KwikPen Generic drug: insulin lispro   insulin lispro 100 UNIT/ML KwikPen Commonly known as: HUMALOG   lisinopril 5 MG tablet Commonly known as: ZESTRIL   meloxicam 7.5 MG tablet Commonly known as: MOBIC   methocarbamol 750 MG tablet Commonly known as: ROBAXIN   Pen Needles 32G X 6 MM Misc   ReliOn Prime Monitor Devi   ReliOn Ultra Thin Lancets Misc   sildenafil 25 MG tablet Commonly known as: VIAGRA       TAKE these medications    acetaminophen 325 MG tablet Commonly known as: TYLENOL Take 2 tablets (650 mg total) by mouth every 4 (four) hours as needed for mild pain, fever or headache.   albuterol 108 (90 Base) MCG/ACT inhaler Commonly known as: VENTOLIN HFA Inhale 2 puffs into the lungs  every 6 (six) hours as needed for wheezing or shortness of breath.   aspirin 81 MG EC tablet Take 1 tablet (81 mg total) by mouth daily. Swallow whole.   atorvastatin 80 MG tablet Commonly known as: LIPITOR Take 1 tablet (80 mg total) by mouth daily. What changed:  medication strength how  much to take   Baclofen 5 MG Tabs Take 5 mg by mouth 3 (three) times daily.   carvedilol 6.25 MG tablet Commonly known as: COREG Take 1 tablet (6.25 mg total) by mouth 2 (two) times daily with a meal. What changed:  medication strength how much to take Another medication with the same name was removed. Continue taking this medication, and follow the directions you see here.   dapagliflozin propanediol 10 MG Tabs tablet Commonly known as: FARXIGA Take 1 tablet (10 mg total) by mouth daily.   fluticasone 50 MCG/ACT nasal spray Commonly known as: FLONASE Place 2 sprays into both nostrils daily. What changed: Another medication with the same name was removed. Continue taking this medication, and follow the directions you see here.   glipiZIDE 10 MG tablet Commonly known as: GLUCOTROL Take 10 mg by mouth daily.   HYDROcodone-acetaminophen 5-325 MG tablet Commonly known as: NORCO/VICODIN Take 1 tablet by mouth every 6 (six) hours as needed for severe pain.   insulin aspart 100 UNIT/ML injection Commonly known as: novoLOG Inject 0-15 Units into the skin 3 (three) times daily with meals. Do NOT hold if patient is NPO. Moderate Scale.   losartan 25 MG tablet Commonly known as: COZAAR Take 0.5 tablets (12.5 mg total) by mouth daily.   melatonin 3 MG Tabs tablet Take 1 tablet (3 mg total) by mouth at bedtime.   metFORMIN 500 MG tablet Commonly known as: GLUCOPHAGE TAKE ONE TABLET BY MOUTH TWICE DAILY WITH A MEAL   nitroGLYCERIN 0.4 MG SL tablet Commonly known as: NITROSTAT Place 1 tablet (0.4 mg total) under the tongue every 5 (five) minutes x 3 doses as needed for chest pain.   polyethylene glycol 17 g packet Commonly known as: MIRALAX / GLYCOLAX Take 17 g by mouth daily as needed for moderate constipation or severe constipation.   pregabalin 100 MG capsule Commonly known as: LYRICA Take 1 capsule (100 mg total) by mouth 3 (three) times daily.   senna-docusate 8.6-50  MG tablet Commonly known as: Senokot-S Take 2 tablets by mouth at bedtime as needed for mild constipation.   ticagrelor 90 MG Tabs tablet Commonly known as: BRILINTA Take 1 tablet (90 mg total) by mouth 2 (two) times daily.               Discharge Care Instructions  (From admission, onward)           Start     Ordered   02/05/21 0000  Discharge wound care:       Comments: Clean right foot wound with NS, pat dry and place Xeroform gauze on wound and wrap with Kerlix. Change daily   02/05/21 1354               Outstanding Labs/Studies   FLP/LFTs in 8 weeks   Outpatient cardiac monitor  Duration of Discharge Encounter   Greater than 30 minutes including physician time.  Signed, Reino Bellis, NP 02/05/2021, 1:56 PM

## 2021-02-05 NOTE — Plan of Care (Signed)

## 2021-02-05 NOTE — Progress Notes (Signed)
Heart Failure Patient Advocate Encounter  [place] Program sent in an effort to reduce the patient's out of pocket expense for [place] and Brilinta to $0.    Application completed and faxed to [phone]   [place] patient [place] phone number for follow up is [phone].   Kerby Nora, PharmD, BCPS Heart Failure Stewardship Pharmacist Phone [phone]  Please check [email or web address] for unit-specific pharmacist phone numbers

## 2021-02-07 ENCOUNTER — Encounter (HOSPITAL_COMMUNITY): Payer: Self-pay

## 2021-02-08 ENCOUNTER — Ambulatory Visit (INDEPENDENT_AMBULATORY_CARE_PROVIDER_SITE_OTHER): Payer: Self-pay

## 2021-02-08 DIAGNOSIS — R002 Palpitations: Secondary | ICD-10-CM

## 2021-02-08 DIAGNOSIS — I480 Paroxysmal atrial fibrillation: Secondary | ICD-10-CM

## 2021-02-09 ENCOUNTER — Telehealth: Payer: Self-pay

## 2021-02-09 NOTE — Telephone Encounter (Signed)
Patient has also been approved to receive Farxiga through 05/09/21.  They have also mentioned that the temporary approval determination was made because he is Medicaid eligible. If he is denied from [employer], then they will be able to extend his enrollment.

## 2021-02-09 NOTE — Telephone Encounter (Signed)
**Note De-Identified [employer] Obfuscation** Letter received [place] fax from [facility] and [place] stating that they have approved the pt for asst with his Brilinta until 05/09/21 and that they will notify the pt at that time to advise it is time for him to re-apply with them for his Brilinta. Pt ID: [id number]  The letter also states that they have notified the pt of this approval as well.

## 2021-02-12 ENCOUNTER — Telehealth (HOSPITAL_COMMUNITY): Payer: Self-pay

## 2021-02-12 NOTE — Telephone Encounter (Signed)
Called to confirm Heart & Vascular Transitions of Care appointment for 6/28 at 3pm. Confirmed patient has transportation with SNF.  Confirmed appointment prior to ending call.

## 2021-02-12 NOTE — Progress Notes (Signed)
[facility] Trans[facility]  PCP: Thersa Salt Primary Cardiologist: Larae Grooms  HPI:  [name] is a 50 y.o.  male  with a PMH significant for CAD, s/p PCI to RCA, HTN, T2DM, HLD  01/19/21 Had a syncopal event, ems was called.  On arrival EKG was concerning for ST elevation.  He was brought emergently to [facility].  Upon arrival, he was noted to be bradycardic to the 40s with transient losses of consciousness.  He was taken emergently for cardiac catheterization where he was found to have 2 acute lesions: Late in-stent thrombosis of the RCA and acute occlusion of the left circumflex.  He received aspiration thrombectomy and balloon angioplasty of the RCA and PCI to the left circumflex.  He had atrial fibrillation with RVR during the case and was treated with low-dose metoprolol as well as IV amiodarone.  Due to hypotension, and intra-aortic balloon pump was placed.  He was supported by a transvenous pacemaker during the procedure.  After revascularization, his heart rate improved and the transvenous pacemaker was removed.  Shortly after patient's BP also began to improve, balloon pump was removed.  He had an AKI initially that improved back to his baseline normal Cr.  He had an ECHO 01/20/2021 with EF 40%, RWMA, mildly reduced RV function.  He was started on GDMT.  Weight was stable at 225lbs, never required diuresis.    Heart wise since discharge says he's doing great, no dyspnea, chest pain, orthopnea or PND.  Denies any light headedness or dizziness.  Struggling with cervical spinal stenosis symptoms currently.      ROS: All systems negative except as listed in HPI, PMH and Problem List.  SH:  Social History   Socioeconomic History   Marital status: Single    Spouse name: Not on file   Number of children: Not on file   Years of education: Not on file   Highest education level: Not on file  Occupational History   Not on file   Tobacco Use   Smoking status: Every Day    Packs/day: 1.00    Pack years: 0.00    Types: Cigarettes   Smokeless tobacco: Never  Substance and Sexual Activity   Alcohol use: Yes    Alcohol/week: 0.0 - 1.0 standard drinks   Drug use: No   Sexual activity: Not on file  Other Topics Concern   Not on file  Social History Narrative   Not on file   Social Determinants of Health   Financial Resource Strain: Not on file  Food Insecurity: Not on file  Transportation Needs: Not on file  Physical Activity: Not on file  Stress: Not on file  Social Connections: Not on file  Intimate Partner Violence: Not on file    FH:  Family History  Problem Relation Age of Onset   Diabetes Father    Heart disease Father    Breast cancer Mother    Lung cancer Paternal Grandmother     Past Medical History:  Diagnosis Date   Coronary artery disease    Diabetes mellitus without complication ([place])    Heart disease    Myocardial infarction [facility])     Current Outpatient Medications  Medication Sig Dispense Refill   acetaminophen (TYLENOL) 325 MG tablet Take 2 tablets (650 mg total) by mouth every 4 (four) hours as needed for mild pain, fever or headache.     albuterol (VENTOLIN HFA) 108 (90 Base) MCG/ACT  inhaler Inhale 2 puffs into the lungs every 6 (six) hours as needed for wheezing or shortness of breath. 8 g 2   aspirin EC 81 MG EC tablet Take 1 tablet (81 mg total) by mouth daily. Swallow whole. 30 tablet 11   atorvastatin (LIPITOR) 80 MG tablet Take 1 tablet (80 mg total) by mouth daily.     Baclofen 5 MG TABS Take 5 mg by mouth 3 (three) times daily. 30 tablet 0   carvedilol (COREG) 6.25 MG tablet Take 1 tablet (6.25 mg total) by mouth 2 (two) times daily with a meal.     dapagliflozin propanediol (FARXIGA) 10 MG TABS tablet Take 1 tablet (10 mg total) by mouth daily. 30 tablet    fluticasone (FLONASE) 50 MCG/ACT nasal spray Place 2 sprays into both nostrils daily. 9.9 mL 2   glipiZIDE  (GLUCOTROL) 10 MG tablet Take 10 mg by mouth daily.     HYDROcodone-acetaminophen (NORCO/VICODIN) 5-325 MG tablet Take 1 tablet by mouth every 6 (six) hours as needed for severe pain. 30 tablet 0   insulin aspart (NOVOLOG) 100 UNIT/ML injection Inject 0-15 Units into the skin 3 (three) times daily with meals. Do NOT hold if patient is NPO. Moderate Scale. 10 mL 11   losartan (COZAAR) 25 MG tablet Take 0.5 tablets (12.5 mg total) by mouth daily.     melatonin 3 MG TABS tablet Take 1 tablet (3 mg total) by mouth at bedtime.  0   metFORMIN (GLUCOPHAGE) 500 MG tablet TAKE ONE TABLET BY MOUTH TWICE DAILY WITH A MEAL 30 tablet 0   nitroGLYCERIN (NITROSTAT) 0.4 MG SL tablet Place 1 tablet (0.4 mg total) under the tongue every 5 (five) minutes x 3 doses as needed for chest pain.  12   polyethylene glycol (MIRALAX / GLYCOLAX) 17 g packet Take 17 g by mouth daily as needed for moderate constipation or severe constipation. 14 each 0   pregabalin (LYRICA) 100 MG capsule Take 1 capsule (100 mg total) by mouth 3 (three) times daily. 60 capsule 0   senna-docusate (SENOKOT-S) 8.6-50 MG tablet Take 2 tablets by mouth at bedtime as needed for mild constipation.     ticagrelor (BRILINTA) 90 MG TABS tablet Take 1 tablet (90 mg total) by mouth 2 (two) times daily. 60 tablet 12   No current facility-administered medications for this encounter.    Vitals:   02/13/21 1507  BP: (!) 90/56  Pulse: 79  SpO2: 98%    PHYSICAL EXAM: Cardiac: JVD flat, normal rate and rhythm, clear s1 and s2, no murmurs, rubs or gallops, no LE edema Pulmonary: CTAB, not in distress Abdominal: non distended abdomen, soft and nontender Psych: Alert, conversant, in good spirits    ECG   NSR rate 79, old inferior/posterior infarct   ASSESSMENT & PLAN:  Chronic Systolic CHF:  -ischemic cardiomyopathy, 2 acute vessel occlusions in the RCA and circumflex.  Underwent aspiration thrombectomy which was followed by balloon angioplasty  with TIMI-3 flow restored.  PCI/DES x1 placed to mid left circumflex. -Echo on 6/4 noted EF of 40% with hypokinesis of the LV, entire inferior wall as well as lateral wall involvement. mildly reduced RV function -NYHA Class II symptoms, euvolemic on exam -Continue Coreg 6.25 mg twice daily, farxiga 10 -Switch to losartan 12.46m QHS, bp low today but asymptomatic, facility bp's reviewed and are low but stay between 1937-342systolic.  No room for further GDMT titration. -repeat labs today   CAD:  -Inferior STEMI in the  setting of 2 weeks of stopping clopidogrel.  Two-vessel thrombotic occlusion.  This appeared to be 2 acute vessel occlusions in the RCA and circumflex.  Cath showed late stent thrombosis of the RCA stent.  Underwent aspiration thrombectomy which was followed by balloon angioplasty with TIMI-3 flow restored.  PCI/DES x1 placed to mid left circumflex.  Placed on dual antiplatelet therapy with aspirin and Brilinta for at least 1 year. -Continue DAPT/statin and GDMT above, recommendations are for at least one year DAPT but likely longer   Paroxysmal atrial fibrillation:  -peri MI afib,  No recurrence during recent admission.   -30-day monitor ordered during inpatient stay assess for recurrent atrial fibrillation. -remains in NSR today -hold off on [facility] for now   Third-degree AV block:  -Transient, occurred in setting of inferior MI, resolved after revascularization   -continue carvedilol   T2DM:  -Continue Farxiga     Follow up with [facility]

## 2021-02-13 ENCOUNTER — Ambulatory Visit (HOSPITAL_COMMUNITY)
Admit: 2021-02-13 | Discharge: 2021-02-13 | Disposition: A | Discharge status: Home / Self Care | Payer: Medicaid Other | Attending: Internal Medicine | Admitting: Internal Medicine

## 2021-02-13 ENCOUNTER — Encounter (HOSPITAL_COMMUNITY): Payer: Self-pay

## 2021-02-13 ENCOUNTER — Other Ambulatory Visit: Payer: Self-pay

## 2021-02-13 VITALS — BP 90/56 | HR 79

## 2021-02-13 DIAGNOSIS — I442 Atrioventricular block, complete: Secondary | ICD-10-CM | POA: Insufficient documentation

## 2021-02-13 DIAGNOSIS — Z7984 Long term (current) use of oral hypoglycemic drugs: Secondary | ICD-10-CM | POA: Diagnosis not present

## 2021-02-13 DIAGNOSIS — E1165 Type 2 diabetes mellitus with hyperglycemia: Secondary | ICD-10-CM

## 2021-02-13 DIAGNOSIS — I5022 Chronic systolic (congestive) heart failure: Secondary | ICD-10-CM | POA: Diagnosis present

## 2021-02-13 DIAGNOSIS — I110 Hypertensive heart disease with heart failure: Secondary | ICD-10-CM | POA: Diagnosis not present

## 2021-02-13 DIAGNOSIS — I480 Paroxysmal atrial fibrillation: Secondary | ICD-10-CM | POA: Insufficient documentation

## 2021-02-13 DIAGNOSIS — Z794 Long term (current) use of insulin: Secondary | ICD-10-CM | POA: Diagnosis not present

## 2021-02-13 DIAGNOSIS — Z7982 Long term (current) use of aspirin: Secondary | ICD-10-CM | POA: Diagnosis not present

## 2021-02-13 DIAGNOSIS — E785 Hyperlipidemia, unspecified: Secondary | ICD-10-CM | POA: Insufficient documentation

## 2021-02-13 DIAGNOSIS — Z79899 Other long term (current) drug therapy: Secondary | ICD-10-CM | POA: Diagnosis not present

## 2021-02-13 DIAGNOSIS — I2510 Atherosclerotic heart disease of native coronary artery without angina pectoris: Secondary | ICD-10-CM | POA: Diagnosis not present

## 2021-02-13 DIAGNOSIS — F17210 Nicotine dependence, cigarettes, uncomplicated: Secondary | ICD-10-CM | POA: Diagnosis not present

## 2021-02-13 DIAGNOSIS — I255 Ischemic cardiomyopathy: Secondary | ICD-10-CM | POA: Diagnosis not present

## 2021-02-13 DIAGNOSIS — Z955 Presence of coronary angioplasty implant and graft: Secondary | ICD-10-CM | POA: Diagnosis not present

## 2021-02-13 DIAGNOSIS — I252 Old myocardial infarction: Secondary | ICD-10-CM | POA: Insufficient documentation

## 2021-02-13 DIAGNOSIS — E119 Type 2 diabetes mellitus without complications: Secondary | ICD-10-CM | POA: Diagnosis not present

## 2021-02-13 DIAGNOSIS — E1159 Type 2 diabetes mellitus with other circulatory complications: Secondary | ICD-10-CM

## 2021-02-13 DIAGNOSIS — Z8249 Family history of ischemic heart disease and other diseases of the circulatory system: Secondary | ICD-10-CM | POA: Insufficient documentation

## 2021-02-13 DIAGNOSIS — IMO0002 Reserved for concepts with insufficient information to code with codable children: Secondary | ICD-10-CM

## 2021-02-13 DIAGNOSIS — Z7902 Long term (current) use of antithrombotics/antiplatelets: Secondary | ICD-10-CM | POA: Diagnosis not present

## 2021-02-13 LAB — BASIC METABOLIC PANEL
Anion gap: 10 (ref 5–15)
BUN: 19 mg/dL (ref 6–20)
CO2: 25 mmol/L (ref 22–32)
Calcium: 9.2 mg/dL (ref 8.9–10.3)
Chloride: 103 mmol/L (ref 98–111)
Creatinine, Ser: 0.91 mg/dL (ref 0.61–1.24)
GFR, Estimated: >60 mL/min (ref >60)
Glucose, Bld: 85 mg/dL (ref 70–99)
Potassium: 3.7 mmol/L (ref 3.5–5.1)
Sodium: 138 mmol/L (ref 135–145)

## 2021-02-13 MED ORDER — LOSARTAN POTASSIUM 25 MG PO TABS: 12.5000 mg | ORAL_TABLET | Freq: Every day | ORAL | Status: DC

## 2021-02-13 NOTE — Patient Instructions (Signed)
Take Losartan 12.5 mg every night  Lab work done today,we will call you with any abnormal results  You have been referred to [place] office will call you for an appointment.

## 2021-03-09 ENCOUNTER — Inpatient Hospital Stay: Payer: Self-pay | Admitting: Nurse Practitioner

## 2021-03-13 ENCOUNTER — Other Ambulatory Visit: Payer: Self-pay | Admitting: Cardiology

## 2021-03-13 DIAGNOSIS — I480 Paroxysmal atrial fibrillation: Secondary | ICD-10-CM

## 2021-03-13 DIAGNOSIS — R002 Palpitations: Secondary | ICD-10-CM

## 2021-03-30 ENCOUNTER — Telehealth: Payer: Self-pay | Admitting: *Deleted

## 2021-03-30 NOTE — Telephone Encounter (Signed)
   [facility] Pre-operative Risk Assessment    Patient Name: [name]  DOB: 01-15-1971 MRN: [id number]  HEARTCARE STAFF:  - IMPORTANT!!!!!! Under Visit Info/Reason for Call, type in Other and utilize the format Clearance MM/DD/YY or Clearance TBD. Do not use dashes or single digits. - Please review there is not already an duplicate clearance open for this procedure. - If request is for dental extraction, please clarify the # of teeth to be extracted. - If the patient is currently at the dentist's office, call Pre-Op Callback Staff (MA/nurse) to input urgent request.  - If the patient is not currently in the dentist office, please route to the Pre-Op pool.  Request for surgical clearance: PT HAS NEW PT APPT WITH DR. END 04/11/21  What type of surgery is being performed? C4-5, C5-6, C6-7, C7-T1 ANTERIOR CERVICAL FUSION  When is this surgery scheduled? TBD  What type of clearance is required (medical clearance vs. Pharmacy clearance to hold med vs. Both)? BOTH  Are there any medications that need to be held prior to surgery and how long?  ASA AND [place] name and name of physician performing surgery? [facility]; DR. Emelda Brothers  What is the office phone number? [phone]   7.   What is the office fax number? [place]: VANESSA EXT [phone]  7.   Anesthesia type (None, local, MAC, general) ? GENERAL   Julaine Hua 03/30/2021, 9:27 AM  _________________________________________________________________   (provider comments below)

## 2021-04-09 ENCOUNTER — Other Ambulatory Visit: Payer: Self-pay | Admitting: Neurological Surgery

## 2021-04-11 ENCOUNTER — Ambulatory Visit (INDEPENDENT_AMBULATORY_CARE_PROVIDER_SITE_OTHER): Payer: Self-pay | Admitting: Internal Medicine

## 2021-04-11 ENCOUNTER — Encounter: Payer: Self-pay | Admitting: Internal Medicine

## 2021-04-11 ENCOUNTER — Other Ambulatory Visit: Payer: Self-pay

## 2021-04-11 VITALS — BP 100/60 | HR 76 | Ht 74.0 in | Wt 209.0 lb

## 2021-04-11 DIAGNOSIS — I255 Ischemic cardiomyopathy: Secondary | ICD-10-CM | POA: Insufficient documentation

## 2021-04-11 DIAGNOSIS — M4712 Other spondylosis with myelopathy, cervical region: Secondary | ICD-10-CM

## 2021-04-11 DIAGNOSIS — I5022 Chronic systolic (congestive) heart failure: Secondary | ICD-10-CM

## 2021-04-11 DIAGNOSIS — E1169 Type 2 diabetes mellitus with other specified complication: Secondary | ICD-10-CM

## 2021-04-11 DIAGNOSIS — Z01810 Encounter for preprocedural cardiovascular examination: Secondary | ICD-10-CM

## 2021-04-11 DIAGNOSIS — I2510 Atherosclerotic heart disease of native coronary artery without angina pectoris: Secondary | ICD-10-CM

## 2021-04-11 DIAGNOSIS — Z79899 Other long term (current) drug therapy: Secondary | ICD-10-CM

## 2021-04-11 DIAGNOSIS — E785 Hyperlipidemia, unspecified: Secondary | ICD-10-CM

## 2021-04-11 NOTE — Progress Notes (Signed)
Follow-up Outpatient Visit Date: 04/11/2021  Primary Care Provider: Ronald Pippins [address] [place] [address]  Chief Complaint: Follow-up coronary artery disease and cardiomyopathy  HPI:  [name] is a 50 y.o. male with history of coronary artery disease status post multiple PCI's with STEMI in 01/2021 due to simultaneous late stent thrombosis in the mid RCA as well as occlusion of the LCx complicated by cardiogenic shock, HFrEF due to ischemic cardiomyopathy, right lower extremity amputation, hypertension, hyperlipidemia, diabetes mellitus, and cervical myelopathy, who presents for follow-up of coronary artery disease.  He presented to [facility] on 01/20/2021 after a syncopal episode.  When he was found by EMS, EKG showed ST elevation.  He underwent aspiration thrombectomy and PTCA of the mid RCA as well as stenting of the mid LCx.  He had transient complete heart block in the setting of his MI, which resolved with revascularization.  LVEF was 40% by echo.  He also has transient atrial fibrillation with rapid ventricular response during the PCI.  He was noted to have bilateral hand weakness during his hospitalization with concern for worsening cervical myelopathy.  Today, [name] reports that he is doing well from a heart standpoint, denying chest pain, shortness of breath, palpitations, lightheadedness, and further syncope.  He notes some dependent edema late in the day.  He has experienced progressive weakness and numbness in his upper and lower extremities to the point where he is mostly wheelchair/bedbound.  He has not had any bleeding and remains compliant with his medications.  --------------------------------------------------------------------------------------------------  Past Medical History:  Diagnosis Date   Coronary artery disease    Diabetes mellitus without complication ([place])    Heart disease    Myocardial infarction [facility])    Past Surgical History:   Procedure Laterality Date   AMPUTATION Right 10/01/2020   Procedure: AMPUTATION 5th  RAY AND IRRIGATION AND DEBRIDEMENT;  Surgeon: Samara Deist, DPM;  Location: [facility] ORS;  Service: Podiatry;  Laterality: Right;   AMPUTATION Right 10/11/2020   Procedure: AMPUTATION RAY;  Surgeon: Caroline More, DPM;  Location: [facility] ORS;  Service: Podiatry;  Laterality: Right;   BACK SURGERY     CARDIAC CATHETERIZATION     CORONARY THROMBECTOMY N/A 01/19/2021   Procedure: Coronary Thrombectomy;  Surgeon: Jettie Booze, MD;  Location: [place] [facility];  Service: Cardiovascular;  Laterality: N/A;   CORONARY/GRAFT ACUTE MI REVASCULARIZATION N/A 01/19/2021   Procedure: Coronary/Graft Acute MI Revascularization;  Surgeon: Jettie Booze, MD;  Location: [place] [facility];  Service: Cardiovascular;  Laterality: N/A;   IABP INSERTION N/A 01/19/2021   Procedure: IABP Insertion;  Surgeon: Jettie Booze, MD;  Location: [place] [facility];  Service: Cardiovascular;  Laterality: N/A;   INCISION AND DRAINAGE Right 09/29/2020   Procedure: INCISION AND DRAINAGE, RIGHT FOOT;  Surgeon: Samara Deist, DPM;  Location: [facility] ORS;  Service: Podiatry;  Laterality: Right;   LEFT HEART CATH AND CORONARY ANGIOGRAPHY N/A 01/19/2021   Procedure: LEFT HEART CATH AND CORONARY ANGIOGRAPHY;  Surgeon: Jettie Booze, MD;  Location: [place] [facility];  Service: Cardiovascular;  Laterality: N/A;   LOWER EXTREMITY ANGIOGRAPHY Right 10/03/2020   Procedure: Lower Extremity Angiography;  Surgeon: Katha Cabal, MD;  Location: [place] [facility];  Service: Cardiovascular;  Laterality: Right;   TENDON LENGTHENING Right 10/11/2020   Procedure: TENDON LENGTHENING;  Surgeon: Caroline More, DPM;  Location: [facility] ORS;  Service: Podiatry;  Laterality: Right;     Current Meds  Medication Sig   acetaminophen (TYLENOL) 325 MG tablet Take 2 tablets (650 mg total) by mouth every 4 (four) hours as needed for mild  pain, fever or headache.   albuterol (VENTOLIN HFA) 108 (90 Base) MCG/ACT inhaler Inhale 2 puffs into the lungs every 6 (six) hours as needed for wheezing or shortness of breath.   aspirin EC 81 MG EC tablet Take 1 tablet (81 mg total) by mouth daily. Swallow whole.   atorvastatin (LIPITOR) 80 MG tablet Take 1 tablet (80 mg total) by mouth daily.   Baclofen 5 MG TABS Take 5 mg by mouth 3 (three) times daily.   Bismuth Subsalicylate (PEPTO-BISMOL PO) Take by mouth as needed.   carvedilol (COREG) 6.25 MG tablet Take 1 tablet (6.25 mg total) by mouth 2 (two) times daily with a meal.   empagliflozin (JARDIANCE) 10 MG TABS tablet Take by mouth daily.   fluticasone (FLONASE) 50 MCG/ACT nasal spray Place 2 sprays into both nostrils daily.   glipiZIDE (GLUCOTROL) 10 MG tablet Take 10 mg by mouth daily.   HYDROcodone-acetaminophen (NORCO/VICODIN) 5-325 MG tablet Take 1 tablet by mouth every 6 (six) hours as needed for severe pain.   insulin aspart (NOVOLOG) 100 UNIT/ML injection Inject 0-15 Units into the skin 3 (three) times daily with meals. Do NOT hold if patient is NPO. Moderate Scale.   loperamide (IMODIUM) 2 MG capsule Take by mouth as needed for diarrhea or loose stools.   losartan (COZAAR) 25 MG tablet Take 0.5 tablets (12.5 mg total) by mouth at bedtime.   melatonin 3 MG TABS tablet Take 1 tablet (3 mg total) by mouth at bedtime.   metFORMIN (GLUCOPHAGE) 500 MG tablet TAKE ONE TABLET BY MOUTH TWICE DAILY WITH A MEAL   nitroGLYCERIN (NITROSTAT) 0.4 MG SL tablet Place 1 tablet (0.4 mg total) under the tongue every 5 (five) minutes x 3 doses as needed for chest pain.   polyethylene glycol (MIRALAX / GLYCOLAX) 17 g packet Take 17 g by mouth daily as needed for moderate constipation or severe constipation.   pregabalin (LYRICA) 100 MG capsule Take 1 capsule (100 mg total) by mouth 3 (three) times daily.   senna-docusate (SENOKOT-S) 8.6-50 MG tablet Take 2 tablets by mouth at bedtime as needed for mild  constipation.   ticagrelor (BRILINTA) 90 MG TABS tablet Take 1 tablet (90 mg total) by mouth 2 (two) times daily.    Allergies: Patient has no known allergies.  Social History   Tobacco Use   Smoking status: Every Day    Packs/day: 1.00    Years: 33.00    Pack years: 33.00    Types: Cigarettes   Smokeless tobacco: Never  Substance Use Topics   Alcohol use: Not Currently    Alcohol/week: 0.0 - 1.0 standard drinks   Drug use: No    Family History  Problem Relation Age of Onset   Breast cancer Mother    Heart attack Father    Diabetes Father    Heart disease Father    Lung cancer Paternal Grandmother     Review of Systems: A 12-system review of systems was performed and was negative except as noted in the HPI.  --------------------------------------------------------------------------------------------------  Physical Exam: BP 100/60 (BP Location: Right Arm, Patient Position: Sitting, Cuff Size: Normal)   Pulse 76   Ht 6' 2"  (1.88 m)   Wt 209 lb (94.8 kg)   SpO2 98%   BMI 26.83 kg/m   General:  NAD.  Seated in a wheelchair.  Neck: No JVD or HJR. Lungs: Clear to auscultation bilaterally without wheezes or crackles. Heart: Regular rate and rhythm without murmurs, rubs, or gallops. Abdomen: Soft, nontender, nondistended. Extremities: No lower extremity edema.  EKG: Normal sinus rhythm with left axis deviation.  Compared with prior tracing from 02/13/2021, inferior T wave inversions are no longer present.  Lab Results  Component Value Date   WBC 8.7 01/25/2021   HGB 13.4 01/25/2021   HCT 39.2 01/25/2021   MCV 92.0 01/25/2021   PLT 195 01/25/2021    Lab Results  Component Value Date   NA 138 02/13/2021   K 3.7 02/13/2021   CL 103 02/13/2021   CO2 25 02/13/2021   BUN 19 02/13/2021   CREATININE 0.91 02/13/2021   GLUCOSE 85 02/13/2021   ALT 34 01/19/2021    Lab Results  Component Value Date   CHOL 202 (H) 01/20/2021   HDL 42 01/20/2021   LDLCALC 136  (H) 01/20/2021   LDLDIRECT 132.0 12/05/2015   TRIG 120 01/20/2021   CHOLHDL 4.8 01/20/2021    --------------------------------------------------------------------------------------------------  ASSESSMENT AND PLAN: Coronary artery disease: [name] appears to be doing well from a heart standpoint following his MI and multivessel PCI complicated by cardiogenic shock and complete heart block in early June.  He has been compliant with medications including dual antiplatelet therapy with aspirin and ticagrelor.  I recommend that he continue DAPT for at least 12 months from his event, though interruption for cervical spine surgery in the interim will need to be considered (see details below).  We will continue current medications for secondary prevention including high intensity statin therapy.  Ischemic cardiomyopathy and chronic HFrEF: [name] appears euvolemic on examination.  He does not have any heart failure symptoms though his functional capacity is severely limited by his weakness due to cervical myelopathy.  I recommend continuing current regimen of empagliflozin, losartan, and carvedilol.  Soft blood pressure precludes escalation at this time.  We will obtain a limited echo to reassess his LVEF at his convenience (does not need to be completed before cervical spine surgery).  Hyperlipidemia associated with type 2 diabetes mellitus: [name] is tolerating atorvastatin 80 mg daily well.  We will obtain a lipid panel today to ensure appropriate response (goal LDL less than 70).  Ongoing management of diabetes mellitus per PCP.  Cervical myelopathy and preoperative cardiovascular risk assessment: [name] has experienced progressive and severe disability secondary to cervical myelopathy.  I do not think that it is reasonable to defer surgery for 12 months from the time of his STEMI in June.  As he appears to be stable from a heart standpoint, I would favor moving forward with  neurosurgical intervention after completion of 3 months of dual antiplatelet therapy (after 04/21/2021).  Ticagrelor should be held for 5 days before the surgery and restarted as soon as it is safe to do so from a postoperative standpoint.  Aspirin 81 mg daily should be continued in the perioperative period, if possible, to minimize the risk for stent thrombosis.  I have counseled [name] that he is at increased risk for perioperative cardiovascular complications.  However, I do not believe that any further cardiac intervention at this time will mitigate his perioperative risk.  If anything, delaying neurosurgical intervention may lead to more profound and permanent neurologic disability.  Follow-up: Return to clinic in 3 months.  Nelva Bush, MD 04/11/2021 8:54 AM

## 2021-04-11 NOTE — Patient Instructions (Signed)
Medication Instructions:   Your physician recommends that you continue on your current medications as directed. Please refer to the Current Medication list given to you today.  *If you need a refill on your cardiac medications before your next appointment, please call your pharmacy*   Lab Work:  TODAY: Lipid panel, ALT   If you have labs (blood work) drawn today and your tests are completely normal, you will receive your results only by: [facility] (if you have MyChart) OR A paper copy in the mail If you have any lab test that is abnormal or we need to change your treatment, we will call you to review the results.   Testing/Procedures:  Your physician has requested that you have an echocardiogram. Echocardiography is a painless test that uses sound waves to create images of your heart. It provides your doctor with information about the size and shape of your heart and how well your heart's chambers and valves are working. This procedure takes approximately one hour. There are no restrictions for this procedure.   Follow-Up: At [facility], you and your health needs are our priority.  As part of our continuing mission to provide you with exceptional heart care, we have created designated Provider Care Teams.  These Care Teams include your primary Cardiologist (physician) and Advanced Practice Providers (APPs -  Physician Assistants and Nurse Practitioners) who all work together to provide you with the care you need, when you need it.  We recommend signing up for the patient portal called "MyChart".  Sign up information is provided on this After Visit Summary.  MyChart is used to connect with patients for Virtual Visits (Telemedicine).  Patients are able to view lab/test results, encounter notes, upcoming appointments, etc.  Non-urgent messages can be sent to your provider as well.   To learn more about what you can do with MyChart, go to [email or web address].    Your next  appointment:   3 month(s)  The format for your next appointment:   In Person  Provider:   You may see Dr. Saunders Revel or one of the following Advanced Practice Providers on your designated Care Team:   Murray Hodgkins, NP Christell Faith, PA-C Marrianne Mood, PA-C Cadence [place], [place]

## 2021-04-12 LAB — LIPID PANEL
Chol/HDL Ratio: 3.5 ratio (ref 0.0–5.0)
Cholesterol, Total: 119 mg/dL (ref 100–199)
HDL: 34 mg/dL — ABNORMAL LOW (ref >39)
LDL Chol Calc (NIH): 62 mg/dL (ref 0–99)
Triglycerides: 131 mg/dL (ref 0–149)
VLDL Cholesterol Cal: 23 mg/dL (ref 5–40)

## 2021-04-12 LAB — ALT: ALT: 27 IU/L (ref 0–44)

## 2021-04-13 ENCOUNTER — Other Ambulatory Visit (HOSPITAL_COMMUNITY): Payer: Self-pay

## 2021-04-13 ENCOUNTER — Telehealth: Payer: Self-pay | Admitting: *Deleted

## 2021-04-13 NOTE — Telephone Encounter (Signed)
Attempted to call pt. No answer. Lmtcb.  

## 2021-04-13 NOTE — Telephone Encounter (Signed)
-----   Message from Nelva Bush, MD sent at 04/13/2021  2:02 PM EDT ----- Please let Mr. [name] know that his lipids appear to be fairly well controlled.  His HDL (good cholesterol) is a little below our target, which will hopefully improve over time as he is able to increase his activity with treatment of his cervical spine disease.  LDL (bad cholesterol) is at goal.  Liver function is also normal.  Mr. [name] should continue his current medications and follow-up as previously arranged.

## 2021-04-20 NOTE — Telephone Encounter (Signed)
Attempted to call pt. No answer. Unable to leave vm at this time.

## 2021-04-24 ENCOUNTER — Encounter (HOSPITAL_COMMUNITY): Payer: Self-pay | Admitting: Neurological Surgery

## 2021-04-24 ENCOUNTER — Other Ambulatory Visit: Payer: Self-pay

## 2021-04-24 NOTE — Progress Notes (Signed)
Surgical Instructions    Your procedure is scheduled on [id number].  Report to [facility] Main Entrance "A" at [place].M., then check in with the Admitting office.  Call this number if you have problems the morning of surgery:  [phone]   If you have any questions prior to your surgery date call [phone]: Open Monday-Friday 8am-4pm    Remember:  Do not eat after midnight the night before your surgery  You may drink clear liquids until 0915 the morning of your surgery.   Clear liquids allowed are: Water, Non-Citrus Juices (without pulp), Carbonated Beverages, Clear Tea, Black Coffee ONLY (NO MILK, CREAM OR POWDERED CREAMER of any kind), and Gatorade    Take these medicines the morning of surgery with A SIP OF WATER  Atorvastatin, Baclofen, carvedilel, pregablin  as needed; tylenol, albuteral, hydrocodone, nitrostat, flonase.  On 04/24/2021 HOLD JARDIANCE  ON 04/25/2021 HOLD JARDIANCE, GLUCOTROL, AND METFORMIN  ON THE MORNING OF SURGERY 04/25/2021 DO NOT TAKE YOUR NOVOLOG INSULIN UNLESS YOUR BLOOD SUGAR IS GREATER THAN 220  THAN TAKE HALF YOUR USUAL DOSE.  As soon as you get up in the morning check your blood sugar if it is less than 70 drink a half a cup of clear juice(apple juice or cranberry juice) recheck bloodsugar in 15 minutes if still less than 70 call [phone] ask for a nurse.  Check blood sugars every 2 hours after awakening.    As of today, STOP taking any Aspirin (unless otherwise instructed by your surgeon) Aleve, Naproxen, Ibuprofen, Motrin, Advil, Goody's, BC's, all herbal medications, fish oil, and all vitamins.          Do not wear jewelry or makeup Do not wear lotions, powders, perfumes/colognes, or deodorant. Do not shave 48 hours prior to surgery.  Men may shave face and neck. Do not bring valuables to the hospital. DO Not wear nail polish, gel polish, artificial nails, or any other type of covering on  natural nails including finger and toenails. If  patients have artificial nails, gel coating, etc. that need to be removed by a nail salon please have this removed prior to surgery or surgery may need to be canceled/delayed if the surgeon/ anesthesia feels like the patient is unable to be adequately monitored.             [facility] is not responsible for any belongings or valuables.  Do NOT Smoke (Tobacco/Vaping)  24 hours prior to your procedure If you use a CPAP at night, you may bring all equipment for your overnight stay.   Contacts, glasses, dentures or bridgework may not be worn into surgery, please bring cases for these belongings   For patients admitted to the hospital, discharge time will be determined by your treatment team.   Patients discharged the day of surgery will not be allowed to drive home, and someone needs to stay with them for 24 hours.  ONLY 1 SUPPORT PERSON MAY BE PRESENT WHILE YOU ARE IN SURGERY. IF YOU ARE TO BE ADMITTED ONCE YOU ARE IN YOUR ROOM YOU WILL BE ALLOWED TWO (2) VISITORS.  Minor children may have two parents present. Special consideration for safety and communication needs will be reviewed on a case by case basis.  Special instructions:    Oral Hygiene is also important to reduce your risk of infection.  Remember - BRUSH YOUR TEETH THE MORNING OF SURGERY WITH YOUR REGULAR TOOTHPASTE Remember to brush your teeth WITH YOUR REGULAR TOOTHPASTE.

## 2021-04-24 NOTE — Progress Notes (Signed)
Per [facility] anesthesia protocol to be used for insulin for this pt

## 2021-04-24 NOTE — Progress Notes (Signed)
Anesthesia Chart Review:  Case: [id number] Date/Time: 04/25/21 1200   Procedure: Cervical 4-5 Cervical 5-6 Cervical 6-7 Cervical 7 Thoracic 1 Anterior cervical decompression/Discectomy/fusion - 3C/RM 19   Anesthesia type: General   Pre-op diagnosis: Cervical myelopathy   Location: [facility] OR ROOM 84 / [place] OR   Surgeons: Judith Part, MD       DISCUSSION: Patient is a 50 year old male scheduled for the above procedure.   History includes smoking, CAD (inferior STEMI, s/p RCA stent ~ 11/2014; inferior STEMI with CHB, s/p TVP, emergent LHC with aspiration thrombectomy and balloon angioplasty for mid RCA for late stent thrombosis & DES 100% midCX, IABP 01/19/21), ischemic cardiomyopathy (09/353), chronic systolic CHF (04/7415), DM2, spinal surgery (L2-S1 laminectomies, left L4-5 disk excision 02/24/07), right foot infection (s/p partial 5th ray amputation 10/01/20; s/p right transmetatarrsal amputation, right tendo Achilles lengthening, rotational skin flap closure 10/11/20),  NASH Cirrhosis  - [place] admission 01/19/21-02/05/21. He initially presented to [facility] with syncope (while evaluating a mechanical issues with his truck) and bradycardia/CHB with HR 40's and concern for inferior STEMI. Dr. Irish Lack consulted and TVP placed and emergently taken to the cath lab and noted to have late in-stent occlusion of mid RCA and acute occlusion of mid CX. (He had been off anticoagulation therapy since 09/2020 following right TMA.)  He underwent aspiration thrombectomy and balloon angioplasty of RCA and DES to LCX. (Dr. Irish Lack opted not to stent the RCA given the CX was still occluded and patient remained hypotensive.) He had transient afib with RVR during the case that converted to SR after treated with metoprolol and IV amiodarone. HR stabilized after PCI and TVP removed, but due to hypotension and concern for cardiogenic shock, an IABP was placed and transferred to [facility]. CHB thought due to acute STEMI, and resolved after  PCI, so b-blocker started with plan for 30 day monitor to evaluate for further bradycardia or afib. IABP weaned as well. Started on ARB, but BP too soft to transition to [employer]. A1c 10.4 and Farxiga and SSI insulin added to metformin/glipizide regimen. Neurology [place], Harriet Pho, MD) consulted 01/21/21 due to bilateral hand weakness. Brain MRI showed 3 punctate foci of acute ischemia within the left parietal and occipital lobes and left cerebellum with normal brain MRA. The punctate strokes were too small to explain the patient's symptoms and did not have significant watershed infarct. Cervical spine imaging ordered and revealed significant c-spine stenosis and foraminal narrowing. Neurosurgery contacted. Patient may require surgery in the future but timing complicated by recent DES on DAPT. Discharged to SNF with out-patient follow-up.   Patient had recent cardiology evaluation by Nelva Bush, MD on 04/11/21.  He was doing well from a cardiac standpoint; however, he had been experiencing progressive weakness and numbness in his upper and lower extremities to the point where he was mostly wheelchair/bedbound.  Although continuation of DAPT recommended for least 12 months, he felt interruption for cervical spine surgery in the interim would have to be considered given progressive cervical myelopathic symptoms.  He wrote "Mr. Russomanno has experienced progressive and severe disability secondary to cervical myelopathy.  I do not think that it is reasonable to defer surgery for 12 months from the time of his STEMI in June.  As he appears to be stable from a heart standpoint, I would favor moving forward with neurosurgical intervention after completion of 3 months of dual antiplatelet therapy (after 04/21/2021).  Ticagrelor should be held for 5 days before the surgery and restarted  as soon as it is safe to do so from a postoperative standpoint.  Aspirin 81 mg daily should be continued in the perioperative period, if  possible, to minimize the risk for stent thrombosis.  I have counseled Mr. Weingartner that he is at increased risk for perioperative cardiovascular complications.  However, I do not believe that any further cardiac intervention at this time will mitigate his perioperative risk.  If anything, delaying neurosurgical intervention may lead to more profound and permanent neurologic disability." Per Janett Billow at Dr. Colleen Can office, ASA and Brilinta to be held for 5 days. Discussed with her and sent message to Dr. Zada Finders regarding cardiology recommendations resume asap when safe to do so postoperatively.   He is a same day work-up, so anesthesia team to evaluate on the day of surgery. Labs on arrival as indicated.   VS:  BP Readings from Last 3 Encounters:  04/11/21 100/60  02/13/21 (!) 90/56  02/05/21 (!) 109/58   Pulse Readings from Last 3 Encounters:  04/11/21 76  02/13/21 79  02/05/21 79     PROVIDERS: Coral Spikes, DO is PCP  End, Harrell Gave, MD is cardiologist   LABS: For day of surgery as indicated. As of 02/13/21, his last BMET and CBC were normal. AST and ALT normal on 01/19/21. A1c  10.4% on 01/22/21.   IMAGES: MRI c-spine 01/23/21:  IMPRESSION: 1. 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 2. Patchy foci of cord signal abnormality involving the cord at C4-5, C5-6, and likely C7-T1, consistent with chronic myelomalacia. Appearance is most pronounced at the C4-5 level. 3. Multifactorial degenerative changes with resultant multilevel foraminal narrowing as above. Notable findings include moderate bilateral C4 foraminal stenosis, severe bilateral C5 and C6 foraminal narrowing, with severe left and moderate right C7 foraminal stenosis.  CT c-spine 01/22/21:  IMPRESSION: 1. Normal alignment and no acute bony findings. 2. Multilevel disc disease as detailed above. 3. Moderate to large broad-based disc protrusion  asymmetric left at C5-6 with mass effect on the ventral thecal sac and obliteration of the ventral CSF space. 4. Moderate spinal and bilateral foraminal stenosis at C4-5. 5. Mild spinal and bilateral foraminal stenosis at C3-4. 6. Shallow broad-based disc protrusion asymmetric left at C6-7.   MRI/MRI Head 01/21/21: IMPRESSION: 1. Motion degraded examination. 2. 3 punctate foci of acute ischemia, within the left parietal lobe, left occipital lobe and left cerebellum. No hemorrhage or mass effect. 3. Normal intracranial MRA.  CTA Chest 10/30/20: IMPRESSION: 1. No evidence of pulmonary embolism or other acute intrathoracic process. 2. Mild apical emphysematous change.  Emphysema (ICD10-J43.9).    EKG: 04/11/21: NSR, LAD. Compared to 02/13/21 tracing, inferior T wave inversion is no longer present.   CV: Echo 01/20/21: IMPRESSIONS   1. Left ventricular ejection fraction, by estimation, is 40%. The left  ventricle has moderately decreased function. The left ventricle  demonstrates regional wall motion abnormalities (see scoring  diagram/findings for description). Left ventricular  diastolic parameters are indeterminate. There is severe hypokinesis of the  left ventricular, entire inferior wall and inferolateral wall.   2. Right ventricular systolic function is mildly reduced. The right  ventricular size is normal. Tricuspid regurgitation signal is inadequate  for assessing PA pressure.   3. The mitral valve is normal in structure. No evidence of mitral valve  regurgitation.   4. The aortic valve is tricuspid. Aortic valve regurgitation is not  visualized. Mild aortic valve sclerosis is present, with  no evidence of  aortic valve stenosis.   5. The inferior vena cava is dilated in size with <50% respiratory  variability, suggesting right atrial pressure of 15 mmHg.    Emergency cardiac cath 01/19/21: Mid RCA lesion is 100% stenosed- very late stent thrombosis. Aspiration thrombectomy  was performed which restored TIMI-3 flow. Balloon angioplasty was performed using a BALLOON [place] [id number]. Post intervention, there is a 0% residual stenosis. Mid Cx lesion is 100% stenosed. A drug-eluting stent was successfully placed using a STENT RESOLUTE ONYX 2.5X15, postdilated to greater than 3 mm. Post intervention, there is a 0% residual stenosis. LV end diastolic pressure is mildly elevated. There is no aortic valve stenosis. Temporary pacemaker placement was used. Heart rate improved after PCI of the RCA. The temporary pacer was removed. Successful placement of the intra-aortic balloon pump.   - Dual antiplatelet therapy for 12 months along with aggressive secondary prevention.  Very late stent thrombosis of the RCA likely due to his antiplatelet therapy being stopped back in February.  Second acute occlusion of the circumflex.   - Transfer to [facility] given his intra-aortic balloon pump.   Aortogram with RLE runoff 10/03/20: Summary: There is mild atherosclerotic disease diffusely but there are no hemodynamically significant stenoses identified.  Wound healing should be quite good he has three-vessel runoff to the foot and filling of the pedal arch via the plantar as well as dorsalis pedis vessels    Past Medical History:  Diagnosis Date   CHF (congestive heart failure) (HCC)    Coronary artery disease    Diabetes mellitus without complication ([place])    Ischemic cardiomyopathy    Myocardial infarction ([place])    inferior STEMI ~ 2016; 01/19/21 with CHB/cardiogenic shock requiring brief TVP, IABP, s/p aspiration thrombectomy/ballon angioplasty for late thrombosis mRCA stent & DES for acute occlusion mLCX    Past Surgical History:  Procedure Laterality Date   AMPUTATION Right 10/01/2020   Procedure: AMPUTATION 5th  RAY AND IRRIGATION AND DEBRIDEMENT;  Surgeon: Samara Deist, DPM;  Location: [facility] ORS;  Service: Podiatry;  Laterality: Right;   AMPUTATION Right 10/11/2020    Procedure: AMPUTATION RAY;  Surgeon: Caroline More, DPM;  Location: [facility] ORS;  Service: Podiatry;  Laterality: Right;   BACK SURGERY     CARDIAC CATHETERIZATION     CORONARY THROMBECTOMY N/A 01/19/2021   Procedure: Coronary Thrombectomy;  Surgeon: Jettie Booze, MD;  Location: [place] [facility] LAB;  Service: Cardiovascular;  Laterality: N/A;   CORONARY/GRAFT ACUTE MI REVASCULARIZATION N/A 01/19/2021   Procedure: Coronary/Graft Acute MI Revascularization;  Surgeon: Jettie Booze, MD;  Location: [place] [facility] LAB;  Service: Cardiovascular;  Laterality: N/A;   IABP INSERTION N/A 01/19/2021   Procedure: IABP Insertion;  Surgeon: Jettie Booze, MD;  Location: [place] [facility] LAB;  Service: Cardiovascular;  Laterality: N/A;   INCISION AND DRAINAGE Right 09/29/2020   Procedure: INCISION AND DRAINAGE, RIGHT FOOT;  Surgeon: Samara Deist, DPM;  Location: [facility] ORS;  Service: Podiatry;  Laterality: Right;   LEFT HEART CATH AND CORONARY ANGIOGRAPHY N/A 01/19/2021   Procedure: LEFT HEART CATH AND CORONARY ANGIOGRAPHY;  Surgeon: Jettie Booze, MD;  Location: [place] [facility] LAB;  Service: Cardiovascular;  Laterality: N/A;   LOWER EXTREMITY ANGIOGRAPHY Right 10/03/2020   Procedure: Lower Extremity Angiography;  Surgeon: Katha Cabal, MD;  Location: [place] [facility] LAB;  Service: Cardiovascular;  Laterality: Right;   TENDON LENGTHENING Right 10/11/2020   Procedure: TENDON LENGTHENING;  Surgeon:  Caroline More, DPM;  Location: [facility] ORS;  Service: Podiatry;  Laterality: Right;    MEDICATIONS: No current facility-administered medications for this encounter.    acetaminophen (TYLENOL) 325 MG tablet   albuterol (VENTOLIN HFA) 108 (90 Base) MCG/ACT inhaler   aspirin EC 81 MG EC tablet   atorvastatin (LIPITOR) 80 MG tablet   Baclofen 5 MG TABS   bismuth subsalicylate (PEPTO BISMOL) 262 MG/15ML suspension   Bismuth Tribromoph-Petrolatum (XEROFORM PETROLATUM DRESSING EX)    carvedilol (COREG) 6.25 MG tablet   collagenase (SANTYL) ointment   Dextrose, Diabetic Use, (INSTA-GLUCOSE) 77.4 % GEL   empagliflozin (JARDIANCE) 10 MG TABS tablet   fluticasone (FLONASE) 50 MCG/ACT nasal spray   Glucagon, rDNA, (GLUCAGON EMERGENCY) 1 MG KIT   HYDROcodone-acetaminophen (NORCO/VICODIN) 5-325 MG tablet   insulin aspart (NOVOLOG) 100 UNIT/ML injection   loperamide (IMODIUM) 2 MG capsule   losartan (COZAAR) 25 MG tablet   melatonin 3 MG TABS tablet   Menthol, Topical Analgesic, (BIOFREEZE) 4 % GEL   metFORMIN (GLUCOPHAGE) 500 MG tablet   nitroGLYCERIN (NITROSTAT) 0.4 MG SL tablet   polyethylene glycol (MIRALAX / GLYCOLAX) 17 g packet   pregabalin (LYRICA) 100 MG capsule   senna-docusate (SENOKOT-S) 8.6-50 MG tablet   sodium chloride irrigation 0.9 % irrigation   glipiZIDE (GLUCOTROL) 10 MG tablet   ticagrelor (BRILINTA) 90 MG TABS tablet    Myra Gianotti, PA-C Surgical Short Stay/Anesthesiology [facility] Phone [phone] [facility] Phone [phone] 04/24/2021 4:00 PM

## 2021-04-24 NOTE — Anesthesia Preprocedure Evaluation (Addendum)
Anesthesia Evaluation  Patient identified by MRN, date of birth, ID band Patient awake    Reviewed: Allergy & Precautions, NPO status , Patient's Chart, lab work & pertinent test results  Airway Mallampati: II  TM Distance: >3 FB Neck ROM: Limited    Dental  (+) Dental Advisory Given, Edentulous Upper, Edentulous Lower   Pulmonary Current Smoker and Patient abstained from smoking.,    Pulmonary exam normal breath sounds clear to auscultation       Cardiovascular hypertension, Pt. on home beta blockers and Pt. on medications + CAD, + Past MI and +CHF  Normal cardiovascular exam Rhythm:Regular Rate:Normal  Echo 01/2021 1. Left ventricular ejection fraction, by estimation, is 40%. The left ventricle has moderately decreased function. The left ventricle demonstrates regional wall motion abnormalities (see scoring diagram/findings for description). Left ventricular diastolic parameters are indeterminate. There is severe hypokinesis of the  left ventricular, entire inferior wall and inferolateral wall.  2. Right ventricular systolic function is mildly reduced. The right ventricular size is normal. Tricuspid regurgitation signal is inadequate for assessing PA pressure.  3. The mitral valve is normal in structure. No evidence of mitral valve regurgitation.  4. The aortic valve is tricuspid. Aortic valve regurgitation is not visualized. Mild aortic valve sclerosis is present, with no evidence of aortic valve stenosis.  5. The inferior vena cava is dilated in size with <50% respiratory variability, suggesting right atrial pressure of 15 mmHg.    Cath 01/2021 -Mid RCA lesion is 100% stenosed- very late stent thrombosis. Aspiration thrombectomy was performed which restored TIMI-3 flow. -Balloon angioplasty was performed using a BALLOON [place] TREK RX 2.75X15. -Post intervention, there is a 0% residual stenosis. -Mid Cx lesion is 100% stenosed. -A  drug-eluting stent was successfully placed using a STENT RESOLUTE ONYX 2.5X15, postdilated to greater than 3 mm. -Post intervention, there is a 0% residual stenosis. LV end diastolic pressure is mildly elevated. -There is no aortic valve stenosis. -Temporary pacemaker placement was used. Heart rate improved after PCI of the RCA. The temporary pacer was removed. -Successful placement of the intra-aortic balloon pump.    Neuro/Psych negative neurological ROS     GI/Hepatic negative GI ROS, Neg liver ROS,   Endo/Other  negative endocrine ROSdiabetes  Renal/GU negative Renal ROS     Musculoskeletal  (+) Arthritis ,   Abdominal   Peds  Hematology negative hematology ROS (+)   Anesthesia Other Findings   Reproductive/Obstetrics                                                            Anesthesia Evaluation  Patient identified by MRN, date of birth, ID band Patient awake    Reviewed: Allergy & Precautions, H&P , NPO status , Patient's Chart, lab work & pertinent test results  History of Anesthesia Complications Negative for: history of anesthetic complications  Airway Mallampati: II  TM Distance: >3 FB Neck ROM: full  Mouth opening: Limited Mouth Opening Comment: Large beard  Dental  (+) Edentulous Upper, Edentulous Lower   Pulmonary neg sleep apnea, neg COPD, Current Smoker and Patient abstained from smoking.,    Pulmonary exam normal        Cardiovascular hypertension, (-) angina+ CAD and + Past MI  (-) Cardiac Stents Normal cardiovascular exam(-) dysrhythmias  Neuro/Psych negative neurological ROS  negative psych ROS   GI/Hepatic negative GI ROS, Neg liver ROS,   Endo/Other  diabetes, Poorly Controlled  Renal/GU negative Renal ROS  negative genitourinary   Musculoskeletal   Abdominal Normal abdominal exam  (+)   Peds negative pediatric ROS (+)  Hematology negative hematology ROS (+)   Anesthesia Other  Findings Past Medical History: No date: Diabetes mellitus without complication (HCC) No date: Heart disease No date: Myocardial infarction ([place])  Reproductive/Obstetrics negative OB ROS                             Anesthesia Physical  Anesthesia Plan  ASA: III  Anesthesia Plan: General   Post-op Pain Management:    Induction:   PONV Risk Score and Plan: Ondansetron, Dexamethasone, Midazolam and Treatment may vary due to age or medical condition  Airway Management Planned: LMA  Additional Equipment:   Intra-op Plan:   Post-operative Plan: Extubation in OR  Informed Consent: I have reviewed the patients History and Physical, chart, labs and discussed the procedure including the risks, benefits and alternatives for the proposed anesthesia with the patient or authorized representative who has indicated his/her understanding and acceptance.     Dental advisory given  Plan Discussed with: CRNA  Anesthesia Plan Comments:         Anesthesia Quick Evaluation  Anesthesia Physical Anesthesia Plan  ASA: 4  Anesthesia Plan: General   Post-op Pain Management:    Induction: Intravenous  PONV Risk Score and Plan: 2 and Ondansetron, Dexamethasone, Midazolam and Treatment may vary due to age or medical condition  Airway Management Planned: Oral ETT and Video Laryngoscope Planned  Additional Equipment: Arterial line  Intra-op Plan:   Post-operative Plan: Possible Post-op intubation/ventilation  Informed Consent: I have reviewed the patients History and Physical, chart, labs and discussed the procedure including the risks, benefits and alternatives for the proposed anesthesia with the patient or authorized representative who has indicated his/her understanding and acceptance.     Dental advisory given  Plan Discussed with: CRNA  Anesthesia Plan Comments: (2 x PIV  I discussed his elevated high risk from a cardiac standpoint. He  understands his risk and wishes to proceed.    See PAT note written 04/24/2021 by Myra Gianotti, PA-C. DES 01/19/21. Per cardiologist Dr. Saunders Revel, "[name] has experienced progressive and severe disability secondary to cervical myelopathy.  I do not think that it is reasonable to defer surgery for 12 months from the time of his STEMI in June.  As he appears to be stable from a heart standpoint, I would favor moving forward with neurosurgical intervention after completion of 3 months of dual antiplatelet therapy (after 04/21/2021).  Ticagrelor should be held for 5 days before the surgery and restarted as soon as it is safe to do so from a postoperative standpoint.  Aspirin 81 mg daily should be continued in the perioperative period, if possible, to minimize the risk for stent thrombosis.  I have counseled [name] that he is at increased risk for perioperative cardiovascular complications.  However, I do not believe that any further cardiac intervention at this time will mitigate his perioperative risk.  If anything, delaying neurosurgical intervention may lead to more profound and permanent neurologic disability."  )      Anesthesia Quick Evaluation

## 2021-04-25 ENCOUNTER — Inpatient Hospital Stay (HOSPITAL_COMMUNITY): Payer: Medicaid Other

## 2021-04-25 ENCOUNTER — Inpatient Hospital Stay (HOSPITAL_COMMUNITY): Payer: Medicaid Other | Admitting: Vascular Surgery

## 2021-04-25 ENCOUNTER — Encounter (HOSPITAL_COMMUNITY): Payer: Self-pay | Admitting: Neurological Surgery

## 2021-04-25 ENCOUNTER — Inpatient Hospital Stay (HOSPITAL_COMMUNITY)
Admission: RE | Admit: 2021-04-25 | Discharge: 2021-05-03 | DRG: 472 | Disposition: A | Discharge status: Skilled Nursing Facility | Payer: Medicaid Other | Source: Skilled Nursing Facility | Attending: Neurological Surgery | Admitting: Neurological Surgery

## 2021-04-25 ENCOUNTER — Encounter (HOSPITAL_COMMUNITY)
Admission: RE | Disposition: A | Discharge status: Skilled Nursing Facility | Payer: Self-pay | Source: Skilled Nursing Facility | Attending: Neurological Surgery

## 2021-04-25 DIAGNOSIS — Z20822 Contact with and (suspected) exposure to covid-19: Secondary | ICD-10-CM | POA: Diagnosis present

## 2021-04-25 DIAGNOSIS — I5022 Chronic systolic (congestive) heart failure: Secondary | ICD-10-CM | POA: Diagnosis present

## 2021-04-25 DIAGNOSIS — M4803 Spinal stenosis, cervicothoracic region: Secondary | ICD-10-CM | POA: Diagnosis present

## 2021-04-25 DIAGNOSIS — Z419 Encounter for procedure for purposes other than remedying health state, unspecified: Secondary | ICD-10-CM

## 2021-04-25 DIAGNOSIS — G959 Disease of spinal cord, unspecified: Secondary | ICD-10-CM | POA: Diagnosis present

## 2021-04-25 DIAGNOSIS — Z801 Family history of malignant neoplasm of trachea, bronchus and lung: Secondary | ICD-10-CM | POA: Diagnosis not present

## 2021-04-25 DIAGNOSIS — M542 Cervicalgia: Secondary | ICD-10-CM | POA: Diagnosis present

## 2021-04-25 DIAGNOSIS — K7460 Unspecified cirrhosis of liver: Secondary | ICD-10-CM | POA: Diagnosis present

## 2021-04-25 DIAGNOSIS — I252 Old myocardial infarction: Secondary | ICD-10-CM | POA: Diagnosis not present

## 2021-04-25 DIAGNOSIS — Z8249 Family history of ischemic heart disease and other diseases of the circulatory system: Secondary | ICD-10-CM | POA: Diagnosis not present

## 2021-04-25 DIAGNOSIS — G992 Myelopathy in diseases classified elsewhere: Secondary | ICD-10-CM | POA: Diagnosis present

## 2021-04-25 DIAGNOSIS — Z833 Family history of diabetes mellitus: Secondary | ICD-10-CM | POA: Diagnosis not present

## 2021-04-25 DIAGNOSIS — M4802 Spinal stenosis, cervical region: Principal | ICD-10-CM | POA: Diagnosis present

## 2021-04-25 DIAGNOSIS — E119 Type 2 diabetes mellitus without complications: Secondary | ICD-10-CM | POA: Diagnosis present

## 2021-04-25 DIAGNOSIS — Z993 Dependence on wheelchair: Secondary | ICD-10-CM

## 2021-04-25 DIAGNOSIS — F17210 Nicotine dependence, cigarettes, uncomplicated: Secondary | ICD-10-CM | POA: Diagnosis present

## 2021-04-25 DIAGNOSIS — R1310 Dysphagia, unspecified: Secondary | ICD-10-CM | POA: Diagnosis present

## 2021-04-25 DIAGNOSIS — I255 Ischemic cardiomyopathy: Secondary | ICD-10-CM | POA: Diagnosis present

## 2021-04-25 DIAGNOSIS — I110 Hypertensive heart disease with heart failure: Secondary | ICD-10-CM | POA: Diagnosis present

## 2021-04-25 DIAGNOSIS — I2510 Atherosclerotic heart disease of native coronary artery without angina pectoris: Secondary | ICD-10-CM | POA: Diagnosis present

## 2021-04-25 DIAGNOSIS — K7581 Nonalcoholic steatohepatitis (NASH): Secondary | ICD-10-CM | POA: Diagnosis present

## 2021-04-25 HISTORY — DX: Ischemic cardiomyopathy: I25.5

## 2021-04-25 HISTORY — DX: Heart failure, unspecified: I50.9

## 2021-04-25 HISTORY — PX: ANTERIOR CERVICAL DECOMPRESSION/DISCECTOMY FUSION 4 LEVELS: SHX5556

## 2021-04-25 LAB — SARS CORONAVIRUS 2 BY RT PCR (HOSPITAL ORDER, PERFORMED IN ~~LOC~~ HOSPITAL LAB): SARS Coronavirus 2: NEGATIVE

## 2021-04-25 LAB — CBC
HCT: 43.8 % (ref 39.0–52.0)
Hemoglobin: 14.2 g/dL (ref 13.0–17.0)
MCH: 31.9 pg (ref 26.0–34.0)
MCHC: 32.4 g/dL (ref 30.0–36.0)
MCV: 98.4 fL (ref 80.0–100.0)
Platelets: 175 10*3/uL (ref 150–400)
RBC: 4.45 MIL/uL (ref 4.22–5.81)
RDW: 13.5 % (ref 11.5–15.5)
WBC: 10.3 10*3/uL (ref 4.0–10.5)
nRBC: 0 % (ref 0.0–0.2)

## 2021-04-25 LAB — BASIC METABOLIC PANEL
Anion gap: 10 (ref 5–15)
BUN: 12 mg/dL (ref 6–20)
CO2: 24 mmol/L (ref 22–32)
Calcium: 8.8 mg/dL — ABNORMAL LOW (ref 8.9–10.3)
Chloride: 102 mmol/L (ref 98–111)
Creatinine, Ser: 0.76 mg/dL (ref 0.61–1.24)
GFR, Estimated: >60 mL/min (ref >60)
Glucose, Bld: 169 mg/dL — ABNORMAL HIGH (ref 70–99)
Potassium: 3.9 mmol/L (ref 3.5–5.1)
Sodium: 136 mmol/L (ref 135–145)

## 2021-04-25 LAB — SURGICAL PCR SCREEN
MRSA, PCR: NEGATIVE
Staphylococcus aureus: POSITIVE — AB

## 2021-04-25 LAB — TYPE AND SCREEN
ABO/RH(D): A POS
Antibody Screen: NEGATIVE

## 2021-04-25 LAB — GLUCOSE, CAPILLARY
Glucose-Capillary: 160 mg/dL — ABNORMAL HIGH (ref 70–99)
Glucose-Capillary: 146 mg/dL — ABNORMAL HIGH (ref 70–99)
Glucose-Capillary: 171 mg/dL — ABNORMAL HIGH (ref 70–99)
Glucose-Capillary: 186 mg/dL — ABNORMAL HIGH (ref 70–99)
Glucose-Capillary: 189 mg/dL — ABNORMAL HIGH (ref 70–99)

## 2021-04-25 LAB — HEMOGLOBIN A1C
Hgb A1c MFr Bld: 6.8 % — ABNORMAL HIGH (ref 4.8–5.6)
Mean Plasma Glucose: 148.46 mg/dL

## 2021-04-25 SURGERY — ANTERIOR CERVICAL DECOMPRESSION/DISCECTOMY FUSION 4 LEVELS
Anesthesia: General

## 2021-04-25 MED ORDER — COLLAGENASE 250 UNIT/GM EX OINT
1.0000 "application " | TOPICAL_OINTMENT | CUTANEOUS | Status: DC
  Administered 2021-04-26 – 2021-05-02 (×4): 1 via TOPICAL
  Filled 2021-04-25: qty 30

## 2021-04-25 MED ORDER — SUGAMMADEX SODIUM 200 MG/2ML IV SOLN
INTRAVENOUS | Status: DC | PRN
  Administered 2021-04-25: 200 mg via INTRAVENOUS

## 2021-04-25 MED ORDER — ACETAMINOPHEN 650 MG RE SUPP: 650.0000 mg | RECTAL | Status: DC | PRN

## 2021-04-25 MED ORDER — HYDROMORPHONE HCL 1 MG/ML IJ SOLN
1.0000 mg | INTRAMUSCULAR | Status: DC | PRN
Start: 2021-04-25 — End: 2021-05-03
  Administered 2021-04-26 – 2021-05-01 (×21): 1 mg via INTRAVENOUS
  Filled 2021-04-25 (×23): qty 1

## 2021-04-25 MED ORDER — SODIUM CHLORIDE 0.9 % IR SOLN
1.0000 "application " | Status: DC
  Administered 2021-04-26 – 2021-04-30 (×3): 1

## 2021-04-25 MED ORDER — MEPERIDINE HCL 25 MG/ML IJ SOLN: 6.2500 mg | INTRAMUSCULAR | Status: DC | PRN

## 2021-04-25 MED ORDER — ROCURONIUM BROMIDE 10 MG/ML (PF) SYRINGE
PREFILLED_SYRINGE | INTRAVENOUS | Status: DC | PRN
  Administered 2021-04-25: 40 mg via INTRAVENOUS
  Administered 2021-04-25: 20 mg via INTRAVENOUS
  Administered 2021-04-25: 10 mg via INTRAVENOUS
  Administered 2021-04-25: 50 mg via INTRAVENOUS

## 2021-04-25 MED ORDER — CARVEDILOL 6.25 MG PO TABS
6.2500 mg | ORAL_TABLET | Freq: Two times a day (BID) | ORAL | Status: DC
  Administered 2021-04-26 – 2021-05-02 (×14): 6.25 mg via ORAL
  Filled 2021-04-25 (×14): qty 1

## 2021-04-25 MED ORDER — SODIUM CHLORIDE 0.9% FLUSH
3.0000 mL | Freq: Two times a day (BID) | INTRAVENOUS | Status: DC
  Administered 2021-04-26 – 2021-05-02 (×12): 3 mL via INTRAVENOUS

## 2021-04-25 MED ORDER — PHENYLEPHRINE HCL (PRESSORS) 10 MG/ML IV SOLN
INTRAVENOUS | Status: AC
  Filled 2021-04-25: qty 1

## 2021-04-25 MED ORDER — LOPERAMIDE HCL 2 MG PO CAPS: 2.0000 mg | ORAL_CAPSULE | Freq: Three times a day (TID) | ORAL | Status: DC | PRN

## 2021-04-25 MED ORDER — LIDOCAINE 2% (20 MG/ML) 5 ML SYRINGE
INTRAMUSCULAR | Status: DC | PRN
  Administered 2021-04-25: 60 mg via INTRAVENOUS

## 2021-04-25 MED ORDER — OXYCODONE HCL 5 MG PO TABS: 5.0000 mg | ORAL_TABLET | ORAL | Status: DC | PRN

## 2021-04-25 MED ORDER — EMPAGLIFLOZIN 10 MG PO TABS
10.0000 mg | ORAL_TABLET | Freq: Every day | ORAL | Status: DC
  Administered 2021-04-26 – 2021-05-02 (×7): 10 mg via ORAL
  Filled 2021-04-25 (×8): qty 1

## 2021-04-25 MED ORDER — MIDAZOLAM HCL 2 MG/2ML IJ SOLN
INTRAMUSCULAR | Status: AC
  Filled 2021-04-25: qty 2

## 2021-04-25 MED ORDER — PROPOFOL 10 MG/ML IV BOLUS
INTRAVENOUS | Status: AC
  Filled 2021-04-25: qty 20

## 2021-04-25 MED ORDER — ACETAMINOPHEN 325 MG PO TABS
650.0000 mg | ORAL_TABLET | ORAL | Status: DC | PRN
  Administered 2021-05-01 – 2021-05-03 (×4): 650 mg via ORAL
  Filled 2021-04-25 (×4): qty 2

## 2021-04-25 MED ORDER — NITROGLYCERIN 0.4 MG SL SUBL: 0.4000 mg | SUBLINGUAL_TABLET | SUBLINGUAL | Status: DC | PRN

## 2021-04-25 MED ORDER — FENTANYL CITRATE (PF) 250 MCG/5ML IJ SOLN
INTRAMUSCULAR | Status: DC | PRN
  Administered 2021-04-25: 100 ug via INTRAVENOUS
  Administered 2021-04-25: 50 ug via INTRAVENOUS

## 2021-04-25 MED ORDER — PROPOFOL 10 MG/ML IV BOLUS
INTRAVENOUS | Status: DC | PRN
Start: 2021-04-25 — End: 2021-04-25
  Administered 2021-04-25: 200 mg via INTRAVENOUS

## 2021-04-25 MED ORDER — SODIUM CHLORIDE 0.9% FLUSH
3.0000 mL | INTRAVENOUS | Status: DC | PRN
  Administered 2021-04-30: 3 mL via INTRAVENOUS

## 2021-04-25 MED ORDER — MIDAZOLAM HCL 5 MG/5ML IJ SOLN
INTRAMUSCULAR | Status: DC | PRN
  Administered 2021-04-25: 2 mg via INTRAVENOUS

## 2021-04-25 MED ORDER — FLUTICASONE PROPIONATE 50 MCG/ACT NA SUSP
2.0000 | Freq: Every day | NASAL | Status: DC
  Administered 2021-04-27 – 2021-05-02 (×6): 2 via NASAL
  Filled 2021-04-25: qty 16

## 2021-04-25 MED ORDER — PHENYLEPHRINE HCL-NACL 20-0.9 MG/250ML-% IV SOLN
INTRAVENOUS | Status: DC | PRN
Start: 2021-04-25 — End: 2021-04-25
  Administered 2021-04-25: 30 ug/min via INTRAVENOUS

## 2021-04-25 MED ORDER — GLIPIZIDE 5 MG PO TABS
10.0000 mg | ORAL_TABLET | Freq: Every day | ORAL | Status: DC
  Administered 2021-04-26 – 2021-04-30 (×5): 10 mg via ORAL
  Filled 2021-04-25 (×5): qty 2

## 2021-04-25 MED ORDER — MENTHOL 3 MG MT LOZG: 1.0000 | LOZENGE | OROMUCOSAL | Status: DC | PRN

## 2021-04-25 MED ORDER — CEFAZOLIN SODIUM-DEXTROSE 2-4 GM/100ML-% IV SOLN
2.0000 g | Freq: Three times a day (TID) | INTRAVENOUS | Status: AC
  Administered 2021-04-25 – 2021-04-26 (×2): 2 g via INTRAVENOUS
  Filled 2021-04-25 (×2): qty 100

## 2021-04-25 MED ORDER — LIDOCAINE-EPINEPHRINE 1 %-1:100000 IJ SOLN
INTRAMUSCULAR | Status: AC
  Filled 2021-04-25: qty 1

## 2021-04-25 MED ORDER — CHLORHEXIDINE GLUCONATE CLOTH 2 % EX PADS: 6.0000 | MEDICATED_PAD | Freq: Once | CUTANEOUS | Status: DC

## 2021-04-25 MED ORDER — DOCUSATE SODIUM 100 MG PO CAPS
100.0000 mg | ORAL_CAPSULE | Freq: Two times a day (BID) | ORAL | Status: DC
  Administered 2021-04-25 – 2021-05-02 (×15): 100 mg via ORAL
  Filled 2021-04-25 (×15): qty 1

## 2021-04-25 MED ORDER — SENNOSIDES-DOCUSATE SODIUM 8.6-50 MG PO TABS: 2.0000 | ORAL_TABLET | Freq: Every evening | ORAL | Status: DC | PRN

## 2021-04-25 MED ORDER — ATORVASTATIN CALCIUM 80 MG PO TABS
80.0000 mg | ORAL_TABLET | Freq: Every day | ORAL | Status: DC
  Administered 2021-04-26 – 2021-05-02 (×7): 80 mg via ORAL
  Filled 2021-04-25 (×7): qty 1

## 2021-04-25 MED ORDER — THROMBIN 5000 UNITS EX SOLR
OROMUCOSAL | Status: DC | PRN
  Administered 2021-04-25: 5 mL via TOPICAL

## 2021-04-25 MED ORDER — PROMETHAZINE HCL 25 MG/ML IJ SOLN
INTRAMUSCULAR | Status: AC
  Filled 2021-04-25: qty 1

## 2021-04-25 MED ORDER — POLYETHYLENE GLYCOL 3350 17 G PO PACK: 17.0000 g | PACK | Freq: Every day | ORAL | Status: DC | PRN

## 2021-04-25 MED ORDER — ALBUMIN HUMAN 5 % IV SOLN
INTRAVENOUS | Status: AC
  Filled 2021-04-25: qty 250

## 2021-04-25 MED ORDER — HYDROMORPHONE HCL 1 MG/ML IJ SOLN
INTRAMUSCULAR | Status: AC
  Filled 2021-04-25: qty 1

## 2021-04-25 MED ORDER — ONDANSETRON HCL 4 MG/2ML IJ SOLN
4.0000 mg | Freq: Four times a day (QID) | INTRAMUSCULAR | Status: DC | PRN
Start: 2021-04-25 — End: 2021-05-03

## 2021-04-25 MED ORDER — PHENYLEPHRINE 40 MCG/ML (10ML) SYRINGE FOR IV PUSH (FOR BLOOD PRESSURE SUPPORT)
PREFILLED_SYRINGE | INTRAVENOUS | Status: DC | PRN
  Administered 2021-04-25 (×2): 80 ug via INTRAVENOUS
  Administered 2021-04-25: 40 ug via INTRAVENOUS
  Administered 2021-04-25: 80 ug via INTRAVENOUS
  Administered 2021-04-25: 40 ug via INTRAVENOUS

## 2021-04-25 MED ORDER — LACTATED RINGERS IV SOLN: INTRAVENOUS | Status: DC

## 2021-04-25 MED ORDER — THROMBIN 5000 UNITS EX SOLR
CUTANEOUS | Status: AC
  Filled 2021-04-25: qty 5000

## 2021-04-25 MED ORDER — FENTANYL CITRATE (PF) 250 MCG/5ML IJ SOLN
INTRAMUSCULAR | Status: AC
  Filled 2021-04-25: qty 5

## 2021-04-25 MED ORDER — CHLORHEXIDINE GLUCONATE 0.12 % MT SOLN
15.0000 mL | OROMUCOSAL | Status: AC
  Administered 2021-04-25: 15 mL via OROMUCOSAL
  Filled 2021-04-25: qty 15

## 2021-04-25 MED ORDER — ALBUMIN HUMAN 5 % IV SOLN
12.5000 g | Freq: Once | INTRAVENOUS | Status: AC
  Administered 2021-04-25: 12.5 g via INTRAVENOUS

## 2021-04-25 MED ORDER — 0.9 % SODIUM CHLORIDE (POUR BTL) OPTIME
TOPICAL | Status: DC | PRN
  Administered 2021-04-25: 1000 mL

## 2021-04-25 MED ORDER — PROMETHAZINE HCL 25 MG/ML IJ SOLN
6.2500 mg | INTRAMUSCULAR | Status: DC | PRN
  Administered 2021-04-25: 6.25 mg via INTRAVENOUS

## 2021-04-25 MED ORDER — CEFAZOLIN SODIUM-DEXTROSE 2-4 GM/100ML-% IV SOLN
2.0000 g | INTRAVENOUS | Status: AC
  Administered 2021-04-25 (×2): 2 g via INTRAVENOUS
  Filled 2021-04-25: qty 100

## 2021-04-25 MED ORDER — INSULIN ASPART 100 UNIT/ML IJ SOLN
0.0000 [IU] | Freq: Three times a day (TID) | INTRAMUSCULAR | Status: DC
Start: 2021-04-26 — End: 2021-05-03
  Administered 2021-04-26 – 2021-04-27 (×2): 4 [IU] via SUBCUTANEOUS
  Administered 2021-04-29 – 2021-04-30 (×2): 3 [IU] via SUBCUTANEOUS

## 2021-04-25 MED ORDER — BISMUTH SUBSALICYLATE 262 MG/15ML PO SUSP
30.0000 mL | ORAL | Status: DC | PRN
  Filled 2021-04-25: qty 236

## 2021-04-25 MED ORDER — INSULIN ASPART 100 UNIT/ML IJ SOLN
0.0000 [IU] | Freq: Every day | INTRAMUSCULAR | Status: DC
Start: 2021-04-25 — End: 2021-05-03

## 2021-04-25 MED ORDER — DEXAMETHASONE SODIUM PHOSPHATE 10 MG/ML IJ SOLN
INTRAMUSCULAR | Status: DC | PRN
  Administered 2021-04-25: 5 mg via INTRAVENOUS

## 2021-04-25 MED ORDER — LOSARTAN POTASSIUM 25 MG PO TABS
12.5000 mg | ORAL_TABLET | Freq: Every day | ORAL | Status: DC
  Administered 2021-04-25 – 2021-05-02 (×8): 12.5 mg via ORAL
  Filled 2021-04-25 (×8): qty 0.5

## 2021-04-25 MED ORDER — LIDOCAINE-EPINEPHRINE 1 %-1:100000 IJ SOLN
INTRAMUSCULAR | Status: DC | PRN
  Administered 2021-04-25: 8 mL

## 2021-04-25 MED ORDER — METFORMIN HCL 500 MG PO TABS
500.0000 mg | ORAL_TABLET | Freq: Two times a day (BID) | ORAL | Status: DC
  Administered 2021-04-26 – 2021-05-02 (×12): 500 mg via ORAL
  Filled 2021-04-25 (×13): qty 1

## 2021-04-25 MED ORDER — ONDANSETRON HCL 4 MG PO TABS: 4.0000 mg | ORAL_TABLET | Freq: Four times a day (QID) | ORAL | Status: DC | PRN

## 2021-04-25 MED ORDER — MELATONIN 3 MG PO TABS
3.0000 mg | ORAL_TABLET | Freq: Every day | ORAL | Status: DC
  Administered 2021-04-25 – 2021-05-02 (×8): 3 mg via ORAL
  Filled 2021-04-25 (×8): qty 1

## 2021-04-25 MED ORDER — PHENOL 1.4 % MT LIQD
1.0000 | OROMUCOSAL | Status: DC | PRN
  Filled 2021-04-25: qty 177

## 2021-04-25 MED ORDER — ASPIRIN EC 81 MG PO TBEC
81.0000 mg | DELAYED_RELEASE_TABLET | Freq: Every day | ORAL | Status: DC
  Administered 2021-04-26 – 2021-05-02 (×7): 81 mg via ORAL
  Filled 2021-04-25 (×7): qty 1

## 2021-04-25 MED ORDER — PREGABALIN 100 MG PO CAPS
100.0000 mg | ORAL_CAPSULE | Freq: Three times a day (TID) | ORAL | Status: DC
  Administered 2021-04-25 – 2021-05-02 (×22): 100 mg via ORAL
  Filled 2021-04-25 (×22): qty 1

## 2021-04-25 MED ORDER — SODIUM CHLORIDE 0.9 % IV SOLN: 250.0000 mL | INTRAVENOUS | Status: DC

## 2021-04-25 MED ORDER — HYDROMORPHONE HCL 1 MG/ML IJ SOLN
0.2500 mg | INTRAMUSCULAR | Status: DC | PRN
  Administered 2021-04-25 (×4): 0.5 mg via INTRAVENOUS

## 2021-04-25 MED ORDER — OXYCODONE HCL 5 MG PO TABS
10.0000 mg | ORAL_TABLET | ORAL | Status: DC | PRN
  Administered 2021-04-25 – 2021-05-03 (×34): 10 mg via ORAL
  Filled 2021-04-25 (×34): qty 2

## 2021-04-25 MED ORDER — ALBUTEROL SULFATE (2.5 MG/3ML) 0.083% IN NEBU: 3.0000 mL | INHALATION_SOLUTION | Freq: Four times a day (QID) | RESPIRATORY_TRACT | Status: DC | PRN

## 2021-04-25 MED ORDER — BACLOFEN 10 MG PO TABS
5.0000 mg | ORAL_TABLET | Freq: Three times a day (TID) | ORAL | Status: DC
  Administered 2021-04-25 – 2021-05-01 (×17): 5 mg via ORAL
  Filled 2021-04-25 (×17): qty 1

## 2021-04-25 SURGICAL SUPPLY — 57 items
ADH SKN CLS APL DERMABOND .7 (GAUZE/BANDAGES/DRESSINGS) ×1
APL SKNCLS STERI-STRIP NONHPOA (GAUZE/BANDAGES/DRESSINGS)
BAG COUNTER SPONGE SURGICOUNT (BAG) ×3 IMPLANT
BAG SPNG CNTER NS LX DISP (BAG) ×2
BAND INSRT 18 STRL LF DISP RB (MISCELLANEOUS) ×2
BAND RUBBER #18 3X1/16 STRL (MISCELLANEOUS) ×4 IMPLANT
BENZOIN TINCTURE PRP APPL 2/3 (GAUZE/BANDAGES/DRESSINGS) IMPLANT
BLADE CLIPPER SURG (BLADE) IMPLANT
BLADE SURG 11 STRL SS (BLADE) ×2 IMPLANT
BUR MATCHSTICK NEURO 3.0 LAGG (BURR) ×2 IMPLANT
CANISTER SUCT 3000ML PPV (MISCELLANEOUS) ×2 IMPLANT
DECANTER SPIKE VIAL GLASS SM (MISCELLANEOUS) ×1 IMPLANT
DERMABOND ADVANCED (GAUZE/BANDAGES/DRESSINGS) ×1
DERMABOND ADVANCED .7 DNX12 (GAUZE/BANDAGES/DRESSINGS) ×1 IMPLANT
DRAPE C-ARM 42X72 X-RAY (DRAPES) ×4 IMPLANT
DRAPE HALF SHEET 40X57 (DRAPES) IMPLANT
DRAPE LAPAROTOMY 100X72 PEDS (DRAPES) ×2 IMPLANT
DRAPE MICROSCOPE LEICA (MISCELLANEOUS) ×2 IMPLANT
DURAPREP 6ML APPLICATOR 50/CS (WOUND CARE) ×2 IMPLANT
ELECT COATED BLADE 2.86 ST (ELECTRODE) ×2 IMPLANT
ELECT REM PT RETURN 9FT ADLT (ELECTROSURGICAL) ×2
ELECTRODE REM PT RTRN 9FT ADLT (ELECTROSURGICAL) ×1 IMPLANT
GAUZE 4X4 16PLY ~~LOC~~+RFID DBL (SPONGE) ×1 IMPLANT
GLOVE EXAM NITRILE LRG STRL (GLOVE) IMPLANT
GLOVE EXAM NITRILE XL STR (GLOVE) IMPLANT
GLOVE EXAM NITRILE XS STR PU (GLOVE) IMPLANT
GLOVE SURG LTX SZ7.5 (GLOVE) ×2 IMPLANT
GLOVE SURG UNDER POLY LF SZ7.5 (GLOVE) ×4 IMPLANT
GOWN STRL REUS W/ TWL LRG LVL3 (GOWN DISPOSABLE) ×2 IMPLANT
GOWN STRL REUS W/ TWL XL LVL3 (GOWN DISPOSABLE) IMPLANT
GOWN STRL REUS W/TWL 2XL LVL3 (GOWN DISPOSABLE) IMPLANT
GOWN STRL REUS W/TWL LRG LVL3 (GOWN DISPOSABLE) ×4
GOWN STRL REUS W/TWL XL LVL3 (GOWN DISPOSABLE)
HEMOSTAT POWDER KIT SURGIFOAM (HEMOSTASIS) ×2 IMPLANT
KIT BASIN OR (CUSTOM PROCEDURE TRAY) ×2 IMPLANT
KIT TURNOVER KIT B (KITS) ×2 IMPLANT
NDL SPNL 18GX3.5 QUINCKE PK (NEEDLE) ×1 IMPLANT
NEEDLE HYPO 22GX1.5 SAFETY (NEEDLE) ×2 IMPLANT
NEEDLE SPNL 18GX3.5 QUINCKE PK (NEEDLE) ×2 IMPLANT
NS IRRIG 1000ML POUR BTL (IV SOLUTION) ×2 IMPLANT
PACK LAMINECTOMY NEURO (CUSTOM PROCEDURE TRAY) ×2 IMPLANT
PAD ARMBOARD 7.5X6 YLW CONV (MISCELLANEOUS) ×6 IMPLANT
PIN DISTRACTION 14MM (PIN) IMPLANT
PLATE SPINAL ATLANTIS 82.5 (Plate) ×1 IMPLANT
SCREW SPINAL 4.0X14MM TITANIUM (Screw) ×10 IMPLANT
SPACER BONE CORNERSTONE 8X14 (Orthopedic Implant) ×2 IMPLANT
SPACER BONE CORNERSTONE 9X14 (Block) ×2 IMPLANT
SPONGE INTESTINAL PEANUT (DISPOSABLE) ×2 IMPLANT
SPONGE T-LAP 4X18 ~~LOC~~+RFID (SPONGE) ×2 IMPLANT
STAPLER VISISTAT 35W (STAPLE) IMPLANT
SUT MNCRL AB 3-0 PS2 18 (SUTURE) ×2 IMPLANT
SUT VIC AB 3-0 SH 8-18 (SUTURE) ×3 IMPLANT
TAPE CLOTH 3X10 TAN LF (GAUZE/BANDAGES/DRESSINGS) ×2 IMPLANT
TOWEL GREEN STERILE (TOWEL DISPOSABLE) ×2 IMPLANT
TOWEL GREEN STERILE FF (TOWEL DISPOSABLE) ×2 IMPLANT
TRAY FOLEY MTR SLVR 16FR STAT (SET/KITS/TRAYS/PACK) ×2 IMPLANT
WATER STERILE IRR 1000ML POUR (IV SOLUTION) ×2 IMPLANT

## 2021-04-25 NOTE — Op Note (Signed)
PATIENT: [name]  PROCEDURE DATE: 04/25/21  PRE-OPERATIVE DIAGNOSIS:  Cervical myelopathy   POST-OPERATIVE DIAGNOSIS:  Same   PROCEDURE:  C4-5, C5-6, C6-7, C7-T1 Anterior Cervical Discectomy and Instrumented Fusion   SURGEON:  Surgeon(s) and Role:    Judith Part, MD - Primary   ANESTHESIA: ETGA   BRIEF HISTORY: This is a 50 year old man who had an MI with LOC. He does not recall the events but he fell and I suspect had a central cord event. After an impressive resuscitation by his medical team, he had continued arm/leg weakness and imaging showed cervical stenosis with cord signal change. I discussed this at length with the patient regarding risks / benefits and the difficult situation his cardiac disease creates. After discussion with cardiology, we thought the lowest, but obviously not zero, risk strategy was to allow him to complete 1moof DAPT and then decompress his cord at that time. This was discussed with the patient at length and stressed the increased risk of perioperative events like bleeding, stent occlusion, etc.. We also discussed the usual risks of this approach, expectations regarding outcomes, benefits, and alternatives and the patient wished to proceed with surgical treatment.   OPERATIVE DETAIL: The patient was taken to the operating room and placed on the OR table in the supine position. A formal time out was performed with two patient identifiers and confirmed the operative site. Anesthesia was induced by the anesthesia team.  Fluoroscopy was used to localize the surgical level and an incision was marked in a skin crease. The area was then prepped and draped in a sterile fashion. A transverse linear incision was made on the right side of the neck. The platysma was divided and the sternocleidomastoid muscle was identified. The carotid sheath was palpated, identified, and retracted laterally with the sternocleidomastoid muscle. The strap muscles were identified and  retracted medially and the pretracheal fascia was entered. A bent spinal needle was used with fluoroscopy to localize the surgical level after dissection. The longus colli were elevated bilaterally and a self-retaining retractor was placed. The endotracheal tube cuff balloon was deflated and reinflated after retractor placement.   Anterior osteophytes were removed until flush with the anterior vertebral body. The disc annulus was incised and a complete C4-C5 discectomy was performed. The posterior longitudinal ligament was incised followed by ligamentous and bony removal until no central canal stenosis was present. Decompression was then taken out laterally into the bilateral foramina until no foraminal stenosis was palpable. A cortical allograft ([employer]tr[employer]) was inserted into the disc space as an interbody graft.   Retractors were moved and the above technique was then repeated at the C5-6, C6-7, and C7-T1 disc spaces with expected intra-operative findings of stenosis at each level, consistent with the preoperative imaging findings.   An anterior plate ([employer]tronic) was then positioned across the above levels and and 10, 158mscrews were used to secure the plate to the C4, C5, C6, C7, and T1 vertebral bodies. Hemostasis was obtained and the incision was closed in layers. All instrument and sponge counts were correct. The patient was then returned to anesthesia for emergence. No apparent complications at the completion of the procedure. There wasn't any significant oozing and hemostasis was good for the case and at closure, so a drain was not placed.    EBL:  10012m DRAINS: none   SPECIMENS: none   ThoJudith PartD 04/25/21 12:44 PM

## 2021-04-25 NOTE — H&P (Signed)
Surgical H&P Update  HPI: 50 y.o. man with a history of STEMI in June with likely related fall and resulting central cord syndrome with continued myelopathy. No change/improvement in symptoms, no new cardiac symptoms since I last saw him.   PMHx:  Past Medical History:  Diagnosis Date   CHF (congestive heart failure) ([place])    Coronary artery disease    Diabetes mellitus without complication ([place])    Ischemic cardiomyopathy    Myocardial infarction [facility])    inferior STEMI ~ 2016; 01/19/21 with CHB/cardiogenic shock requiring brief TVP, IABP, s/p aspiration thrombectomy/ballon angioplasty for late thrombosis mRCA stent & DES for acute occlusion mLCX   FamHx:  Family History  Problem Relation Age of Onset   Breast cancer Mother    Heart attack Father    Diabetes Father    Heart disease Father    Lung cancer Paternal Grandmother    SocHx:  reports that he has been smoking cigarettes. He has a 33.00 pack-year smoking history. He has never used smokeless tobacco. He reports that he does not currently use alcohol. He reports that he does not use drugs.  Physical Exam: RUE 1/5 proximally, 3/5 distally with minimal hand grip LUE 3/5 proximally and distally RLE 3/5 w/ increased tone LLE 3/5 w/ increased tone and some proximal groups only 1/5 +hoffman's R>L, clonus L>R at the ankles, R distal foot amp, b/l symmetric stocking-glove distribution numbness  Assesment/Plan: 50 y.o. man with severe cervical myelopathy, recent MI, here for ACDF. Risks, benefits, and alternatives discussed and the patient would like to continue with surgery. Along with the normal risks of surgery, we discussed the increased risks involving his recent cardiac events and their potential perioperative issues as well as ongoing antiplatelet use.  -OR today -4NP post-op  Judith Part, MD 04/25/21 12:40 PM

## 2021-04-25 NOTE — Progress Notes (Signed)
Cardiology recommended to hold aspirin five days prior to today's surgery. Patient was given baby aspirin by skilled facility yesterday. MD made aware. No new orders at this time.

## 2021-04-25 NOTE — Anesthesia Procedure Notes (Signed)
Procedure Name: Intubation Date/Time: 04/25/2021 1:08 PM Performed by: Griffin Dakin, CRNA Pre-anesthesia Checklist: Patient identified, Emergency Drugs available, Suction available and Patient being monitored Patient Re-evaluated:Patient Re-evaluated prior to induction Oxygen Delivery Method: Circle system utilized Preoxygenation: Pre-oxygenation with 100% oxygen Induction Type: IV induction Ventilation: Mask ventilation without difficulty Laryngoscope Size: Glidescope and 4 Grade View: Grade I Tube type: Oral Tube size: 7.5 mm Number of attempts: 1 Airway Equipment and Method: Stylet and Oral airway Placement Confirmation: ETT inserted through vocal cords under direct vision, positive ETCO2 and breath sounds checked- equal and bilateral Secured at: 26 cm Tube secured with: Tape Dental Injury: Teeth and Oropharynx as per pre-operative assessment

## 2021-04-25 NOTE — Transfer of Care (Signed)
Immediate Anesthesia Transfer of Care Note  Patient: [name]  Procedure(s) Performed: Cervical Four-Five, Cervical Five-Six, Cervical Six-Seven, Cervical Seven- Thoracic One  Anterior cervical decompression/Discectomy/fusion  Patient Location: PACU  Anesthesia Type:General  Level of Consciousness: awake and oriented  Airway & Oxygen Therapy: Patient Spontanous Breathing and Patient connected to nasal cannula oxygen  Post-op Assessment: Report given to RN, Post -op Vital signs reviewed and stable and Patient moving all extremities  Post vital signs: Reviewed and stable  Last Vitals:  Vitals Value Taken Time  BP 87/55 04/25/21 1802  Temp    Pulse 70 04/25/21 1808  Resp 11 04/25/21 1808  SpO2 97 % 04/25/21 1808  Vitals shown include unvalidated device data.  Last Pain:  Vitals:   04/25/21 1030  PainSc: 6       Patients Stated Pain Goal: 5 (86/10/42 4731)  Complications: No notable events documented.

## 2021-04-25 NOTE — Anesthesia Procedure Notes (Signed)
Arterial Line Insertion Start/End9/02/2021 1:15 PM, 04/25/2021 1:30 PM Performed by: Nolon Nations, MD, anesthesiologist  Patient location: OR. Preanesthetic checklist: patient identified, IV checked, site marked, risks and benefits discussed, surgical consent, monitors and equipment checked, pre-op evaluation, timeout performed and anesthesia consent Lidocaine 1% used for infiltration Left, radial was placed Catheter size: 20 G Hand hygiene performed  and maximum sterile barriers used   Attempts: 2 Procedure performed without using ultrasound guided technique. Following insertion, dressing applied and Biopatch. Post procedure assessment: normal and unchanged  Patient tolerated the procedure well with no immediate complications.

## 2021-04-26 ENCOUNTER — Encounter (HOSPITAL_COMMUNITY): Payer: Self-pay | Admitting: Neurological Surgery

## 2021-04-26 LAB — GLUCOSE, CAPILLARY
Glucose-Capillary: 162 mg/dL — ABNORMAL HIGH (ref 70–99)
Glucose-Capillary: 91 mg/dL (ref 70–99)
Glucose-Capillary: 68 mg/dL — ABNORMAL LOW (ref 70–99)
Glucose-Capillary: 92 mg/dL (ref 70–99)
Glucose-Capillary: 119 mg/dL — ABNORMAL HIGH (ref 70–99)

## 2021-04-26 MED ORDER — CHLORHEXIDINE GLUCONATE CLOTH 2 % EX PADS
6.0000 | MEDICATED_PAD | Freq: Every day | CUTANEOUS | Status: AC
  Administered 2021-04-26 – 2021-04-30 (×3): 6 via TOPICAL

## 2021-04-26 MED ORDER — MUPIROCIN 2 % EX OINT
1.0000 | TOPICAL_OINTMENT | Freq: Two times a day (BID) | CUTANEOUS | Status: AC
Start: 2021-04-26 — End: 2021-05-01
  Administered 2021-04-26 – 2021-04-30 (×9): 1 via NASAL
  Filled 2021-04-26 (×2): qty 22

## 2021-04-26 NOTE — Consult Note (Signed)
WOC Nurse Consult Note: Patient receiving care in [place]. Reason for Consult: old right foot incision Wound type: HEALED incision--no need for a dressing.  If the patient prefers to have a dressing over the healed incision site, place a size appropriate foam dressing to the area.  Thank you for the consult.  Discussed plan of care with the patient and bedside nurse.  [place] nurse will not follow at this time.  Please re-consult the [place] team if needed.  Val Riles, RN, MSN, CWOCN, CNS-BC, pager [phone]

## 2021-04-26 NOTE — Anesthesia Postprocedure Evaluation (Signed)
Anesthesia Post Note  Patient: [name]  Procedure(s) Performed: Cervical Four-Five, Cervical Five-Six, Cervical Six-Seven, Cervical Seven- Thoracic One  Anterior cervical decompression/Discectomy/fusion     Patient location during evaluation: PACU Anesthesia Type: General Level of consciousness: awake and alert Pain management: pain level controlled Vital Signs Assessment: post-procedure vital signs reviewed and stable Respiratory status: spontaneous breathing, nonlabored ventilation, respiratory function stable and patient connected to nasal cannula oxygen Cardiovascular status: blood pressure returned to baseline and stable Postop Assessment: no apparent nausea or vomiting Anesthetic complications: no   No notable events documented.  Last Vitals:  Vitals:   04/25/21 2300 04/26/21 0252  BP: 113/76 108/74  Pulse: 83 84  Resp:  20  Temp: 37.4 C 37.7 C  SpO2: 95% 92%    Last Pain:  Vitals:   04/26/21 0459  TempSrc:   PainSc: [place]

## 2021-04-26 NOTE — Progress Notes (Signed)
Inpatient Rehab Admissions Coordinator Note:  Per OT recommendations patient was screened for CIR candidacy by Michel Santee, PT. Pt known to CIR from previous consult.  Pt does not have the caregiver support to facilitate a CIR admission.  We will not pursue a rehab consult at this time.  Please contact me with questions.    Shann Medal, PT, DPT [phone] 04/26/21 4:07 PM

## 2021-04-26 NOTE — Progress Notes (Signed)
Neurosurgery Service Progress Note  Subjective: No acute events overnight. Neck pain as expected, feels his hand numbness has already improved some   Objective: Vitals:   04/25/21 2148 04/25/21 2300 04/26/21 0252 04/26/21 0732  BP: 112/75 113/76 108/74 113/78  Pulse: 95 83 84 83  Resp: 17  20 18   Temp: 98.7 F (37.1 C) 99.3 F (37.4 C) 99.9 F (37.7 C) 97.9 F (36.6 C)  TempSrc: Oral Oral Oral   SpO2:  95% 92%   Weight:      Height:        Physical Exam: Strength 3/5 in BUE and BLE w/ inc'd tone, no hoffman's, reflexes 1+, b/l stocking-glove numbness Neck soft, incision c/di/  Assessment & Plan: 50 y.o. man s/p 4 level ACDF for severe cervical myelopathy, recovering well.  -continue ASA81 given pt's cardiac risk -PT/OT eval today -advance diet as tolerated -activity as tolerated, no c-collar needed -dispo pending PT / OT recs -SCDs/TEDs, SQH 9/9  Judith Part  04/26/21 8:53 AM

## 2021-04-26 NOTE — Evaluation (Signed)
Physical Therapy Evaluation Patient Details Name: [name] MRN: [id number] DOB: [birth year] Today's Date: 04/26/2021   History of Present Illness  [name] is a 50 y/o male admitted from [facility] 04/25/21 for C4-5, C5-6, C6-7, C7-T1 Anterior Cervical Discectomy and Instrumented Fusion. 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 Pt to benefit from acute PT to address deficits. Pt requiring max +2 for bed mobility and transfer to standing position. PT spoke with CIR admissions team, according to their records pt lacks 24/7 assist post-acutely. Therefore, PT recommending SNF. PT to progress mobility as tolerated, and will continue to follow acutely.      Follow Up Recommendations SNF    Equipment Recommendations  None recommended by PT    Recommendations for Other Services       Precautions / Restrictions Precautions Precautions: Fall;Cervical Precaution Booklet Issued: No (reviewed verbally - will need next session) Required Braces or Orthoses:  (no brace per orders) Restrictions Weight Bearing Restrictions: No      Mobility  Bed Mobility Overal bed mobility: Needs Assistance Bed Mobility: Rolling;Sidelying to Sit;Sit to Sidelying Rolling: Max assist;+2 for physical assistance;+2 for safety/equipment Sidelying to sit: Max assist;+2 for physical assistance;+2 for safety/equipment;HOB elevated     Sit to sidelying: Max assist;+2 for physical assistance;+2 for safety/equipment;HOB elevated General bed mobility comments: multimodal cues for sequencing and max A +2 for physical assist to perform tasks    Transfers Overall  transfer level: Needs assistance Equipment used: 2 person hand held assist Transfers: Sit to/from Stand Sit to Stand: Max assist;+2 physical assistance;+2 safety/equipment         General transfer comment: hypertonicity in extensors in WB except for glutes; max +2 for rise, hip extension, LE blocking, and steadying. STS x3 from EOB.  Ambulation/Gait             General Gait Details: unable  Stairs            Wheelchair Mobility    Modified Rankin (Stroke Patients Only)       Balance Overall balance assessment: Needs assistance Sitting-balance support: Single extremity supported;Feet supported Sitting balance-Leahy Scale: Fair Sitting balance - Comments: supervision level for EOB, cannot accept challenge Postural control: Posterior lean Standing balance support: Bilateral upper extremity supported Standing balance-Leahy Scale: Zero Standing balance comment: legs lock out and strong posterior lean                             Pertinent Vitals/Pain Pain Assessment: Faces Faces Pain Scale: Hurts even more Pain Location: incisional site Pain Descriptors / Indicators: Grimacing;Discomfort;Tightness Pain Intervention(s): Limited activity within patient's tolerance;Monitored during session;Repositioned    Home Living Family/patient expects to be discharged to:: Skilled nursing facility [facility]) Living Arrangements: Alone Available Help at Discharge: Friend(s);Family;Other (Comment) (pt states he could have 24/7 assist from cousin and her 4 sons) Type of Home: Apartment Home Access: Level entry     Home Layout: One level   Additional Comments: truck driver, 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    Prior Function Level of Independence: Needs assistance   Gait / Transfers Assistance Needed: WC for mobility, was doing transfers with RW SPT  ADL's /  Homemaking Assistance Needed: dependent, trouble with grasp and self  feeding  Comments: from SNF since cardiac event in June - doing PT bike and HEP (therbands)     Hand Dominance   Dominant Hand: Right    Extremity/Trunk Assessment   Upper Extremity Assessment Upper Extremity Assessment: Defer to OT evaluation    Lower Extremity Assessment Lower Extremity Assessment: RLE deficits/detail;LLE deficits/detail RLE Deficits / Details: + contraction knee extension, knee flexion, hip flexors, DF/PF. Hypertonicity in extensors and adductors limiting MMT exam RLE Sensation: WNL RLE Coordination: decreased gross motor LLE Deficits / Details: + contraction knee extension, knee flexion, hip flexors, DF/PF. Hypertonicity in extensors and adductors limiting MMT exam LLE Sensation: WNL LLE Coordination: decreased gross motor    Cervical / Trunk Assessment Cervical / Trunk Assessment: Other exceptions Cervical / Trunk Exceptions: s/p C4-T1 sx  Communication   Communication: No difficulties  Cognition Arousal/Alertness: Awake/alert Behavior During Therapy: WFL for tasks assessed/performed Overall Cognitive Status: No family/caregiver present to determine baseline cognitive functioning                                 General Comments: WFL command following, arousal, participation      General Comments      Exercises     Assessment/Plan    PT Assessment Patient needs continued PT services  PT Problem List Decreased strength;Decreased mobility;Decreased safety awareness;Impaired tone;Decreased range of motion;Decreased coordination;Decreased activity tolerance;Decreased balance;Decreased knowledge of use of DME;Pain       PT Treatment Interventions DME instruction;Therapeutic activities;Gait training;Therapeutic exercise;Patient/family education;Balance training;Stair training;Functional mobility training    PT Goals (Current goals can be found in the Care Plan section)  Acute Rehab PT Goals Patient Stated Goal: To go to a more  intense rehab and walk again PT Goal Formulation: With patient Time For Goal Achievement: 05/10/21 Potential to Achieve Goals: Good    Frequency Min 5X/week   Barriers to discharge        Co-evaluation PT/OT/SLP Co-Evaluation/Treatment: Yes Reason for Co-Treatment: Complexity of the patient's impairments (multi-system involvement);For patient/therapist safety;To address functional/ADL transfers PT goals addressed during session: Mobility/safety with mobility;Balance;Strengthening/ROM         AM-PAC PT "6 Clicks" Mobility  Outcome Measure Help needed turning from your back to your side while in a flat bed without using bedrails?: A Lot Help needed moving from lying on your back to sitting on the side of a flat bed without using bedrails?: A Lot Help needed moving to and from a bed to a chair (including a wheelchair)?: Total Help needed standing up from a chair using your arms (e.g., wheelchair or bedside chair)?: Total Help needed to walk in hospital room?: Total Help needed climbing 3-5 steps with a railing? : Total 6 Click Score: 8    End of Session Equipment Utilized During Treatment: Gait belt Activity Tolerance: Patient limited by fatigue;Patient limited by pain Patient left: in bed;with call bell/phone within reach;with bed alarm set Nurse Communication: Mobility status PT Visit Diagnosis: Other abnormalities of gait and mobility (R26.89);Muscle weakness (generalized) (M62.81);Other symptoms and signs involving the nervous system (R29.898)    Time: 1225-1305 PT Time Calculation (min) (ACUTE ONLY): 40 min   Charges:   PT Evaluation $PT Eval Moderate Complexity: 1 Mod         Ruberta Holck S, PT DPT Acute Rehabilitation Services Pager [phone]  Office [phone]   Louis Matte 04/26/2021, 4:38 PM

## 2021-04-26 NOTE — Evaluation (Signed)
Occupational Therapy Evaluation Patient Details Name: [name] MRN: [id number] DOB: 11-01-1970 Today's Date: 04/26/2021    History of Present Illness [name] is a 50 y/o male admitted from [facility] 04/25/21 for C4-5, C5-6, C6-7, C7-T1 Anterior Cervical Discectomy and Instrumented Fusion. 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 Prior to that he was independent in ADL and mobility. AT the SNF he was getting assist for transfers to a [facility] and getting assist for all aspects of ADL. Today he presents as a very motivated and determined individual. He is overall max A +2 for all aspects of bed mobility and transfers. He has BUE weakness, ataxic-like movement at times, and decreased grasp strength which impacts all aspects of ADL. He is overall max A to mod A for ADL. Pt will benefit from skilled OT at the CIR level post-acute to maximize safety and independence in ADL and mobility/transfers. Next session to focus on grasp and potential for adaptive equipment.    Follow Up Recommendations  CIR    Equipment Recommendations  3 in 1 bedside commode;Tub/shower bench;Wheelchair (measurements OT);Wheelchair cushion (measurements OT)    Recommendations for Other Services Rehab consult     Precautions / Restrictions Precautions Precautions: Fall;Cervical Precaution Booklet Issued: No (reviewed verbally - will need next session) Required Braces or Orthoses:  (no brace per orders) Restrictions Weight Bearing Restrictions: No      Mobility Bed Mobility Overal bed mobility: Needs Assistance Bed Mobility: Rolling;Sidelying to Sit;Sit to Sidelying Rolling: Max assist;+2 for physical assistance;+2 for safety/equipment Sidelying to  sit: Max assist;+2 for physical assistance;+2 for safety/equipment;HOB elevated     Sit to sidelying: Max assist;+2 for physical assistance;+2 for safety/equipment;HOB elevated General bed mobility comments: multimodal cues for sequencing and max A +2 for physical assist to perform tasks    Transfers Overall transfer level: Needs assistance Equipment used: 2 person hand held assist Transfers: Sit to/from Stand Sit to Stand: Max assist;+2 physical assistance;+2 safety/equipment         General transfer comment: legs very tonal and difficult to position, grasp is weak and Pt unable to sustain grasp on arms to assist, Pt locks out knees to take body weight, max A +2 for all aspects of physical assist    Balance Overall balance assessment: Needs assistance Sitting-balance support: Single extremity supported;Feet supported Sitting balance-Leahy Scale: Fair Sitting balance - Comments: able to sit with min guard for safety - reports increased in pain Postural control: Posterior lean Standing balance support: Bilateral upper extremity supported Standing balance-Leahy Scale: Zero Standing balance comment: legs lock out and strong posterior lean                           ADL either performed or assessed with clinical judgement   ADL Overall ADL's : Needs assistance/impaired Eating/Feeding: Moderate assistance;Sitting Eating/Feeding Details (indicate cue type and reason): can pick up french fries, grasp is weak and is not able to be sustained, to bring cups to lips for drinking requires 2 hands; unable to open containers at this time Grooming: Moderate assistance;Sitting;Bed level   Upper Body Bathing: Moderate assistance   Lower Body Bathing: Maximal assistance   Upper Body Dressing : Maximal assistance   Lower Body Dressing: Maximal assistance  Toilet Transfer: Maximal assistance;+2 for physical assistance;+2 for safety/equipment   Toileting- Clothing Manipulation and  Hygiene: Total assistance       Functional mobility during ADLs: Maximal assistance;+2 for physical assistance;+2 for safety/equipment General ADL Comments: decreased use of BUE, movements almost ataxic in nature.     Vision Baseline Vision/History: 1 Wears glasses Ability to See in Adequate Light: 1 Impaired (baseline) Patient Visual Report: No change from baseline       Perception     Praxis      Pertinent Vitals/Pain Pain Assessment: Faces Faces Pain Scale: Hurts even more Pain Location: incisional site Pain Descriptors / Indicators: Grimacing;Discomfort;Tightness Pain Intervention(s): Monitored during session;Repositioned     Hand Dominance Right   Extremity/Trunk Assessment Upper Extremity Assessment Upper Extremity Assessment: RUE deficits/detail;LUE deficits/detail RUE Deficits / Details: grasp 2/5, elbow 3/5, shoulder little to no abduction, FF 30 degrees RUE Sensation: WNL RUE Coordination: decreased fine motor;decreased gross motor LUE Deficits / Details: graso 2/5, elbow 4/5, shoulder FF and abduction weak and decreased ROM LUE Sensation: WNL LUE Coordination: decreased fine motor;decreased gross motor   Lower Extremity Assessment Lower Extremity Assessment: Defer to PT evaluation   Cervical / Trunk Assessment Cervical / Trunk Assessment: Other exceptions Cervical / Trunk Exceptions: s/p C4-T1 sx   Communication Communication Communication: No difficulties   Cognition Arousal/Alertness: Awake/alert Behavior During Therapy: WFL for tasks assessed/performed Overall Cognitive Status: No family/caregiver present to determine baseline cognitive functioning                                 General Comments: AandO x4, will continuw to assess with functional activities   General Comments  VSS throughout session    Exercises     Shoulder Instructions      Home Living Family/patient expects to be discharged to:: Skilled nursing facility  [facility]) Living Arrangements: Alone Available Help at Discharge: Friend(s);Family;Available PRN/intermittently Type of Home: Apartment Home Access: Level entry     Home Layout: One level     Bathroom Shower/Tub: Teacher, early years/pre: Standard         Additional Comments: truck driver, 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      Prior Functioning/Environment Level of Independence: Needs assistance  Gait / Transfers Assistance Needed: WC for mobility, was doing transfers with RW SPT ADL's / Homemaking Assistance Needed: dependent, trouble with grasp and self feeding   Comments: from SNF since cardiac event in June - doing PT bike and HEP (therbands)        OT Problem List: Decreased strength;Decreased range of motion;Decreased activity tolerance;Impaired balance (sitting and/or standing);Decreased coordination;Decreased safety awareness;Decreased knowledge of use of DME or AE;Decreased knowledge of precautions;Impaired UE functional use;Pain;Increased edema      OT Treatment/Interventions: Self-care/ADL training;Therapeutic exercise;Neuromuscular education;DME and/or AE instruction;Energy conservation;Manual therapy;Therapeutic activities;Patient/family education;Balance training    OT Goals(Current goals can be found in the care plan section) Acute Rehab OT Goals Patient Stated Goal: To go to a more intense rehab and walk again OT Goal Formulation: With patient Time For Goal Achievement: 05/10/21 Potential to Achieve Goals: Good ADL Goals Pt Will Perform Eating: with set-up;with adaptive utensils;sitting Pt Will Perform Grooming: with set-up;sitting;with adaptive equipment Pt Will Perform Upper Body Dressing: with supervision;sitting Pt Will Transfer to Toilet: with max assist;squat pivot transfer Pt Will Perform Toileting - Clothing Manipulation and hygiene: with min guard assist;with  min assist;sitting/lateral leans Additional ADL  Goal #1: Pt will perform bed mobility maintaining back precautions at min A level  OT Frequency: Min 2X/week   Barriers to D/C:            Co-evaluation PT/OT/SLP Co-Evaluation/Treatment: Yes Reason for Co-Treatment: Complexity of the patient's impairments (multi-system involvement);Necessary to address cognition/behavior during functional activity;To address functional/ADL transfers;For patient/therapist safety PT goals addressed during session: Mobility/safety with mobility;Balance;Strengthening/ROM OT goals addressed during session: ADL's and self-care;Proper use of Adaptive equipment and DME;Strengthening/ROM      AM-PAC OT "6 Clicks" Daily Activity     Outcome Measure Help from another person eating meals?: A Lot Help from another person taking care of personal grooming?: A Lot Help from another person toileting, which includes using toliet, bedpan, or urinal?: Total Help from another person bathing (including washing, rinsing, drying)?: A Lot Help from another person to put on and taking off regular upper body clothing?: A Lot Help from another person to put on and taking off regular lower body clothing?: Total 6 Click Score: 10   End of Session Equipment Utilized During Treatment: Gait belt Nurse Communication: Mobility status;Precautions  Activity Tolerance: Patient tolerated treatment well Patient left: in bed;with call bell/phone within reach;with bed alarm set;with SCD's reapplied  OT Visit Diagnosis: Unsteadiness on feet (R26.81);Repeated falls (R29.6);Muscle weakness (generalized) (M62.81);History of falling (Z91.81);Other symptoms and signs involving the nervous system (R29.898);Pain Pain - Right/Left: Left Pain - part of body: Shoulder                Time: 1225-1305 OT Time Calculation (min): 40 min Charges:  OT General Charges $OT Visit: 1 Visit OT Evaluation $OT Eval Moderate Complexity: 1 Mod OT Treatments $Self Care/Home Management : 8-22 mins  Jesse Sans  OTR/L Acute Rehabilitation Services Pager: [phone] Office: [place] 04/26/2021, 2:04 PM

## 2021-04-26 NOTE — Progress Notes (Signed)
Inpatient Diabetes Program Recommendations  AACE/ADA: New Consensus Statement on Inpatient Glycemic Control   Target Ranges:  Prepandial:   less than 140 mg/dL      Peak postprandial:   less than 180 mg/dL (1-2 hours)      Critically ill patients:  140 - 180 mg/dL   Results for [name], [name] (MRN [id number]) as of 04/26/2021 10:55  Ref. Range 04/25/2021 10:05 04/25/2021 14:16 04/25/2021 18:10 04/25/2021 18:29 04/25/2021 22:48 04/26/2021 07:31  Glucose-Capillary Latest Ref Range: 70 - 99 mg/dL 160 (H) 146 (H) 186 (H) 171 (H) 189 (H) 162 (H)  Results for [name], [name] (MRN [id number]) as of 04/26/2021 10:55  Ref. Range 04/25/2021 10:01  Hemoglobin A1C Latest Ref Range: 4.8 - 5.6 % 6.8 (H)    Review of Glycemic Control  Diabetes history: DM2 Outpatient Diabetes medications: Jardiance 10 mg daily, Glipizide 10 mg daily, Metformin 500 mg BID, Novolog 0-10 units TID with meals Current orders for Inpatient glycemic control: Novolog 0-20 units TID with meals, Novolog 0-5 units QHS, Metformin 500 mg BID, Glipizide 10 mg daily, Jardiance 10 mg daily   NOTE: Noted consult for post op glucose control. Chart reviewed. Noted patient had cervical surgery on 04/25/21 and received Decadron 5 mg at 13:57 on 04/25/21. Fasting glucose 162 mg/dl this morning and patient has already been given Metformin, Glipizide, Jardiance, and Novolog correction this morning. Will continue to follow along and make recommendations if needed.  Thanks, Barnie Alderman, RN, MSN, CDE Diabetes Coordinator Inpatient Diabetes Program [phone] (Team Pager from 8am to 5pm)

## 2021-04-27 ENCOUNTER — Inpatient Hospital Stay (HOSPITAL_COMMUNITY): Payer: Medicaid Other

## 2021-04-27 LAB — GLUCOSE, CAPILLARY
Glucose-Capillary: 106 mg/dL — ABNORMAL HIGH (ref 70–99)
Glucose-Capillary: 159 mg/dL — ABNORMAL HIGH (ref 70–99)
Glucose-Capillary: 70 mg/dL (ref 70–99)
Glucose-Capillary: 119 mg/dL — ABNORMAL HIGH (ref 70–99)

## 2021-04-27 MED ORDER — DEXAMETHASONE SODIUM PHOSPHATE 4 MG/ML IJ SOLN
4.0000 mg | Freq: Four times a day (QID) | INTRAMUSCULAR | Status: AC
  Administered 2021-04-27 – 2021-04-30 (×11): 4 mg via INTRAVENOUS
  Filled 2021-04-27 (×11): qty 1

## 2021-04-27 MED ORDER — SODIUM CHLORIDE 0.9 % IV SOLN: INTRAVENOUS | Status: DC

## 2021-04-27 MED ORDER — DEXAMETHASONE SODIUM PHOSPHATE 10 MG/ML IJ SOLN
10.0000 mg | Freq: Once | INTRAMUSCULAR | Status: AC
  Administered 2021-04-27: 10 mg via INTRAVENOUS
  Filled 2021-04-27: qty 1

## 2021-04-27 NOTE — Progress Notes (Signed)
Neurosurgery Service Progress Note  Subjective: No acute events overnight. Hands are improving, legs feel stiffer than preop subjecitvely, having worsening dysphagia  Objective: Vitals:   04/26/21 2021 04/26/21 2259 04/27/21 0357 04/27/21 0910  BP: 102/66 105/69 116/73 112/71  Pulse: 90 90 85 90  Resp: 18 17 18 18   Temp: 99.2 F (37.3 C) 98.9 F (37.2 C) 99 F (37.2 C) 98 F (36.7 C)  TempSrc: Oral Oral Oral   SpO2: 94% 95% 93% 98%  Weight:      Height:        Physical Exam: Strength 3/5 in BUE and BLE w/ inc'd tone, no hoffman's, reflexes 1+, b/l stocking-glove numbness Neck soft, incision c/di/  Assessment & Plan: 50 y.o. man s/p 4 level ACDF for severe cervical myelopathy, recovering well.  -continue ASA81 given pt's cardiac risk -PT/OT rec'd SNF -advance diet as tolerated -AP/lateral xrays to eval for hardware and soft tissue edema -dex 10x1 for dysphagia, keep inpatient until he can reliably swallow nutrition  -activity as tolerated, no c-collar needed -dispo pending PT / OT recs -SCDs/TEDs, SQH 9/9  Marcello Moores A Jaydah Stahle  04/27/21 10:14 AM

## 2021-04-27 NOTE — Progress Notes (Addendum)
Physical Therapy Treatment Patient Details Name: [name] MRN: [id number] DOB: 1970/10/29 Today's Date: 04/27/2021    History of Present Illness Demari Gales is a 50 y/o male admitted from [facility] 04/25/21 for C4-5, C5-6, C6-7, C7-T1 Anterior Cervical Discectomy and Instrumented Fusion. Pt with PMH including: CHF, CAD, Diabetes mellitus without complication, Ischemic cardiomyopathy, and Myocardial infarction (01/19/21), Amputation (Right transmet, 10/11/2020); Lower Extremity Angiography (Right, 10/03/2020)    PT Comments    Pt resting in bed, states he wants to get OOB. Pt requiring max +1-2 to get to EOB and transfer to standing in bari stedy, pt with poor functional strength in Les but uses extensor hypertonicity to lock into standing position. Maximove lift pad placed under pt for transfer back to bed, NT aware. Of note, pt with coughing during eating at end of session, PT had to provide diaphragmatic cough assist to expel [place] from pt's throat as pt stated it was "stuck". PT strongly encouraging an SLP consult, MD contacted and aware. Will continue to follow.    Follow Up Recommendations  SNF     Equipment Recommendations  None recommended by PT    Recommendations for Other Services       Precautions / Restrictions Precautions Precautions: Fall;Cervical Required Braces or Orthoses: Other Brace Other Brace: no brace needed per orders Restrictions Weight Bearing Restrictions: No    Mobility  Bed Mobility Overal bed mobility: Needs Assistance Bed Mobility: Rolling;Sidelying to Sit Rolling: Max assist Sidelying to sit: Max assist       General bed mobility comments: max assist for log roll technique to EOB, including trunk elevation and LE lowering over EOB.    Transfers Overall transfer level: Needs assistance Equipment used: Ambulation equipment used   Sit to Stand: Max assist;+2 physical assistance;+2 safety/equipment         General transfer comment: Max +2  assist for power up, rise, hip facilitation to upright, hand placement. STS from EOB x1, from stedy x1. Pt with very little functional strength LEs, but uses extensor hypertonicity to stand and stay upright  Ambulation/Gait                 Stairs             Wheelchair Mobility    Modified Rankin (Stroke Patients Only)       Balance Overall balance assessment: Needs assistance Sitting-balance support: Single extremity supported;Feet supported Sitting balance-Leahy Scale: Fair Sitting balance - Comments: supervision level for EOB, cannot accept challenge Postural control: Posterior lean Standing balance support: Bilateral upper extremity supported Standing balance-Leahy Scale: Zero Standing balance comment: legs lock out and strong posterior lean                            Cognition Arousal/Alertness: Awake/alert Behavior During Therapy: WFL for tasks assessed/performed Overall Cognitive Status: No family/caregiver present to determine baseline cognitive functioning                                 General Comments: WFL command following, arousal, participation. Appears disheartened about current mobility      Exercises General Exercises - Lower Extremity Long Arc Quad: AAROM;Both;10 reps;Seated (1/5 knee extensors LLE, 2/5 knee extensors RLE - facilitated LAQ by having pt initiate contraction)    General Comments        Pertinent Vitals/Pain Pain Assessment: Faces Faces Pain Scale: Hurts even  more Pain Location: incisional site, neck Pain Descriptors / Indicators: Grimacing;Discomfort;Tightness Pain Intervention(s): Limited activity within patient's tolerance;Monitored during session;Repositioned    Home Living                      Prior Function            PT Goals (current goals can now be found in the care plan section) Acute Rehab PT Goals Patient Stated Goal: To go to a more intense rehab and walk again PT  Goal Formulation: With patient Time For Goal Achievement: 05/10/21 Potential to Achieve Goals: Good Progress towards PT goals: Progressing toward goals    Frequency    Min 5X/week      PT Plan Current plan remains appropriate    Co-evaluation              AM-PAC PT "6 Clicks" Mobility   Outcome Measure  Help needed turning from your back to your side while in a flat bed without using bedrails?: A Lot Help needed moving from lying on your back to sitting on the side of a flat bed without using bedrails?: A Lot Help needed moving to and from a bed to a chair (including a wheelchair)?: Total Help needed standing up from a chair using your arms (e.g., wheelchair or bedside chair)?: Total Help needed to walk in hospital room?: Total Help needed climbing 3-5 steps with a railing? : Total 6 Click Score: 8    End of Session   Activity Tolerance: Patient limited by fatigue;Patient limited by pain Patient left: with call bell/phone within reach;in chair;with chair alarm set Nurse Communication: Mobility status;Need for lift equipment (lift back to bed, NT aware) PT Visit Diagnosis: Other abnormalities of gait and mobility (R26.89);Muscle weakness (generalized) (M62.81);Other symptoms and signs involving the nervous system (R29.898)     Time: 6967-8938 PT Time Calculation (min) (ACUTE ONLY): 34 min  Charges:  $Therapeutic Activity: 23-37 mins                     Stacie Glaze, PT DPT Acute Rehabilitation Services Pager [phone]  Office [phone]    Roxine Caddy E Ruffin Pyo 04/27/2021, 5:02 PM

## 2021-04-27 NOTE — Progress Notes (Signed)
Inpatient Diabetes Program Recommendations  AACE/ADA: New Consensus Statement on Inpatient Glycemic Control (2015)  Target Ranges:  Prepandial:   less than 140 mg/dL      Peak postprandial:   less than 180 mg/dL (1-2 hours)      Critically ill patients:  140 - 180 mg/dL   Lab Results  Component Value Date   GLUCAP 106 (H) 04/27/2021   HGBA1C 6.8 (H) 04/25/2021    Review of Glycemic Control Results for JIANNI, BATTEN (MRN [id number]) as of 04/27/2021 12:08  Ref. Range 04/26/2021 16:23 04/26/2021 16:47 04/26/2021 21:50 04/27/2021 09:07  Glucose-Capillary Latest Ref Range: 70 - 99 mg/dL 68 (L) 92 119 (H) 106 (H)   Diabetes history: Type 2 DM Outpatient Diabetes medications: Metformin 500 mg BID, Glipizide 10 mg QD, Jardiance 10 mg QD, Novolog 0-10 units TID Current orders for Inpatient glycemic control: Novolog 0-20 units TID, Novolog 0-5 units QHS Decadron 10 mg x 1  Inpatient Diabetes Program Recommendations:    If to remain inpatient, consider decreasing correction to Novolog 0-9 units TID and discontinuing Glipizide.  Thanks, Bronson Curb, MSN, RNC-OB Diabetes Coordinator [phone] (8a-5p)

## 2021-04-28 LAB — GLUCOSE, CAPILLARY
Glucose-Capillary: 130 mg/dL — ABNORMAL HIGH (ref 70–99)
Glucose-Capillary: 157 mg/dL — ABNORMAL HIGH (ref 70–99)
Glucose-Capillary: 142 mg/dL — ABNORMAL HIGH (ref 70–99)
Glucose-Capillary: 112 mg/dL — ABNORMAL HIGH (ref 70–99)

## 2021-04-28 MED ORDER — HEPARIN SODIUM (PORCINE) 5000 UNIT/ML IJ SOLN
5000.0000 [IU] | Freq: Three times a day (TID) | INTRAMUSCULAR | Status: DC
  Administered 2021-04-28 – 2021-05-03 (×15): 5000 [IU] via SUBCUTANEOUS
  Filled 2021-04-28 (×15): qty 1

## 2021-04-28 NOTE — Evaluation (Signed)
Clinical/Bedside Swallow Evaluation Patient Details  Name: [name] MRN: [id number] Date of Birth: 1970/11/25  Today's Date: 04/28/2021 Time: SLP Start Time (ACUTE ONLY): 72 SLP Stop Time (ACUTE ONLY): 1247 SLP Time Calculation (min) (ACUTE ONLY): 16 min  Past Medical History:  Past Medical History:  Diagnosis Date   CHF (congestive heart failure) (HCC)    Coronary artery disease    Diabetes mellitus without complication ([place])    Ischemic cardiomyopathy    Myocardial infarction ([place])    inferior STEMI ~ 2016; 01/19/21 with CHB/cardiogenic shock requiring brief TVP, IABP, s/p aspiration thrombectomy/ballon angioplasty for late thrombosis mRCA stent & DES for acute occlusion mLCX   Past Surgical History:  Past Surgical History:  Procedure Laterality Date   AMPUTATION Right 10/01/2020   Procedure: AMPUTATION 5th  RAY AND IRRIGATION AND DEBRIDEMENT;  Surgeon: Samara Deist, DPM;  Location: [facility];  Service: Podiatry;  Laterality: Right;   AMPUTATION Right 10/11/2020   Procedure: AMPUTATION RAY;  Surgeon: Caroline More, DPM;  Location: [facility];  Service: Podiatry;  Laterality: Right;   ANTERIOR CERVICAL DECOMPRESSION/DISCECTOMY FUSION 4 LEVELS N/A 04/25/2021   Procedure: Cervical Four-Five, Cervical Five-Six, Cervical Six-Seven, Cervical Seven- Thoracic One  Anterior cervical decompression/Discectomy/fusion;  Surgeon: Judith Part, MD;  Location: [facility];  Service: Neurosurgery;  Laterality: N/A;  Cervical Four-Five, Cervical Five-Six, Cervical Six-Seven, Cervical Seven- Thoracic One  Anterior cervical decompression/Discectomy/fusion   BACK SURGERY     CARDIAC CATHETERIZATION     CORONARY THROMBECTOMY N/A 01/19/2021   Procedure: Coronary Thrombectomy;  Surgeon: Jettie Booze, MD;  Location: [facility];  Service: Cardiovascular;  Laterality: N/A;   CORONARY/GRAFT ACUTE MI REVASCULARIZATION N/A 01/19/2021   Procedure: Coronary/Graft Acute MI Revascularization;   Surgeon: Jettie Booze, MD;  Location: [place] [facility];  Service: Cardiovascular;  Laterality: N/A;   IABP INSERTION N/A 01/19/2021   Procedure: IABP Insertion;  Surgeon: Jettie Booze, MD;  Location: [facility];  Service: Cardiovascular;  Laterality: N/A;   INCISION AND DRAINAGE Right 09/29/2020   Procedure: INCISION AND DRAINAGE, RIGHT FOOT;  Surgeon: Samara Deist, DPM;  Location: [facility];  Service: Podiatry;  Laterality: Right;   LEFT HEART CATH AND CORONARY ANGIOGRAPHY N/A 01/19/2021   Procedure: LEFT HEART CATH AND CORONARY ANGIOGRAPHY;  Surgeon: Jettie Booze, MD;  Location: [facility];  Service: Cardiovascular;  Laterality: N/A;   LOWER EXTREMITY ANGIOGRAPHY Right 10/03/2020   Procedure: Lower Extremity Angiography;  Surgeon: Katha Cabal, MD;  Location: [place] [facility];  Service: Cardiovascular;  Laterality: Right;   TENDON LENGTHENING Right 10/11/2020   Procedure: TENDON LENGTHENING;  Surgeon: Caroline More, DPM;  Location: [facility];  Service: Podiatry;  Laterality: Right;   HPI:  [name] is a 50 y/o male admitted from [facility] 04/25/21 for C4-5, C5-6, C6-7, C7-T1 Anterior Cervical Discectomy and Instrumented Fusion. Cervical Spine xray (04/27/21) significant for "Increasing soft tissue swelling in the retropharyngeal region from C2 through C5, with narrowing of the supraglottic airway". 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   Assessment / Plan / Recommendation Clinical Impression  Pt alert and repositioned upright in bed for swallow evaluation. He reports that swallowing has been effortful since ACDF, but denies pain associated with PO intake. Oral mechanism examination  unremarkable and  pt is edentulous. Ice chips and thin liquids significant for multiple subswallows and intermittent coughing/throat clearing. Small controlled sips via cup and straw improved, though did not entirely eliminate, s/sx. Vocal quality remained clear throughout. Regular textured solid proved more effortful for pt to swallow which resulted in overt coughing and he ultimately required liquid wash to resolve. Imaging of cervical spine (9/9) indicates continued swelling from C2-C5, which is suspected to significantly impact swallow function at this time. Pt may remain on regular, thin liquid diet and recommend pt choose puree/smooth textured food. Pt demonstrates the ability to do this independently. Educated pt on taking small bites/sips and utilizing liquid wash as needed. SLP to f/u for continued treatment to assess swallow function with ongoing recovery from surgery. RN notified.  SLP Visit Diagnosis: Dysphagia, unspecified (R13.10)    Aspiration Risk  Mild aspiration risk    Diet Recommendation Regular;Thin liquid   Liquid Administration via: Cup;Straw Medication Administration: Whole meds with puree Supervision: Intermittent supervision to cue for compensatory strategies;Patient able to self feed Compensations: Minimize environmental distractions;Slow rate;Small sips/bites;Follow solids with liquid;Clear throat intermittently Postural Changes: Seated upright at 90 degrees    Other  Recommendations Oral Care Recommendations: Oral care BID   Follow up Recommendations Other (comment) (TBD)      Frequency and Duration min 2x/week  2 weeks       Prognosis Prognosis for Safe Diet Advancement: Fair Barriers to Reach Goals: Other (Comment) (Post OP healing)      Swallow Study   General Date of Onset: 04/28/21 HPI: [name] is a 50 y/o male admitted from [facility] 04/25/21 for C4-5, C5-6, C6-7, C7-T1 Anterior Cervical Discectomy and Instrumented Fusion. Cervical Spine xray (04/27/21)  significant for "Increasing soft tissue swelling in the retropharyngeal region from C2 through C5, with narrowing of the supraglottic airway". 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 Type of Study: Bedside Swallow Evaluation Previous Swallow Assessment: none per EMR Diet Prior to this Study: Regular;Thin liquids Temperature Spikes Noted: No Respiratory Status: Room air History of Recent Intubation: Yes Length of Intubations (days): 1 days (for duration of procedure) Date extubated: 04/25/21 Behavior/Cognition: Cooperative;Alert;Pleasant mood Oral Cavity Assessment: Within Functional Limits Oral Care Completed by SLP: No Oral Cavity - Dentition: Edentulous Vision: Functional for self-feeding Self-Feeding Abilities: Able to feed self Patient Positioning: Upright in bed;Postural control adequate for testing Baseline Vocal Quality: Normal Volitional Cough: Weak Volitional Swallow: Able to elicit    Oral/Motor/Sensory Function Overall Oral Motor/Sensory Function: Within functional limits   Ice Chips Ice chips: Impaired Presentation: Spoon Pharyngeal Phase Impairments: Throat Clearing - Immediate;Throat Clearing - Delayed;Multiple swallows   Thin Liquid Thin Liquid: Impaired Presentation: Straw;Cup Pharyngeal  Phase Impairments: Cough - Immediate;Throat Clearing - Delayed;Multiple swallows    Nectar Thick Nectar Thick Liquid: Not tested   Honey Thick Honey Thick Liquid: Not tested   Puree Puree: Impaired Presentation: Spoon Pharyngeal Phase Impairments: Multiple swallows;Throat Clearing - Delayed   Solid     Solid: Impaired Pharyngeal Phase Impairments: Multiple swallows;Cough - Immediate     Ellwood Dense, MA,  [place] Office Number: [phone]  Acie Fredrickson 04/28/2021,1:16 PM

## 2021-04-28 NOTE — Progress Notes (Signed)
Neurosurgery Service Progress Note  Subjective: No acute events overnight. Continued improvement in the BUE, BLE stable, swallowing improved on steroids - can now swallow liquids but not solids, no chest pain / SOB, no N/V or posterior chest / radiating chest pain  Objective: Vitals:   04/27/21 2024 04/27/21 2339 04/28/21 0258 04/28/21 0823  BP: 113/71 101/71 (!) 93/51 110/79  Pulse: 82 80 88 78  Resp: 18 18 18 18   Temp: 98.5 F (36.9 C) 98.1 F (36.7 C) 98 F (36.7 C) 98 F (36.7 C)  TempSrc: Oral Oral Oral   SpO2: 98% 95% 96% 98%  Weight:      Height:        Physical Exam: Strength 3/5 in BUE and BLE w/ inc'd tone, no hoffman's, reflexes 1+, b/l stocking-glove numbness Neck soft, incision c/di/  Assessment & Plan: 50 y.o. man s/p 4 level ACDF for severe cervical myelopathy, recovering well, XR C-spine w/ significant retropharyngeal edema  -continue [id number] given pt's cardiac risk -PT/OT rec'd SNF -advance diet as tolerated -AP/lateral xrays to eval for hardware and soft tissue edema -cont 3d course of dex for dysphagia, will need to stay inpt -activity as tolerated, no c-collar needed -PT/OT recs - return to SNF -SCDs/TEDs, SQH  Joyice Faster Tasnia Spegal  04/28/21 11:15 AM

## 2021-04-29 LAB — GLUCOSE, CAPILLARY
Glucose-Capillary: 134 mg/dL — ABNORMAL HIGH (ref 70–99)
Glucose-Capillary: 104 mg/dL — ABNORMAL HIGH (ref 70–99)
Glucose-Capillary: 59 mg/dL — ABNORMAL LOW (ref 70–99)
Glucose-Capillary: 106 mg/dL — ABNORMAL HIGH (ref 70–99)

## 2021-04-29 NOTE — Progress Notes (Signed)
Neurosurgery Service Progress Note  Subjective: No acute events overnight. Continued improvement in the BUE, BLE stable, swallowing continues to improve - ate some yogurt this morning w/o difficulty  Objective: Vitals:   04/28/21 2016 04/28/21 2336 04/29/21 0422 04/29/21 0900  BP: 111/75 (!) 79/55 115/80 111/80  Pulse: 86 77 69 80  Resp: 18 16 18 16   Temp: 97.6 F (36.4 C) 98.7 F (37.1 C) 97.7 F (36.5 C) 97.7 F (36.5 C)  TempSrc: Oral Oral Oral Oral  SpO2: 93% 94% 97% 98%  Weight:      Height:        Physical Exam: Strength 4-/5 in BUE and 3/5 BLE w/ inc'd tone, no hoffman's, reflexes 1+, b/l stocking-glove numbness Neck soft, incision c/di/  Assessment & Plan: 50 y.o. man s/p 4 level ACDF for severe cervical myelopathy, recovering well, XR C-spine w/ significant retropharyngeal edema  -continue [id number] given pt's cardiac risk -PT/OT rec'd SNF -SLP recs -cont 3d course of dex for dysphagia, will need to stay inpt -activity as tolerated, no c-collar needed -PT/OT recs - return to SNF, possibly tomorrow pending improvement in dysphagia -SCDs/TEDs, SQH  Joyice Faster Kimberlye Dilger  04/29/21 10:09 AM

## 2021-04-30 LAB — GLUCOSE, CAPILLARY
Glucose-Capillary: 149 mg/dL — ABNORMAL HIGH (ref 70–99)
Glucose-Capillary: 110 mg/dL — ABNORMAL HIGH (ref 70–99)
Glucose-Capillary: 50 mg/dL — ABNORMAL LOW (ref 70–99)
Glucose-Capillary: 97 mg/dL (ref 70–99)
Glucose-Capillary: 94 mg/dL (ref 70–99)

## 2021-04-30 NOTE — Progress Notes (Signed)
Inpatient Diabetes Program Recommendations  AACE/ADA: New Consensus Statement on Inpatient Glycemic Control (2015)  Target Ranges:  Prepandial:   less than 140 mg/dL      Peak postprandial:   less than 180 mg/dL (1-2 hours)      Critically ill patients:  140 - 180 mg/dL   Lab Results  Component Value Date   GLUCAP 110 (H) 04/30/2021   HGBA1C 6.8 (H) 04/25/2021    Review of Glycemic Control Results for [name], SALSGIVER (MRN [id number]) as of 04/30/2021 13:12  Ref. Range 04/29/2021 12:30 04/29/2021 17:23 04/29/2021 21:17 04/30/2021 09:35 04/30/2021 12:01  Glucose-Capillary Latest Ref Range: 70 - 99 mg/dL 104 (H) 59 (L) 106 (H) 149 (H) 110 (H)   Diabetes history: DM2 Outpatient Diabetes medications: Jardiance 10 mg daily, Glipizide 10 mg daily, Metformin 500 mg BID, Novolog 0-10 units TID with meals Current orders for Inpatient glycemic control: Novolog 0-20 units TID with meals, Novolog 0-5 units QHS, Metformin 500 mg BID, Glipizide 10 mg daily, Jardiance 10 mg daily  Inpatient Diabetes Program Recommendations:   Patient had hypoglycemia post correction on 04/29/21. Please consider: -Decrease Novolog correction to 0-9 units tid -D/C Glypizide Secure chat sent to Dr. Zada Finders.  Thank you, Nani Gasser. Omolola Mittman, RN, MSN, CDE  Diabetes Coordinator Inpatient Glycemic Control Team Team Pager [phone] (8am-5pm) 04/30/2021 1:14 PM

## 2021-04-30 NOTE — Progress Notes (Signed)
Physical Therapy Treatment Patient Details Name: [name] MRN: [id number] DOB: [birth year] Today's Date: 04/30/2021   History of Present Illness Jamare Vanatta is a 50 y/o male admitted from [facility] 04/25/21 for C4-5, C5-6, C6-7, C7-T1 Anterior Cervical Discectomy and Instrumented Fusion. 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 Pt requiring max +2 assist for transfer OOB to recliner via stedy. Pt with strong extensor tone in LE's, requiring sustained stretching into knee flexion at EOB and trunk flexion to break up tone. SNF remains appropriate d/c plan.     Recommendations for follow up therapy are one component of a multi-disciplinary discharge planning process, led by the attending physician.  Recommendations may be updated based on patient status, additional functional criteria and insurance authorization.  Follow Up Recommendations  SNF     Equipment Recommendations  None recommended by PT    Recommendations for Other Services       Precautions / Restrictions Precautions Precautions: Fall;Cervical Required Braces or Orthoses: Other Brace Other Brace: no brace needed per orders Restrictions Weight Bearing Restrictions: No     Mobility  Bed Mobility Overal bed mobility: Needs Assistance Bed Mobility: Rolling;Sidelying to Sit Rolling: Max assist Sidelying to sit: Max assist;+2 for physical assistance       General bed mobility comments: max assist +1-2 for trunk and LE management, log roll technique utilized for post-cervical op safety. Heavy posterior leaning at EOB requiring posterior support to correct.    Transfers Overall transfer level: Needs assistance Equipment used: Ambulation  equipment used Transfers: Sit to/from Stand Sit to Stand: Max assist;+2 physical assistance         General transfer comment: Max +2 for power up with bed pad, rise, steadying, supporting bodyweight in standing. STS from EOB x1, from stedy x2.  Ambulation/Gait             General Gait Details: unable   Stairs             Wheelchair Mobility    Modified Rankin (Stroke Patients Only)       Balance Overall balance assessment: Needs assistance Sitting-balance support: Single extremity supported;Feet supported Sitting balance-Leahy Scale: Poor Sitting balance - Comments: fair to poor, posterior leaning especially with fatigue Postural control: Posterior lean Standing balance support: Bilateral upper extremity supported Standing balance-Leahy Scale: Zero Standing balance comment: legs locked into extension, max facilitation for trunk rise                            Cognition Arousal/Alertness: Awake/alert Behavior During Therapy: WFL for tasks assessed/performed Overall Cognitive Status: No family/caregiver present to determine baseline cognitive functioning                                 General Comments: WFL command following, arousal, participation      Exercises General Exercises - Lower Extremity Heel Slides: AAROM;Both;Seated (sustained hold and pushing knees into extension, reaches 90* knee flexion bilat)    General Comments        Pertinent Vitals/Pain Pain Assessment: Faces Faces Pain Scale: Hurts little more Pain Location: incisional site, neck Pain Descriptors / Indicators: Grimacing;Discomfort Pain Intervention(s): Limited activity  within patient's tolerance;Monitored during session;Repositioned    Home Living                      Prior Function            PT Goals (current goals can now be found in the care plan section) Acute Rehab PT Goals Patient Stated Goal: To go to a more intense rehab and  walk again PT Goal Formulation: With patient Time For Goal Achievement: 05/10/21 Potential to Achieve Goals: Good Progress towards PT goals: Progressing toward goals    Frequency    Min 5X/week      PT Plan Current plan remains appropriate    Co-evaluation PT/OT/SLP Co-Evaluation/Treatment: Yes Reason for Co-Treatment: For patient/therapist safety;To address functional/ADL transfers;Complexity of the patient's impairments (multi-system involvement) PT goals addressed during session: Mobility/safety with mobility;Balance;Strengthening/ROM        AM-PAC PT "6 Clicks" Mobility   Outcome Measure  Help needed turning from your back to your side while in a flat bed without using bedrails?: A Lot Help needed moving from lying on your back to sitting on the side of a flat bed without using bedrails?: Total Help needed moving to and from a bed to a chair (including a wheelchair)?: Total Help needed standing up from a chair using your arms (e.g., wheelchair or bedside chair)?: Total Help needed to walk in hospital room?: Total Help needed climbing 3-5 steps with a railing? : Total 6 Click Score: 7    End of Session   Activity Tolerance: Patient limited by fatigue;Patient limited by pain Patient left: with call bell/phone within reach;in chair;with chair alarm set Nurse Communication: Mobility status;Need for lift equipment PT Visit Diagnosis: Other abnormalities of gait and mobility (R26.89);Muscle weakness (generalized) (M62.81);Other symptoms and signs involving the nervous system (R29.898)     Time: 1107-1140 PT Time Calculation (min) (ACUTE ONLY): 33 min  Charges:  $Therapeutic Activity: 8-22 mins                    Stacie Glaze, PT DPT Acute Rehabilitation Services Pager [phone]  Office [phone]   Roxine Caddy E Ruffin Pyo 04/30/2021, 12:10 PM

## 2021-04-30 NOTE — Progress Notes (Signed)
Neurosurgery Service Progress Note  Subjective: No acute events overnight. Continued improvement in the BUE / dysphagia  Objective: Vitals:   04/30/21 0500 04/30/21 0700 04/30/21 1202 04/30/21 2018  BP: 115/86 121/88 97/69 114/75  Pulse:  92 74 72  Resp:   14 16  Temp:  97.7 F (36.5 C) 97.7 F (36.5 C) 98.7 F (37.1 C)  TempSrc:  Oral Oral Oral  SpO2:  99% 98% 95%  Weight:      Height:        Physical Exam: Strength 4-/5 in BUE and 3/5 BLE w/ inc'd tone, no hoffman's, reflexes 1+, b/l stocking-glove numbness Neck soft, incision c/di/  Assessment & Plan: 50 y.o. man s/p 4 level ACDF for severe cervical myelopathy, recovering well, XR C-spine w/ significant retropharyngeal edema  -continue QUC75 given pt's cardiac risk -PT/OT rec'd SNF -SLP recs -okay for discharge back to SNF tomorrow 9/13 -SCDs/TEDs, [place]ord Lope  Judith Part  04/30/21 10:36 PM

## 2021-04-30 NOTE — Progress Notes (Signed)
Occupational Therapy Treatment Patient Details Name: [name] MRN: [id number] DOB: 10/21/1970 Today's Date: 04/30/2021   History of present illness [name] is a 50 y/o male admitted from [facility] 04/25/21 for C4-5, C5-6, C6-7, C7-T1 Anterior Cervical Discectomy and Instrumented Fusion. 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 Most success is getting him into a sitting position EOB and letting gravity "do the work" of bending his knees. Pt had success with self feeding today with proper set up , positioning, and adapted spoon. He will continue to benefit from acute OT with follow up at SNF.   Recommendations for follow up therapy are one component of a multi-disciplinary discharge planning process, led by the attending physician.  Recommendations may be updated based on patient status, additional functional criteria and insurance authorization.    Follow Up Recommendations  SNF;Supervision/Assistance - 24 hour    Equipment Recommendations  3 in 1 bedside commode;Tub/shower bench;Wheelchair (measurements OT);Wheelchair cushion (measurements OT)          Precautions / Restrictions Precautions Precautions: Fall;Cervical Required Braces or Orthoses: Other Brace Other Brace: no brace needed per orders Restrictions Weight Bearing Restrictions: No       Mobility Bed Mobility Overal bed mobility: Needs Assistance Bed Mobility: Rolling;Sidelying to Sit Rolling: Max assist Sidelying to sit: Max assist;+2 for physical assistance       General bed mobility comments: max assist +1-2 for trunk and LE management, log roll technique utilized for post-cervical op safety. Heavy  posterior leaning at EOB requiring posterior support to correct.    Transfers Overall transfer level: Needs assistance Equipment used: Ambulation equipment used Transfers: Sit to/from Stand Sit to Stand: Max assist;+2 physical assistance         General transfer comment: Max +2 for power up with bed pad, rise, steadying, supporting bodyweight in standing. STS from EOB x1, from stedy x2.    Balance Overall balance assessment: Needs assistance Sitting-balance support: Single extremity supported;Feet supported Sitting balance-Leahy Scale: Poor Sitting balance - Comments: fair to poor, posterior leaning especially with fatigue Postural control: Posterior lean Standing balance support: Bilateral upper extremity supported Standing balance-Leahy Scale: Zero Standing balance comment: legs locked into extension, max facilitation for trunk rise                           ADL either performed or assessed with clinical judgement   ADL Overall ADL's : Needs assistance/impaired Eating/Feeding: Supervision/ safety;Set up;Sitting Eating/Feeding Details (indicate cue type and reason): in recliner, arms propped on pillows (3 each side), tray table in front of him, spoon handle built up with washcloth. He was able to stablize pudding cup with left hand and scoop and bring to mouth pudding in right hand (increased effort and  time)                                         Vision Baseline Vision/History: 1 Wears glasses Ability to See in Adequate Light: 0 Adequate            Cognition Arousal/Alertness: Awake/alert Behavior During Therapy: [facility] for tasks assessed/performed Overall  Cognitive Status: Within Functional Limits for tasks assessed                                                            Pertinent Vitals/ Pain       Pain Assessment: Faces Faces Pain Scale: Hurts little more Pain Location: incisional site, neck Pain  Descriptors / Indicators: Grimacing;Discomfort Pain Intervention(s): Limited activity within patient's tolerance;Monitored during session;Repositioned                                                          Frequency  Min 2X/week        Progress Toward Goals  OT Goals(current goals can now be found in the care plan section)  Progress towards OT goals: Progressing toward goals (however tone in LEs in a huge contributing factor to limiting progress)  Acute Rehab OT Goals Patient Stated Goal: To go to a more intense rehab and walk again OT Goal Formulation: With patient Time For Goal Achievement: 05/10/21 Potential to Achieve Goals: Good  Plan Discharge plan remains appropriate;Frequency remains appropriate    Co-evaluation    PT/OT/SLP Co-Evaluation/Treatment: Yes (partial) Reason for Co-Treatment: Complexity of the patient's impairments (multi-system involvement);For patient/therapist safety PT goals addressed during session: Mobility/safety with mobility;Balance OT goals addressed during session: Strengthening/ROM;Proper use of Adaptive equipment and DME;ADL's and self-care      AM-PAC OT "6 Clicks" Daily Activity     Outcome Measure   Help from another person eating meals?: A Lot Help from another person taking care of personal grooming?: A Lot Help from another person toileting, which includes using toliet, bedpan, or urinal?: Total Help from another person bathing (including washing, rinsing, drying)?: A Lot Help from another person to put on and taking off regular upper body clothing?: Total Help from another person to put on and taking off regular lower body clothing?: Total 6 Click Score: 9    End of Session    OT Visit Diagnosis: Unsteadiness on feet (R26.81);Repeated falls (R29.6);Muscle weakness (generalized) (M62.81);History of falling (Z91.81);Other symptoms and signs involving the nervous system (R29.898);Pain Pain - part of body:   (incisional)   Activity Tolerance Patient tolerated treatment well   Patient Left in chair;with call bell/phone within reach;with chair alarm set   Nurse Communication Mobility status (how to set up pt to eat when he is in the recliner)        Time: 3734-2876 OT Time Calculation (min): 48 min  Charges: OT General Charges $OT Visit: 1 Visit OT Treatments $Self Care/Home Management : 23-37 mins  [place] Circle, OTR/L Acute [employer] Pager [phone] Office [phone]    Almon Register 04/30/2021, 9:18 PM

## 2021-04-30 NOTE — Progress Notes (Signed)
  Speech Language Pathology Treatment: Dysphagia  Patient Details Name: [name] MRN: [id number] DOB: 03-11-1971 Today's Date: 04/30/2021 Time: 8628-2417 SLP Time Calculation (min) (ACUTE ONLY): 18 min  Assessment / Plan / Recommendation Clinical Impression  Pt was seen for skilled observation of diet toleration. Pt reports no problems swallowing any consistencies from the day prior but reports significant difficulty with self-feeding. SLP trialed small and large sips of thin liquids and regular solids. Pt presents with slight hoarseness and intermittent cough at baseline. Oral phase was largely intact but pharyngeal phase c/b delayed and immediate cough, delayed throat clear with larger sips that were decreased with smaller sips. He does report coughing at baseline with mucous however if sips are large he has increased risk. Recommend continue with regular/thin diet using previously recommended techniques (small sips/bites, intermittent throat clear). Administer medications whole with puree. ST will sign off at this time. Please re-consult if difficulties.    HPI HPI: [name] is a 50 y/o male admitted from [facility] 04/25/21 for C4-5, C5-6, C6-7, C7-T1 Anterior Cervical Discectomy and Instrumented Fusion. Cervical Spine xray (04/27/21) significant for "Increasing soft tissue swelling in the retropharyngeal region from C2 through C5, with narrowing of the supraglottic airway". 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      SLP Plan  Continue with current plan of care       Recommendations  Diet recommendations: Regular;Thin liquid Liquids provided via: Straw Medication Administration: Whole meds with puree Supervision: Staff to  assist with self feeding Compensations: Minimize environmental distractions;Slow rate;Small sips/bites;Follow solids with liquid;Clear throat intermittently Postural Changes and/or Swallow Maneuvers: Seated upright 90 degrees                Oral Care Recommendations: Oral care BID Follow up Recommendations: Skilled Nursing facility SLP Visit Diagnosis: Dysphagia, unspecified (R13.10) Plan: Continue with current plan of care       GO             Dewitt Rota, SLP-Student    Dewitt Rota 04/30/2021, 9:55 AM

## 2021-05-01 LAB — GLUCOSE, CAPILLARY
Glucose-Capillary: 90 mg/dL (ref 70–99)
Glucose-Capillary: 107 mg/dL — ABNORMAL HIGH (ref 70–99)
Glucose-Capillary: 110 mg/dL — ABNORMAL HIGH (ref 70–99)
Glucose-Capillary: 96 mg/dL (ref 70–99)

## 2021-05-01 LAB — SARS CORONAVIRUS 2 (TAT 6-24 HRS): SARS Coronavirus 2: NEGATIVE

## 2021-05-01 MED ORDER — BACLOFEN 10 MG PO TABS
10.0000 mg | ORAL_TABLET | Freq: Three times a day (TID) | ORAL | Status: DC
  Administered 2021-05-01 – 2021-05-02 (×5): 10 mg via ORAL
  Filled 2021-05-01 (×5): qty 1

## 2021-05-01 MED ORDER — OXYCODONE HCL 5 MG PO TABS: 5.0000 mg | ORAL_TABLET | ORAL | 0 refills | Status: DC | PRN

## 2021-05-01 MED ORDER — BACLOFEN 5 MG PO TABS: 10.0000 mg | ORAL_TABLET | Freq: Three times a day (TID) | ORAL | 0 refills | Status: DC

## 2021-05-01 NOTE — Progress Notes (Signed)
Neurosurgery Service Progress Note  Subjective: No acute events overnight. Continued improvement in the BUE / dysphagia, feeling more stiff this morning in the BUE, stable BLE  Objective: Vitals:   04/30/21 1202 04/30/21 2018 05/01/21 0022 05/01/21 0404  BP: 97/69 114/75 117/75 114/64  Pulse: 74 72 70 75  Resp: 14 16 18    Temp: 97.7 F (36.5 C) 98.7 F (37.1 C) 98.6 F (37 C) 97.7 F (36.5 C)  TempSrc: Oral Oral Oral Oral  SpO2: 98% 95% 94%   Weight:      Height:        Physical Exam: Strength 4-/5 in BUE and 3/5 BLE w/ inc'd tone, no hoffman's, reflexes 1+, b/l stocking-glove numbness Neck soft, incision c/di/  Assessment & Plan: 50 y.o. man s/p 4 level ACDF for severe cervical myelopathy, recovering well, XR C-spine w/ significant retropharyngeal edema  -continue SUP10 -PT/OT rec'd SNF -SLP recs -okay for discharge back to SNF today -will increase his baclofen to help with the rigidity  -SCDs/TEDs, SQH  Judith Part  05/01/21 7:32 AM

## 2021-05-01 NOTE — TOC Progression Note (Signed)
Transition of Care [facility]) - Progression Note    Patient Details  Name: [name] MRN: [id number] Date of Birth: 1970/09/07  Transition of Care [facility]) CM/SW [place], [place] Phone Number: 05/01/2021, 12:32 PM  Clinical Narrative:     Leadership has approved short term rehab at [place]-  D/c pending - covid test results   Thurmond Butts, MSW, LCSW Clinical Social Worker     Expected Discharge Plan: [place] Barriers to Discharge: SNF Pending payor source - LOG  Expected Discharge Plan and Services Expected Discharge Plan: [place] In-house Referral: Clinical Social Work     Living arrangements for the past 2 months: [place] Expected Discharge Date: 05/01/21                                     Social Determinants of Health (SDOH) Interventions    Readmission Risk Interventions Readmission Risk Prevention Plan 01/30/2021  Post Dischage Appt Complete  Medication Screening Complete  Transportation Screening Complete  Some recent data might be hidden

## 2021-05-01 NOTE — TOC Initial Note (Signed)
Transition of Care [facility]) - Initial/Assessment Note    Patient Details  Name: [name] MRN: [id number] Date of Birth: June 17, 1971  Transition of Care [facility]) CM/SW Contact:    Vinie Sill, LCSW Phone Number: 05/01/2021, 11:58 AM  Clinical Narrative:                  CSW visit with patient at bedside. CSW introduced self and explained role. Patient confirmed he was at [facility] for short term rehab at [facility]. Patient remains agreeable to short term rehab and returning to [facility].   CSW spoke with [facility]/Kim- she advised patient has no payor sources and was there w/ LOG. She states the LOG has ended andc requested CSW get approval to have LOG extended. CSW sent message to [place] [facility] Director) and Passenger transport manager). CSW awaiting decision.  CSW will continue to follow and assist with discharge planning.   Thurmond Butts, MSW, LCSW Clinical Social Worker    Expected Discharge Plan: Skilled Nursing Facility Barriers to Discharge: SNF Pending payor source - LOG   Patient Goals and CMS Choice        Expected Discharge Plan and Services Expected Discharge Plan: [place] In-house Referral: Clinical Social Work     Living arrangements for the past 2 months: [place] Expected Discharge Date: 05/01/21                                    Prior Living Arrangements/Services Living arrangements for the past 2 months: [place]   Patient language and need for interpreter reviewed:: No        Need for Family Participation in Patient Care: Yes (Comment) Care giver support system in place?: Yes (comment)   Criminal Activity/Legal Involvement Pertinent to Current Situation/Hospitalization: No - Comment as needed  Activities of Daily Living Home Assistive Devices/Equipment: Environmental consultant (specify type), Wheelchair, CBG Meter ADL Screening (condition at time of admission) Patient's cognitive ability adequate  to safely complete daily activities?: Yes Is the patient deaf or have difficulty hearing?: No Does the patient have difficulty seeing, even when wearing glasses/contacts?: No Does the patient have difficulty concentrating, remembering, or making decisions?: No Patient able to express need for assistance with ADLs?: Yes Does the patient have difficulty dressing or bathing?: No Independently performs ADLs?: No Communication: Independent Dressing (OT): Needs assistance Is this a change from baseline?: Pre-admission baseline Grooming: Needs assistance Is this a change from baseline?: Pre-admission baseline Feeding: Needs assistance Is this a change from baseline?: Pre-admission baseline Bathing: Needs assistance Is this a change from baseline?: Pre-admission baseline Toileting: Needs assistance Is this a change from baseline?: Pre-admission baseline In/Out Bed: Needs assistance Is this a change from baseline?: Pre-admission baseline Walks in Home: Needs assistance Is this a change from baseline?: Pre-admission baseline Does the patient have difficulty walking or climbing stairs?: Yes Weakness of Legs: Both Weakness of Arms/Hands: Both  Permission Sought/Granted Permission sought to share information with : Family Supports Permission granted to share information with : Yes, Verbal Permission Granted  Share Information with NAME: Almira Bar  Permission granted to share info w AGENCY: SNF  Permission granted to share info w Relationship: Friend  Permission granted to share info w Contact Information: [phone]  Emotional Assessment Appearance:: Appears stated age   Affect (typically observed): Accepting, Pleasant Orientation: : Oriented to Self, Oriented to Place, Oriented to  Time, Oriented to  Situation Alcohol / Substance Use: Not Applicable Psych Involvement: No (comment)  Admission diagnosis:  Cervical myelopathy (HCC) [G95.9] Patient Active Problem List   Diagnosis  Date Noted   Ischemic cardiomyopathy 04/11/2021   Cervical spondylosis with myelopathy 04/11/2021   Physical deconditioning 01/30/2021   Weakness of both arms    Cervical stenosis of spine    Cervical myelopathy (HCC)    STEMI involving right coronary artery ([place]) 01/23/2021   Cardiogenic shock ([place]) 01/20/2021   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) 10/13/2020   Sepsis ([place]) 10/08/2020   Cellulitis in diabetic foot ([place]) 09/27/2020   Transaminitis 09/27/2020   Coronary artery disease involving native coronary artery of native heart without angina pectoris 12/06/2015   Uncontrolled type 2 diabetes mellitus with circulatory disorder, without long-term current use of insulin ([place]) 12/06/2015   Preventative health care 12/06/2015   Essential hypertension 12/06/2015   Hyperlipidemia 12/06/2015   Tobacco abuse 12/06/2015   PCP:  Coral Spikes, DO Pharmacy:   [facility] [address] [place] [address] [place] ([place])  [address] Phone: [phone] Fax: [phone]  [facility] [address] [place] [address] Phone: [phone] Fax: [phone]  [facility] [address] [place] [address] Phone: [phone] Fax: [phone]  [facility] [address] [place] [address] Phone: [phone] Fax: [phone]     Social Determinants of Health (SDOH) Interventions    Readmission Risk Interventions Readmission Risk Prevention Plan 01/30/2021  Post Dischage Appt Complete  Medication Screening Complete  Transportation Screening Complete  Some recent data might be hidden

## 2021-05-01 NOTE — Discharge Summary (Addendum)
Discharge Summary  Date of Admission: 04/25/2021  Date of Discharge: 05/02/21   Attending Physician: Emelda Brothers, MD  Hospital Course: Patient was admitted following an uncomplicated 4 level ACDF for cervical myelopathy. He was recovered in PACU and transferred to 4NP. Post-operatively, his myelopathy started to improve. He had post-operative dysphagia that required 3 days of steroids and rest but this too improved and he was able to resume his regular diet. His hospital course was otherwise uncomplicated and the patient was discharged back to SNF on 05/01/21. He will follow up in clinic with me in 2 weeks.  Neurologic exam at discharge:  Strength 4-/5 in BUE and 3/5 BLE w/ inc'd tone, no hoffman's, reflexes 1+, b/l stocking-glove numbness Neck soft, incision c/di/  Discharge diagnosis: Cervical myelopathy Allergies as of 05/01/2021   No Known Allergies      Medication List     STOP taking these medications    HYDROcodone-acetaminophen 5-325 MG tablet Commonly known as: NORCO/VICODIN       TAKE these medications    acetaminophen 325 MG tablet Commonly known as: TYLENOL Take 2 tablets (650 mg total) by mouth every 4 (four) hours as needed for mild pain, fever or headache.   albuterol 108 (90 Base) MCG/ACT inhaler Commonly known as: VENTOLIN HFA Inhale 2 puffs into the lungs every 6 (six) hours as needed for wheezing or shortness of breath.   aspirin 81 MG EC tablet Take 1 tablet (81 mg total) by mouth daily. Swallow whole.   atorvastatin 80 MG tablet Commonly known as: LIPITOR Take 1 tablet (80 mg total) by mouth daily.   Baclofen 5 MG Tabs Take 10 mg by mouth 3 (three) times daily. What changed: how much to take   Biofreeze 4 % Gel Generic drug: Menthol (Topical Analgesic) Apply 1 application topically every 8 (eight) hours as needed (pain).   bismuth subsalicylate 623 JS/28BT suspension Commonly known as: PEPTO BISMOL Take 30 mLs by mouth every 4 (four)  hours as needed for diarrhea or loose stools.   carvedilol 6.25 MG tablet Commonly known as: COREG Take 1 tablet (6.25 mg total) by mouth 2 (two) times daily with a meal.   collagenase ointment Commonly known as: SANTYL Apply 1 application topically every other day. Clean right foot wound with NS, pat dry and place collagen on wound bed then cover every other day and as needed.   empagliflozin 10 MG Tabs tablet Commonly known as: JARDIANCE Take 10 mg by mouth daily.   fluticasone 50 MCG/ACT nasal spray Commonly known as: FLONASE Place 2 sprays into both nostrils daily.   glipiZIDE 10 MG tablet Commonly known as: GLUCOTROL Take 10 mg by mouth daily.   Glucagon Emergency 1 MG Kit Inject 1 mg as directed as needed (blood glucose less than 70).   Insta-Glucose 77.4 % Gel Take 1 Dose by mouth as needed (for BG less than 70).   insulin aspart 100 UNIT/ML injection Commonly known as: novoLOG Inject 0-15 Units into the skin 3 (three) times daily with meals. Do NOT hold if patient is NPO. Moderate Scale.   loperamide 2 MG capsule Commonly known as: IMODIUM Take 2 mg by mouth every 8 (eight) hours as needed for diarrhea or loose stools.   losartan 25 MG tablet Commonly known as: COZAAR Take 0.5 tablets (12.5 mg total) by mouth at bedtime.   melatonin 3 MG Tabs tablet Take 1 tablet (3 mg total) by mouth at bedtime.   metFORMIN 500 MG tablet Commonly  known as: GLUCOPHAGE TAKE ONE TABLET BY MOUTH TWICE DAILY WITH A MEAL   nitroGLYCERIN 0.4 MG SL tablet Commonly known as: NITROSTAT Place 1 tablet (0.4 mg total) under the tongue every 5 (five) minutes x 3 doses as needed for chest pain.   oxyCODONE 5 MG immediate release tablet Commonly known as: Oxy IR/ROXICODONE Take 1 tablet (5 mg total) by mouth every 4 (four) hours as needed (pain).   polyethylene glycol 17 g packet Commonly known as: MIRALAX / GLYCOLAX Take 17 g by mouth daily as needed for moderate constipation or  severe constipation.   pregabalin 100 MG capsule Commonly known as: LYRICA Take 1 capsule (100 mg total) by mouth 3 (three) times daily.   senna-docusate 8.6-50 MG tablet Commonly known as: Senokot-S Take 2 tablets by mouth at bedtime as needed for mild constipation.   sodium chloride irrigation 0.9 % irrigation Irrigate with 1 application as directed every other day. Clean right foot wound with NS, pat dry and place collagen on wound bed then cover every other day and as needed.   ticagrelor 90 MG Tabs tablet Commonly known as: BRILINTA Take 1 tablet (90 mg total) by mouth 2 (two) times daily.   XEROFORM PETROLATUM DRESSING EX Place 1 application onto the skin as needed (wound care). Cleanse 2nd digit to left food with DWC, pat dry apply Xeroform and cover every other day and as needed for wound care               Discharge Care Instructions  (From admission, onward)           Start     Ordered   05/01/21 0000  Discharge wound care:       Comments: Discharge Instructions  No restriction in activities, slowly increase your activity back to normal.   Your incision is closed with dermabond (purple glue). This will naturally fall off over the next 1-2 weeks.   Okay to shower on the day of discharge. Use regular soap and water and try to be gentle when cleaning your incision.   Follow up with Dr. Zada Finders in 2 weeks after discharge. If you do not already have a discharge appointment, please call his office at [phone] to schedule a follow up appointment. If you have any concerns or questions, please call the office and let us know.   05/01/21 [place], MD 05/01/21 7:33 AM

## 2021-05-01 NOTE — Progress Notes (Signed)
Physical Therapy Treatment Patient Details Name: [name] MRN: [id number] DOB: Sep 03, 1970 Today's Date: 05/01/2021   History of Present Illness [name] is a 50 y/o male admitted from [facility] 04/25/21 for C4-5, C5-6, C6-7, C7-T1 Anterior Cervical Discectomy and Instrumented Fusion. 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 Pt requiring max-total assist for moving to/from EOB, but able to sit EOB with periods of supervision only. PT to continue to progress pt mobility as tolerated, plans to d/c to SNF today or tomorrow.     Recommendations for follow up therapy are one component of a multi-disciplinary discharge planning process, led by the attending physician.  Recommendations may be updated based on patient status, additional functional criteria and insurance authorization.  Follow Up Recommendations  SNF     Equipment Recommendations  None recommended by PT    Recommendations for Other Services       Precautions / Restrictions Precautions Precautions: Fall;Cervical Required Braces or Orthoses: Other Brace Other Brace: no brace needed per orders     Mobility  Bed Mobility Overal bed mobility: Needs Assistance Bed Mobility: Rolling;Sidelying to Sit;Sit to Sidelying Rolling: Max assist Sidelying to sit: Max assist     Sit to sidelying: Max assist;Total assist General bed mobility comments: max-total assist for log roll to and from EOB for all aspects, especially trunk rise/lower, LE lifting into/out of bed, and boost up in bed upon return to supine.    Transfers                 General transfer comment: nt - EOB  Session  Ambulation/Gait                 Stairs             Wheelchair Mobility    Modified Rankin (Stroke Patients Only)       Balance Overall balance assessment: Needs assistance Sitting-balance support: Feet supported;Single extremity supported Sitting balance-Leahy Scale: Fair Sitting balance - Comments: posterior leaning with fatigue, tolerates increasing dynamic balance challenges with single UE support. EOB sitting x15 mins Postural control: Posterior lean                                  Cognition Arousal/Alertness: Awake/alert Behavior During Therapy: WFL for tasks assessed/performed Overall Cognitive Status: Within Functional Limits for tasks assessed                                        Exercises Other Exercises Other Exercises: bilat CL reaching task in sitting, x5 each direction to work on scapular mobility and in funtional plane Other Exercises: Shoulder shrugs, first L then R, x10 Other Exercises: elbow flexion/extension with PT facilitation and quick stretch Other Exercises: shoulder abduction to 90*, bilat, x10, max PT assist to perform Other Exercises: shoulder rolls forward/back    General Comments        Pertinent Vitals/Pain Pain Assessment: Faces Faces Pain Scale: Hurts little more Pain Location: incisional site, neck Pain Descriptors / Indicators: Grimacing;Discomfort Pain Intervention(s): Limited activity within patient's tolerance;Monitored during  session;Repositioned    Home Living                      Prior Function            PT Goals (current goals can now be found in the care plan section) Acute Rehab PT Goals Patient Stated Goal: To go to a more intense rehab and walk again PT Goal Formulation: With patient Time For Goal Achievement: 05/10/21 Potential to Achieve Goals: Good Progress towards PT goals: Progressing toward goals    Frequency    Min 5X/week      PT  Plan Current plan remains appropriate    Co-evaluation              AM-PAC PT "6 Clicks" Mobility   Outcome Measure  Help needed turning from your back to your side while in a flat bed without using bedrails?: A Lot Help needed moving from lying on your back to sitting on the side of a flat bed without using bedrails?: Total Help needed moving to and from a bed to a chair (including a wheelchair)?: Total Help needed standing up from a chair using your arms (e.g., wheelchair or bedside chair)?: Total Help needed to walk in hospital room?: Total Help needed climbing 3-5 steps with a railing? : Total 6 Click Score: 7    End of Session   Activity Tolerance: Patient limited by fatigue;Patient limited by pain Patient left: with call bell/phone within reach;in bed;with bed alarm set Nurse Communication: Mobility status PT Visit Diagnosis: Other abnormalities of gait and mobility (R26.89);Muscle weakness (generalized) (M62.81);Other symptoms and signs involving the nervous system (R29.898)     Time: 2831-5176 PT Time Calculation (min) (ACUTE ONLY): 40 min  Charges:  $Therapeutic Exercise: 8-22 mins $Therapeutic Activity: 8-22 mins $Neuromuscular Re-education: 8-22 mins                    Stacie Glaze, PT DPT Acute Rehabilitation Services Pager [phone]  Office [phone]    Roxine Caddy E Ruffin Pyo 05/01/2021, 5:03 PM

## 2021-05-02 LAB — GLUCOSE, CAPILLARY
Glucose-Capillary: 116 mg/dL — ABNORMAL HIGH (ref 70–99)
Glucose-Capillary: 116 mg/dL — ABNORMAL HIGH (ref 70–99)
Glucose-Capillary: 97 mg/dL (ref 70–99)
Glucose-Capillary: 91 mg/dL (ref 70–99)

## 2021-05-02 MED ORDER — OXYCODONE HCL 5 MG PO TABS: 5.0000 mg | ORAL_TABLET | ORAL | 0 refills | Status: DC | PRN

## 2021-05-02 NOTE — Progress Notes (Signed)
Occupational Therapy Treatment Patient Details Name: [name] MRN: [id number] DOB: [birth year] Today's Date: 05/02/2021   History of present illness [name] is a 50 y/o male admitted from [facility] 04/25/21 for C4-5, C5-6, C6-7, C7-T1 Anterior Cervical Discectomy and Instrumented Fusion. 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   OT comments  Pt seen in conjunction with PT to maximize pts activity tolerance and optimize pts participation. Pt continues to present with decreased activity tolerance, increased pain and impaired strength and coordination in BUE/BLEs. Pt able to progress OOB to recliner this session with MAX A +2 to stand to stedy, total A stedy transfer from EOB>recliner. Pt completed below therex to increase AROM in BUEs. Issued pt build up handles for self feeding however pt still with limited AROM in BUEs. Pt would continue to benefit from skilled occupational therapy while admitted and after d/c to address the below listed limitations in order to improve overall functional mobility and facilitate independence with BADL participation. DC plan remains appropriate, will follow acutely per POC.     Recommendations for follow up therapy are one component of a multi-disciplinary discharge planning process, led by the attending physician.  Recommendations may be updated based on patient status, additional functional criteria and insurance authorization.    Follow Up Recommendations  SNF;Supervision/Assistance - 24 hour    Equipment Recommendations  3 in 1 bedside commode;Tub/shower bench;Wheelchair (measurements OT);Wheelchair cushion (measurements OT)    Recommendations for Other Services      Precautions / Restrictions Precautions Precautions: Fall;Cervical Precaution Booklet Issued: No Required  Braces or Orthoses: Other Brace Other Brace: no brace needed per orders Restrictions Weight Bearing Restrictions: No       Mobility Bed Mobility Overal bed mobility: Needs Assistance Bed Mobility: Rolling;Sidelying to Sit Rolling: Max assist Sidelying to sit: Total assist;+2 for physical assistance       General bed mobility comments: total +2 for trunk elevation and LE lowering over EOB, scooting to EOB. Posterior assist via COTA, PT facilitating LEs and hips anterior to pt.    Transfers Overall transfer level: Needs assistance Equipment used: Ambulation equipment used Transfers: Sit to/from Stand Sit to Stand: Mod assist;Max assist;+2 physical assistance         General transfer comment: max +2 assist for power up, rise, and steady with use of bed pads from EOB; mod assist for x2 stands from elevated stedy height. total A to pivot in stedy to recliner    Balance Overall balance assessment: Needs assistance Sitting-balance support: Feet supported;Single extremity supported Sitting balance-Leahy Scale: Fair Sitting balance - Comments: able to sit EOB without posterior sit after balance righting posteriorly. Pt able to sit EOB with hands on bilat knees with supervision Postural control: Posterior lean Standing balance support: During functional activity Standing balance-Leahy Scale: Zero Standing balance comment: reliant on stedy                           ADL either performed or assessed with clinical judgement   ADL Overall ADL's : Needs assistance/impaired                         Toilet Transfer: Maximal assistance;+2 for safety/equipment;Total assistance Toilet Transfer Details (indicate cue type and reason): MAXA +2 to stand in stedy; total A for transfer in stedy to recliner  Functional mobility during ADLs: Maximal assistance;+2 for physical assistance;+2 for safety/equipment (sit<>stand to stedy) General ADL Comments: pt continues  to present with increased pain, impaired coordination/ strength  in BUE/ BLEs     Vision       Perception     Praxis      Cognition Arousal/Alertness: Awake/alert Behavior During Therapy: WFL for tasks assessed/performed Overall Cognitive Status: Within Functional Limits for tasks assessed                                          Exercises General Exercises - Lower Extremity Heel Slides: AAROM;Both;Seated (knee flexion facilitated through feet unsupported on ground, PT providing gentle overpressure into knee flexion) Hip Flexion/Marching: AAROM;Both;10 reps;Seated (pt initiated hip flexion with PT facilitating completion of movement) Other Exercises Other Exercises: scapular protraction<>retraction with BUEs positoned on arm rests of chair with pt encouraged to shift trunk anteriorly<>posteriorly from chair Other Exercises: Shoulder shrugs, first L then R, x10 from bed level Other Exercises: modified push up with BUEs on stedy x10 reps   Shoulder Instructions       General Comments issued pt built up foam for utensils    Pertinent Vitals/ Pain       Pain Assessment: Faces Faces Pain Scale: Hurts little more Pain Location: neck, L shoulder Pain Descriptors / Indicators: Grimacing;Discomfort;Sore Pain Intervention(s): Limited activity within patient's tolerance;Monitored during session;Repositioned  Home Living                                          Prior Functioning/Environment              Frequency  Min 2X/week        Progress Toward Goals  OT Goals(current goals can now be found in the care plan section)  Progress towards OT goals: Progressing toward goals  Acute Rehab OT Goals Patient Stated Goal: To go to a more intense rehab and walk again OT Goal Formulation: With patient Time For Goal Achievement: 05/10/21 Potential to Achieve Goals: Good  Plan Discharge plan remains appropriate;Frequency remains appropriate     Co-evaluation      Reason for Co-Treatment: For patient/therapist safety;To address functional/ADL transfers;Complexity of the patient's impairments (multi-system involvement) PT goals addressed during session: Mobility/safety with mobility;Balance;Strengthening/ROM OT goals addressed during session: ADL's and self-care;Strengthening/ROM      AM-PAC OT "6 Clicks" Daily Activity     Outcome Measure   Help from another person eating meals?: A Lot Help from another person taking care of personal grooming?: A Lot Help from another person toileting, which includes using toliet, bedpan, or urinal?: Total Help from another person bathing (including washing, rinsing, drying)?: A Lot Help from another person to put on and taking off regular upper body clothing?: Total Help from another person to put on and taking off regular lower body clothing?: Total 6 Click Score: 9    End of Session Equipment Utilized During Treatment: Gait belt;Other (comment) (stedy)  OT Visit Diagnosis: Unsteadiness on feet (R26.81);Repeated falls (R29.6);Muscle weakness (generalized) (M62.81);History of falling (Z91.81);Other symptoms and signs involving the nervous system (R29.898);Pain Pain - Right/Left: Left Pain - part of body:  (shoulder/ back)   Activity Tolerance Patient tolerated treatment well   Patient Left in chair;with call bell/phone within reach;with chair alarm set;Other (  comment) (lift pad underneath pt)   Nurse Communication Mobility status;Need for lift equipment        Time: 4103-0131 OT Time Calculation (min): 23 min  Charges: OT General Charges $OT Visit: 1 Visit OT Treatments $Therapeutic Activity: 8-22 mins  Harley Alto., COTA/L Acute Rehabilitation Services [place] 05/02/2021, 11:50 AM

## 2021-05-02 NOTE — Progress Notes (Signed)
Neurosurgery Service Progress Note  Subjective: No acute events overnight. Working w/ PT this morning, no new complaints  Objective: Vitals:   05/01/21 1538 05/01/21 1936 05/01/21 2254 05/02/21 0404  BP: 110/81 105/66 90/63 96/70   Pulse: 74 73 67 73  Resp: 16 16 16 15   Temp: 98 F (36.7 C) 97.8 F (36.6 C) 98.3 F (36.8 C) 98 F (36.7 C)  TempSrc: Oral Oral Oral Oral  SpO2: 99% 100% 100% 99%  Weight:      Height:        Physical Exam: Strength 4-/5 in BUE and 3/5 BLE w/ inc'd tone, no hoffman's, reflexes 1+, b/l stocking-glove numbness Neck soft, incision c/di/  Assessment & Plan: 50 y.o. man s/p 4 level ACDF for severe cervical myelopathy, recovering well, XR C-spine w/ significant retropharyngeal edema  -continue CEQ33 -PT/OT rec'd SNF -SLP recs -okay for discharge back to SNF today -SCDs/TEDs, SQH  Judith Part  05/02/21 9:50 AM

## 2021-05-02 NOTE — TOC Progression Note (Addendum)
Transition of Care [facility]) - Progression Note    Patient Details  Name: [name] MRN: [id number] Date of Birth: 1971-06-24  Transition of Care [facility]) CM/SW [place], [place] Phone Number: 05/02/2021, 11:42 AM  Clinical Narrative:     Patient will Discharge to: [facility] Discharge Date: 05/02/2021 Family Notified:Leasia-relative-left voice message  Transport By: Larence Penning Transport -arranged pick up by Corey Harold  Per MD patient is ready for discharge. RN, patient, and facility notified of discharge. Discharge Summary sent to facility. RN given number for [phone]. Ambulance transport requested for patient.   Clinical Social Worker signing off.  Thurmond Butts, MSW, LCSW Clinical Social Worker     Expected Discharge Plan: Skilled Nursing Facility Barriers to Discharge: SNF Pending payor source - LOG  Expected Discharge Plan and Services Expected Discharge Plan: [place] In-house Referral: Clinical Social Work     Living arrangements for the past 2 months: [place] Expected Discharge Date: 05/01/21                                     Social Determinants of Health (SDOH) Interventions    Readmission Risk Interventions Readmission Risk Prevention Plan 01/30/2021  Post Dischage Appt Complete  Medication Screening Complete  Transportation Screening Complete  Some recent data might be hidden

## 2021-05-02 NOTE — Progress Notes (Signed)
Physical Therapy Treatment Patient Details Name: [name] MRN: [id number] DOB: [birth year] Today's Date: 05/02/2021   History of Present Illness [name] is a 50 y/o male admitted from [facility] 04/25/21 for C4-5, C5-6, C6-7, C7-T1 Anterior Cervical Discectomy and Instrumented Fusion. 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    PT Comments     Pt reports poor night of sleep and moderate L shoulder/neck pain, but agreeable to OOB mobility. Pt requiring max-total +2 assist to reach EOB today, limited by debility and LE hypertonicity. Pt tolerated repeated stands with use of stedy, requires multimodal cuing for postural engagement and upright posture. Will continue to follow.     Recommendations for follow up therapy are one component of a multi-disciplinary discharge planning process, led by the attending physician.  Recommendations may be updated based on patient status, additional functional criteria and insurance authorization.  Follow Up Recommendations  SNF     Equipment Recommendations  None recommended by PT    Recommendations for Other Services       Precautions / Restrictions Precautions Precautions: Fall;Cervical Precaution Booklet Issued: No Required Braces or Orthoses: Other Brace Other Brace: no brace needed per orders Restrictions Weight Bearing Restrictions: No     Mobility  Bed Mobility Overal bed mobility: Needs Assistance Bed Mobility: Rolling;Sidelying to Sit Rolling: Max assist Sidelying to sit: Total assist;+2 for physical assistance       General bed mobility comments: total +2 for trunk elevation and LE lowering over EOB, scooting to EOB. Posterior assist via COTA, PT facilitating LEs and hips anterior to pt.    Transfers Overall transfer level: Needs  assistance Equipment used: Ambulation equipment used Transfers: Sit to/from Stand Sit to Stand: Mod assist;Max assist;+2 physical assistance         General transfer comment: max +2 assist for power up, rise, and steady with use of bed pads from EOB; mod assist for x2 stands from elevated stedy height.  Ambulation/Gait                 Stairs             Wheelchair Mobility    Modified Rankin (Stroke Patients Only)       Balance Overall balance assessment: Needs assistance Sitting-balance support: Feet supported;Single extremity supported Sitting balance-Leahy Scale: Fair Sitting balance - Comments: able to sit EOB without posterior sit after balance righting posteriorly. Pt able to sit EOB with hands on bilat knees with supervision Postural control: Posterior lean Standing balance support: During functional activity Standing balance-Leahy Scale: Zero Standing balance comment: reliant on stedy                            Cognition Arousal/Alertness: Awake/alert Behavior During Therapy: WFL for tasks assessed/performed Overall Cognitive Status: Within Functional Limits for tasks assessed                                        Exercises General Exercises - Lower Extremity Heel Slides: AAROM;Both;Seated (knee flexion facilitated through feet unsupported on ground, PT providing gentle overpressure into knee flexion) Hip Flexion/Marching: AAROM;Both;10 reps;Seated (pt initiated hip flexion with PT facilitating completion of movement)    General Comments        Pertinent Vitals/Pain  Pain Assessment: Faces Faces Pain Scale: Hurts little more Pain Location: neck, L shoulder Pain Descriptors / Indicators: Grimacing;Discomfort;Sore Pain Intervention(s): Limited activity within patient's tolerance;Monitored during session;Repositioned    Home Living                      Prior Function            PT Goals (current goals  can now be found in the care plan section) Acute Rehab PT Goals Patient Stated Goal: To go to a more intense rehab and walk again PT Goal Formulation: With patient Time For Goal Achievement: 05/10/21 Potential to Achieve Goals: Good Progress towards PT goals: Progressing toward goals    Frequency    Min 5X/week      PT Plan Current plan remains appropriate    Co-evaluation PT/OT/SLP Co-Evaluation/Treatment: Yes Reason for Co-Treatment: For patient/therapist safety;To address functional/ADL transfers;Complexity of the patient's impairments (multi-system involvement) PT goals addressed during session: Mobility/safety with mobility;Balance;Strengthening/ROM        AM-PAC PT "6 Clicks" Mobility   Outcome Measure  Help needed turning from your back to your side while in a flat bed without using bedrails?: A Lot Help needed moving from lying on your back to sitting on the side of a flat bed without using bedrails?: Total Help needed moving to and from a bed to a chair (including a wheelchair)?: Total Help needed standing up from a chair using your arms (e.g., wheelchair or bedside chair)?: Total Help needed to walk in hospital room?: Total Help needed climbing 3-5 steps with a railing? : Total 6 Click Score: 7    End of Session   Activity Tolerance: Patient limited by fatigue;Patient limited by pain Patient left: with call bell/phone within reach;in bed;with bed alarm set Nurse Communication: Mobility status PT Visit Diagnosis: Other abnormalities of gait and mobility (R26.89);Muscle weakness (generalized) (M62.81);Other symptoms and signs involving the nervous system (R29.898)     Time: 9983-3825 PT Time Calculation (min) (ACUTE ONLY): 28 min  Charges:  $Therapeutic Activity: 8-22 mins                     Stacie Glaze, PT DPT Acute Rehabilitation Services Pager [phone]  Office [phone]   [name] 05/02/2021, 10:27 AM

## 2021-05-03 NOTE — Plan of Care (Signed)
  Problem: Elimination: Goal: Will not experience complications related to bowel motility Outcome: Adequate for Discharge Goal: Will not experience complications related to urinary retention Outcome: Adequate for Discharge   Problem: Pain Managment: Goal: General experience of comfort will improve Outcome: Adequate for Discharge   Problem: Safety: Goal: Ability to remain free from injury will improve Outcome: Adequate for Discharge   Problem: Skin Integrity: Goal: Risk for impaired skin integrity will decrease Outcome: Adequate for Discharge

## 2021-05-03 NOTE — Progress Notes (Signed)
Pt left with [facility] for transfer back to [name]. VS: T 97.7, BP 110/70, HR 68, O2 96%, RR 20. Pt left via ambulance via stretcher. Report called to [place], Therapist, sports. She stated Dr. Heber [place] would receive the pt and review chart.

## 2021-05-07 NOTE — Telephone Encounter (Signed)
Attempted to reach pt, third attempt.  No answer. Unable to leave vm on all numbers listed.  Mailed letter to pt asking that he call our office to discuss results.

## 2021-05-08 ENCOUNTER — Ambulatory Visit: Payer: Self-pay

## 2021-05-08 ENCOUNTER — Telehealth: Payer: Self-pay | Admitting: Internal Medicine

## 2021-05-08 ENCOUNTER — Other Ambulatory Visit: Payer: Self-pay

## 2021-05-08 DIAGNOSIS — I2510 Atherosclerotic heart disease of native coronary artery without angina pectoris: Secondary | ICD-10-CM

## 2021-05-08 NOTE — Telephone Encounter (Signed)
Please advise if complete echo should be ordered since pt unable to have limited echo in office.

## 2021-05-08 NOTE — Telephone Encounter (Signed)
It is okay to order a complete echo so that it can be performed in the appropriate setting with a lift available.  Nelva Bush, MD [facility]

## 2021-05-08 NOTE — Telephone Encounter (Signed)
Patient unable to do echo testing in office due to need for lift.  Patient can be scheduled at [place] but the order will need to be a complete echo as they do not do a limited.    Please advise if this can be changed.    [facility] can be call to reschedule at [place] only [phone]

## 2021-05-09 NOTE — Telephone Encounter (Signed)
Complete echo has been ordered to be done at [facility].  May call pt to schedule. Thank you.

## 2021-05-21 ENCOUNTER — Ambulatory Visit
Admission: RE | Admit: 2021-05-21 | Discharge: 2021-05-21 | Disposition: A | Discharge status: Home / Self Care | Payer: Medicaid Other | Source: Ambulatory Visit | Attending: Internal Medicine | Admitting: Internal Medicine

## 2021-05-21 ENCOUNTER — Other Ambulatory Visit: Payer: Self-pay

## 2021-05-21 DIAGNOSIS — E119 Type 2 diabetes mellitus without complications: Secondary | ICD-10-CM | POA: Diagnosis not present

## 2021-05-21 DIAGNOSIS — I2510 Atherosclerotic heart disease of native coronary artery without angina pectoris: Secondary | ICD-10-CM | POA: Diagnosis not present

## 2021-05-21 DIAGNOSIS — I219 Acute myocardial infarction, unspecified: Secondary | ICD-10-CM | POA: Diagnosis not present

## 2021-05-21 DIAGNOSIS — I255 Ischemic cardiomyopathy: Secondary | ICD-10-CM | POA: Diagnosis not present

## 2021-05-21 LAB — ECHOCARDIOGRAM COMPLETE: S' Lateral: 3.41 cm

## 2021-05-21 NOTE — Progress Notes (Signed)
*  PRELIMINARY RESULTS* Echocardiogram 2D Echocardiogram has been performed.  [name] 05/21/2021, 11:38 AM

## 2021-05-22 ENCOUNTER — Ambulatory Visit (INDEPENDENT_AMBULATORY_CARE_PROVIDER_SITE_OTHER): Payer: Self-pay | Admitting: Diagnostic Neuroimaging

## 2021-05-22 ENCOUNTER — Encounter: Payer: Self-pay | Admitting: Diagnostic Neuroimaging

## 2021-05-22 VITALS — BP 73/48 | HR 82 | Ht 74.0 in

## 2021-05-22 DIAGNOSIS — G959 Disease of spinal cord, unspecified: Secondary | ICD-10-CM

## 2021-05-22 NOTE — Progress Notes (Signed)
[facility]  PATIENT: [name] DOB: March 13, 1971  REFERRING CLINICIAN: Lorenza Chick, MD HISTORY FROM: patient  REASON FOR VISIT: new consult    HISTORICAL  CHIEF COMPLAINT:  Chief Complaint  Patient presents with   Extremity Weakness    Rm 6 alone  Pt is having weakness in both arms and L leg after falling and having a heart attack June 5th.     HISTORY OF PRESENT ILLNESS:   50 year old male here for evaluation of upper and lower extremity weakness.  Symptoms started in June 2022 when he had a heart attack and fell down.  Patient was admitted to the hospital.  He was found to have a few punctate ischemic infarctions following cardiac catheterization.  MRI cervical spine demonstrated severe cervical spinal stenosis and myelopathy.  He underwent surgical decompression in September 2022.  Since that time he has been in skilled nursing facility.  His upper and lower extremity strength have slightly improved. History of diabetes, coronary artery disease.  REVIEW OF SYSTEMS: Full 14 system review of systems performed and negative with exception of: as per HPI.   ALLERGIES: No Known Allergies  HOME MEDICATIONS: Outpatient Medications Prior to Visit  Medication Sig Dispense Refill   acetaminophen (TYLENOL) 325 MG tablet Take 2 tablets (650 mg total) by mouth every 4 (four) hours as needed for mild pain, fever or headache.     albuterol (VENTOLIN HFA) 108 (90 Base) MCG/ACT inhaler Inhale 2 puffs into the lungs every 6 (six) hours as needed for wheezing or shortness of breath. 8 g 2   aspirin EC 81 MG EC tablet Take 1 tablet (81 mg total) by mouth daily. Swallow whole. 30 tablet 11   atorvastatin (LIPITOR) 80 MG tablet Take 1 tablet (80 mg total) by mouth daily.     Baclofen 5 MG TABS Take 10 mg by mouth 3 (three) times daily. 30 tablet 0   bismuth subsalicylate (PEPTO BISMOL) 262 MG/15ML suspension Take 30 mLs by mouth every 4 (four) hours as needed for  diarrhea or loose stools.     Bismuth Tribromoph-Petrolatum (XEROFORM PETROLATUM DRESSING EX) Place 1 application onto the skin as needed (wound care). Cleanse 2nd digit to left food with DWC, pat dry apply Xeroform and cover every other day and as needed for wound care     carvedilol (COREG) 6.25 MG tablet Take 1 tablet (6.25 mg total) by mouth 2 (two) times daily with a meal.     collagenase (SANTYL) ointment Apply 1 application topically every other day. Clean right foot wound with NS, pat dry and place collagen on wound bed then cover every other day and as needed.     Dextrose, Diabetic Use, (INSTA-GLUCOSE) 77.4 % GEL Take 1 Dose by mouth as needed (for BG less than 70).     empagliflozin (JARDIANCE) 10 MG TABS tablet Take 10 mg by mouth daily.     fluticasone (FLONASE) 50 MCG/ACT nasal spray Place 2 sprays into both nostrils daily. 9.9 mL 2   glipiZIDE (GLUCOTROL) 10 MG tablet Take 10 mg by mouth daily.     Glucagon, rDNA, (GLUCAGON EMERGENCY) 1 MG KIT Inject 1 mg as directed as needed (blood glucose less than 70).     insulin aspart (NOVOLOG) 100 UNIT/ML injection Inject 0-15 Units into the skin 3 (three) times daily with meals. Do NOT hold if patient is NPO. Moderate Scale. 10 mL 11   loperamide (IMODIUM) 2 MG capsule Take 2 mg by mouth  every 8 (eight) hours as needed for diarrhea or loose stools.     losartan (COZAAR) 25 MG tablet Take 0.5 tablets (12.5 mg total) by mouth at bedtime.     melatonin 3 MG TABS tablet Take 1 tablet (3 mg total) by mouth at bedtime.  0   Menthol, Topical Analgesic, (BIOFREEZE) 4 % GEL Apply 1 application topically every 8 (eight) hours as needed (pain).     metFORMIN (GLUCOPHAGE) 500 MG tablet TAKE ONE TABLET BY MOUTH TWICE DAILY WITH A MEAL 30 tablet 0   nitroGLYCERIN (NITROSTAT) 0.4 MG SL tablet Place 1 tablet (0.4 mg total) under the tongue every 5 (five) minutes x 3 doses as needed for chest pain.  12   oxyCODONE (OXY IR/ROXICODONE) 5 MG immediate release  tablet Take 1 tablet (5 mg total) by mouth every 4 (four) hours as needed (pain). 30 tablet 0   polyethylene glycol (MIRALAX / GLYCOLAX) 17 g packet Take 17 g by mouth daily as needed for moderate constipation or severe constipation. 14 each 0   pregabalin (LYRICA) 100 MG capsule Take 1 capsule (100 mg total) by mouth 3 (three) times daily. 60 capsule 0   senna-docusate (SENOKOT-S) 8.6-50 MG tablet Take 2 tablets by mouth at bedtime as needed for mild constipation.     sodium chloride irrigation 0.9 % irrigation Irrigate with 1 application as directed every other day. Clean right foot wound with NS, pat dry and place collagen on wound bed then cover every other day and as needed.     ticagrelor (BRILINTA) 90 MG TABS tablet Take 1 tablet (90 mg total) by mouth 2 (two) times daily. 60 tablet 12   No facility-administered medications prior to visit.    PAST MEDICAL HISTORY: Past Medical History:  Diagnosis Date   CHF (congestive heart failure) ([place])    Coronary artery disease    Diabetes mellitus without complication ([place])    Ischemic cardiomyopathy    Myocardial infarction ([place])    inferior STEMI ~ 2016; 01/19/21 with CHB/cardiogenic shock requiring brief TVP, IABP, s/p aspiration thrombectomy/ballon angioplasty for late thrombosis mRCA stent & DES for acute occlusion mLCX    PAST SURGICAL HISTORY: Past Surgical History:  Procedure Laterality Date   AMPUTATION Right 10/01/2020   Procedure: AMPUTATION 5th  RAY AND IRRIGATION AND DEBRIDEMENT;  Surgeon: Samara Deist, DPM;  Location: [facility];  Service: Podiatry;  Laterality: Right;   AMPUTATION Right 10/11/2020   Procedure: AMPUTATION RAY;  Surgeon: Caroline More, DPM;  Location: [facility];  Service: Podiatry;  Laterality: Right;   ANTERIOR CERVICAL DECOMPRESSION/DISCECTOMY FUSION 4 LEVELS N/A 04/25/2021   Procedure: Cervical Four-Five, Cervical Five-Six, Cervical Six-Seven, Cervical Seven- Thoracic One  Anterior cervical  decompression/Discectomy/fusion;  Surgeon: Judith Part, MD;  Location: [place];  Service: Neurosurgery;  Laterality: N/A;  Cervical Four-Five, Cervical Five-Six, Cervical Six-Seven, Cervical Seven- Thoracic One  Anterior cervical decompression/Discectomy/fusion   BACK SURGERY     CARDIAC CATHETERIZATION     CORONARY THROMBECTOMY N/A 01/19/2021   Procedure: Coronary Thrombectomy;  Surgeon: Jettie Booze, MD;  Location: [place] [facility];  Service: Cardiovascular;  Laterality: N/A;   CORONARY/GRAFT ACUTE MI REVASCULARIZATION N/A 01/19/2021   Procedure: Coronary/Graft Acute MI Revascularization;  Surgeon: Jettie Booze, MD;  Location: [place] [facility];  Service: Cardiovascular;  Laterality: N/A;   IABP INSERTION N/A 01/19/2021   Procedure: IABP Insertion;  Surgeon: Jettie Booze, MD;  Location: [place] [facility];  Service: Cardiovascular;  Laterality: N/A;  INCISION AND DRAINAGE Right 09/29/2020   Procedure: INCISION AND DRAINAGE, RIGHT FOOT;  Surgeon: Samara Deist, DPM;  Location: [facility];  Service: Podiatry;  Laterality: Right;   LEFT HEART CATH AND CORONARY ANGIOGRAPHY N/A 01/19/2021   Procedure: LEFT HEART CATH AND CORONARY ANGIOGRAPHY;  Surgeon: Jettie Booze, MD;  Location: [place] [facility];  Service: Cardiovascular;  Laterality: N/A;   LOWER EXTREMITY ANGIOGRAPHY Right 10/03/2020   Procedure: Lower Extremity Angiography;  Surgeon: Katha Cabal, MD;  Location: [place] [facility];  Service: Cardiovascular;  Laterality: Right;   TENDON LENGTHENING Right 10/11/2020   Procedure: TENDON LENGTHENING;  Surgeon: Caroline More, DPM;  Location: [facility];  Service: Podiatry;  Laterality: Right;    FAMILY HISTORY: Family History  Problem Relation Age of Onset   Breast cancer Mother    Heart attack Father    Diabetes Father    Heart disease Father    Lung cancer Paternal Grandmother     SOCIAL HISTORY: Social History   Socioeconomic  History   Marital status: Single    Spouse name: Not on file   Number of children: Not on file   Years of education: Not on file   Highest education level: Not on file  Occupational History   Not on file  Tobacco Use   Smoking status: Every Day    Packs/day: 1.00    Years: 33.00    Pack years: 33.00    Types: Cigarettes   Smokeless tobacco: Never  Substance and Sexual Activity   Alcohol use: Not Currently    Alcohol/week: 0.0 - 1.0 standard drinks   Drug use: No   Sexual activity: Not on file  Other Topics Concern   Not on file  Social History Narrative   Not on file   Social Determinants of Health   Financial Resource Strain: Not on file  Food Insecurity: Not on file  Transportation Needs: Not on file  Physical Activity: Not on file  Stress: Not on file  Social Connections: Not on file  Intimate Partner Violence: Not on file     PHYSICAL EXAM  GENERAL EXAM/CONSTITUTIONAL: Vitals:  Vitals:   05/22/21 1316  BP: (!) 73/48  Pulse: 82  Height: 6' 2"  (1.88 m)   Body mass index is 26.83 kg/m. Wt Readings from Last 3 Encounters:  04/25/21 209 lb (94.8 kg)  04/11/21 209 lb (94.8 kg)  01/28/21 225 lb 12 oz (102.4 kg)   Patient is in no distress; well developed, nourished and groomed; neck is supple  CARDIOVASCULAR: Examination of carotid arteries is normal; no carotid bruits Regular rate and rhythm, no murmurs Examination of peripheral vascular system by observation and palpation is normal  EYES: Ophthalmoscopic exam of optic discs and posterior segments is normal; no papilledema or hemorrhages No results found.  MUSCULOSKELETAL: Gait, strength, tone, movements noted in Neurologic exam below  NEUROLOGIC: MENTAL STATUS:  No flowsheet data found. awake, alert, oriented to person, place and time recent and remote memory intact normal attention and concentration language fluent, comprehension intact, naming intact fund of knowledge appropriate  CRANIAL  NERVE:  2nd - no papilledema on fundoscopic exam 2nd, 3rd, 4th, 6th - pupils equal and reactive to light, visual fields full to confrontation, extraocular muscles intact, no nystagmus 5th - facial sensation symmetric 7th - facial strength symmetric 8th - hearing intact 9th - palate elevates symmetrically, uvula midline 11th - shoulder shrug symmetric 12th - tongue protrusion midline  MOTOR:  normal bulk and tone  BUE 3-4 PROX, 4 DISTAL (LEFT WEAKER THAN RIGHT) BLE 3-4 PROX, 3 DISTAL; RIGHT FOOT AMPUTATION  SENSORY:  normal and symmetric to light touch  COORDINATION:  finger-nose-finger, fine finger movements normal  REFLEXES:  deep tendon reflexes --> BUE 2, KNEES TRACE, ANKLES TRACE   GAIT/STATION:  IN WHEELCHAIR     DIAGNOSTIC DATA (LABS, IMAGING, TESTING) - I reviewed patient records, labs, notes, testing and imaging myself where available.  Lab Results  Component Value Date   WBC 10.3 04/25/2021   HGB 14.2 04/25/2021   HCT 43.8 04/25/2021   MCV 98.4 04/25/2021   PLT 175 04/25/2021      Component Value Date/Time   NA 136 04/25/2021 1100   K 3.9 04/25/2021 1100   CL 102 04/25/2021 1100   CO2 24 04/25/2021 1100   GLUCOSE 169 (H) 04/25/2021 1100   BUN 12 04/25/2021 1100   CREATININE 0.76 04/25/2021 1100   CREATININE 0.98 04/13/2015 1440   CALCIUM 8.8 (L) 04/25/2021 1100   PROT 6.7 01/19/2021 2014   ALBUMIN 2.9 (L) 01/19/2021 2014   AST 36 01/19/2021 2014   ALT 27 04/11/2021 0912   ALKPHOS 162 (H) 01/19/2021 2014   BILITOT 0.9 01/19/2021 2014   GFRNONAA >60 04/25/2021 1100   GFRAA >60 01/25/2017 1755   Lab Results  Component Value Date   CHOL 119 04/11/2021   HDL 34 (L) 04/11/2021   LDLCALC 62 04/11/2021   LDLDIRECT 132.0 12/05/2015   TRIG 131 04/11/2021   CHOLHDL 3.5 04/11/2021   Lab Results  Component Value Date   HGBA1C 6.8 (H) 04/25/2021   Lab Results  Component Value Date   VITAMINB12 442 01/22/2021   No results found for:  TSH    01/21/21 MRI brain 1. Motion degraded examination. 2. 3 punctate foci of acute ischemia, within the left parietal lobe, left occipital lobe and left cerebellum. No hemorrhage or mass effect. 3. Normal intracranial MRA.  01/22/21 MRI cervical spine 1. Severely motion degraded and truncated examination. 2. Apparent severe spinal canal stenosis at the C4-7 levels. CT of the cervical spine might be helpful, due to shorter scan time.  01/23/21 MRI cervical spine  1. 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 2. Patchy foci of cord signal abnormality involving the cord at C4-5, C5-6, and likely C7-T1, consistent with chronic myelomalacia. Appearance is most pronounced at the C4-5 level. 3. Multifactorial degenerative changes with resultant multilevel foraminal narrowing as above. Notable findings include moderate bilateral C4 foraminal stenosis, severe bilateral C5 and C6 foraminal narrowing, with severe left and moderate right C7 foraminal stenosis.   ASSESSMENT AND PLAN  50 y.o. year old male here with:   Dx:  1. Cervical myelopathy (HCC)       PLAN:  CERVICAL MYELOPATHY (s/p ACDF C4-5, C5-6, C6-7, C7-T1) - currently in SNF [place] [facility]) - continue PT / [place] (in setting of STEMI and PCI) - continue aspirin 4m, brillinta, BP control, DM control, statin  HYPOTENSION - stay hydrated; follow up with PCP   Return for return to PCP.    VPenni Bombard MD 149/02/262 17:85PM Certified in Neurology, Neurophysiology and Neuroimaging  [facility] [address] [place] [phone]

## 2021-05-22 NOTE — Patient Instructions (Signed)
  CERVICAL MYELOPATHY (s/p ACDF C4-5, C5-6, C6-7, C7-T1) - currently in SNF ([facility] [facility]) - continue PT / [place] (in setting of STEMI and PCI) - continue aspirin 25m, brillinta, BP control, DM control, statin

## 2021-05-24 ENCOUNTER — Telehealth: Payer: Self-pay | Admitting: *Deleted

## 2021-05-24 NOTE — Telephone Encounter (Signed)
-----   Message from Nelva Bush, MD sent at 05/24/2021  8:14 AM EDT ----- Please let Mr. Duffner know that his heart pumping function remains weakened (LVEF 30-35%, previously 40% at the time of MI in June).  I recommend that he continue his current medications and follow-up in the office as scheduled next month.

## 2021-05-24 NOTE — Telephone Encounter (Signed)
Attempted to call pt at all numbers listed. No answer. No vm set up.  Will try to reach pt at a later time.

## 2021-05-30 NOTE — Telephone Encounter (Signed)
Attempted to call pt, no answer x 2.  No voicemail set up.  Will try to reach pt at another time.

## 2021-06-06 NOTE — Telephone Encounter (Signed)
Attempted to call pt x 3. No answer. No vm set up.  Letter mailed to pt asking to call our office to review results.

## 2021-07-16 ENCOUNTER — Other Ambulatory Visit: Payer: Self-pay

## 2021-07-16 ENCOUNTER — Ambulatory Visit (INDEPENDENT_AMBULATORY_CARE_PROVIDER_SITE_OTHER): Payer: Medicaid Other | Admitting: Internal Medicine

## 2021-07-16 ENCOUNTER — Encounter: Payer: Self-pay | Admitting: Internal Medicine

## 2021-07-16 VITALS — BP 98/60 | HR 85 | Ht 74.0 in | Wt 214.0 lb

## 2021-07-16 DIAGNOSIS — I2510 Atherosclerotic heart disease of native coronary artery without angina pectoris: Secondary | ICD-10-CM

## 2021-07-16 DIAGNOSIS — M4712 Other spondylosis with myelopathy, cervical region: Secondary | ICD-10-CM

## 2021-07-16 DIAGNOSIS — E1169 Type 2 diabetes mellitus with other specified complication: Secondary | ICD-10-CM

## 2021-07-16 DIAGNOSIS — I255 Ischemic cardiomyopathy: Secondary | ICD-10-CM

## 2021-07-16 DIAGNOSIS — E785 Hyperlipidemia, unspecified: Secondary | ICD-10-CM

## 2021-07-16 DIAGNOSIS — I5022 Chronic systolic (congestive) heart failure: Secondary | ICD-10-CM | POA: Diagnosis not present

## 2021-07-16 MED ORDER — FUROSEMIDE 20 MG PO TABS: 20.0000 mg | ORAL_TABLET | Freq: Every day | ORAL | 1 refills | Status: DC

## 2021-07-16 NOTE — Progress Notes (Signed)
Follow-up Outpatient Visit Date: 07/16/2021  Primary Care Provider: Ronald Pippins [address] Kristeen Mans [address] [place] [address]  Chief Complaint: Follow-up coronary artery disease and HFrEF  HPI:  Mr. Scurlock is a 50 y.o. male with history of coronary artery disease status post multiple PCI's with STEMI in 01/2021 due to simultaneous late stent thrombosis in the mid RCA as well as occlusion of the LCx complicated by cardiogenic shock, HFrEF due to ischemic cardiomyopathy, right lower extremity amputation, hypertension, hyperlipidemia, diabetes mellitus, and cervical myelopathy, who presents for follow-up of coronary artery disease.  I last saw him in late August, at which time he was doing well from a heart standpoint.  He was predominantly affected by progressive weakness and numbness in his upper and lower extremities due to cervical myelopathy.  He was essentially bed/wheelchair-bound at that point and underwent ACDF with Dr. Venetia Constable in early September.  Today, Mr. Zahler reports that his strength and paresthesias in his arms and the legs is gradually improving.  He is able to stand and walk a little bit with assistance.  He still spends most of his days sitting in a wheelchair with his feet down.  He has noticed some progressive leg swelling as well as weight gain over the last 2-3 months.  He denies chest pain, shortness of breath, palpitations, and orthopnea.  He stopped taking carvedilol due to lightheadedness and blood pressures with systolic readings into the 70s.  He also notes an episode of hematuria about 2 weeks ago attributed to a UTI, though in hindsight he wonders if he may have passed a kidney stone.  He remains in a rehab facility and suspects he will likely need to be there at least 2-3 more months, until his strength and balance have improved.  --------------------------------------------------------------------------------------------------  Past Medical History:   Diagnosis Date   CHF (congestive heart failure) ([place])    Coronary artery disease    Diabetes mellitus without complication ([place])    Ischemic cardiomyopathy    Myocardial infarction ([place])    inferior STEMI ~ 2016; 01/19/21 with CHB/cardiogenic shock requiring brief TVP, IABP, s/p aspiration thrombectomy/ballon angioplasty for late thrombosis mRCA stent & DES for acute occlusion mLCX   Past Surgical History:  Procedure Laterality Date   AMPUTATION Right 10/01/2020   Procedure: AMPUTATION 5th  RAY AND IRRIGATION AND DEBRIDEMENT;  Surgeon: Samara Deist, DPM;  Location: [facility];  Service: Podiatry;  Laterality: Right;   AMPUTATION Right 10/11/2020   Procedure: AMPUTATION RAY;  Surgeon: Caroline More, DPM;  Location: [facility];  Service: Podiatry;  Laterality: Right;   ANTERIOR CERVICAL DECOMPRESSION/DISCECTOMY FUSION 4 LEVELS N/A 04/25/2021   Procedure: Cervical Four-Five, Cervical Five-Six, Cervical Six-Seven, Cervical Seven- Thoracic One  Anterior cervical decompression/Discectomy/fusion;  Surgeon: Judith Part, MD;  Location: [facility];  Service: Neurosurgery;  Laterality: N/A;  Cervical Four-Five, Cervical Five-Six, Cervical Six-Seven, Cervical Seven- Thoracic One  Anterior cervical decompression/Discectomy/fusion   BACK SURGERY     CARDIAC CATHETERIZATION     CORONARY THROMBECTOMY N/A 01/19/2021   Procedure: Coronary Thrombectomy;  Surgeon: Jettie Booze, MD;  Location: [facility];  Service: Cardiovascular;  Laterality: N/A;   CORONARY/GRAFT ACUTE MI REVASCULARIZATION N/A 01/19/2021   Procedure: Coronary/Graft Acute MI Revascularization;  Surgeon: Jettie Booze, MD;  Location: [facility];  Service: Cardiovascular;  Laterality: N/A;   IABP INSERTION N/A 01/19/2021   Procedure: IABP Insertion;  Surgeon: Jettie Booze, MD;  Location: [facility];  Service: Cardiovascular;  Laterality: N/A;   INCISION AND DRAINAGE Right 09/29/2020   Procedure:  INCISION AND DRAINAGE, RIGHT FOOT;  Surgeon: Samara Deist, DPM;  Location: [facility];  Service: Podiatry;  Laterality: Right;   LEFT HEART CATH AND CORONARY ANGIOGRAPHY N/A 01/19/2021   Procedure: LEFT HEART CATH AND CORONARY ANGIOGRAPHY;  Surgeon: Jettie Booze, MD;  Location: [facility];  Service: Cardiovascular;  Laterality: N/A;   LOWER EXTREMITY ANGIOGRAPHY Right 10/03/2020   Procedure: Lower Extremity Angiography;  Surgeon: Katha Cabal, MD;  Location: [facility];  Service: Cardiovascular;  Laterality: Right;   TENDON LENGTHENING Right 10/11/2020   Procedure: TENDON LENGTHENING;  Surgeon: Caroline More, DPM;  Location: [facility];  Service: Podiatry;  Laterality: Right;    Current Meds  Medication Sig   ACETAMINOPHEN ER PO Take 650 mg by mouth every 6 (six) hours as needed.   albuterol (VENTOLIN HFA) 108 (90 Base) MCG/ACT inhaler Inhale 2 puffs into the lungs every 6 (six) hours as needed for wheezing or shortness of breath.   aspirin EC 81 MG EC tablet Take 1 tablet (81 mg total) by mouth daily. Swallow whole.   atorvastatin (LIPITOR) 80 MG tablet Take 1 tablet (80 mg total) by mouth daily.   baclofen (LIORESAL) 10 MG tablet Take 10 mg by mouth 3 (three) times daily.   bismuth subsalicylate (PEPTO BISMOL) 262 MG/15ML suspension Take 30 mLs by mouth every 4 (four) hours as needed for diarrhea or loose stools.   Bismuth Tribromoph-Petrolatum (XEROFORM PETROLATUM DRESSING EX) Place 1 application onto the skin as needed (wound care). Cleanse 2nd digit to left food with DWC, pat dry apply Xeroform and cover every other day and as needed for wound care   collagenase (SANTYL) ointment Apply 1 application topically every other day. Clean right foot wound with NS, pat dry and place collagen on wound bed then cover every other day and as needed.   Dextrose, Diabetic Use, (INSTA-GLUCOSE) 77.4 % GEL Take 1 Dose by mouth as needed (for BG less than 70).   empagliflozin  (JARDIANCE) 10 MG TABS tablet Take 10 mg by mouth daily.   fluticasone (FLONASE) 50 MCG/ACT nasal spray Place 2 sprays into both nostrils daily.   glipiZIDE (GLUCOTROL) 10 MG tablet Take 10 mg by mouth daily.   Glucagon, rDNA, (GLUCAGON EMERGENCY) 1 MG KIT Inject 1 mg as directed as needed (blood glucose less than 70).   insulin aspart (NOVOLOG) 100 UNIT/ML injection Inject 0-15 Units into the skin 3 (three) times daily with meals. Do NOT hold if patient is NPO. Moderate Scale.   loperamide (IMODIUM) 2 MG capsule Take 2 mg by mouth every 8 (eight) hours as needed for diarrhea or loose stools.   losartan (COZAAR) 25 MG tablet Take 0.5 tablets (12.5 mg total) by mouth at bedtime.   melatonin 3 MG TABS tablet Take 1 tablet (3 mg total) by mouth at bedtime.   Menthol, Topical Analgesic, (BIOFREEZE) 4 % GEL Apply 1 application topically every 8 (eight) hours as needed (pain).   metFORMIN (GLUCOPHAGE) 500 MG tablet TAKE ONE TABLET BY MOUTH TWICE DAILY WITH A MEAL   nitroGLYCERIN (NITROSTAT) 0.4 MG SL tablet Place 1 tablet (0.4 mg total) under the tongue every 5 (five) minutes x 3 doses as needed for chest pain.   oxyCODONE (OXY IR/ROXICODONE) 5 MG immediate release tablet Take 1 tablet (5 mg total) by mouth every 4 (four) hours as needed (pain).   polyethylene glycol (MIRALAX /  GLYCOLAX) 17 g packet Take 17 g by mouth daily as needed for moderate constipation or severe constipation.   pregabalin (LYRICA) 150 MG capsule Take 150 mg by mouth 3 (three) times daily.   senna-docusate (SENOKOT-S) 8.6-50 MG tablet Take 2 tablets by mouth at bedtime as needed for mild constipation.   sodium chloride irrigation 0.9 % irrigation Irrigate with 1 application as directed every other day. Clean right foot wound with NS, pat dry and place collagen on wound bed then cover every other day and as needed.   ticagrelor (BRILINTA) 90 MG TABS tablet Take 1 tablet (90 mg total) by mouth 2 (two) times daily.   traZODone  (DESYREL) 50 MG tablet Take 50 mg by mouth at bedtime.    Allergies: Patient has no known allergies.  Social History   Tobacco Use   Smoking status: Every Day    Packs/day: 1.00    Years: 33.00    Pack years: 33.00    Types: Cigarettes   Smokeless tobacco: Never  Vaping Use   Vaping Use: Never used  Substance Use Topics   Alcohol use: Not Currently    Alcohol/week: 0.0 - 1.0 standard drinks   Drug use: No    Family History  Problem Relation Age of Onset   Breast cancer Mother    Heart attack Father    Diabetes Father    Heart disease Father    Lung cancer Paternal Grandmother     Review of Systems: A 12-system review of systems was performed and was negative except as noted in the HPI.  --------------------------------------------------------------------------------------------------  Physical Exam: BP 98/60 (BP Location: Right Arm, Patient Position: Sitting, Cuff Size: Large)   Pulse 85   Ht 6' 2"  (1.88 m)   Wt 214 lb (97.1 kg)   SpO2 99%   BMI 27.48 kg/m   General:  NAD. Neck: No gross JVD or HJR, though facial hair limits evaluation. Lungs: Clear to auscultation bilaterally without wheezes or crackles. Heart: Regular rate and rhythm without murmurs, rubs, or gallops. Abdomen: Soft, nontender, nondistended. Extremities: 1+ edema to the mid calves bilaterally.   Lab Results  Component Value Date   WBC 10.3 04/25/2021   HGB 14.2 04/25/2021   HCT 43.8 04/25/2021   MCV 98.4 04/25/2021   PLT 175 04/25/2021    Lab Results  Component Value Date   NA 136 04/25/2021   K 3.9 04/25/2021   CL 102 04/25/2021   CO2 24 04/25/2021   BUN 12 04/25/2021   CREATININE 0.76 04/25/2021   GLUCOSE 169 (H) 04/25/2021   ALT 27 04/11/2021    Lab Results  Component Value Date   CHOL 119 04/11/2021   HDL 34 (L) 04/11/2021   LDLCALC 62 04/11/2021   LDLDIRECT 132.0 12/05/2015   TRIG 131 04/11/2021   CHOLHDL 3.5 04/11/2021     --------------------------------------------------------------------------------------------------  ASSESSMENT AND PLAN: Coronary artery disease without angina: No angina reported.  Continue 12 months of DAPT with aspirin and ticagrelor from time of his MI/PCI in 01/2021.  Continue with aggressive secondary prevention as well.  Chronic HFrEF due to ischemic cardiomyopathy: Weight is up about 5 pounds since September (Mr. Dugal believes it is even more).  His soft blood pressure has limited our ability to treat him with GDMT; he is now even off carvedilol.  We will continue current doses of losartan and empagliflozin.  Due to some leg edema and weight gain, we have agreed to add furosemide 20 mg daily.  I will  have Mr. Ebrahimi return in 2-3 weeks for reassessment in the office as well as BMP at that time.  Hyperlipidemia associated with type 2 diabetes mellitus: Most recent lipid panel in August notable for good LDL control (62).  Continue current regimen of atorvastatin as well as diabetes management per PCP.  Cervical myelopathy: Weakness gradually improving status post ACDF in September.  Continue physical therapy and ongoing follow-up with neurosurgery.  Follow-up: Return to clinic in 2-3 weeks with BMP at that time.  Nelva Bush, MD 07/16/2021 11:18 AM

## 2021-07-16 NOTE — Patient Instructions (Addendum)
Medication Instructions:   Your physician has recommended you make the following change in your medication:   START Furosemide (Lasix) 20 mg daily   *If you need a refill on your cardiac medications before your next appointment, please call your pharmacy*   Lab Work:  None ordered  Testing/Procedures:  None ordered   Follow-Up: At [facility], you and your health needs are our priority.  As part of our continuing mission to provide you with exceptional heart care, we have created designated Provider Care Teams.  These Care Teams include your primary Cardiologist (physician) and Advanced Practice Providers (APPs -  Physician Assistants and Nurse Practitioners) who all work together to provide you with the care you need, when you need it.  We recommend signing up for the patient portal called "MyChart".  Sign up information is provided on this After Visit Summary.  MyChart is used to connect with patients for Virtual Visits (Telemedicine).  Patients are able to view lab/test results, encounter notes, upcoming appointments, etc.  Non-urgent messages can be sent to your provider as well.   To learn more about what you can do with MyChart, go to [email or web address].    Your next appointment:    2 - 3  week(s) w/ APP  The format for your next appointment:   In Person  Provider:   You may see one of the following Advanced Practice Providers on your designated Care Team:    Murray Hodgkins, NP Christell Faith, PA-C Cadence Kathlen Mody, [place]

## 2021-07-30 ENCOUNTER — Encounter: Payer: Self-pay | Admitting: Medical

## 2021-07-30 ENCOUNTER — Other Ambulatory Visit: Payer: Self-pay

## 2021-07-30 ENCOUNTER — Ambulatory Visit (INDEPENDENT_AMBULATORY_CARE_PROVIDER_SITE_OTHER): Payer: Medicaid Other | Admitting: Medical

## 2021-07-30 VITALS — BP 110/62 | HR 96 | Ht 74.0 in | Wt 212.0 lb

## 2021-07-30 DIAGNOSIS — I5022 Chronic systolic (congestive) heart failure: Secondary | ICD-10-CM | POA: Diagnosis not present

## 2021-07-30 DIAGNOSIS — M4712 Other spondylosis with myelopathy, cervical region: Secondary | ICD-10-CM

## 2021-07-30 DIAGNOSIS — I255 Ischemic cardiomyopathy: Secondary | ICD-10-CM | POA: Diagnosis not present

## 2021-07-30 DIAGNOSIS — E1169 Type 2 diabetes mellitus with other specified complication: Secondary | ICD-10-CM | POA: Diagnosis not present

## 2021-07-30 DIAGNOSIS — I2510 Atherosclerotic heart disease of native coronary artery without angina pectoris: Secondary | ICD-10-CM

## 2021-07-30 DIAGNOSIS — E785 Hyperlipidemia, unspecified: Secondary | ICD-10-CM

## 2021-07-30 NOTE — Patient Instructions (Signed)
Medication Instructions:  Your physician has recommended you make the following change in your medication:   INCREASE Lasix (furosemide) to 20 mg twice a day for 5 days. Then resume 20 mg daily.   *If you need a refill on your cardiac medications before your next appointment, please call your pharmacy*   Lab Work: Bnp and Bmp today If you have labs (blood work) drawn today and your tests are completely normal, you will receive your results only by: [place] (if you have MyChart) OR A paper copy in the mail If you have any lab test that is abnormal or we need to change your treatment, we will call you to review the results.   Testing/Procedures: None ordered   Follow-Up: At [facility], you and your health needs are our priority.  As part of our continuing mission to provide you with exceptional heart care, we have created designated Provider Care Teams.  These Care Teams include your primary Cardiologist (physician) and Advanced Practice Providers (APPs -  Physician Assistants and Nurse Practitioners) who all work together to provide you with the care you need, when you need it.  We recommend signing up for the patient portal called "MyChart".  Sign up information is provided on this After Visit Summary.  MyChart is used to connect with patients for Virtual Visits (Telemedicine).  Patients are able to view lab/test results, encounter notes, upcoming appointments, etc.  Non-urgent messages can be sent to your provider as well.   To learn more about what you can do with MyChart, go to [email or web address].    Your next appointment:   4 week(s)  The format for your next appointment:   In Person  Provider:   You may see Nelva Bush, MD or one of the following Advanced Practice Providers on your designated Care Team:   Murray Hodgkins, NP Christell Faith, PA-C Cadence Kathlen Mody, PA-C:1}    Other Instructions N/A

## 2021-07-30 NOTE — Progress Notes (Signed)
Cardiology Office Note:    Date:  07/30/2021   ID:  [name], DOB 04-08-1971, MRN [id number]  PCP:  Coral Spikes, DO  [facility] Cardiologist:  Larae Grooms, MD  [facility] Electrophysiologist:  None   Referring MD: Coral Spikes, DO   Chief Complaint: 2-3 week follow-up  History of Present Illness:    [name] is a 50 y.o. male with a hx of coronary artery disease status post multiple PCI's with STEMI in 01/2021 due to simultaneous late stent thrombosis in the mid RCA as well as occlusion of the LCx complicated by cardiogenic shock, HFrEF due to ischemic cardiomyopathy, right lower extremity amputation, hypertension, hyperlipidemia, diabetes mellitus, and cervical myelopathy, who presents for follow-up of coronary artery disease.  Admitted 01/2021 for STEMI. He presented to [facility] after a syncopal episode.  When he was found by EMS, EKG showed ST elevation.  He underwent aspiration thrombectomy and PTCA of the mid RCA as well as stenting of the mid LCx.  He had transient complete heart block in the setting of his MI, which resolved with revascularization.  LVEF was 40% by echo.  He also has transient atrial fibrillation with rapid ventricular response during the PCI.  He was noted to have bilateral hand weakness during his hospitalization with concern for worsening cervical myelopathy.  Last seen 07/16/21 and weight was up 5 lbs. Stopped Coreg for soft pressures. Lasix 27m daily was added.   Today, the patient reports he is doing well. Says lower leg swelling is improved on Lasix, not completely resolved. He has been taking lasix 212mdaily. He follows a low salt diet. He elevates his feet, does not wear compression socks. He sits in a wheelchair most of the day. He is doing PT and starting to walk. Needs labs today. No chest pain or SOB.   Past Medical History:  Diagnosis Date   CHF (congestive heart failure) (HCC)    Coronary artery disease    Diabetes mellitus  without complication ([place]   Ischemic cardiomyopathy    [facility]   inferior STEMI ~ 2016; 01/19/21 with CHB/cardiogenic shock requiring brief TVP, IABP, s/p aspiration thrombectomy/ballon angioplasty for late thrombosis mRCA stent & DES for acute occlusion mLCX    Past Surgical History:  Procedure Laterality Date   AMPUTATION Right 10/01/2020   Procedure: AMPUTATION 5th  RAY AND IRRIGATION AND DEBRIDEMENT;  Surgeon: FoSamara DeistDPM;  Location: [facility];  Service: Podiatry;  Laterality: Right;   AMPUTATION Right 10/11/2020   Procedure: AMPUTATION RAY;  Surgeon: BaCaroline MoreDPM;  Location: [facility];  Service: Podiatry;  Laterality: Right;   ANTERIOR CERVICAL DECOMPRESSION/DISCECTOMY FUSION 4 LEVELS N/A 04/25/2021   Procedure: Cervical Four-Five, Cervical Five-Six, Cervical Six-Seven, Cervical Seven- Thoracic One  Anterior cervical decompression/Discectomy/fusion;  Surgeon: OsJudith PartMD;  [place] Service: Neurosurgery;  Laterality: N/A;  Cervical Four-Five, Cervical Five-Six, Cervical Six-Seven, Cervical Seven- Thoracic One  Anterior cervical decompression/Discectomy/fusion   BACK SURGERY     CARDIAC CATHETERIZATION     CORONARY THROMBECTOMY N/A 01/19/2021   Procedure: Coronary Thrombectomy;  Surgeon: VaJettie BoozeMD;  [place] [facility];  Service: Cardiovascular;  Laterality: N/A;   CORONARY/GRAFT ACUTE MI REVASCULARIZATION N/A 01/19/2021   Procedure: Coronary/Graft Acute MI Revascularization;  Surgeon: VaJettie BoozeMD;  [place] [facility];  Service: Cardiovascular;  Laterality: N/A;   IABP INSERTION N/A 01/19/2021   Procedure: IABP Insertion;  Surgeon:  Jettie Booze, MD;  Location: [place] CV LAB;  Service: Cardiovascular;  Laterality: N/A;   INCISION AND DRAINAGE Right 09/29/2020   Procedure: INCISION AND DRAINAGE, RIGHT FOOT;  Surgeon: Samara Deist, DPM;  Location: [facility];  Service:  Podiatry;  Laterality: Right;   LEFT HEART CATH AND CORONARY ANGIOGRAPHY N/A 01/19/2021   Procedure: LEFT HEART CATH AND CORONARY ANGIOGRAPHY;  Surgeon: Jettie Booze, MD;  Location: [place] CV LAB;  Service: Cardiovascular;  Laterality: N/A;   LOWER EXTREMITY ANGIOGRAPHY Right 10/03/2020   Procedure: Lower Extremity Angiography;  Surgeon: Katha Cabal, MD;  Location: [place] CV LAB;  Service: Cardiovascular;  Laterality: Right;   TENDON LENGTHENING Right 10/11/2020   Procedure: TENDON LENGTHENING;  Surgeon: Caroline More, DPM;  Location: [facility];  Service: Podiatry;  Laterality: Right;    Current Medications: Current Meds  Medication Sig   ACETAMINOPHEN ER PO Take 650 mg by mouth every 6 (six) hours as needed.   albuterol (VENTOLIN HFA) 108 (90 Base) MCG/ACT inhaler Inhale 2 puffs into the lungs every 6 (six) hours as needed for wheezing or shortness of breath.   aspirin EC 81 MG EC tablet Take 1 tablet (81 mg total) by mouth daily. Swallow whole.   atorvastatin (LIPITOR) 80 MG tablet Take 1 tablet (80 mg total) by mouth daily.   baclofen (LIORESAL) 10 MG tablet Take 10 mg by mouth 3 (three) times daily.   bismuth subsalicylate (PEPTO BISMOL) 262 MG/15ML suspension Take 30 mLs by mouth every 4 (four) hours as needed for diarrhea or loose stools.   Bismuth Tribromoph-Petrolatum (XEROFORM PETROLATUM DRESSING EX) Place 1 application onto the skin as needed (wound care). Cleanse 2nd digit to left food with DWC, pat dry apply Xeroform and cover every other day and as needed for wound care   collagenase (SANTYL) ointment Apply 1 application topically every other day. Clean right foot wound with NS, pat dry and place collagen on wound bed then cover every other day and as needed.   Dextrose, Diabetic Use, (INSTA-GLUCOSE) 77.4 % GEL Take 1 Dose by mouth as needed (for BG less than 70).   empagliflozin (JARDIANCE) 10 MG TABS tablet Take 10 mg by mouth daily.   fluticasone  (FLONASE) 50 MCG/ACT nasal spray Place 2 sprays into both nostrils daily.   furosemide (LASIX) 20 MG tablet Take 1 tablet (20 mg total) by mouth daily.   glipiZIDE (GLUCOTROL) 10 MG tablet Take 10 mg by mouth daily.   Glucagon, rDNA, (GLUCAGON EMERGENCY) 1 MG KIT Inject 1 mg as directed as needed (blood glucose less than 70).   insulin aspart (NOVOLOG) 100 UNIT/ML injection Inject 0-15 Units into the skin 3 (three) times daily with meals. Do NOT hold if patient is NPO. Moderate Scale.   loperamide (IMODIUM) 2 MG capsule Take 2 mg by mouth every 8 (eight) hours as needed for diarrhea or loose stools.   losartan (COZAAR) 25 MG tablet Take 0.5 tablets (12.5 mg total) by mouth at bedtime.   melatonin 3 MG TABS tablet Take 1 tablet (3 mg total) by mouth at bedtime.   Menthol, Topical Analgesic, (BIOFREEZE) 4 % GEL Apply 1 application topically every 8 (eight) hours as needed (pain).   metFORMIN (GLUCOPHAGE) 500 MG tablet TAKE ONE TABLET BY MOUTH TWICE DAILY WITH A MEAL   nitroGLYCERIN (NITROSTAT) 0.4 MG SL tablet Place 1 tablet (0.4 mg total) under the tongue every 5 (five) minutes x 3 doses as needed for chest pain.  oxyCODONE (OXY IR/ROXICODONE) 5 MG immediate release tablet Take 1 tablet (5 mg total) by mouth every 4 (four) hours as needed (pain).   polyethylene glycol (MIRALAX / GLYCOLAX) 17 g packet Take 17 g by mouth daily as needed for moderate constipation or severe constipation.   pregabalin (LYRICA) 150 MG capsule Take 150 mg by mouth 3 (three) times daily.   senna-docusate (SENOKOT-S) 8.6-50 MG tablet Take 2 tablets by mouth at bedtime as needed for mild constipation.   sodium chloride irrigation 0.9 % irrigation Irrigate with 1 application as directed every other day. Clean right foot wound with NS, pat dry and place collagen on wound bed then cover every other day and as needed.   ticagrelor (BRILINTA) 90 MG TABS tablet Take 1 tablet (90 mg total) by mouth 2 (two) times daily.   traZODone  (DESYREL) 50 MG tablet Take 50 mg by mouth at bedtime.     Allergies:   Patient has no known allergies.   Social History   Socioeconomic History   Marital status: Single    Spouse name: Not on file   Number of children: Not on file   Years of education: Not on file   Highest education level: Not on file  Occupational History   Not on file  Tobacco Use   Smoking status: Every Day    Packs/day: 1.00    Years: 33.00    Pack years: 33.00    Types: Cigarettes   Smokeless tobacco: Never  Vaping Use   Vaping Use: Never used  Substance and Sexual Activity   Alcohol use: Not Currently    Alcohol/week: 0.0 - 1.0 standard drinks   Drug use: No   Sexual activity: Not on file  Other Topics Concern   Not on file  Social History Narrative   Not on file   Social Determinants of Health   Financial Resource Strain: Not on file  Food Insecurity: Not on file  Transportation Needs: Not on file  Physical Activity: Not on file  Stress: Not on file  Social Connections: Not on file     Family History: The patient's family history includes Breast cancer in his mother; Diabetes in his father; Heart attack in his father; Heart disease in his father; Lung cancer in his paternal grandmother.  ROS:   Please see the history of present illness.     All other systems reviewed and are negative.  EKGs/Labs/Other Studies Reviewed:    The following studies were reviewed today:  Echo 05/21/21 1. Left ventricular ejection fraction, by estimation, is 30 to 35%. The  left ventricle has moderately decreased function. The left ventricle  demonstrates regional wall motion abnormalities (see scoring  diagram/findings for description). There is mild  left ventricular hypertrophy. Left ventricular diastolic function could  not be evaluated. There is severe hypokinesis of the left ventricular,  entire inferoseptal wall and inferior wall.   2. Right ventricular systolic function was not well visualized.  The right  ventricular size is not well visualized. Tricuspid regurgitation signal is  inadequate for assessing PA pressure.   3. The mitral valve is normal in structure. No evidence of mitral valve  regurgitation. No evidence of mitral stenosis.   4. The aortic valve was not well visualized. Aortic valve regurgitation  is not visualized. No aortic stenosis is present.   5. The inferior vena cava is normal in size with greater than 50%  respiratory variability, suggesting right atrial pressure of 3 mmHg.   6.  Challenging image quality.   Echo 01/20/21 1. Left ventricular ejection fraction, by estimation, is 40%. The left  ventricle has moderately decreased function. The left ventricle  demonstrates regional wall motion abnormalities (see scoring  diagram/findings for description). Left ventricular  diastolic parameters are indeterminate. There is severe hypokinesis of the  left ventricular, entire inferior wall and inferolateral wall.   2. Right ventricular systolic function is mildly reduced. The right  ventricular size is normal. Tricuspid regurgitation signal is inadequate  for assessing PA pressure.   3. The mitral valve is normal in structure. No evidence of mitral valve  regurgitation.   4. The aortic valve is tricuspid. Aortic valve regurgitation is not  visualized. Mild aortic valve sclerosis is present, with no evidence of  aortic valve stenosis.   5. The inferior vena cava is dilated in size with <50% respiratory  variability, suggesting right atrial pressure of 15 mmHg.   Cardiac cath 01/19/21 Mid RCA lesion is 100% stenosed- very late stent thrombosis. Aspiration thrombectomy was performed which restored TIMI-3 flow. Balloon angioplasty was performed using a BALLOON [place] [employer] [id number]. Post intervention, there is a 0% residual stenosis. Mid Cx lesion is 100% stenosed. A drug-eluting stent was successfully placed using a STENT RESOLUTE ONYX 2.5X15, postdilated to greater than  3 mm. Post intervention, there is a 0% residual stenosis. LV end diastolic pressure is mildly elevated. There is no aortic valve stenosis. Temporary pacemaker placement was used. Heart rate improved after PCI of the RCA. The temporary pacer was removed. Successful placement of the intra-aortic balloon pump.   Dual antiplatelet therapy for 12 months along with aggressive secondary prevention.  Very late stent thrombosis of the RCA likely due to his antiplatelet therapy being stopped back in February.  Second acute occlusion of the circumflex.     Transfer to [facility] given his intra-aortic balloon pump.     EKG:  EKG is not ordered today.   Recent Labs: 10/12/2020: Magnesium 1.7 04/11/2021: ALT 27 04/25/2021: BUN 12; Creatinine, Ser 0.76; Hemoglobin 14.2; Platelets 175; Potassium 3.9; Sodium 136  Recent Lipid Panel    Component Value Date/Time   CHOL 119 04/11/2021 0912   TRIG 131 04/11/2021 0912   HDL 34 (L) 04/11/2021 0912   CHOLHDL 3.5 04/11/2021 0912   CHOLHDL 4.8 01/20/2021 0320   VLDL 24 01/20/2021 0320   LDLCALC 62 04/11/2021 0912   LDLDIRECT 132.0 12/05/2015 1510    Physical Exam:    VS:  BP 110/62 (BP Location: Right Arm, Patient Position: Sitting, Cuff Size: Normal)   Pulse 96   Ht 6' 2" (1.88 m)   Wt 212 lb (96.2 kg)   SpO2 98%   BMI 27.22 kg/m     Wt Readings from Last 3 Encounters:  07/30/21 212 lb (96.2 kg)  07/16/21 214 lb (97.1 kg)  04/25/21 209 lb (94.8 kg)     GEN:  Well nourished, well developed in no acute distress HEENT: Normal NECK: No JVD; No carotid bruits LYMPHATICS: No lymphadenopathy CARDIAC: RRR, no murmurs, rubs, gallops RESPIRATORY:  Clear to auscultation without rales, wheezing or rhonchi  ABDOMEN: Soft, non-tender, non-distended MUSCULOSKELETAL:  +pedal edema; No deformity  SKIN: Warm and dry NEUROLOGIC:  Alert and oriented x 3 PSYCHIATRIC:  Normal affect   ASSESSMENT:    1. Chronic HFrEF (heart failure with reduced ejection  fraction) ([place])   2. Coronary artery disease involving native coronary artery of native heart without angina pectoris   3. Hyperlipidemia associated with  type 2 diabetes mellitus ([place])   4. Chronic systolic heart failure ([place])   5. Ischemic cardiomyopathy   6. Cervical spondylosis with myelopathy    PLAN:    In order of problems listed above:  CAD s/p multiple PCI's with STEMI in 01/2021  No anginal symptoms reported. Continue DAPt for 12 months with Aspirin and Brilinta. Continue Lipitor.  LLE HFrEF/ICM Patient reports improved lower leg edema with lasix 87m daily. He still has pedal edema on exam, however likely some is dependent given he is mostly in a wheelchair/sitting. Will increase lasix to 24mBID for 5 days. Check BMET and BNP today. Continue Jardiance and Losartan. Coreg held for hypotension. BMET and BNP today.   HLD LDL 62. Continue Lipitor  Cervical myelopathy He is currently in Rehab, working with PT to walk.  Disposition: Follow up in 1 month(s) with MD/APP      Signed, Cadence H Ninfa MeekerPA-C  07/30/2021 12:54 PM    [facility]

## 2021-07-31 LAB — BASIC METABOLIC PANEL
BUN/Creatinine Ratio: 22 — ABNORMAL HIGH (ref 9–20)
BUN: 18 mg/dL (ref 6–24)
CO2: 21 mmol/L (ref 20–29)
Calcium: 9.4 mg/dL (ref 8.7–10.2)
Chloride: 103 mmol/L (ref 96–106)
Creatinine, Ser: 0.83 mg/dL (ref 0.76–1.27)
Glucose: 80 mg/dL (ref 70–99)
Potassium: 3.8 mmol/L (ref 3.5–5.2)
Sodium: 141 mmol/L (ref 134–144)
eGFR: 107 mL/min/{1.73_m2} (ref >59)

## 2021-07-31 LAB — BRAIN NATRIURETIC PEPTIDE: BNP: 38.9 pg/mL (ref 0.0–100.0)

## 2021-08-01 ENCOUNTER — Telehealth: Payer: Self-pay | Admitting: Medical

## 2021-08-01 NOTE — Telephone Encounter (Signed)
Cadence David Stall, PA-C  08/01/2021 10:46 AM EST     BNP normal, there fore low suspicion of extra volume from heart failure. Suspect more so dependent edema. Needs compression socks, leg elevation, low salt. He can go back to taking lasix 20mg  daily.

## 2021-08-01 NOTE — Telephone Encounter (Signed)
2nd attempt to contact the patient. No answer.unable to lmom. Msg sts that the call cannot be completed at this time.

## 2021-08-01 NOTE — Telephone Encounter (Signed)
1st attempt to contact the patient. No answer- message received stating the call could not be completed at this time.   Will attempt to call back at a later time.

## 2021-08-01 NOTE — Telephone Encounter (Signed)
Cadence David Stall, PA-C  07/31/2021  9:12 AM EST     BMET wnl BNP pending

## 2021-08-02 NOTE — Telephone Encounter (Signed)
No answer/No voicemail on numbers listed for patient.   Called emergency contact per release form and left voicemail message to call back or have patient call so we can review results and recommendations.

## 2021-08-03 NOTE — Telephone Encounter (Signed)
Fourth attempt to reach pt and emergency contact.  No answer. No voicemail. Vm has been left for emergency contact to return call to review results.   Letter mailed to pt asking that he contact our office to discuss results and provider's recommendation.

## 2021-08-30 ENCOUNTER — Ambulatory Visit: Payer: Medicaid Other | Admitting: Internal Medicine

## 2021-10-04 ENCOUNTER — Other Ambulatory Visit: Payer: Self-pay

## 2021-10-04 ENCOUNTER — Encounter: Payer: Self-pay | Admitting: Internal Medicine

## 2021-10-04 ENCOUNTER — Ambulatory Visit (INDEPENDENT_AMBULATORY_CARE_PROVIDER_SITE_OTHER): Payer: Medicaid Other | Admitting: Internal Medicine

## 2021-10-04 VITALS — BP 110/74 | HR 76 | Ht 74.5 in | Wt 231.2 lb

## 2021-10-04 DIAGNOSIS — E1169 Type 2 diabetes mellitus with other specified complication: Secondary | ICD-10-CM | POA: Diagnosis not present

## 2021-10-04 DIAGNOSIS — I5022 Chronic systolic (congestive) heart failure: Secondary | ICD-10-CM

## 2021-10-04 DIAGNOSIS — E785 Hyperlipidemia, unspecified: Secondary | ICD-10-CM

## 2021-10-04 DIAGNOSIS — M4802 Spinal stenosis, cervical region: Secondary | ICD-10-CM | POA: Diagnosis not present

## 2021-10-04 DIAGNOSIS — I10 Essential (primary) hypertension: Secondary | ICD-10-CM

## 2021-10-04 DIAGNOSIS — I2510 Atherosclerotic heart disease of native coronary artery without angina pectoris: Secondary | ICD-10-CM

## 2021-10-04 MED ORDER — POTASSIUM CHLORIDE CRYS ER 20 MEQ PO TBCR: 20.0000 meq | EXTENDED_RELEASE_TABLET | Freq: Every day | ORAL | 1 refills | Status: DC

## 2021-10-04 MED ORDER — METOPROLOL SUCCINATE ER 25 MG PO TB24: 12.5000 mg | ORAL_TABLET | Freq: Every day | ORAL | 1 refills | Status: DC

## 2021-10-04 MED ORDER — FUROSEMIDE 40 MG PO TABS: 40.0000 mg | ORAL_TABLET | Freq: Every day | ORAL | 1 refills | Status: DC

## 2021-10-04 NOTE — Patient Instructions (Signed)
Medication Instructions:   Your physician has recommended you make the following change in your medication:   INCREASE Lasix (Furosemide) 40 mg daily   START Potassium Chloride 20 mEq daily   START Metoprolol Succinate 12.5 mg daily   *If you need a refill on your cardiac medications before your next appointment, please call your pharmacy*   Lab Work:  None ordered  Testing/Procedures:  None ordered   Follow-Up: At [facility], you and your health needs are our priority.  As part of our continuing mission to provide you with exceptional heart care, we have created designated Provider Care Teams.  These Care Teams include your primary Cardiologist (physician) and Advanced Practice Providers (APPs -  Physician Assistants and Nurse Practitioners) who all work together to provide you with the care you need, when you need it.  We recommend signing up for the patient portal called "MyChart".  Sign up information is provided on this After Visit Summary.  MyChart is used to connect with patients for Virtual Visits (Telemedicine).  Patients are able to view lab/test results, encounter notes, upcoming appointments, etc.  Non-urgent messages can be sent to your provider as well.   To learn more about what you can do with MyChart, go to [email or web address].    Your next appointment:    1 month(s) w/ APP  The format for your next appointment:   In Person  Provider:   You will see one of the following Advanced Practice Providers on your designated Care Team:   Murray Hodgkins, NP Christell Faith, PA-C Cadence Kathlen Mody, [place]

## 2021-10-04 NOTE — Progress Notes (Signed)
Follow-up Outpatient Visit Date: 10/04/2021  Primary Care Provider: Raelene Bott, MD [address] [place] [id number]  Chief Complaint: Follow-up CAD and HF  HPI:  [name] is a 51 y.o. male with history of  coronary artery disease status post multiple PCI's with STEMI in 01/2021 due to simultaneous late stent thrombosis in the mid RCA as well as occlusion of the LCx complicated by cardiogenic shock, HFrEF due to ischemic cardiomyopathy, right lower extremity amputation, hypertension, hyperlipidemia, diabetes mellitus, and cervical myelopathy, who presents for follow-up of coronary artery disease and heart failure.  He was last seen in our office in mid December by Cadence Kathlen Mody, [place], following addition of furosemide and discontinuation of carvedilol a few weeks earlier due to leg swelling and borderline hypotension.  At his last visit, he was feeling well with improved leg edema.  He still spent most of his day in a wheelchair but was participating with physical therapy and was starting to walk.  He was advised to increase furosemide to 20 mg twice daily x5 days and then return back to 20 mg daily thereafter.  Today, [name] feels well from a heart standpoint.  He denies chest pain, shortness of breath, and palpitations.  Lightheadedness improved after stopping carvedilol.  He feels like his leg swelling has been pretty well controlled.  He thinks the edema present today is a result of sleeping in a chair the last 2 nights.  He notes that his strength and walking are improving and he is hopeful that he will be able to leave rehab in a few more weeks.  --------------------------------------------------------------------------------------------------  Past Medical History:  Diagnosis Date   CHF (congestive heart failure) ([place])    Coronary artery disease    Diabetes mellitus without complication ([place])    Ischemic cardiomyopathy    Myocardial infarction [facility])     inferior STEMI ~ 2016; 01/19/21 with CHB/cardiogenic shock requiring brief TVP, IABP, s/p aspiration thrombectomy/ballon angioplasty for late thrombosis mRCA stent & DES for acute occlusion mLCX   Past Surgical History:  Procedure Laterality Date   AMPUTATION Right 10/01/2020   Procedure: AMPUTATION 5th  RAY AND IRRIGATION AND DEBRIDEMENT;  Surgeon: Samara Deist, DPM;  Location: [facility] ORS;  Service: Podiatry;  Laterality: Right;   AMPUTATION Right 10/11/2020   Procedure: AMPUTATION RAY;  Surgeon: Caroline More, DPM;  Location: [facility] ORS;  Service: Podiatry;  Laterality: Right;   ANTERIOR CERVICAL DECOMPRESSION/DISCECTOMY FUSION 4 LEVELS N/A 04/25/2021   Procedure: Cervical Four-Five, Cervical Five-Six, Cervical Six-Seven, Cervical Seven- Thoracic One  Anterior cervical decompression/Discectomy/fusion;  Surgeon: Judith Part, MD;  Location: [place];  Service: Neurosurgery;  Laterality: N/A;  Cervical Four-Five, Cervical Five-Six, Cervical Six-Seven, Cervical Seven- Thoracic One  Anterior cervical decompression/Discectomy/fusion   BACK SURGERY     CARDIAC CATHETERIZATION     CORONARY THROMBECTOMY N/A 01/19/2021   Procedure: Coronary Thrombectomy;  Surgeon: Jettie Booze, MD;  Location: [place] CV LAB;  Service: Cardiovascular;  Laterality: N/A;   CORONARY/GRAFT ACUTE MI REVASCULARIZATION N/A 01/19/2021   Procedure: Coronary/Graft Acute MI Revascularization;  Surgeon: Jettie Booze, MD;  Location: [place] CV [facility];  Service: Cardiovascular;  Laterality: N/A;   IABP INSERTION N/A 01/19/2021   Procedure: IABP Insertion;  Surgeon: Jettie Booze, MD;  Location: [place] CV LAB;  Service: Cardiovascular;  Laterality: N/A;   INCISION AND DRAINAGE Right 09/29/2020   Procedure: INCISION AND DRAINAGE, RIGHT FOOT;  Surgeon: Samara Deist, DPM;  Location[facility]  ORS;  Service: Podiatry;  Laterality: Right;   LEFT HEART CATH AND CORONARY ANGIOGRAPHY N/A 01/19/2021    Procedure: LEFT HEART CATH AND CORONARY ANGIOGRAPHY;  Surgeon: Jettie Booze, MD;  Location: [place] CV LAB;  Service: Cardiovascular;  Laterality: N/A;   LOWER EXTREMITY ANGIOGRAPHY Right 10/03/2020   Procedure: Lower Extremity Angiography;  Surgeon: Katha Cabal, MD;  Location: [place] CV LAB;  Service: Cardiovascular;  Laterality: Right;   TENDON LENGTHENING Right 10/11/2020   Procedure: TENDON LENGTHENING;  Surgeon: Caroline More, DPM;  Location: [facility] OR[facility];  Service: Podiatry;  Laterality: Right;    Current Meds  Medication Sig   ACETAMINOPHEN ER PO Take 650 mg by mouth every 6 (six) hours as needed.   albuterol (VENTOLIN HFA) 108 (90 Base) MCG/ACT inhaler Inhale 2 puffs into the lungs every 6 (six) hours as needed for wheezing or shortness of breath.   aspirin EC 81 MG EC tablet Take 1 tablet (81 mg total) by mouth daily. Swallow whole.   atorvastatin (LIPITOR) 80 MG tablet Take 1 tablet (80 mg total) by mouth daily.   baclofen (LIORESAL) 10 MG tablet Take 10 mg by mouth 3 (three) times daily.   bismuth subsalicylate (PEPTO BISMOL) 262 MG/15ML suspension Take 30 mLs by mouth every 4 (four) hours as needed for diarrhea or loose stools.   Bismuth Tribromoph-Petrolatum (XEROFORM PETROLATUM DRESSING EX) Place 1 application onto the skin as needed (wound care). Cleanse 2nd digit to left food with DWC, pat dry apply Xeroform and cover every other day and as needed for wound care   collagenase (SANTYL) ointment Apply 1 application topically every other day. Clean right foot wound with NS, pat dry and place collagen on wound bed then cover every other day and as needed.   Dextrose, Diabetic Use, (INSTA-GLUCOSE) 77.4 % GEL Take 1 Dose by mouth as needed (for BG less than 70).   empagliflozin (JARDIANCE) 10 MG TABS tablet Take 10 mg by mouth daily.   fluticasone (FLONASE) 50 MCG/ACT nasal spray Place into the nose.   furosemide (LASIX) 20 MG tablet Take 1 tablet (20 mg total)  by mouth daily.   glipiZIDE (GLUCOTROL) 10 MG tablet Take 10 mg by mouth daily.   Glucagon, rDNA, (GLUCAGON EMERGENCY) 1 MG KIT Inject 1 mg as directed as needed (blood glucose less than 70).   insulin aspart (NOVOLOG) 100 UNIT/ML injection Inject 0-15 Units into the skin 3 (three) times daily with meals. Do NOT hold if patient is NPO. Moderate Scale.   loperamide (IMODIUM) 2 MG capsule Take 2 mg by mouth every 8 (eight) hours as needed for diarrhea or loose stools.   losartan (COZAAR) 25 MG tablet Take 0.5 tablets (12.5 mg total) by mouth at bedtime.   melatonin 3 MG TABS tablet Take 1 tablet (3 mg total) by mouth at bedtime.   Menthol, Topical Analgesic, (BIOFREEZE) 4 % GEL Apply 1 application topically every 8 (eight) hours as needed (pain).   nitroGLYCERIN (NITROSTAT) 0.4 MG SL tablet Place 1 tablet (0.4 mg total) under the tongue every 5 (five) minutes x 3 doses as needed for chest pain.   oxyCODONE (OXY IR/ROXICODONE) 5 MG immediate release tablet Take 1 tablet (5 mg total) by mouth every 4 (four) hours as needed (pain).   polyethylene glycol (MIRALAX / GLYCOLAX) 17 g packet Take 17 g by mouth daily as needed for moderate constipation or severe constipation.   pregabalin (LYRICA) 150 MG capsule Take 150 mg by mouth 3 (  three) times daily.   senna-docusate (SENOKOT-S) 8.6-50 MG tablet Take 2 tablets by mouth at bedtime as needed for mild constipation.   ticagrelor (BRILINTA) 90 MG TABS tablet Take 1 tablet (90 mg total) by mouth 2 (two) times daily.   traZODone (DESYREL) 50 MG tablet Take 50 mg by mouth at bedtime.    Allergies: Patient has no known allergies.  Social History   Tobacco Use   Smoking status: Every Day    Packs/day: 1.00    Years: 33.00    Pack years: 33.00    Types: Cigarettes   Smokeless tobacco: Never  Vaping Use   Vaping Use: Never used  Substance Use Topics   Alcohol use: Not Currently    Alcohol/week: 0.0 - 1.0 standard drinks   Drug use: No    Family  History  Problem Relation Age of Onset   Breast cancer Mother    Heart attack Father    Diabetes Father    Heart disease Father    Lung cancer Paternal Grandmother     Review of Systems: A 12-system review of systems was performed and was negative except as noted in the HPI.  --------------------------------------------------------------------------------------------------  Physical Exam: BP 110/74 (BP Location: Left Arm, Patient Position: Sitting, Cuff Size: Normal)    Pulse 76    Ht 6' 2.5" (1.892 m)    Wt 231 lb 4 oz (104.9 kg)    SpO2 98%    BMI 29.29 kg/m   General:  NAD.  Accompanied by aid from rehab facility Neck: No JVD or HJR. Lungs: Clear to auscultation bilaterally without wheezes or crackles. Heart: Regular rate and rhythm without murmurs, rubs, or gallops. Abdomen: Soft, nontender, nondistended. Extremities: 1+ chronic-appearing edema to knees.  EKG:  Normal sinus rhythm with possible inferior infarct.  No significant change from prior tracing on 04/11/2021.  Lab Results  Component Value Date   WBC 10.3 04/25/2021   HGB 14.2 04/25/2021   HCT 43.8 04/25/2021   MCV 98.4 04/25/2021   PLT 175 04/25/2021    Lab Results  Component Value Date   NA 141 07/30/2021   K 3.8 07/30/2021   CL 103 07/30/2021   CO2 21 07/30/2021   BUN 18 07/30/2021   CREATININE 0.83 07/30/2021   GLUCOSE 80 07/30/2021   ALT 27 04/11/2021    Lab Results  Component Value Date   CHOL 119 04/11/2021   HDL 34 (L) 04/11/2021   LDLCALC 62 04/11/2021   LDLDIRECT 132.0 12/05/2015   TRIG 131 04/11/2021   CHOLHDL 3.5 04/11/2021    --------------------------------------------------------------------------------------------------  ASSESSMENT AND PLAN: Coronary artery disease: No angina reported though function capacity is quite limited.  We will continue at least 12 months of DAPT.  Continue atorvastatin 80 mg daily as well.  Chronic HFrEF: 1+ edema noted in both legs.  Though Mr.  [place] reports that his leg swelling has been fairly well-controlled at rehab, the chronic appearance of his swelling suggests that he may have some degree of chronic edema.  He does not have JVD or crackles on exam.  However, I think he would benefit from escalation of furosemide to 40 mg daily.  I will also add potassium chloride 20 mEq daily and challenge him with metoprolol succinate 12.5 mg daily (he did not tolerate carvedilol due to lightheadedness).  We will continue losartan 12.5 mg daily.  Soft blood pressure precludes further escalation of GDMT.  We will need to consider repeating an echo once HF medications have been  maximized and consider EP consultation if EF remains less than 35%.  Hyperlipidemia  associated with type 2 diabetes mellitus: LDL at goal on last check in 03/2021.  Continue atorvastatin 80 mg daily.  Ongoing management of DM per PCP.  Spinal stenosis: Leg strength gradually improving.  Continue aggressive PT.  Follow-up: Return to clinic in 1 month; will need BMP at that time to recheck renal function and potassium.  Nelva Bush, MD 10/04/2021 11:27 AM

## 2021-10-05 ENCOUNTER — Encounter: Payer: Self-pay | Admitting: Internal Medicine

## 2021-11-01 ENCOUNTER — Encounter: Payer: Self-pay | Admitting: Medical

## 2021-11-01 ENCOUNTER — Other Ambulatory Visit: Payer: Self-pay

## 2021-11-01 ENCOUNTER — Ambulatory Visit (INDEPENDENT_AMBULATORY_CARE_PROVIDER_SITE_OTHER): Payer: Medicaid Other | Admitting: Medical

## 2021-11-01 VITALS — BP 108/62 | HR 78 | Ht 74.5 in | Wt 229.0 lb

## 2021-11-01 DIAGNOSIS — M4802 Spinal stenosis, cervical region: Secondary | ICD-10-CM | POA: Diagnosis not present

## 2021-11-01 DIAGNOSIS — I5022 Chronic systolic (congestive) heart failure: Secondary | ICD-10-CM | POA: Diagnosis not present

## 2021-11-01 DIAGNOSIS — E782 Mixed hyperlipidemia: Secondary | ICD-10-CM | POA: Diagnosis not present

## 2021-11-01 DIAGNOSIS — I2510 Atherosclerotic heart disease of native coronary artery without angina pectoris: Secondary | ICD-10-CM | POA: Diagnosis not present

## 2021-11-01 MED ORDER — SPIRONOLACTONE 25 MG PO TABS: 12.5000 mg | ORAL_TABLET | Freq: Every day | ORAL | 1 refills | Status: DC

## 2021-11-01 MED ORDER — POTASSIUM CHLORIDE ER 10 MEQ PO CPCR: 20.0000 meq | ORAL_CAPSULE | Freq: Every day | ORAL | 6 refills | Status: DC

## 2021-11-01 NOTE — Progress Notes (Signed)
?Cardiology Office Note:   ? ?Date:  11/01/2021  ? ?ID:  [name], DOB 03-11-1971, MRN [id number] ? ?PCP:  Raelene Bott, MD  ?[facility] Cardiologist:  Larae Grooms, MD  ?[place] Electrophysiologist:  None  ? ?Referring MD: Raelene Bott, MD  ? ?Chief Complaint: 1 month follow-up ? ?History of Present Illness:   ? ?[name] is a 51 y.o. male with a hx of CAD s/p multiple PCIs with STEMI in 01/2021 due ti simultaneous late stent thrombosis in the mid RCA as well as occlusion of the Lcx complicated by cardiogenic shock, HFrEF due to ischemic cardiomyopathy, right lower extremity amputation, HTN, HLD, diabetes mellitis, and cervical myelopathy who presents for 1 month follow-up.  ? ?Admitted 01/2021 for STEMI. He presented to [facility] after a syncopal episode.  When he was found by EMS, EKG showed ST elevation.  He underwent aspiration thrombectomy and PTCA of the mid RCA as well as stenting of the mid LCx.  He had transient complete heart block in the setting of his MI, which resolved with revascularization.  LVEF was 40% by echo.  He also has transient atrial fibrillation with rapid ventricular response during the PCI.  He was noted to have bilateral hand weakness during his hospitalization with concern for worsening cervical myelopathy. ? ?Last seen 10/04/21 and was overall felling well from a cardiac standpoint. Noted to have 1+ lower leg edema and Lasix was increased to 66m daily and potassium was added. He was re-challenged with metoprolol 12.530mdaily. ? ?Today, the patient has been doing well. He plans on doing carpal tunnel surgery in the coming months. No chest pain, SOB, orthopnea, pnd.  Bps have been good at home. Patient says he didn't tolerate metoprolol due to hypotension. He reports lower leg edema improved on higher dose lasix. Also requesting potassium capsules ? ?Past Medical History:  ?Diagnosis Date  ? [place]  ? Coronary artery disease   ?  [place]  ? Ischemic cardiomyopathy   [facility]  ? [facility] ~ [facility]; 01/19/21 with CHB/cardiogenic shock requiring brief TVP, IABP, s/p aspiration thrombectomy/ballon angioplasty for late thrombosis mRCA stent & DES for acute occlusion mLCX  ? ? ?Past Surgical History:  ?Procedure Laterality Date  ? AMPUTATION Right 10/01/2020  ? Procedure: AMPUTATION 5th  RAY AND IRRIGATION AND DEBRIDEMENT;  Surgeon: FoSamara DeistDPM;  Location: [facility];  Service: Podiatry;  Laterality: Right;  ? AMPUTATION Right 10/11/2020  ? Procedure: AMPUTATION RAY;  Surgeon: BaCaroline MoreDPM;  Location: [facility];  Service: Podiatry;  Laterality: Right;  ? ANTERIOR CERVICAL DECOMPRESSION/DISCECTOMY FUSION 4 LEVELS N/A 04/25/2021  ? Procedure: Cervical Four-Five, Cervical Five-Six, Cervical Six-Seven, Cervical Seven- Thoracic One  Anterior cervical decompression/Discectomy/fusion;  Surgeon: OsJudith PartMD;  [facility] Service: Neurosurgery;  Laterality: N/A;  Cervical Four-Five, Cervical Five-Six, Cervical Six-Seven, Cervical Seven- Thoracic One  Anterior cervical decompression/Discectomy/fusion  ? BACK SURGERY    ? CARDIAC CATHETERIZATION    ? CORONARY THROMBECTOMY N/A 01/19/2021  ? Procedure: Coronary Thrombectomy;  Surgeon: VaJettie BoozeMD;  [facility];  Service: Cardiovascular;  Laterality: N/A;  ? CORONARY/GRAFT ACUTE MI REVASCULARIZATION N/A 01/19/2021  ? Procedure: Coronary/Graft Acute MI Revascularization;  Surgeon: VaJettie BoozeMD;  [facility];  Service: Cardiovascular;  Laterality: N/A;  ? IABP INSERTION N/A 01/19/2021  ? Procedure: IABP Insertion;  Surgeon: VaLarae Grooms  S, MD;  Location: [place] [facility];  Service: Cardiovascular;  Laterality: N/A;  ? INCISION AND DRAINAGE Right 09/29/2020  ? Procedure: INCISION AND DRAINAGE, RIGHT FOOT;  Surgeon: Samara Deist, DPM;  Location: [facility];   Service: Podiatry;  Laterality: Right;  ? LEFT HEART CATH AND CORONARY ANGIOGRAPHY N/A 01/19/2021  ? Procedure: LEFT HEART CATH AND CORONARY ANGIOGRAPHY;  Surgeon: Jettie Booze, MD;  Location: [place] [facility];  Service: Cardiovascular;  Laterality: N/A;  ? LOWER EXTREMITY ANGIOGRAPHY Right 10/03/2020  ? Procedure: Lower Extremity Angiography;  Surgeon: Katha Cabal, MD;  Location: [place] [facility];  Service: Cardiovascular;  Laterality: Right;  ? TENDON LENGTHENING Right 10/11/2020  ? Procedure: TENDON LENGTHENING;  Surgeon: Caroline More, DPM;  Location: [facility];  Service: Podiatry;  Laterality: Right;  ? ? ?Current Medications: ?Current Meds  ?Medication Sig  ? ACETAMINOPHEN ER PO Take 650 mg by mouth every 6 (six) hours as needed.  ? albuterol (VENTOLIN HFA) 108 (90 Base) MCG/ACT inhaler Inhale 2 puffs into the lungs every 6 (six) hours as needed for wheezing or shortness of breath.  ? aspirin EC 81 MG EC tablet Take 1 tablet (81 mg total) by mouth daily. Swallow whole.  ? atorvastatin (LIPITOR) 80 MG tablet Take 1 tablet (80 mg total) by mouth daily.  ? baclofen (LIORESAL) 10 MG tablet Take 10 mg by mouth 3 (three) times daily.  ? bismuth subsalicylate (PEPTO BISMOL) 262 MG/15ML suspension Take 30 mLs by mouth every 4 (four) hours as needed for diarrhea or loose stools.  ? Bismuth Tribromoph-Petrolatum (XEROFORM PETROLATUM DRESSING EX) Place 1 application onto the skin as needed (wound care). Cleanse 2nd digit to left food with DWC, pat dry apply Xeroform and cover every other day and as needed for wound care  ? collagenase (SANTYL) ointment Apply 1 application topically every other day. Clean right foot wound with NS, pat dry and place collagen on wound bed then cover every other day and as needed.  ? Dextrose, Diabetic Use, (INSTA-GLUCOSE) 77.4 % GEL Take 1 Dose by mouth as needed (for BG less than 70).  ? empagliflozin (JARDIANCE) 10 MG TABS tablet Take 10 mg by mouth daily.  ?  fluticasone (FLONASE) 50 MCG/ACT nasal spray Place into the nose.  ? glipiZIDE (GLUCOTROL) 10 MG tablet Take 10 mg by mouth daily.  ? Glucagon, rDNA, (GLUCAGON EMERGENCY) 1 MG KIT Inject 1 mg as directed as needed (blood glucose less than 70).  ? insulin aspart (NOVOLOG) 100 UNIT/ML injection Inject 0-15 Units into the skin 3 (three) times daily with meals. Do NOT hold if patient is NPO. ?Moderate Scale.  ? loperamide (IMODIUM) 2 MG capsule Take 2 mg by mouth every 8 (eight) hours as needed for diarrhea or loose stools.  ? losartan (COZAAR) 25 MG tablet Take 0.5 tablets (12.5 mg total) by mouth at bedtime.  ? melatonin 3 MG TABS tablet Take 1 tablet (3 mg total) by mouth at bedtime.  ? Menthol, Topical Analgesic, (BIOFREEZE) 4 % GEL Apply 1 application topically every 8 (eight) hours as needed (pain).  ? metFORMIN (GLUCOPHAGE) 500 MG tablet TAKE ONE TABLET BY MOUTH TWICE DAILY WITH A MEAL  ? nitroGLYCERIN (NITROSTAT) 0.4 MG SL tablet Place 1 tablet (0.4 mg total) under the tongue every 5 (five) minutes x 3 doses as needed for chest pain.  ? oxyCODONE (OXY IR/ROXICODONE) 5 MG immediate release tablet Take 1 tablet (5 mg total) by mouth every 4 (four) hours  as needed (pain).  ? polyethylene glycol (MIRALAX / GLYCOLAX) 17 g packet Take 17 g by mouth daily as needed for moderate constipation or severe constipation.  ? potassium chloride (MICRO-K) 10 MEQ CR capsule Take 2 capsules (20 mEq total) by mouth daily.  ? pregabalin (LYRICA) 150 MG capsule Take 150 mg by mouth 3 (three) times daily.  ? senna-docusate (SENOKOT-S) 8.6-50 MG tablet Take 2 tablets by mouth at bedtime as needed for mild constipation.  ? sodium chloride irrigation 0.9 % irrigation Irrigate with 1 application. as directed every other day. Clean right foot wound with NS, pat dry and place collagen on wound bed then cover every other day and as needed.  ? spironolactone (ALDACTONE) 25 MG tablet Take 0.5 tablets (12.5 mg total) by mouth daily.  ?  ticagrelor (BRILINTA) 90 MG TABS tablet Take 1 tablet (90 mg total) by mouth 2 (two) times daily.  ? traZODone (DESYREL) 50 MG tablet Take 50 mg by mouth at bedtime.  ? [DISCONTINUED] potassium chloride SA (KLOR-CON

## 2021-11-01 NOTE — Patient Instructions (Addendum)
Medication Instructions:  ?- Your physician has recommended you make the following change in your medication:  ? ?1) START Spironolactone 25 mg: ?- take 0.5 tablet (12.5 mg) by mouth once daily  ? ?2) CHANGE Potassium to 10 meq (Micro-K): ?- take 2 capsules (20 meq) by mouth once daily  ?(Patient is requesting a change to capsules) ? ? ?*If you need a refill on your cardiac medications before your next appointment, please call your pharmacy* ? ? ?Lab Work: ?- Your physician recommends that you have lab work in: 1-2 weeks- BMP  (please fax results to [phone]) ? ?If you have labs (blood work) drawn today and your tests are completely normal, you will receive your results only by: ?MyChart Message (if you have MyChart) OR ?A paper copy in the mail ?If you have any lab test that is abnormal or we need to change your treatment, we will call you to review the results. ? ? ?Testing/Procedures: ? ?1) Limited Echocardiogram: in 1 month ?- Your physician has requested that you have an echocardiogram. Echocardiography is a painless test that uses sound waves to create images of your heart. It provides your doctor with information about the size and shape of your heart and how well your heart?s chambers and valves are working. This procedure takes approximately one hour. There are no restrictions for this procedure. There is a possibility that an IV may need to be started during your test to inject an image enhancing agent. This is done to obtain more optimal pictures of your heart. Therefore we ask that you do at least drink some water prior to coming in to hydrate your veins.  ? ? ? ?Follow-Up: ?At [facility], you and your health needs are our priority.  As part of our continuing mission to provide you with exceptional heart care, we have created designated Provider Care Teams.  These Care Teams include your primary Cardiologist (physician) and Advanced Practice Providers (APPs -  Physician Assistants and Nurse  Practitioners) who all work together to provide you with the care you need, when you need it. ? ?We recommend signing up for the patient portal called "MyChart".  Sign up information is provided on this After Visit Summary.  MyChart is used to connect with patients for Virtual Visits (Telemedicine).  Patients are able to view lab/test results, encounter notes, upcoming appointments, etc.  Non-urgent messages can be sent to your provider as well.   ?To learn more about what you can do with MyChart, go to [email or web address].   ? ?Your next appointment:   ?2 month(s) ? ?The format for your next appointment:   ?In Person ? ?Provider:   ?You may see Nelva Bush, MD or one of the following Advanced Practice Providers on your designated Care Team:   ?Murray Hodgkins, NP ?Christell Faith, PA-C ?Cadence Kathlen Mody, PA-C  ? ? ?Other Instructions ? ?Spironolactone Tablets ?What is this medication? ?SPIRONOLACTONE (speer on [place] LAK tone) treats high blood pressure and heart failure. It may also be used to reduce swelling related to heart, kidney, or liver disease. It helps your kidneys remove more fluid and salt from your blood through the urine without losing too much potassium. It belongs to a group of medications called diuretics. ?This medicine may be used for other purposes; ask your health care provider or pharmacist if you have questions. ?COMMON BRAND NAME(S): Aldactone ?What should I tell my care team before I take this medication? ?They need to know if you have any  of these conditions: ?Addison's disease or low adrenal gland function ?High blood level of potassium ?Kidney disease ?Liver disease ?An unusual or allergic reaction to spironolactone, other medications, foods, dyes, or preservatives ?Pregnant or trying to get pregnant ?Breast-feeding ?How should I use this medication? ?Take this medication by mouth. Take it as directed on the prescription label at the same time every day. You can take it with or without  food. You should always take it the same way. Keep taking it unless your care team tells you to stop. ?Talk to your care team about the use of this medication in children. Special care may be needed. ?Overdosage: If you think you have taken too much of this medicine contact a poison control center or emergency room at once. ?NOTE: This medicine is only for you. Do not share this medicine with others. ?What if I miss a dose? ?If you miss a dose, take it as soon as you can. If it is almost time for your next dose, take only that dose. Do not take double or extra doses. ?What may interact with this medication? ?Do not take this medication with any of the following: ?Cidofovir ?Eplerenone ?Tranylcypromine ?This medication may also interact with the following: ?Aspirin ?Certain medications for blood pressure or heart disease like benazepril, lisinopril, losartan, valsartan ?Certain medications that treat or prevent blood clots like heparin and enoxaparin ?Cholestyramine ?Cyclosporine ?Digoxin ?Lithium ?Medications that relax muscles for surgery ?NSAIDs, medications for pain and inflammation, like ibuprofen or naproxen ?Other diuretics ?Potassium salts or supplements ?Steroid medications like prednisone or cortisone ?Trimethoprim ?This list may not describe all possible interactions. Give your health care provider a list of all the medicines, herbs, non-prescription drugs, or dietary supplements you use. Also tell them if you smoke, drink alcohol, or use illegal drugs. Some items may interact with your medicine. ?What should I watch for while using this medication? ?Visit your care team for regular checks on your progress. Check your blood pressure as directed. Ask your care team what your blood pressure should be. Also, find out when you should contact him or her. ?Do not treat yourself for coughs, colds, or pain while you are using this medication without asking your care team for advice. Some medications may increase  your blood pressure. ?Check with your care team if you have severe diarrhea, nausea, and vomiting, or if you sweat a lot. The loss of too much body fluid may make it dangerous for you to take this medication. ?You may need to be on a special diet while taking this medication. Ask your care team. Also, find out how many glasses of fluid you need to drink each day. ?You may get drowsy or dizzy. Do not drive, use machinery, or do anything that needs mental alertness until you know how this medication affects you. Do not stand or sit up quickly, especially if you are an older patient. This reduces the risk of dizzy or fainting spells. Alcohol may interfere with the effects of this medication. Avoid alcoholic drinks. ?Avoid salt substitutes unless you are told otherwise by your care team. ?What side effects may I notice from receiving this medication? ?Side effects that you should report to your care team as soon as possible: ?Allergic reactions--skin rash, itching, hives, swelling of the face, lips, tongue, or throat ?Dehydration--increased thirst, dry mouth, feeling faint or lightheaded, headache, dark yellow or brown urine ?High potassium level--muscle weakness, fast or irregular heartbeat ?Kidney injury--decrease in the amount of urine, swelling of the  ankles, hands, or feet ?Low blood pressure--dizziness, feeling faint or lightheaded, blurry vision ?Low sodium level--muscle weakness, fatigue, dizziness, headache, confusion ?Side effects that usually do not require medical attention (report to your care team if they continue or are bothersome): ?Breast pain or tenderness ?Changes in sex drive or performance ?Dizziness ?Headache ?Irregular menstrual cycles or spotting ?Unexpected breast tissue growth ?This list may not describe all possible side effects. Call your doctor for medical advice about side effects. You may report side effects to FDA at 1-800-FDA-1088. ?Where should I keep my medication? ?Keep out of the  reach of children and pets. ?Store below 25 degrees C (77 degrees F). Get rid of any unused medication after the expiration date. ?To get rid of medications that are no longer needed or have expired: ?Take the m

## 2021-11-14 ENCOUNTER — Other Ambulatory Visit: Payer: Self-pay | Admitting: Neurological Surgery

## 2021-11-30 ENCOUNTER — Encounter (HOSPITAL_COMMUNITY): Payer: Self-pay | Admitting: Physician Assistant

## 2021-12-03 ENCOUNTER — Ambulatory Visit (INDEPENDENT_AMBULATORY_CARE_PROVIDER_SITE_OTHER): Payer: Medicaid Other

## 2021-12-03 ENCOUNTER — Telehealth: Payer: Self-pay | Admitting: Internal Medicine

## 2021-12-03 DIAGNOSIS — I5022 Chronic systolic (congestive) heart failure: Secondary | ICD-10-CM | POA: Diagnosis not present

## 2021-12-03 LAB — ECHOCARDIOGRAM LIMITED
AV Mean grad: 4 mmHg
AV Peak grad: 7 mmHg
Ao pk vel: 1.32 m/s
Area-P 1/2: 4.31 cm2
Calc EF: 45.1 %
S' Lateral: 4.85 cm
Single Plane A2C EF: 44.9 %
Single Plane A4C EF: 44.6 %

## 2021-12-03 MED ORDER — PERFLUTREN LIPID MICROSPHERE
1.0000 mL | INTRAVENOUS | Status: AC | PRN
  Administered 2021-12-03: 2 mL via INTRAVENOUS

## 2021-12-03 NOTE — Telephone Encounter (Signed)
? ?  Pre-operative Risk Assessment  ?  ?Patient Name: [name]  ?DOB: [birth year] ?MRN: [id number]  ? ?  ? ?Request for Surgical Clearance   ? ?Procedure:   Right Carpal tunnel release, right cubital tunnel release ? ?Date of Surgery:  Clearance TBD                              ?   ?Surgeon:  Dr Emelda Brothers ?Surgeon's Group or Practice Name:  [facility] ?Phone number:  [phone] ?Fax number:  [phone] ?  ?Type of Clearance Requested:   ?- Pharmacy:  Hold Aspirin and Ticagrelor (Brilinta) instructions ?  ?Type of Anesthesia:  General  ?  ?Additional requests/questions:   ? ?Signed, ?Caryl Pina Gerringer   ?12/03/2021, 3:46 PM  ? ?

## 2021-12-03 NOTE — Telephone Encounter (Signed)
? ?  Patient Name: KVON MCILHENNY  ?DOB: [birth year] ?MRN: [id number] ? ?Primary Cardiologist: Previously Dr. Irish Lack but more recently Dr. Saunders Revel and Cadence Kathlen Mody PA-C ? ?Chart reviewed as part of pre-operative protocol coverage; requested to hold DAPT for carpal tunnel surgery. Patient had acute STEMI 01/19/2021 at which time it was recommended to continue for 12 months along with aggressive secondary prevention. He was felt to have very late stent thrombosis of the RCA likely due to his antiplatelet therapy being stopped back in February before MI. He was transferred to [facility] on balloon pump at that time. Therefore I would not suggest interrupting anticoagulation prematurely for elective procedure. The patient already has an upcoming appointment scheduled 01/01/22 with Cadence Kathlen Mody PA-C at which time this clearance can be addressed in case there are any new issues addressed that would impact pre-operative recommendations.  ? ?- Will tentatively route to Dr. Saunders Revel for input on holding ASA and Brilinta as requested when the patient has completed his 12 months post-MI so that Cadence can review this at time of visit in May. ? ?- I added "preop" comment to appointment notes so that provider is aware to address at time of OV. Per office protocol, the provider seeing this patient should forward their finalized clearance decision to requesting party below. ? ?- Will fax update to requesting surgeon so they are aware and remove from preop box as separate preop APP input not necessary at this time. ? ?Charlie Pitter, PA-C ?12/03/2021, 5:34 PM ? ?

## 2021-12-03 NOTE — Progress Notes (Signed)
Anesthesia Chart Review: SAME DAY WORK-UP ? Case: [id number] Date/Time: 12/04/21 1115  ? Procedures:  ?    Right CARPAL TUNNEL RELEASE (Right) ?    right cubital tunnel release (Right)  ? Anesthesia type: General  ? Pre-op diagnosis: Carpal tunnel syndrome, Ulnar neuropathy  ? Location: [facility] ROOM 18 / [facility]  ? Surgeons: Judith Part, MD  ? ?  ? ? ?DISCUSSION: Patient is a 51 year old male scheduled for the above procedure. ? ?History includes smoking, CAD (inferior STEMI, s/p RCA stent ~ 11/2014; inferior STEMI with CHB, s/p TVP, emergent LHC with aspiration thrombectomy and balloon angioplasty for mid RCA for late stent thrombosis & DES 100% midCX, IABP 01/19/21), ischemic cardiomyopathy (08/930), chronic systolic CHF (10/5571), DM2, spinal surgery (L2-S1 laminectomies, left L4-5 disk excision 02/24/07; C4-T1 ACDF 04/25/21), right foot infection (s/p partial 5th ray amputation 10/01/20; s/p right transmetatarrsal amputation, right tendo Achilles lengthening, rotational skin flap closure 10/11/20), NASH cirrhosis. ?  ?- [place] admission 01/19/21-02/05/21. 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 Dr. Irish Lack consulted and TVP placed and emergently taken to the cath lab and noted to have late in-stent occlusion of mid RCA and acute occlusion of mid CX. (He had been off anticoagulation therapy since 09/2020 following right TMA.)  He underwent aspiration thrombectomy and balloon angioplasty of RCA and DES to LCX. (Dr. Irish Lack opted not to stent the RCA given the CX was still occluded and patient remained hypotensive.) He had transient afib with RVR during the case that converted to SR after treated with metoprolol and IV amiodarone. HR stabilized after PCI and TVP removed, but due to hypotension and concern for cardiogenic shock, an IABP was placed and transferred to [facility]. CHB thought due to acute STEMI,  and resolved after PCI. Neurology [place], Harriet Pho, MD) consulted 01/21/21 due to bilateral hand weakness. Brain MRI showed 3 punctate foci of acute ischemia within the left parietal and occipital lobes and left cerebellum with normal brain MRA. The punctate strokes were too small to explain the patient's symptoms and did not have significant watershed infarct. Cervical spine imaging ordered and revealed significant c-spine stenosis and foraminal narrowing. Neurosurgery recommended surgery but timing complicated by recent DES on DAPT. Discharged to SNF with out-patient follow-up. Ultimately cervical myelopathy symptoms felt severe enough that Dr. Saunders Revel gave permission to temporarily hold ticagrelor for 5 days after 3 months and resume asap post-operatively and preference to continue ASA, although both were held. He underwent C4-T1 ACDF on 04/25/21 by Dr. Zada Finders.  He had 3 days of postoperative dysphagia that required steroids and rest, but improved.  Postoperative course was otherwise uncomplicated, and he was discharged back to SNF on 05/01/2021. ?  ?Patient had recent cardiology evaluation by Tarri Glenn, PA-C on 11/01/21.  Patient was "doing well.  She noted upcoming plans for carpal tunnel surgery.  No chest pain, shortness of breath, orthopnea, PND.  He did not tolerate metoprolol due to hypotension.  Edema improved with higher dose Lasix.  He appeared euvolemic on exam.  She plan to recheck echo in 1 month to reevaluate LVEF with follow-up in 2 months. Echo is currently scheduled for 12/03/21 at 2:00 PM.  ? ?He had labs on 11/28/21 through [facility] including a BMET, CBC with differential, urine cultures.  Results included sodium 137, potassium 4.3, BUN 30, creatinine 1.10, glucose 152, WBC 9.7, hemoglobin 12.7, hematocrit 38.0, platelet  count 142, and urine culture grew greater than 100,000 CFU/mL Proteus Mirabilis. ? ?Discussed with anesthesiologist Hulan Fray, MD. Patient's Kary Kos is reportedly being held for  surgery. He is > 6 months from DES but < 12 months out.  He also has a pending echo, although appears more to re-evaluate LVEF and guide management and not for any acute change in symptomology. Would recommend preoperative cardiology input. Communicated with Janett Billow at Dr. Colleen Can office and also notified her of recent recurrent Proteus UTI. Case will likely be postponed. ? ? ? ?VS:  ?BP Readings from Last 3 Encounters:  ?11/01/21 108/62  ?10/04/21 110/74  ?07/30/21 110/62  ? ?Pulse Readings from Last 3 Encounters:  ?11/01/21 78  ?10/04/21 76  ?07/30/21 96  ?  ? ?PROVIDERS: ?Raelene Bott, MD is PCP  ?- End, Harrell Gave, MD is cardiologist ?- Andrey Spearman, MD is neurologist. Last visit 05/22/21. Continue PT/OT post ACDF for cervical myelopathy; Continue DAPT, statin, BP/DM control for now for punctate CVA in setting of STEMI/PCI. On-going follow-up with PCP.  ?- Kathrin Ruddy, MD is urologist [facility]). Visit 11/09/21 for recurrent UTI, hematuria.  He was started on Flomax nightly.  It appears he may be scheduled for cystoscopy on May 1923.  11/28/2021 urine culture again grew Proteus Mirabilis (again resistant to Cipro, Levofloxacin, Nitrofurantoin). ? ?  ?LABS: For day of surgery as indicated. See DISCUSSION. ? ? ?IMAGES: ?Xray C-spine (post ACDF) 04/27/21: ?IMPRESSION: ?1. Increasing soft tissue swelling in the retropharyngeal region ?from C2 through C5, with narrowing of the supraglottic airway. ?Please correlate with respiratory status. ?2. Stable position of the anterior fusion hardware spanning C4 ?through T1. ?3. Soft tissue gas in the retropharyngeal region and right side ?neck, likely related to recent surgical intervention. Consistent ?with recent surgical intervention. ?  ? ?EKG: 10/04/21 ([facility]): NSR. Cannot rule out inferior infarct (age undetermined).  ? ?  ?CV: ?Echo 05/21/21: ?IMPRESSIONS  ? 1. Left ventricular ejection fraction, by estimation, is 30 to 35%. The  ?left ventricle has  moderately decreased function. The left ventricle  ?demonstrates regional wall motion abnormalities (see scoring  ?diagram/findings for description). There is mild  ?left ventricular hypertrophy. Left ventricular diastolic function could  ?not be evaluated. There is severe hypokinesis of the left ventricular,  ?entire inferoseptal wall and inferior wall.  ? 2. Right ventricular systolic function was not well visualized. The right  ?ventricular size is not well visualized. Tricuspid regurgitation signal is  ?inadequate for assessing PA pressure.  ? 3. The mitral valve is normal in structure. No evidence of mitral valve  ?regurgitation. No evidence of mitral stenosis.  ? 4. The aortic valve was not well visualized. Aortic valve regurgitation  ?is not visualized. No aortic stenosis is present.  ? 5. The inferior vena cava is normal in size with greater than 50%  ?respiratory variability, suggesting right atrial pressure of 3 mmHg.  ? 6. Challenging image quality.  ?- Comparison: 01/20/21 LVEF 40% with severe hypokinesis of the  ?left ventricular, entire inferior wall and inferolateral wall, mildly reduced RVSF ? ? ?Cardiac event monitor 02/08/21-03/09/21: ?Study Highlights ?Normal sinus rhythm with rare PACs and PVCs. ?No atrial fibrillation. ?No pathologic arrhythmias noted. ?  ? ? ?Emergency cardiac cath 01/19/21: ?Mid RCA lesion is 100% stenosed- very late stent thrombosis. Aspiration thrombectomy was performed which restored TIMI-3 flow. ?Balloon angioplasty was performed using a BALLOON [place] [id number]. ?Post intervention, there is a 0% residual stenosis. ?Mid Cx lesion is 100% stenosed. ?A drug-eluting stent  was successfully placed using a STENT RESOLUTE ONYX 2.5X15, postdilated to greater than 3 mm. ?Post intervention, there is a 0% residual stenosis. ?LV end diastolic pressure is mildly elevated. ?There is no aortic valve stenosis. ?Temporary pacemaker placement was used. Heart rate improved after PCI of the RCA.  The temporary pacer was removed. ?Successful placement of the intra-aortic balloon pump. ?  ?- Dual antiplatelet therapy for 12 months along with aggressive secondary prevention.  Very late stent thrombos

## 2021-12-04 ENCOUNTER — Ambulatory Visit (HOSPITAL_COMMUNITY): Admission: RE | Admit: 2021-12-04 | Payer: Medicaid Other | Source: Home / Self Care | Admitting: Neurological Surgery

## 2021-12-04 ENCOUNTER — Encounter (HOSPITAL_COMMUNITY): Admission: RE | Payer: Self-pay | Source: Home / Self Care

## 2021-12-04 SURGERY — CARPAL TUNNEL RELEASE
Anesthesia: General | Laterality: Right

## 2021-12-04 NOTE — Telephone Encounter (Signed)
I had faxed our recommendation of delay of surgery to requesting surgeon yesterday but will route to callback team to call surgeon's office to ensure that surgery is elective and they are OK with delaying this until after 01/19/2022.  ? ?If they feel that delaying carpal tunnel surgery places the patient at high risk of significant morbidity, we can revisit clearance.  ? ?But otherwise if OK to delay to allow completion of 12 months of uninterrupted dual antiplatelet therapy, patient has a f/u appt with Cadence Furth 01/01/22 at which time his preop evaluation can be finalized. ?

## 2021-12-04 NOTE — Telephone Encounter (Signed)
Unless delaying carpal tunnel surgery places the patient at high risk for significant morbidity, I would favor completing dual antiplatelet therapy with aspirin and ticagrelor for 12 months from the time of his MI in 01/2021.  At that point, ticagrelor could be stopped though I would recommend continuing aspirin 81 mg daily during the perioperative period. ? ?Nelva Bush, MD ?[facility] ? ?

## 2021-12-05 ENCOUNTER — Telehealth: Payer: Self-pay

## 2021-12-05 NOTE — Telephone Encounter (Signed)
-----   Message from [place], PA-C sent at 12/04/2021  1:04 PM EDT ----- ?Echo showed improved pump function to 40-45% ?

## 2021-12-05 NOTE — Telephone Encounter (Signed)
I will fax these notes to requesting office to please see notes from Dr. Saunders Revel and Melina Copa, PAC.  ?

## 2021-12-05 NOTE — Telephone Encounter (Signed)
Attempted to contact the patient with echo results. ?Mobile number does not ring. Msg sts that the voicemail has not been set up. ?Home number rings and then a msg sts that the call cannot be completed at this time. ?

## 2021-12-05 NOTE — Telephone Encounter (Signed)
Patient is s resident at [facility] center. ?Spoke with the nurse taking care of him  [place], Therapist, sports.Marland Kitchen ?Bethann Berkshire made aware of the patient echo results. ?She will make the patient aware of the results as well. ?

## 2021-12-05 NOTE — Telephone Encounter (Signed)
Follow Up: ? ? ? ? ?Patient is calling back, concerning his Echo results. ?

## 2021-12-06 NOTE — Telephone Encounter (Addendum)
Will route to Cadence to review and address at upcoming [place] in May. This is also included in appt notes. Will otherwise remove from preop box. ?

## 2021-12-11 ENCOUNTER — Ambulatory Visit (HOSPITAL_BASED_OUTPATIENT_CLINIC_OR_DEPARTMENT_OTHER): Payer: Medicaid Other | Admitting: Family

## 2021-12-12 ENCOUNTER — Encounter: Payer: Self-pay | Admitting: Physical Medicine and Rehabilitation

## 2021-12-13 ENCOUNTER — Ambulatory Visit: Payer: Medicaid Other | Admitting: Internal Medicine

## 2021-12-16 NOTE — Progress Notes (Signed)
?Cardiology Office Note:   ? ?Date:  12/18/2021  ? ?ID:  [name], DOB February 19, 1971, MRN [id number] ? ?PCP:  Coral Spikes, DO  ?[facility] Cardiologist:  Larae Grooms, MD  ?[facility] Electrophysiologist:  None  ? ?Referring MD: Raelene Bott, MD  ? ?Chief Complaint: pre-op ? ?History of Present Illness:   ? ?[name] is a 51 y.o. male with a hx of with a hx of CAD s/p multiple PCIs with STEMI in 01/2021 due ti simultaneous late stent thrombosis in the mid RCA as well as occlusion of the Lcx complicated by cardiogenic shock, HFrEF due to ischemic cardiomyopathy, right lower extremity amputation, HTN, HLD, diabetes mellitis, and cervical myelopathy who presents for 1 month follow-up.  ?  ?Admitted 01/2021 for STEMI. He presented to [facility] after a syncopal episode.  When he was found by EMS, EKG showed ST elevation.  He underwent aspiration thrombectomy and PTCA of the mid RCA as well as stenting of the mid LCx.  He had transient complete heart block in the setting of his MI, which resolved with revascularization.  LVEF was 40% by echo.  He also has transient atrial fibrillation with rapid ventricular response during the PCI.  He was noted to have bilateral hand weakness during his hospitalization with concern for worsening cervical myelopathy. ? ?Patient underwent elective surgery (for cervical myelopathy) in 04/2021 and Brilinta was held for 5 days. Aspirin was continued through the peri-operative period. There were no subsequent complications.  ? ?Last seen 11/01/21 and was overall doing well from a cardiac standpoint. Spironolactone was added.  ? ?Today, the patient reports he is having carpal tunnel surgery on both hands. Reports severe disability due to nerve issue. Date was set, but this was canceled for pre-op eval. No chest pain, SOB, orthopnea, pnd. Has LLE on exam. No palpitations. EKG with no changes. He is in a wheelchair with poor functional baseline, able to do minimal to moderate  house work.  ? ?Past Medical History:  ?Diagnosis Date  ? CHF (congestive heart failure) ([place])   ? Coronary artery disease   ? Diabetes mellitus without complication ([place])   ? Ischemic cardiomyopathy   ? Myocardial infarction [facility])   ? inferior STEMI ~ 2016; 01/19/21 with CHB/cardiogenic shock requiring brief TVP, IABP, s/p aspiration thrombectomy/ballon angioplasty for late thrombosis mRCA stent & DES for acute occlusion mLCX  ? ? ?Past Surgical History:  ?Procedure Laterality Date  ? AMPUTATION Right 10/01/2020  ? Procedure: AMPUTATION 5th  RAY AND IRRIGATION AND DEBRIDEMENT;  Surgeon: Samara Deist, DPM;  Location: [facility];  Service: Podiatry;  Laterality: Right;  ? AMPUTATION Right 10/11/2020  ? Procedure: AMPUTATION RAY;  Surgeon: Caroline More, DPM;  Location: [facility];  Service: Podiatry;  Laterality: Right;  ? ANTERIOR CERVICAL DECOMPRESSION/DISCECTOMY FUSION 4 LEVELS N/A 04/25/2021  ? Procedure: Cervical Four-Five, Cervical Five-Six, Cervical Six-Seven, Cervical Seven- Thoracic One  Anterior cervical decompression/Discectomy/fusion;  Surgeon: Judith Part, MD;  Location: [place];  Service: Neurosurgery;  Laterality: N/A;  Cervical Four-Five, Cervical Five-Six, Cervical Six-Seven, Cervical Seven- Thoracic One  Anterior cervical decompression/Discectomy/fusion  ? BACK SURGERY    ? CARDIAC CATHETERIZATION    ? CORONARY THROMBECTOMY N/A 01/19/2021  ? Procedure: Coronary Thrombectomy;  Surgeon: Jettie Booze, MD;  Location: [place] CV LAB;  Service: Cardiovascular;  Laterality: N/A;  ? CORONARY/GRAFT ACUTE MI REVASCULARIZATION N/A 01/19/2021  ? Procedure: Coronary/Graft Acute MI Revascularization;  Surgeon: Jettie Booze, MD;  Location: [place]  CV LAB;  Service: Cardiovascular;  Laterality: N/A;  ? IABP INSERTION N/A 01/19/2021  ? Procedure: IABP Insertion;  Surgeon: Jettie Booze, MD;  Location: [place] CV LAB;  Service: Cardiovascular;  Laterality: N/A;  ? INCISION AND  DRAINAGE Right 09/29/2020  ? Procedure: INCISION AND DRAINAGE, RIGHT FOOT;  Surgeon: Samara Deist, DPM;  Location: [facility];  Service: Podiatry;  Laterality: Right;  ? LEFT HEART CATH AND CORONARY ANGIOGRAPHY N/A 01/19/2021  ? Procedure: LEFT HEART CATH AND CORONARY ANGIOGRAPHY;  Surgeon: Jettie Booze, MD;  Location: [place] CV LAB;  Service: Cardiovascular;  Laterality: N/A;  ? LOWER EXTREMITY ANGIOGRAPHY Right 10/03/2020  ? Procedure: Lower Extremity Angiography;  Surgeon: Katha Cabal, MD;  Location: [place] CV LAB;  Service: Cardiovascular;  Laterality: Right;  ? TENDON LENGTHENING Right 10/11/2020  ? Procedure: TENDON LENGTHENING;  Surgeon: Caroline More, DPM;  Location: [facility];  Service: Podiatry;  Laterality: Right;  ? ? ?Current Medications: ?Current Meds  ?Medication Sig  ? acetaminophen (TYLENOL) 650 MG CR tablet Take 650 mg by mouth every 4 (four) hours as needed (pain). Do not exceed 3g/day  ? albuterol (VENTOLIN HFA) 108 (90 Base) MCG/ACT inhaler Inhale 2 puffs into the lungs every 6 (six) hours as needed for wheezing or shortness of breath.  ? ammonium lactate (LAC-HYDRIN) 12 % lotion Apply 1 application. topically in the morning and at bedtime. Apply to toenails for nail fungus  ? aspirin EC 81 MG EC tablet Take 1 tablet (81 mg total) by mouth daily. Swallow whole.  ? atorvastatin (LIPITOR) 80 MG tablet Take 1 tablet (80 mg total) by mouth daily.  ? baclofen (LIORESAL) 10 MG tablet Take 10 mg by mouth 3 (three) times daily.  ? bismuth subsalicylate (PEPTO BISMOL) 262 MG/15ML suspension Take 30 mLs by mouth every 4 (four) hours as needed for diarrhea or loose stools.  ? Dextrose, Diabetic Use, (INSTA-GLUCOSE) 77.4 % GEL Take 1 Dose by mouth as needed (for BG less than 70).  ? empagliflozin (JARDIANCE) 10 MG TABS tablet Take 10 mg by mouth daily.  ? fluticasone (FLONASE) 50 MCG/ACT nasal spray Place 1 spray into both nostrils daily as needed for allergies.  ? furosemide  (LASIX) 20 MG tablet Take 20 mg by mouth daily.  ? glipiZIDE (GLUCOTROL) 10 MG tablet Take 10 mg by mouth daily.  ? insulin aspart (NOVOLOG) 100 UNIT/ML injection Inject 0-15 Units into the skin 3 (three) times daily with meals. Do NOT hold if patient is NPO. ?Moderate Scale. (Patient taking differently: Inject 8 Units into the skin 3 (three) times daily with meals. Do NOT hold if patient is NPO. ?Moderate Scale.  151-200, 3 units; 201-250, 6 units; 251-300, 9 units; 301-350, 12 units, 351-400, 15 units; >400, give 15 units, and recheck in 1 hour. If still >400, Notify MD)  ? insulin glargine (LANTUS SOLOSTAR) 100 UNIT/ML Solostar Pen Inject 30 Units into the skin at bedtime.  ? loperamide (IMODIUM) 2 MG capsule Take 2 mg by mouth every 8 (eight) hours as needed for diarrhea or loose stools.  ? losartan (COZAAR) 25 MG tablet Take 0.5 tablets (12.5 mg total) by mouth at bedtime.  ? melatonin 3 MG TABS tablet Take 1 tablet (3 mg total) by mouth at bedtime.  ? Menthol, Topical Analgesic, (BIOFREEZE) 4 % GEL Apply 1 application topically every 8 (eight) hours as needed (pain).  ? nitroGLYCERIN (NITROSTAT) 0.4 MG SL tablet Place 1 tablet (0.4 mg total) under the tongue  every 5 (five) minutes x 3 doses as needed for chest pain.  ? oxyCODONE (OXY IR/ROXICODONE) 5 MG immediate release tablet Take 1 tablet (5 mg total) by mouth every 4 (four) hours as needed (pain).  ? polyethylene glycol (MIRALAX / GLYCOLAX) 17 g packet Take 17 g by mouth daily as needed for moderate constipation or severe constipation.  ? potassium chloride (MICRO-K) 10 MEQ CR capsule Take 2 capsules (20 mEq total) by mouth daily.  ? pregabalin (LYRICA) 150 MG capsule Take 150 mg by mouth 3 (three) times daily.  ? senna-docusate (SENOKOT-S) 8.6-50 MG tablet Take 2 tablets by mouth at bedtime as needed for mild constipation.  ? tamsulosin (FLOMAX) 0.4 MG CAPS capsule Take 0.4 mg by mouth at bedtime.  ? ticagrelor (BRILINTA) 90 MG TABS tablet Take 1 tablet  (90 mg total) by mouth 2 (two) times daily.  ? traZODone (DESYREL) 50 MG tablet Take 50 mg by mouth at bedtime.  ? [DISCONTINUED] spironolactone (ALDACTONE) 25 MG tablet Take 0.5 tablets (12.5 mg total) by m

## 2021-12-17 ENCOUNTER — Encounter: Payer: Self-pay | Admitting: Medical

## 2021-12-17 ENCOUNTER — Ambulatory Visit (INDEPENDENT_AMBULATORY_CARE_PROVIDER_SITE_OTHER): Payer: Medicaid Other | Admitting: Medical

## 2021-12-17 VITALS — BP 84/54 | HR 91 | Ht 74.0 in | Wt 222.0 lb

## 2021-12-17 DIAGNOSIS — Z01810 Encounter for preprocedural cardiovascular examination: Secondary | ICD-10-CM

## 2021-12-17 DIAGNOSIS — I2510 Atherosclerotic heart disease of native coronary artery without angina pectoris: Secondary | ICD-10-CM

## 2021-12-17 DIAGNOSIS — I5022 Chronic systolic (congestive) heart failure: Secondary | ICD-10-CM

## 2021-12-17 NOTE — Patient Instructions (Signed)
Medication Instructions:  ?Your physician has recommended you make the following change in your medication:  ? ?STOP Spironolactone ? ?*If you need a refill on your cardiac medications before your next appointment, please call your pharmacy* ? ? ?Lab Work: ?None ? ?If you have labs (blood work) drawn today and your tests are completely normal, you will receive your results only by: ?MyChart Message (if you have MyChart) OR ?A paper copy in the mail ?If you have any lab test that is abnormal or we need to change your treatment, we will call you to review the results. ? ? ?Testing/Procedures: ?None ? ? ?Follow-Up: ?At [facility], you and your health needs are our priority.  As part of our continuing mission to provide you with exceptional heart care, we have created designated Provider Care Teams.  These Care Teams include your primary Cardiologist (physician) and Advanced Practice Providers (APPs -  Physician Assistants and Nurse Practitioners) who all work together to provide you with the care you need, when you need it. ? ?We recommend signing up for the patient portal called "MyChart".  Sign up information is provided on this After Visit Summary.  MyChart is used to connect with patients for Virtual Visits (Telemedicine).  Patients are able to view lab/test results, encounter notes, upcoming appointments, etc.  Non-urgent messages can be sent to your provider as well.   ?To learn more about what you can do with MyChart, go to [email or web address].   ? ?Your next appointment:   ?3 month(s) ? ?The format for your next appointment:   ?In Person ? ?Provider:   ?Nelva Bush, MD or Cadence Kathlen Mody, PA-C  ? ? ? ? ? ?Important Information About Sugar ? ? ? ? ?  ?

## 2021-12-19 ENCOUNTER — Emergency Department: Payer: Self-pay

## 2021-12-19 ENCOUNTER — Inpatient Hospital Stay (HOSPITAL_COMMUNITY)
Admission: EM | Admit: 2021-12-19 | Discharge: 2021-12-19 | DRG: 919 | Disposition: A | Discharge status: Other Acute Inpatient Hospital | Payer: Medicaid Other | Attending: Pulmonary Disease | Admitting: Pulmonary Disease

## 2021-12-19 ENCOUNTER — Other Ambulatory Visit: Payer: Self-pay

## 2021-12-19 ENCOUNTER — Encounter (HOSPITAL_COMMUNITY): Payer: Self-pay | Admitting: Pulmonary Disease

## 2021-12-19 ENCOUNTER — Inpatient Hospital Stay (HOSPITAL_COMMUNITY): Payer: Medicaid Other

## 2021-12-19 DIAGNOSIS — Z794 Long term (current) use of insulin: Secondary | ICD-10-CM | POA: Diagnosis not present

## 2021-12-19 DIAGNOSIS — Z8249 Family history of ischemic heart disease and other diseases of the circulatory system: Secondary | ICD-10-CM | POA: Diagnosis not present

## 2021-12-19 DIAGNOSIS — I2510 Atherosclerotic heart disease of native coronary artery without angina pectoris: Secondary | ICD-10-CM | POA: Diagnosis present

## 2021-12-19 DIAGNOSIS — F17210 Nicotine dependence, cigarettes, uncomplicated: Secondary | ICD-10-CM | POA: Diagnosis present

## 2021-12-19 DIAGNOSIS — Z955 Presence of coronary angioplasty implant and graft: Secondary | ICD-10-CM | POA: Diagnosis not present

## 2021-12-19 DIAGNOSIS — Z7982 Long term (current) use of aspirin: Secondary | ICD-10-CM | POA: Diagnosis not present

## 2021-12-19 DIAGNOSIS — E785 Hyperlipidemia, unspecified: Secondary | ICD-10-CM | POA: Diagnosis present

## 2021-12-19 DIAGNOSIS — M40209 Unspecified kyphosis, site unspecified: Secondary | ICD-10-CM | POA: Diagnosis present

## 2021-12-19 DIAGNOSIS — Y792 Prosthetic and other implants, materials and accessory orthopedic devices associated with adverse incidents: Secondary | ICD-10-CM | POA: Diagnosis present

## 2021-12-19 DIAGNOSIS — Z833 Family history of diabetes mellitus: Secondary | ICD-10-CM

## 2021-12-19 DIAGNOSIS — I252 Old myocardial infarction: Secondary | ICD-10-CM

## 2021-12-19 DIAGNOSIS — I5022 Chronic systolic (congestive) heart failure: Secondary | ICD-10-CM | POA: Diagnosis present

## 2021-12-19 DIAGNOSIS — R682 Dry mouth, unspecified: Secondary | ICD-10-CM | POA: Diagnosis present

## 2021-12-19 DIAGNOSIS — K223 Perforation of esophagus: Secondary | ICD-10-CM | POA: Diagnosis present

## 2021-12-19 DIAGNOSIS — J9851 Mediastinitis: Secondary | ICD-10-CM | POA: Diagnosis present

## 2021-12-19 DIAGNOSIS — T85898A Other specified complication of other internal prosthetic devices, implants and grafts, initial encounter: Secondary | ICD-10-CM | POA: Diagnosis present

## 2021-12-19 DIAGNOSIS — Z79899 Other long term (current) drug therapy: Secondary | ICD-10-CM

## 2021-12-19 DIAGNOSIS — E119 Type 2 diabetes mellitus without complications: Secondary | ICD-10-CM | POA: Diagnosis present

## 2021-12-19 DIAGNOSIS — R1310 Dysphagia, unspecified: Secondary | ICD-10-CM | POA: Diagnosis present

## 2021-12-19 DIAGNOSIS — I110 Hypertensive heart disease with heart failure: Secondary | ICD-10-CM | POA: Diagnosis present

## 2021-12-19 LAB — COMPREHENSIVE METABOLIC PANEL
ALT: 21 U/L (ref 0–44)
AST: 28 U/L (ref 15–41)
Albumin: 2.3 g/dL — ABNORMAL LOW (ref 3.5–5.0)
Alkaline Phosphatase: 146 U/L — ABNORMAL HIGH (ref 38–126)
Anion gap: 13 (ref 5–15)
BUN: 40 mg/dL — ABNORMAL HIGH (ref 6–20)
CO2: 21 mmol/L — ABNORMAL LOW (ref 22–32)
Calcium: 8.6 mg/dL — ABNORMAL LOW (ref 8.9–10.3)
Chloride: 106 mmol/L (ref 98–111)
Creatinine, Ser: 1.69 mg/dL — ABNORMAL HIGH (ref 0.61–1.24)
GFR, Estimated: 49 mL/min — ABNORMAL LOW (ref >60)
Glucose, Bld: 208 mg/dL — ABNORMAL HIGH (ref 70–99)
Potassium: 4.4 mmol/L (ref 3.5–5.1)
Sodium: 140 mmol/L (ref 135–145)
Total Bilirubin: 1.2 mg/dL (ref 0.3–1.2)
Total Protein: 7.2 g/dL (ref 6.5–8.1)

## 2021-12-19 LAB — CBC
HCT: 39.5 % (ref 39.0–52.0)
Hemoglobin: 12.7 g/dL — ABNORMAL LOW (ref 13.0–17.0)
MCH: 30.3 pg (ref 26.0–34.0)
MCHC: 32.2 g/dL (ref 30.0–36.0)
MCV: 94.3 fL (ref 80.0–100.0)
Platelets: 152 10*3/uL (ref 150–400)
RBC: 4.19 MIL/uL — ABNORMAL LOW (ref 4.22–5.81)
RDW: 14.7 % (ref 11.5–15.5)
WBC: 14.5 10*3/uL — ABNORMAL HIGH (ref 4.0–10.5)
nRBC: 0 % (ref 0.0–0.2)

## 2021-12-19 LAB — APTT: aPTT: 22 seconds — ABNORMAL LOW (ref 24–36)

## 2021-12-19 LAB — HEMOGLOBIN A1C
Hgb A1c MFr Bld: 8.7 % — ABNORMAL HIGH (ref 4.8–5.6)
Mean Plasma Glucose: 202.99 mg/dL

## 2021-12-19 LAB — PROTIME-INR
INR: 1.1 (ref 0.8–1.2)
Prothrombin Time: 14 seconds (ref 11.4–15.2)

## 2021-12-19 LAB — BRAIN NATRIURETIC PEPTIDE: B Natriuretic Peptide: 233.4 pg/mL — ABNORMAL HIGH (ref 0.0–100.0)

## 2021-12-19 LAB — LACTIC ACID, PLASMA: Lactic Acid, Venous: 2.6 mmol/L (ref 0.5–1.9)

## 2021-12-19 LAB — PROCALCITONIN: Procalcitonin: 73.2 ng/mL

## 2021-12-19 LAB — MAGNESIUM: Magnesium: 1.9 mg/dL (ref 1.7–2.4)

## 2021-12-19 LAB — CBG MONITORING, ED: Glucose-Capillary: 201 mg/dL — ABNORMAL HIGH (ref 70–99)

## 2021-12-19 MED ORDER — VANCOMYCIN HCL IN DEXTROSE 1-5 GM/200ML-% IV SOLN: 1000.0000 mg | INTRAVENOUS | Status: DC

## 2021-12-19 MED ORDER — PANTOPRAZOLE SODIUM 40 MG IV SOLR: 40.0000 mg | Freq: Every day | INTRAVENOUS | Status: DC

## 2021-12-19 MED ORDER — SODIUM CHLORIDE 0.9 % IV SOLN
250.0000 mL | INTRAVENOUS | Status: DC
  Administered 2021-12-19: 250 mL via INTRAVENOUS

## 2021-12-19 MED ORDER — ACETAMINOPHEN 650 MG RE SUPP: 650.0000 mg | Freq: Four times a day (QID) | RECTAL | Status: DC | PRN

## 2021-12-19 MED ORDER — ALBUTEROL SULFATE (2.5 MG/3ML) 0.083% IN NEBU: 2.5000 mg | INHALATION_SOLUTION | RESPIRATORY_TRACT | 12 refills | Status: AC | PRN

## 2021-12-19 MED ORDER — VANCOMYCIN IV (FOR PTA / DISCHARGE USE ONLY): 1000.0000 mg | Freq: Three times a day (TID) | INTRAVENOUS | Status: DC

## 2021-12-19 MED ORDER — INSULIN ASPART 100 UNIT/ML IJ SOLN
0.0000 [IU] | INTRAMUSCULAR | 11 refills | Status: AC
Start: 2021-12-20 — End: ?

## 2021-12-19 MED ORDER — SODIUM CHLORIDE 0.9 % IV SOLN
1.0000 g | Freq: Three times a day (TID) | INTRAVENOUS | Status: DC
  Filled 2021-12-19 (×2): qty 20

## 2021-12-19 MED ORDER — ACETAMINOPHEN 650 MG RE SUPP: 650.0000 mg | Freq: Four times a day (QID) | RECTAL | 0 refills | Status: DC | PRN

## 2021-12-19 MED ORDER — VANCOMYCIN HCL 10 G IV SOLR
2250.0000 mg | Freq: Once | INTRAVENOUS | Status: AC
  Administered 2021-12-19: 2250 mg via INTRAVENOUS
  Filled 2021-12-19: qty 45

## 2021-12-19 MED ORDER — INSULIN ASPART 100 UNIT/ML IJ SOLN
0.0000 [IU] | INTRAMUSCULAR | Status: DC
  Administered 2021-12-19: 3 [IU] via SUBCUTANEOUS

## 2021-12-19 MED ORDER — SODIUM CHLORIDE 0.9 % IV SOLN: 1.0000 g | Freq: Three times a day (TID) | INTRAVENOUS | Status: DC

## 2021-12-19 MED ORDER — MEROPENEM IV (FOR PTA / DISCHARGE USE ONLY): 500.0000 mg | Freq: Three times a day (TID) | INTRAVENOUS | Status: DC

## 2021-12-19 MED ORDER — NOREPINEPHRINE 4 MG/250ML-% IV SOLN
2.0000 ug/min | INTRAVENOUS | Status: DC
  Administered 2021-12-19: 2 ug/min via INTRAVENOUS
  Filled 2021-12-19: qty 250

## 2021-12-19 MED ORDER — NOREPINEPHRINE 4 MG/250ML-% IV SOLN: 2.0000 ug/min | INTRAVENOUS | Status: DC

## 2021-12-19 MED ORDER — SODIUM CHLORIDE 0.9 % IV SOLN: 250.0000 mL | INTRAVENOUS | 0 refills | Status: DC

## 2021-12-19 MED ORDER — ALBUTEROL SULFATE (2.5 MG/3ML) 0.083% IN NEBU: 2.5000 mg | INHALATION_SOLUTION | RESPIRATORY_TRACT | Status: DC | PRN

## 2021-12-19 NOTE — Progress Notes (Signed)
Pharmacy Antibiotic Note ? ?Valdis Bevill. is a 51 y.o. male admitted on 12/19/2021 with sepsis and esophageal perforation .  Pharmacy has been consulted for Meropenem and Vancomycin dosing. ? ?Plan: ?Vancomycin 2250 mg IV x 1, followed by 1000 mg IV q12h (eAUC 508, Scr 1.69, goal AUC 400-550) ?Meropenem 1 g IV q8h  ?Follow-up clinical status, renal function ?Follow-up cultures, LOT, narrow as able ?Obtain Vancomycin levels as appropriate  ?  ? ?No data recorded. ? ?Recent Labs  ?Lab 12/19/21 ?1831  ?WBC 14.5*  ?CREATININE 1.69*  ?  ?Estimated Creatinine Clearance: 65.5 mL/min (A) (by C-G formula based on SCr of 1.69 mg/dL (H)).   ? ?No Known Allergies ? ?Antimicrobials this admission: ?Vancomycin 5/3 >>  ?Meropenem 5/3 >>  ? ?Microbiology results: ?5/3 BCx: pending ?5/3 UCx: pending  ? ? ?Thank you for allowing pharmacy to be a part of this patient?s care. ? ?Vance Peper, PharmD ?PGY1 Pharmacy Resident ?12/19/2021 7:46 PM  ? ?Please check AMION for all [place] phone numbers ?After 10:00 PM, call [place] [phone] ? ? ?

## 2021-12-19 NOTE — Progress Notes (Signed)
Patient arrived to 4NICU, Dr. Loni Muse arrived at bedside, pt received a bed at [facility]. Report called and given to [facility] nurse Levada Dy). ?

## 2021-12-19 NOTE — ED Notes (Signed)
IV Team at bedside 

## 2021-12-19 NOTE — ED Notes (Signed)
Patient using suction as needed. ?

## 2021-12-19 NOTE — Consult Note (Signed)
Neurosurgery Consultation ? ?Reason for Consult: Post-op dysphagia ?Referring Physician: Tyrone Nine ? ?CC: Dysphagia ? ?HPI: This is a 51 y.o. man, well known to me, with a complex cardiac history in whom I previously performed a C4-T1 ACDF. He now presents with 3 days of dysphagia and fatigue. He has some baseline mild neck pain that isn't worse, denies any trauma or falls, denies any worsening of his weakness except for some global worsening given his fatigue. He denies any chest pain, does have chronic SOB due to his prior cardiac disease but doesn't feel that the SOB is worsened, denies pressure sensation in the neck or difficulty breathing / moving air.  ? ?ROS: A 14 point ROS was performed and is negative except as noted in the HPI.  ? ?PMHx:  ?Past Medical History:  ?Diagnosis Date  ? CHF (congestive heart failure) ([place])   ? Coronary artery disease   ? Diabetes mellitus without complication ([place])   ? Ischemic cardiomyopathy   ? Myocardial infarction [facility])   ? inferior STEMI ~ 2016; 01/19/21 with CHB/cardiogenic shock requiring brief TVP, IABP, s/p aspiration thrombectomy/ballon angioplasty for late thrombosis mRCA stent & DES for acute occlusion mLCX  ? ?FamHx:  ?Family History  ?Problem Relation Age of Onset  ? Breast cancer Mother   ? Heart attack Father   ? Diabetes Father   ? Heart disease Father   ? Lung cancer Paternal Grandmother   ? ?SocHx:  reports that he has been smoking cigarettes. He has a 33.00 pack-year smoking history. He has never used smokeless tobacco. He reports that he does not currently use alcohol. He reports that he does not use drugs. ? ?Exam: ?Vital signs in last 24 hours: ?Pulse Rate:  [108] 108 (05/03 1715) ?Resp:  [10] 10 (05/03 1715) ?BP: (107)/(63) 107/63 (05/03 1715) ?SpO2:  [96 %-97 %] 96 % (05/03 1715) ?General: Awake, alert, cooperative, lying in bed, appears acutely ill ?Head: Normocephalic and atruamatic ?HEENT: Some redness in the anterior cervical skin with some swelling, no  obvious crepitus on palpation, pt conversing but voice does sound hoarser than his baseline, no stridor, using suction for saliva ?Pulmonary: breathing room air comfortably, no evidence of increased work of breathing ?Cardiac: tachycardic, regular  ?Abdomen: S NT ND ?Extremities: Warm and well perfused x4 ?Neuro: Strength 4/5 in bilateral upper extremities, stable focal weakness in FCU / FDP / Grip / hand intrinsics, diffuse b/l stocking-glove numbness worse in the right than left median > ulnar distributions in the hands ? ? ?Assessment and Plan: 51 y.o. man s/p prior ACDF for central cord syndrome / severe myelopathy, now 7 months post-op with 3 days of dysphagia. 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 There is a right sided fluid collection and free air, findings concerning for delayed esophageal perforation with abscess causing or due to hardware failure. 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  ? ?-discussed w/ Dr. Tyrone Nine, ENT does not feel comfortable managing this here, will require transfer, which I think is reasonable given it would be a complex repair. I discussed this with the patient, the serious nature of this pathology, and that it would be up to the treating surgeon but potentially would require hardware removal and further cervical surgery. I am happy to provide sign-out to the neurosurgery team that is involved with his care.  ?-would start broad spectrum antibiotics  to cover mediastinitis, I believe they were already started but just aren't charted yet in Epic, close hemodynamic monitoring as mediastinitis can obviously become serious quite quickly, keep NPO ? ?Frank Part, MD ?12/19/21 ?6:11 PM ?[facility] ? ?

## 2021-12-19 NOTE — H&P (Signed)
? ?NAME:  [name]., MRN:  [id number], DOB:  1971/05/14, LOS: 0 ?ADMISSION DATE:  12/19/2021, CONSULTATION DATE:  12/19/2021 ?REFERRING MD:  Dr. Tyrone Nine - EDP CHIEF COMPLAINT:  Esophageal perforation  ? ?History of Present Illness:  ?51 year old man who presented to [facility] ED with 3-day history of difficulty swallowing. Found on CT Chest/A/P evaluation to have esophageal perforation with displacement of C-spine hardware into his esophagus with subsequent abscess.  PMHx significant for HTN, HLD, tobacco abuse, CAD (s/p PCI/stenting 01/2021), HFrEF with ICM (Echo 05/2021 with 30-35%, +RWMAs), IDDM, RLE ray amputations (09/2020), cervical myelopathy s/p C4-T1 ACDF (04/2021, Dr. Zada Finders). ? ?On ED arrivals, patient was tachycardic to 110s-120s and hypotensive with SBP 90s for which peripheral Levophed was initiated. Labs were notable for LA 3.0, sCr 1.4, WBC 8.8, Hgb 13, BNP 2200 but negative troponin.  ? ?Given prior ACDF performed at [facility], patient was transferred to [facility] ED for further evaluation and management. NSGY, TCTS, ENT consulted as well. Per ENT, patient more appropriate for a tertiary/academic medical center with transfer requested. ? ?PCCM consulted for ICU admission while awaiting transfer. ? ?Pertinent Medical History:  ? ?Past Medical History:  ?Diagnosis Date  ? CHF (congestive heart failure) ([place])   ? Coronary artery disease   ? Diabetes mellitus without complication ([place])   ? Ischemic cardiomyopathy   ? Myocardial infarction [facility])   ? inferior STEMI ~ 2016; 01/19/21 with CHB/cardiogenic shock requiring brief TVP, IABP, s/p aspiration thrombectomy/ballon angioplasty for late thrombosis mRCA stent & DES for acute occlusion mLCX  ? ?Significant Hospital Events: ?Including procedures, antibiotic start and stop dates in addition to other pertinent events   ?5/3 admitted to [facility]; esophageal perf; on levo; pccm admit to icu ? ?Interim History / Subjective:  ?Patient feeling ok overall, reports thirst/dry  mouth and inability to swallow. ?Denies pain other than R arm (positional) ?NSGY, TCTS, ENT consulted ?Per ENT, patient more appropriate for a tertiary/academic medical center with transfer requested. ? ?PCCM consulted for ICU admission while awaiting transfer. ? ?Objective:  ?Blood pressure 107/63, pulse (!) 108, resp. rate 10, SpO2 96 %. ?   ?   ?No intake or output data in the 24 hours ending 12/19/21 1758 ?There were no vitals filed for this visit. ? ?Physical Examination: ?General: Acute-on-chronically ill-appearing middle aged man in NAD. Appears uncomfortable. ?HEENT: [place]/AT, anicteric sclera, PERRL, dry mucous membranes. ?Neuro: Awake, oriented x 4. Responds to verbal stimuli. Following commands consistently. Moves all 4 extremities spontaneously. ?CV: Tachycardic, regular rhythm, no m/g/r. Heart sounds slightly distant. ?PULM: Breathing even and unlabored on 2LNC. Lung fields diminished bilaterally. ?GI: Soft, nontender, nondistended. Normoactive bowel sounds. ?Extremities: Bilateral symmetric 1+ chronic LE edema noted. ?Skin: Warm/dry, discoloration of BLE 2/2 venous stasis. ? ?Resolved Hospital Problem list   ? ? ?Assessment & Plan:  ?Esophageal perforation w/ abscess ?P: ?- ENT/TCTS consulted; appreciate recs ?- Start empiric abx: meropenem/vanc ?- STRICT NPO ?- Plan for transfer to tertiary/academic center for higher level of care due to nature of esophaegeal repair/mediastinal abscess ? ?Shock: likely septic ?P: ?- Admit to ICU w/ continuous telemetry ?- Continue levo for MAP goal >65 ?- Abx as above ?- Check PCT ?- F/u CXR ?- F/u Cx data (BCx, UCx/UA, sputum) ?- Trend LA ?- Trend CBC/fever curve ?- Repeat Echo, given BNP 2200 ? ?Central cord syndrome/severe cervical myelopathy: s/p C4-T1 ACDF 04/2021 by Dr. Zada Finders ?P: ?- Management per NSGY ?- Decision not to pursue surgical intervention, given pending transfer  request ? ?AKI ?AG met acidosis ?P: ?- Trend BMP ?- Replete electrolytes as indicated ?-  Monitor I&Os ?- F/u urine studies ?- Avoid nephrotoxic agents as able ?- Ensure adequate renal perfusion ? ?Acute on chronic HFrEF: echo 05/2021: EF 30-35% ?HTN/HLD ?CAD: multiple PCIs STEMI 01/2021 ?P: ?- Hold anti-hypertensives ?- Strict I/o's; daily weights ?- F/u Echo ?- Consider restarting home ASA and brilinta when clinically appropriate ; will hold for now in setting of surgery and while NPO ?- Continue statin when able to take PO ? ?Hyperglycemia ?IDDM ?Peripheral neuropathy? ?P: ?-ssi and cbg monitoring ?-check a1c ?-hold home lyrica ? ?Hx of BPH ?P: ?-hold home meds ? ?Best Practice (right click and "Reselect all SmartList Selections" daily)  ? ?Diet/type: NPO, STRICT ?DVT prophylaxis: SCDs ?GI prophylaxis: PPI ?Lines: N/A ?Foley:  N/A ?Code Status:  full code ?Last date of multidisciplinary goals of care discussion [Pending] ? ?Labs   ?CBC: ?No results for input(s): WBC, NEUTROABS, HGB, HCT, MCV, PLT in the last 168 hours. ? ?Basic Metabolic Panel: ?No results for input(s): NA, K, CL, CO2, GLUCOSE, BUN, CREATININE, CALCIUM, MG, PHOS in the last 168 hours. ?GFR: ?CrCl cannot be calculated (Patient's most recent lab result is older than the maximum 21 days allowed.). ?No results for input(s): PROCALCITON, WBC, LATICACIDVEN in the last 168 hours. ? ?Liver Function Tests: ?No results for input(s): AST, ALT, ALKPHOS, BILITOT, PROT, ALBUMIN in the last 168 hours. ?No results for input(s): LIPASE, AMYLASE in the last 168 hours. ?No results for input(s): AMMONIA in the last 168 hours. ? ?ABG: ?No results found for: PHART, PCO2ART, PO2ART, HCO3, TCO2, ACIDBASEDEF, O2SAT  ? ?Coagulation Profile: ?No results for input(s): INR, PROTIME in the last 168 hours. ? ?Cardiac Enzymes: ?No results for input(s): CKTOTAL, CKMB, CKMBINDEX, TROPONINI in the last 168 hours. ? ?HbA1C: ?Hgb A1c MFr Bld  ?Date/Time Value Ref Range Status  ?04/25/2021 10:01 AM 6.8 (H) 4.8 - 5.6 % Final  ?  Comment:  ?  (NOTE) ?Pre diabetes:           5.7%-6.4% ? ?Diabetes:              >6.4% ? ?Glycemic control for   <7.0% ?adults with diabetes ?  ?01/22/2021 07:33 AM 10.4 (H) 4.8 - 5.6 % Final  ?  Comment:  ?  (NOTE) ?        Prediabetes: 5.7 - 6.4 ?        Diabetes: >6.4 ?        Glycemic control for adults with diabetes: <7.0 ?  ? ?CBG: ?No results for input(s): GLUCAP in the last 168 hours. ? ?Review of Systems:   ?Review of systems completed with pertinent positives/negatives outlined in above HPI. ? ?Past Medical History:  ?He,  has a past medical history of CHF (congestive heart failure) ([place]), Coronary artery disease, Diabetes mellitus without complication ([place]), Ischemic cardiomyopathy, and Myocardial infarction ([place]).  ? ?Surgical History:  ? ?Past Surgical History:  ?Procedure Laterality Date  ? AMPUTATION Right 10/01/2020  ? Procedure: AMPUTATION 5th  RAY AND IRRIGATION AND DEBRIDEMENT;  Surgeon: Samara Deist, DPM;  Location: [facility] OR[facility];  Service: Podiatry;  Laterality: Right;  ? AMPUTATION Right 10/11/2020  ? Procedure: AMPUTATION RAY;  Surgeon: Caroline More, DPM;  Location: [facility] OR[facility];  Service: Podiatry;  Laterality: Right;  ? ANTERIOR CERVICAL DECOMPRESSION/DISCECTOMY FUSION 4 LEVELS N/A 04/25/2021  ? Procedure: Cervical Four-Five, Cervical Five-Six, Cervical Six-Seven, Cervical Seven- Thoracic One  Anterior cervical decompression/Discectomy/fusion;  Surgeon:  Judith Part, MD;  Location: [place];  Service: Neurosurgery;  Laterality: N/A;  Cervical Four-Five, Cervical Five-Six, Cervical Six-Seven, Cervical Seven- Thoracic One  Anterior cervical decompression/Discectomy/fusion  ? BACK SURGERY    ? CARDIAC CATHETERIZATION    ? CORONARY THROMBECTOMY N/A 01/19/2021  ? Procedure: Coronary Thrombectomy;  Surgeon: Jettie Booze, MD;  Location: [place] CV LAB;  Service: Cardiovascular;  Laterality: N/A;  ? CORONARY/GRAFT ACUTE MI REVASCULARIZATION N/A 01/19/2021  ? Procedure: Coronary/Graft Acute MI Revascularization;  Surgeon: Jettie Booze, MD;  Location: [place] CV LAB;  Service: Cardiovascular;  Laterality: N/A;  ? IABP INSERTION N/A 01/19/2021  ? Procedure: IABP Insertion;  Surgeon: Jettie Booze, MD;  Location: A

## 2021-12-19 NOTE — ED Triage Notes (Signed)
Pt BIB [facility] from SNF via [facility]. Patient surgical hardware has perforated the esophagus. Patient currently on 43mg levo, NAD, A&O x4. ?

## 2021-12-19 NOTE — Discharge Summary (Signed)
Physician Discharge Summary  ?Patient ID: ?[name]. ?MRN: [id number] ?DOB/AGE: December 13, 1970 51 y.o. ? ?Admit date: 12/19/2021 ?Discharge date: 12/19/2021 ? ?Admission Diagnoses: ? ?Discharge Diagnoses:  ?Principal Problem: ?  Esophageal perforation ? ? ?Discharged Condition: serious ? ?Hospital Course: This is a 51 y.o. man, well known to me, with a complex cardiac history in whom I previously performed a C4-T1 ACDF. He now presents with 3 days of dysphagia and fatigue. He has some baseline mild neck pain that isn't worse, denies any trauma or falls, denies any worsening of his weakness except for some global worsening given his fatigue. He denies any chest pain, does have chronic SOB due to his prior cardiac disease but doesn't feel that the SOB is worsened, denies pressure sensation in the neck or difficulty breathing / moving air.  ? ?Now 7 months post-op with 3 days of dysphagia. 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 There is a right sided fluid collection and free air, findings concerning for delayed esophageal perforation with abscess causing or due to hardware failure. 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 ? ?ENT here doesn't feel comfortable managing. [name] at [facility] has accepted in transfer. Risks and benefits of preemptively securing airway discussed but given current stability concerned that we risk delaying definitive care given logistics of organizing management of potentially difficult airway.  Risk discussed with patient, he would also prefer to move expeditiously to [facility].  ? ?Consults: pulmonary/intensive care ? ?Significant Diagnostic Studies: radiology: CT scan:   ? ?Treatments: IV hydration, antibiotics: Meropenem and vancomycin, and norepinephrine ? ?Discharge Exam: ?Blood pressure (!) 100/55, pulse 93, temperature 97.8 ?F (36.6  ?C), temperature source Oral, resp. rate 11, height 6' 2"  (1.88 m), weight 102.5 kg, SpO2 99 %. ?General appearance: alert, cooperative, mild distress, mildly obese, and toxic ?Head: Normocephalic, without obvious abnormality, atraumatic ?Eyes: conjunctivae/corneas clear. PERRL, EOM's intact. Fundi benign. ?Throat: hoarse voice throat congestion. ++ oral secretions.  ?Neck: no adenopathy, no carotid bruit, no JVD, supple, symmetrical, trachea midline, and thyroid not enlarged, symmetric, no tenderness/mass/nodules ?Resp: clear to auscultation bilaterally ?Cardio: regular rate and rhythm, S1, S2 normal, no murmur, click, rub or gallop ?Extremities: edema with chronic venous statis ?Neurologic: Grossly normal ? ?Disposition: Transfer to [facility], [name] accepting surgeon.  ? ?Discharge Instructions   ? ? Home infusion instructions   Complete by: As directed ?  ? Instructions: Flushing of vascular access device: 0.9% NaCl pre/post medication administration and prn patency; Heparin 100 u/ml, 69m for implanted ports and Heparin 10u/ml, 556mfor all other central venous catheters.  ? ?  ? ?Allergies as of 12/19/2021   ?No Known Allergies ?  ? ?  ?Medication List  ?  ? ?STOP taking these medications   ? ?acetaminophen 500 MG tablet ?Commonly known as: TYLENOL ?Replaced by: acetaminophen 650 MG suppository ?  ?albuterol 108 (90 Base) MCG/ACT inhaler ?Commonly known as: VENTOLIN HFA ?Replaced by: albuterol (2.5 MG/3ML) 0.083% nebulizer solution ?  ?ammonium lactate 12 % lotion ?Commonly known as: LAC-HYDRIN ?  ?aspirin 81 MG EC tablet ?  ?atorvastatin 80 MG tablet ?Commonly known as: LIPITOR ?  ?baclofen 10 MG tablet ?Commonly known as: LIORESAL ?  ?Biofreeze 4 % Gel ?Generic drug: Menthol (Topical Analgesic) ?  ?bismuth subsalicylate 26027GOZ/36UYuspension ?Commonly known as: PEPTO BISMOL ?  ?cephALEXin  500 MG capsule ?Commonly known as: KEFLEX ?  ?empagliflozin 10 MG Tabs tablet ?Commonly known as: JARDIANCE ?   ?fluticasone 50 MCG/ACT nasal spray ?Commonly known as: FLONASE ?  ?furosemide 20 MG tablet ?Commonly known as: LASIX ?  ?glipiZIDE 10 MG tablet ?Commonly known as: GLUCOTROL ?  ?Insta-Glucose 77.4 % Gel ?  ?Lantus SoloStar 100 UNIT/ML Solostar Pen ?Generic drug: insulin glargine ?  ?loperamide 2 MG capsule ?Commonly known as: IMODIUM ?  ?losartan 25 MG tablet ?Commonly known as: COZAAR ?  ?melatonin 3 MG Tabs tablet ?  ?nitroGLYCERIN 0.4 MG SL tablet ?Commonly known as: NITROSTAT ?  ?oxyCODONE 5 MG immediate release tablet ?Commonly known as: Oxy IR/ROXICODONE ?  ?polyethylene glycol 17 g packet ?Commonly known as: MIRALAX / GLYCOLAX ?  ?potassium chloride 10 MEQ CR capsule ?Commonly known as: MICRO-K ?  ?pregabalin 150 MG capsule ?Commonly known as: LYRICA ?  ?senna-docusate 8.6-50 MG tablet ?Commonly known as: Senokot-S ?  ?tamsulosin 0.4 MG Caps capsule ?Commonly known as: FLOMAX ?  ?ticagrelor 90 MG Tabs tablet ?Commonly known as: BRILINTA ?  ?traZODone 50 MG tablet ?Commonly known as: DESYREL ?  ? ?  ? ?TAKE these medications   ? ?acetaminophen 650 MG suppository ?Commonly known as: TYLENOL ?Place 1 suppository (650 mg total) rectally every 6 (six) hours as needed for fever or mild pain. ?Replaces: acetaminophen 500 MG tablet ?  ?albuterol (2.5 MG/3ML) 0.083% nebulizer solution ?Commonly known as: PROVENTIL ?Take 3 mLs (2.5 mg total) by nebulization every 4 (four) hours as needed for wheezing or shortness of breath. ?Replaces: albuterol 108 (90 Base) MCG/ACT inhaler ?  ?insulin aspart 100 UNIT/ML injection ?Commonly known as: novoLOG ?Inject 0-9 Units into the skin every 4 (four) hours. ?Start taking on: Dec 20, 2021 ?What changed:  ?how much to take ?when to take this ?additional instructions ?Another medication with the same name was removed. Continue taking this medication, and follow the directions you see here. ?  ?meropenem  IVPB ?Commonly known as: MERREM ?Inject 500 mg into the vein every 8 (eight)  hours. ?  ?meropenem 1 g in sodium chloride 0.9 % 100 mL ?Inject 1 g into the vein every 8 (eight) hours. ?  ?norepinephrine 4-5 MG/250ML-% Soln ?Commonly known as: LEVOPHED ?Inject 2-10 mcg/min into the vein continuous. ?  ?pantoprazole 40 MG injection ?Commonly known as: PROTONIX ?Inject 40 mg into the vein at bedtime. ?  ?sodium chloride 0.9 % infusion ?Inject 250 mLs into the vein continuous. ?  ?vancomycin  IVPB ?Inject 1,000 mg into the vein every 8 (eight) hours. ?  ?vancomycin 1-5 GM/200ML-% Soln ?Commonly known as: VANCOCIN ?Inject 200 mLs (1,000 mg total) into the vein daily. ?Start taking on: Dec 20, 2021 ?  ? ?  ? ?  ?  ? ? ?  ?Home Infusion Instuctions  ?(From admission, onward)  ?  ? ? ?  ? ?  Start     Ordered  ? 12/19/21 0000  Home infusion instructions       ?Question:  Instructions  Answer:  Flushing of vascular access device: 0.9% NaCl pre/post medication administration and prn patency; Heparin 100 u/ml, 81m for implanted ports and Heparin 10u/ml, 550mfor all other central venous catheters.  ? 12/19/21 2147  ? ?  ?  ? ?  ? ? ? ?Signed: ?RaKipp Brood5/10/2021, 9:47 PM ? ? ?

## 2021-12-19 NOTE — ED Provider Notes (Signed)
?[facility] ?Provider Note ? ? ?CSN: [id number] ?Arrival date & time: 12/19/21  1703 ? ?  ? ?History ? ?Chief Complaint  ?Patient presents with  ? Dysphagia  ?  perforated esophagus   ? ? ?Frank Perfect. is a 51 y.o. male. ? ?51 yo M with a chief complaints of difficulty swallowing.  Is been going on for about 3 days now.  The patient went to [facility] and was evaluated there.  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  He was then transferred here for evaluation.  I reviewed the records from there.  The patient had a lactate of 3.  He had a metabolic acidosis with anion gap.  Creatinine 1.4 proBNP elevated to 2200 troponin negative and a viral panel that was negative.  No anemia.  No leukocytosis. ? ? ? ?  ? ?Home Medications ?Prior to Admission medications   ?Medication Sig Start Date End Date Taking? Authorizing Provider  ?acetaminophen (TYLENOL) 500 MG tablet Take 1,000 mg by mouth 3 (three) times daily.   Yes [provider]  ?albuterol (VENTOLIN HFA) 108 (90 Base) MCG/ACT inhaler Inhale 2 puffs into the lungs every 6 (six) hours as needed for wheezing or shortness of breath. 10/30/20  Yes Merlyn Lot, MD  ?ammonium lactate (LAC-HYDRIN) 12 % lotion Apply 1 application. topically in the morning and at bedtime. Apply to toenails for nail fungus  01/23/22 Yes [provider]  ?aspirin EC 81 MG EC tablet Take 1 tablet (81 mg total) by mouth daily. Swallow whole. 02/01/21  Yes Samella Parr, NP  ?atorvastatin (LIPITOR) 80 MG tablet Take 1 tablet (80 mg total) by mouth daily. ?Patient taking differently: Take 80 mg by mouth every evening. 02/01/21  Yes Samella Parr, NP  ?baclofen (LIORESAL) 10 MG tablet Take 10 mg by mouth 3 (three) times daily.   Yes [provider]  ?bismuth subsalicylate (PEPTO BISMOL) 262 MG/15ML suspension Take 30 mLs by mouth every 4 (four)  hours as needed for diarrhea or loose stools.   Yes [provider]  ?cephALEXin (KEFLEX) 500 MG capsule Take 500 mg by mouth 4 (four) times daily. 12/18/21  Yes [provider]  ?Dextrose, Diabetic Use, (INSTA-GLUCOSE) 77.4 % GEL Take 1 Dose by mouth once as needed (for BG less than 70).   Yes [provider]  ?empagliflozin (JARDIANCE) 10 MG TABS tablet Take 10 mg by mouth daily.   Yes [provider]  ?fluticasone (FLONASE) 50 MCG/ACT nasal spray Place 2 sprays into both nostrils daily as needed for allergies. 10/14/20  Yes [provider]  ?furosemide (LASIX) 20 MG tablet Take 20 mg by mouth daily.   Yes [provider]  ?glipiZIDE (GLUCOTROL) 10 MG tablet Take 10 mg by mouth daily.   Yes [provider]  ?insulin aspart (NOVOLOG) 100 UNIT/ML injection Inject 0-15 Units into the skin 3 (three) times daily with meals. Do NOT hold if patient is NPO. ?Moderate Scale. ?Patient taking differently: Inject 3-15 Units into the skin in the morning, at noon, in the evening, and at bedtime. Moderate Scale.  151-200, 3 units; 201-250, 6 units; 251-300, 9 units; 301-350, 12 units, 351-400, 15 units; >400, give 15 units, and recheck in 1 hour. If still >400, Notify MD. 02/01/21  Yes Samella Parr, NP  ?Insulin Aspart FlexPen (NOVOLOG) 100 UNIT/ML Inject 2 Units into the skin  3 (three) times daily.   Yes [provider]  ?insulin glargine (LANTUS SOLOSTAR) 100 UNIT/ML Solostar Pen Inject 17 Units into the skin at bedtime.   Yes [provider]  ?loperamide (IMODIUM) 2 MG capsule Take 2 mg by mouth every 8 (eight) hours as needed for diarrhea or loose stools.   Yes [provider]  ?losartan (COZAAR) 25 MG tablet Take 0.5 tablets (12.5 mg total) by mouth at bedtime. ?Patient taking differently: Take 12.5 mg by mouth daily. 02/13/21  Yes Katherine Roan, MD  ?melatonin 3 MG TABS tablet Take 1 tablet (3 mg total) by mouth at bedtime.  02/01/21  Yes Samella Parr, NP  ?Menthol, Topical Analgesic, (BIOFREEZE) 4 % GEL Apply 1 application topically every 8 (eight) hours as needed (pain).   Yes [provider]  ?meropenem (MERREM) IVPB Inject 500 mg into the vein every 8 (eight) hours. 12/19/21  Yes Kipp Brood, MD  ?nitroGLYCERIN (NITROSTAT) 0.4 MG SL tablet Place 1 tablet (0.4 mg total) under the tongue every 5 (five) minutes x 3 doses as needed for chest pain. 02/01/21  Yes Samella Parr, NP  ?oxyCODONE (OXY IR/ROXICODONE) 5 MG immediate release tablet Take 1 tablet (5 mg total) by mouth every 4 (four) hours as needed (pain). ?Patient taking differently: Take 5 mg by mouth every 4 (four) hours as needed for severe pain (pain). 05/02/21  Yes Judith Part, MD  ?polyethylene glycol (MIRALAX / GLYCOLAX) 17 g packet Take 17 g by mouth daily as needed for moderate constipation or severe constipation. 02/01/21  Yes Samella Parr, NP  ?potassium chloride (MICRO-K) 10 MEQ CR capsule Take 2 capsules (20 mEq total) by mouth daily. 11/01/21  Yes Furth, Cadence H, PA-C  ?pregabalin (LYRICA) 150 MG capsule Take 150 mg by mouth 3 (three) times daily.   Yes [provider]  ?senna-docusate (SENOKOT-S) 8.6-50 MG tablet Take 2 tablets by mouth at bedtime as needed for mild constipation. 02/01/21  Yes Samella Parr, NP  ?tamsulosin (FLOMAX) 0.4 MG CAPS capsule Take 0.4 mg by mouth at bedtime.   Yes [provider]  ?ticagrelor (BRILINTA) 90 MG TABS tablet Take 1 tablet (90 mg total) by mouth 2 (two) times daily. 02/01/21  Yes Samella Parr, NP  ?traZODone (DESYREL) 50 MG tablet Take 50 mg by mouth at bedtime.   Yes [provider]  ?vancomycin IVPB Inject 1,000 mg into the vein every 8 (eight) hours. 12/19/21  Yes Kipp Brood, MD  ?acetaminophen (TYLENOL) 650 MG suppository Place 1 suppository (650 mg total) rectally every 6 (six) hours as needed for fever or mild pain. 12/19/21   Kipp Brood, MD  ?albuterol  (PROVENTIL) (2.5 MG/3ML) 0.083% nebulizer solution Take 3 mLs (2.5 mg total) by nebulization every 4 (four) hours as needed for wheezing or shortness of breath. 12/19/21   Kipp Brood, MD  ?insulin aspart (NOVOLOG) 100 UNIT/ML injection Inject 0-9 Units into the skin every 4 (four) hours. 12/20/21   Kipp Brood, MD  ?meropenem 1 g in sodium chloride 0.9 % 100 mL Inject 1 g into the vein every 8 (eight) hours. 12/19/21   Kipp Brood, MD  ?norepinephrine (LEVOPHED) 4-5 MG/250ML-% SOLN Inject 2-10 mcg/min into the vein continuous. 12/19/21   Kipp Brood, MD  ?pantoprazole (PROTONIX) 40 MG injection Inject 40 mg into the vein at bedtime. 12/19/21   Kipp Brood, MD  ?sodium chloride 0.9 % infusion Inject 250 mLs into the vein continuous. 12/19/21  Kipp Brood, MD  ?vancomycin (VANCOCIN) 1-5 GM/200ML-% SOLN Inject 200 mLs (1,000 mg total) into the vein daily. 12/20/21   Kipp Brood, MD  ?   ? ?Allergies    ?Patient has no known allergies.   ? ?Review of Systems   ?Review of Systems ? ?Physical Exam ?Updated Vital Signs ?BP (!) 100/55   Pulse 94   Temp 98.6 ?F (37 ?C)   Resp 11   Ht 6' 2"  (1.88 m)   Wt 102.5 kg   SpO2 94%   BMI 29.02 kg/m?  ?Physical Exam ?Vitals and nursing note reviewed.  ?Constitutional:   ?   Appearance: He is well-developed.  ?   Comments: Chronically ill appearing  ?HENT:  ?   Head: Normocephalic and atraumatic.  ?Eyes:  ?   Pupils: Pupils are equal, round, and reactive to light.  ?Neck:  ?   Vascular: No JVD.  ?Cardiovascular:  ?   Rate and Rhythm: Normal rate and regular rhythm.  ?   Heart sounds: No murmur heard. ?  No friction rub. No gallop.  ?Pulmonary:  ?   Effort: No respiratory distress.  ?   Breath sounds: No wheezing.  ?Abdominal:  ?   General: There is no distension.  ?   Tenderness: There is no abdominal tenderness. There is no guarding or rebound.  ?Musculoskeletal:     ?   General: Normal range of motion.  ?   Cervical back: Normal range of motion and neck supple.   ?Skin: ?   Coloration: Skin is not pale.  ?   Findings: No rash.  ?Neurological:  ?   Mental Status: He is alert and oriented to person, place, and time.  ?Psychiatric:     ?   Behavior: Behavior normal.  ? ? ?ED Results /

## 2021-12-20 HISTORY — PX: OTHER SURGICAL HISTORY: SHX169

## 2022-01-01 ENCOUNTER — Ambulatory Visit: Payer: Medicaid Other | Admitting: Medical

## 2022-03-21 ENCOUNTER — Ambulatory Visit: Payer: Medicaid Other | Admitting: Medical

## 2022-04-04 ENCOUNTER — Telehealth: Payer: Self-pay | Admitting: *Deleted

## 2022-04-04 NOTE — Telephone Encounter (Signed)
   Pre-operative Risk Assessment    Patient Name: [name].  DOB: 08/06/1971 MRN: [id number]      Request for Surgical Clearance    Procedure:   C2-T2 POSTERIOR FUSION   Date of Surgery:  Clearance 05/16/22                                 Surgeon:  DR. Rayford Halsted Surgeon's Group or Practice Name:  [facility] Phone number:  [phone] Fax number:  [phone]   Type of Clearance Requested:   - Pharmacy:  Hold [facility] 7-10 [place] 5 DAYS PRIOR      Type of Anesthesia:  General    Additional requests/questions:    Astrid Divine   04/04/2022, 9:57 AM

## 2022-04-04 NOTE — Telephone Encounter (Signed)
1st attempt to reach pt regarding surgical clearance and the need for a tele visit.  Left a message for pt to call back and ask for the preop team.

## 2022-04-04 NOTE — Telephone Encounter (Signed)
   Name: [name].  DOB: [birth year]  MRN: [id number]  Primary Cardiologist: Larae Grooms, MD   Preoperative team, please contact this patient and set up a phone call appointment for further preoperative risk assessment. Please obtain consent and complete medication review. Thank you for your help.  I confirm that guidance regarding antiplatelet and oral anticoagulation therapy has been completed and, if necessary, noted below.  Per office protocol, if patient is without any new symptoms or concerns at the time of their virtual visit, he/she may hold Brilinta for 5 days prior to surgery. Regarding ASA therapy, we recommend continuation of ASA throughout the perioperative period.  However, if the surgeon feels that cessation of ASA is required in the perioperative period, it may be stopped 5-7 days prior to surgery with a plan to resume it as soon as felt to be feasible from a surgical standpoint in the post-operative period. Please resume Brilinta as soon as possible postprocedure, at the discretion of the surgeon.   Lenna Sciara, NP 04/04/2022, 11:29 AM [place]

## 2022-04-05 NOTE — Telephone Encounter (Signed)
Patient has appointment to see Cadence Furth PA-C on 04/24/22. Will add "preop clearance" to appt notes and route to provider.

## 2022-04-08 ENCOUNTER — Encounter
Payer: Medicaid Other | Attending: Physical Medicine and Rehabilitation | Admitting: Physical Medicine and Rehabilitation

## 2022-04-08 ENCOUNTER — Encounter: Payer: Self-pay | Admitting: Physical Medicine and Rehabilitation

## 2022-04-08 VITALS — Ht 74.0 in

## 2022-04-08 DIAGNOSIS — G5603 Carpal tunnel syndrome, bilateral upper limbs: Secondary | ICD-10-CM | POA: Insufficient documentation

## 2022-04-08 DIAGNOSIS — G959 Disease of spinal cord, unspecified: Secondary | ICD-10-CM | POA: Insufficient documentation

## 2022-04-08 DIAGNOSIS — R252 Cramp and spasm: Secondary | ICD-10-CM | POA: Diagnosis present

## 2022-04-08 DIAGNOSIS — G5623 Lesion of ulnar nerve, bilateral upper limbs: Secondary | ICD-10-CM | POA: Insufficient documentation

## 2022-04-08 DIAGNOSIS — G8254 Quadriplegia, C5-C7 incomplete: Secondary | ICD-10-CM | POA: Diagnosis present

## 2022-04-08 NOTE — Progress Notes (Signed)
Subjective:    Patient ID: Frank Dolin., male    DOB: 10-25-1970, 51 y.o.   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  Following with ID. Also has carpal tunnel syndrome and cubital tunnel syndrome B/L - hasn't had surgery yet for wrists or elbows.    Here for evaluation of SCI.   Weakness in Arms and legs arm > legs.    Also has muscle tightness/muscle spasms-    Has had f/u with ID- 8/1 was cancelled due to doctor being sick.  Doesn't know when rescheduled to.  Plans ofr surgery by [facility] at [facility].   Main issues:  Fingers numb and tingling all the time- very weak-   Grip strength on R is 0 lbs on R and 3 lbs on Left-   Not in therapy- had therapy after neck surgery. Put on hold until after wrist/elbow surgeries.   Has gotten Oxycodone 10 mg-  takes ~ 2x/day.  Some nurses won't give Oxycodone when gets other meds- Lyrica, tylenol, trazodone- some nurses will.   Used to be On Baclofen, but says he think it was stopped when went to Lyrica On Lyrica 150 mg TID   Has PEG, but doesn't use anymore- gets hung up on everything .  Eating chopped diet- thin liquids.  Doesn't eat the tray they give him- he orders out, so eats whatever he wants.   Still in cervical collar- aspen.   Doesn't hurt except in shoulders, elbows and wrists- Doesn't hurt in neck.  Was told his neck is unstable- so was told needs hardware back.  Is supposed to wear another collar/vest- held his head with no movement- won't wear since rubbed place on cheek   Has control of stools and voiding- no issues.   Social Hx: Is in nursing facility.  Been in since January 31 2021.  Heart attack caused him to rupture disc in neck- Is not allowed to walk at nursing  home even though "he can".    Pain Inventory Average Pain 8 Pain Right Now 8 My pain is constant and aching  In the last 24 hours, has pain interfered with the following? General activity 10 Relation with others 0 Enjoyment of life 0 What TIME of day is your pain at its worst? morning , daytime, evening, and night Sleep (in general) Poor  Pain is worse with:  raising arm Pain improves with:  nothing helps pain Relief from Meds:  medication not helping  use a walker use a wheelchair  disabled: date disabled . I need assistance with the following:  dressing, bathing, toileting, meal prep, household duties, and shopping  numbness tingling trouble walking  Any changes since last visit?  no  Any changes since last visit?  no    Family History  Problem Relation Age of Onset   Breast cancer Mother    Heart attack Father    Diabetes Father    Heart disease Father    Lung cancer Paternal Grandmother    Social History   Socioeconomic History   Marital status: Single    Spouse name: Not on file   Number of children: Not on file   Years of education: Not on file  Highest education level: Not on file  Occupational History   Not on file  Tobacco Use   Smoking status: Every Day    Packs/day: 1.00    Years: 33.00    Total pack years: 33.00    Types: Cigarettes   Smokeless tobacco: Never  Vaping Use   Vaping Use: Never used  Substance and Sexual Activity   Alcohol use: Not Currently    Alcohol/week: 0.0 - 1.0 standard drinks of alcohol   Drug use: No   Sexual activity: Not on file  Other Topics Concern   Not on file  Social History Narrative   Not on file   Social Determinants of Health   Financial Resource Strain: Not on file  Food Insecurity: Not on file  Transportation Needs: Not on file  Physical Activity: Not on file  Stress: Not on file  Social Connections: Not on file   Past Surgical History:  Procedure Laterality Date   AMPUTATION Right  10/01/2020   Procedure: AMPUTATION 5th  RAY AND IRRIGATION AND DEBRIDEMENT;  Surgeon: Samara Deist, DPM;  Location: [facility];  Service: Podiatry;  Laterality: Right;   AMPUTATION Right 10/11/2020   Procedure: AMPUTATION RAY;  Surgeon: Caroline More, DPM;  Location: [facility];  Service: Podiatry;  Laterality: Right;   ANTERIOR CERVICAL DECOMPRESSION/DISCECTOMY FUSION 4 LEVELS N/A 04/25/2021   Procedure: Cervical Four-Five, Cervical Five-Six, Cervical Six-Seven, Cervical Seven- Thoracic One  Anterior cervical decompression/Discectomy/fusion;  Surgeon: Judith Part, MD;  Location: [facility];  Service: Neurosurgery;  Laterality: N/A;  Cervical Four-Five, Cervical Five-Six, Cervical Six-Seven, Cervical Seven- Thoracic One  Anterior cervical decompression/Discectomy/fusion   BACK SURGERY     CARDIAC CATHETERIZATION     CORONARY THROMBECTOMY N/A 01/19/2021   Procedure: Coronary Thrombectomy;  Surgeon: Jettie Booze, MD;  Location: [facility];  Service: Cardiovascular;  Laterality: N/A;   CORONARY/GRAFT ACUTE MI REVASCULARIZATION N/A 01/19/2021   Procedure: Coronary/Graft Acute MI Revascularization;  Surgeon: Jettie Booze, MD;  Location: [facility];  Service: Cardiovascular;  Laterality: N/A;   IABP INSERTION N/A 01/19/2021   Procedure: IABP Insertion;  Surgeon: Jettie Booze, MD;  Location: [facility];  Service: Cardiovascular;  Laterality: N/A;   INCISION AND DRAINAGE Right 09/29/2020   Procedure: INCISION AND DRAINAGE, RIGHT FOOT;  Surgeon: Samara Deist, DPM;  Location: [facility];  Service: Podiatry;  Laterality: Right;   LEFT HEART CATH AND CORONARY ANGIOGRAPHY N/A 01/19/2021   Procedure: LEFT HEART CATH AND CORONARY ANGIOGRAPHY;  Surgeon: Jettie Booze, MD;  Location: [facility];  Service: Cardiovascular;  Laterality: N/A;   LOWER EXTREMITY ANGIOGRAPHY Right 10/03/2020   Procedure: Lower Extremity Angiography;  Surgeon: Katha Cabal, MD;  Location: [facility];  Service: Cardiovascular;  Laterality: Right;   TENDON LENGTHENING Right 10/11/2020   Procedure: TENDON LENGTHENING;  Surgeon: Caroline More, DPM;  Location: [facility];  Service: Podiatry;  Laterality: Right;   Past Medical History:  Diagnosis Date   CHF (congestive heart failure) ([place])    Coronary artery disease    Diabetes mellitus without complication ([place])    Ischemic cardiomyopathy    Myocardial infarction [facility])    inferior STEMI ~ 2016; 01/19/21 with CHB/cardiogenic shock requiring brief TVP, IABP, s/p aspiration thrombectomy/ballon angioplasty for late thrombosis mRCA stent & DES for acute occlusion mLCX   Ht 6' 2"  (1.88 m)   BMI 29.02 kg/m   Opioid Risk Score:   Fall Risk Score:  `1  Depression screen [facility] 2/9     04/08/2022   11:33 AM 12/05/2015    2:26 PM 04/13/2015    1:43 PM  Depression screen PHQ 2/9  Decreased Interest 0 0 0  Down, Depressed, Hopeless 0 0 0  PHQ - 2 Score 0 0 0  Altered sleeping 3    Tired, decreased energy 3    Change in appetite 0    Feeling bad or failure about yourself  0    Trouble concentrating 0    Moving slowly or fidgety/restless 0    Suicidal thoughts 0    PHQ-9 Score 6       Review of Systems  Constitutional: Negative.   HENT: Negative.    Eyes: Negative.   Respiratory: Negative.    Cardiovascular: Negative.   Gastrointestinal: Negative.   Endocrine: Negative.   Genitourinary: Negative.   Musculoskeletal:  Positive for gait problem.  Skin: Negative.   Allergic/Immunologic: Negative.   Neurological:  Positive for numbness.  Hematological: Negative.   Psychiatric/Behavioral:  Positive for sleep disturbance.       Objective:   Physical Exam Awake, alert, appropriate, in aspen collar; has PEG tube in abdomen; in transport w/c; NAD Has beard  MS: RUE-  Biceps 4-/5; Triceps- 2-/5; WE 3-/5; grip- 4-/5; FA 2/5 LUE-   biceps 4-/5; Triceps 4-/5; WE- 4+/5; grip- 4-/5; FA 3+/5 RLE-  s/p TMA- is healed HF 4+/5; KE 4+/5; DF 4/5; PF 4-/5 LLE- HF 4+/5; KE 4+/5; DF and PF 4+/5  Some swelling of R>L hands  Neuro: Hoffman's (+) RUE- (-) on LUE Maybe MAS of 1 in RUE_ but not increase din LUE MAS of 2 in LE's at hips and knees- MAS 1+ to 2 in ankles No clonus felt, but having a few spasms overall with ROM.   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  Following with ID. Also has carpal tunnel syndrome and cubital tunnel syndrome B/L - hasn't had surgery yet for wrists or elbows.   Here for evaluation of SCI.    Explained puts self at risk for another more complete SCI if doesn't wear original cervical collar- but he understands that.  2. Suggest nursing home restart  (first) Baclofen 5 mg TID x 1 week, then increase to 10 mg TID for spasticity- it works very differently than Lyrica/other muscle meds, so Baclofen is needed, not Robaxin.   3. Con't Oxycodone as needed for breakthrough pain.  Explained to the patient- Only takes the edge off- because pain is nerve pain.   4. Suggest starting a medicine called Duloxetine- start 3 days after Baclofen restarted   Duloxetine /Cymbalta 30 mg nightly x 1 week  Then 60 mg nightly- for nerve pain  1% of patients can have nausea with Duloxetine- call me if needs an anti-nausea medicine. Can also cause mild dry mouth/dry eyes and mild constipation.   5. Suggest having Neurosurgery assess for carpal tunnel syndrome and cubital tunnel syndrome surgeries as wel as neck hardware.   6. Strongly Suggest working on his hand function/UE function (he needs OT and  likely PT for  mobility so can walk around in room)   Main issue is nerve pain and weakness. Can do hot/cold treatments as well as assistive devices/adaptive equipment And ROM as well as putty/etc.    7. F/U in 70month -double appt- SCI   I spent a total of 47   minutes on total care today- >50% coordination of care- due to discussion about spasticity- and nerve pain

## 2022-04-08 NOTE — Patient Instructions (Signed)
Pt is a 51 yr old male with hx of DM (A1c 8.5 in 5/23), HTN, STEMI s/p PCI with stent placement-- on Eliquis-  EF 40-45% ; S/P C4-T1 surgery for cervical myelopthy 9/22 s/p prevertebral abscess with C4-T1 s/p hardware removal and washouts multiple times in 5/23.  Following with ID. Also has carpal tunnel syndrome and cubital tunnel syndrome B/L - hasn't had surgery yet for wrists or elbows.   Here for evaluation of SCI.    Explained puts self at risk for another more complete SCI if doesn't wear original cervical collar- but he understands that.  2. Suggest nursing home restart  (first) Baclofen 5 mg TID x 1 week, then increase to 10 mg TID for spasticity- it works very differently than Lyrica/other muscle meds, so Baclofen is needed, not Robaxin.   3. Con't Oxycodone as needed for breakthrough pain.  Explained to the patient- Only takes the edge off- because pain is nerve pain.   4. Suggest starting a medicine called Duloxetine- start 3 days after Baclofen restarted   Duloxetine /Cymbalta 30 mg nightly x 1 week  Then 60 mg nightly- for nerve pain  1% of patients can have nausea with Duloxetine- call me if needs an anti-nausea medicine. Can also cause mild dry mouth/dry eyes and mild constipation.   5. Suggest having Neurosurgery assess for carpal tunnel syndrome and cubital tunnel syndrome surgeries as wel as neck hardware.   6. Strongly Suggest working on his hand function/UE function (he needs OT and likely PT for mobility so can walk around in room)   Main issue is nerve pain and weakness. Can do hot/cold treatments as well as assistive devices/adaptive equipment And ROM as well as putty/etc.    7. F/U in 66month -double appt- SCI

## 2022-04-24 ENCOUNTER — Ambulatory Visit: Payer: Medicaid Other | Attending: Medical | Admitting: Medical

## 2022-04-24 ENCOUNTER — Encounter: Payer: Self-pay | Admitting: Medical

## 2022-04-24 VITALS — BP 108/68 | HR 72 | Ht 74.0 in | Wt 229.0 lb

## 2022-04-24 DIAGNOSIS — I10 Essential (primary) hypertension: Secondary | ICD-10-CM | POA: Insufficient documentation

## 2022-04-24 DIAGNOSIS — I5022 Chronic systolic (congestive) heart failure: Secondary | ICD-10-CM | POA: Diagnosis present

## 2022-04-24 DIAGNOSIS — Z01810 Encounter for preprocedural cardiovascular examination: Secondary | ICD-10-CM | POA: Diagnosis present

## 2022-04-24 DIAGNOSIS — I2510 Atherosclerotic heart disease of native coronary artery without angina pectoris: Secondary | ICD-10-CM | POA: Diagnosis present

## 2022-04-24 MED ORDER — SPIRONOLACTONE 25 MG PO TABS: 12.5000 mg | ORAL_TABLET | Freq: Every day | ORAL | 3 refills | Status: DC

## 2022-04-24 NOTE — Patient Instructions (Signed)
Medication Instructions:  Your physician has recommended you make the following change in your medication:   RESTART Spironolactone 12.5 mg daily. An Rx has been sent to your pharmacy.   *If you need a refill on your cardiac medications before your next appointment, please call your pharmacy*   Lab Work: Your physician recommends that you return for lab work (bmp) in: 1 week  Please have your lab drawn at the [facility]. No appt needed. Lab hours are Mon-Fri 7am-6pm.   If you have labs (blood work) drawn today and your tests are completely normal, you will receive your results only by: [facility] (if you have MyChart) OR A paper copy in the mail If you have any lab test that is abnormal or we need to change your treatment, we will call you to review the results.   Testing/Procedures: None ordered   Follow-Up: At [facility], you and your health needs are our priority.  As part of our continuing mission to provide you with exceptional heart care, we have created designated Provider Care Teams.  These Care Teams include your primary Cardiologist (physician) and Advanced Practice Providers (APPs -  Physician Assistants and Nurse Practitioners) who all work together to provide you with the care you need, when you need it.  We recommend signing up for the patient portal called "MyChart".  Sign up information is provided on this After Visit Summary.  MyChart is used to connect with patients for Virtual Visits (Telemedicine).  Patients are able to view lab/test results, encounter notes, upcoming appointments, etc.  Non-urgent messages can be sent to your provider as well.   To learn more about what you can do with MyChart, go to [email or web address].    Your next appointment:   3 month(s)  The format for your next appointment:   In Person  Provider:   You may see Nelva Bush, MD or one of the following Advanced Practice Providers on your designated Care  Team:   Murray Hodgkins, NP Christell Faith, PA-C Cadence Kathlen Mody, PA-C Gerrie Nordmann, NP   Other Instructions N/A  Important Information About Sugar

## 2022-04-24 NOTE — Progress Notes (Addendum)
Cardiology Office Note:    Date:  05/07/2022   ID:  Frank Dolin., DOB [birth year], MRN [id number]  PCP:  Frank Bott, MD  [facility] Cardiologist:  Frank Grooms, MD  [place] Electrophysiologist:  None   Referring MD: Frank Spikes, DO   Chief Complaint: Pre-op visit  History of Present Illness:    Frank Carlini. is a 51 y.o. male with a hx of CAD s/p multiple PCIs with STEMI in 01/2021 due ti simultaneous late stent thrombosis in the mid RCA as well as occlusion of the Lcx complicated by cardiogenic shock, HFrEF due to ischemic cardiomyopathy, right lower extremity amputation, HTN, HLD, diabetes mellitis, and cervical myelopathy who presents for 1 month follow-up.    Admitted 01/2021 for STEMI. He presented to [facility] after a syncopal episode.  When he was found by EMS, EKG showed ST elevation.  He underwent aspiration thrombectomy and PTCA of the mid RCA as well as stenting of the mid LCx.  He had transient complete heart block in the setting of his MI, which resolved with revascularization.  LVEF was 40% by echo.  He also has transient atrial fibrillation with rapid ventricular response during the PCI.  He was noted to have bilateral hand weakness during his hospitalization with concern for worsening cervical myelopathy.   Patient underwent elective surgery (for cervical myelopathy) in 04/2021 and Brilinta was held for 5 days. Aspirin was continued through the peri-operative period. There were no subsequent complications.    Last seen 11/01/21 and was overall doing well from a cardiac standpoint. Spironolactone was added.   Last seen 12/17/21 for pre-op cardiac evaluation for carpal tunnel surgery.   The patient had cervical spine fusion 04/2021. He was subsequently admitted to a SNF He presented to OSH 12/2021 with 3 day history of dysphagia and underwent CT chest and neck which was concerning for hardware migration and adjacent abscess. He was briefly on levophed and  transferred to [facility] for ENT , neurosurgery and thoracic surgery evaluation. Due to size of cervical abscess and supraglottic compression, the patient was taken for awake tracheostomy , washout abscess and removal of spinal hardware 12/20/21.   Today the patient will be having re-do neck surgery. They are planning on putting the hardware back in. The patient denies chest pain or SOB. He reports minimal LLE, no orthopnea or pnd. No palpitations or dizziness or lightheadedness. BP good today, was soft in the past. He is not on losartan or spironolactone. He is in a wheelchair. He can get up and walk, but they don't want to because of his neck. He is at a SNF.   Past Medical History:  Diagnosis Date   CHF (congestive heart failure) (HCC)    Coronary artery disease    Diabetes mellitus without complication ([place])    Ischemic cardiomyopathy    Myocardial infarction [facility])    inferior STEMI ~ 2016; 01/19/21 with CHB/cardiogenic shock requiring brief TVP, IABP, s/p aspiration thrombectomy/ballon angioplasty for late thrombosis mRCA stent & DES for acute occlusion mLCX    Past Surgical History:  Procedure Laterality Date   AMPUTATION Right 10/01/2020   Procedure: AMPUTATION 5th  RAY AND IRRIGATION AND DEBRIDEMENT;  Surgeon: Samara Deist, DPM;  Location: [facility] ORS;  Service: Podiatry;  Laterality: Right;   AMPUTATION Right 10/11/2020   Procedure: AMPUTATION RAY;  Surgeon: Caroline More, DPM;  Location: [facility] ORS;  Service: Podiatry;  Laterality: Right;   ANTERIOR CERVICAL DECOMPRESSION/DISCECTOMY FUSION 4 LEVELS N/A  04/25/2021   Procedure: Cervical Four-Five, Cervical Five-Six, Cervical Six-Seven, Cervical Seven- Thoracic One  Anterior cervical decompression/Discectomy/fusion;  Surgeon: Judith Part, MD;  Location: [place];  Service: Neurosurgery;  Laterality: N/A;  Cervical Four-Five, Cervical Five-Six, Cervical Six-Seven, Cervical Seven- Thoracic One  Anterior cervical decompression/Discectomy/fusion    BACK SURGERY     CARDIAC CATHETERIZATION     CORONARY THROMBECTOMY N/A 01/19/2021   Procedure: Coronary Thrombectomy;  Surgeon: Jettie Booze, MD;  Location: [place] [facility];  Service: Cardiovascular;  Laterality: N/A;   CORONARY/GRAFT ACUTE MI REVASCULARIZATION N/A 01/19/2021   Procedure: Coronary/Graft Acute MI Revascularization;  Surgeon: Jettie Booze, MD;  Location: [place] [facility];  Service: Cardiovascular;  Laterality: N/A;   IABP INSERTION N/A 01/19/2021   Procedure: IABP Insertion;  Surgeon: Jettie Booze, MD;  Location: [place] [facility];  Service: Cardiovascular;  Laterality: N/A;   INCISION AND DRAINAGE Right 09/29/2020   Procedure: INCISION AND DRAINAGE, RIGHT FOOT;  Surgeon: Samara Deist, DPM;  Location: [facility] OR[facility];  Service: Podiatry;  Laterality: Right;   LEFT HEART CATH AND CORONARY ANGIOGRAPHY N/A 01/19/2021   Procedure: LEFT HEART CATH AND CORONARY ANGIOGRAPHY;  Surgeon: Jettie Booze, MD;  Location: [place] [facility];  Service: Cardiovascular;  Laterality: N/A;   LOWER EXTREMITY ANGIOGRAPHY Right 10/03/2020   Procedure: Lower Extremity Angiography;  Surgeon: Katha Cabal, MD;  Location: [place] [facility];  Service: Cardiovascular;  Laterality: Right;   TENDON LENGTHENING Right 10/11/2020   Procedure: TENDON LENGTHENING;  Surgeon: Caroline More, DPM;  Location: [facility];  Service: Podiatry;  Laterality: Right;    Current Medications: Current Meds  Medication Sig   acetaminophen (TYLENOL) 650 MG CR tablet Take by mouth as needed.   albuterol (PROVENTIL) (2.5 MG/3ML) 0.083% nebulizer solution Take 3 mLs (2.5 mg total) by nebulization every 4 (four) hours as needed for wheezing or shortness of breath.   ammonium lactate (LAC-HYDRIN) 12 % lotion Apply 1 Application topically as needed for dry skin.   aspirin 81 MG chewable tablet Chew by mouth daily.   atorvastatin (LIPITOR) 80 MG tablet Take 80 mg by mouth daily.    baclofen (LIORESAL) 10 MG tablet Take 10 mg by mouth in the morning, at noon, and at bedtime.   [EXPIRED] ciprofloxacin (CIPRO) 500 MG tablet Take by mouth daily.   docusate sodium (COLACE) 100 MG capsule Take 100 mg by mouth 2 (two) times daily.   [EXPIRED] doxycycline (VIBRA-TABS) 100 MG tablet Take 100 mg by mouth in the morning and at bedtime.   DULoxetine (CYMBALTA) 30 MG capsule Take 30 mg by mouth at bedtime.   empagliflozin (JARDIANCE) 10 MG TABS tablet Take 10 mg by mouth daily.   ferrous sulfate 325 (65 FE) MG tablet Take 325 mg by mouth daily with breakfast.   fluticasone (FLONASE) 50 MCG/ACT nasal spray Place 1 spray into both nostrils daily.   furosemide (LASIX) 20 MG tablet Take 20 mg by mouth.   glipiZIDE (GLUCOTROL) 10 MG tablet Take 10 mg by mouth daily before breakfast.   insulin aspart (NOVOLOG) 100 UNIT/ML injection Inject 0-9 Units into the skin every 4 (four) hours. (Patient taking differently: Inject 0-9 Units into the skin every 4 (four) hours. Sliding scale)   insulin glargine (LANTUS) 100 UNIT/ML injection Inject 5 Units into the skin at bedtime.   loperamide (IMODIUM) 2 MG capsule Take 2 mg by mouth as needed for diarrhea or loose stools.   melatonin 3 MG TABS  tablet Take 3 mg by mouth at bedtime.   Menthol, Topical Analgesic, (BIOFREEZE) 4 % GEL Apply topically every 8 (eight) hours as needed.   metFORMIN (GLUCOPHAGE-XR) 500 MG 24 hr tablet Take 4 tablets by mouth daily with breakfast.   nitroGLYCERIN (NITROSTAT) 0.4 MG SL tablet Place 0.4 mg under the tongue every 5 (five) minutes as needed for chest pain.   Oxycodone HCl 10 MG TABS Take 1 tablet by mouth every 4 (four) hours as needed.   polyethylene glycol (MIRALAX / GLYCOLAX) 17 g packet Take 17 g by mouth as needed.   POTASSIUM CHLORIDE PO Take 20 mEq by mouth daily.   pregabalin (LYRICA) 150 MG capsule Take 150 mg by mouth 3 (three) times daily.   spironolactone (ALDACTONE) 25 MG tablet Take 0.5 tablets (12.5 mg  total) by mouth daily.   tamsulosin (FLOMAX) 0.4 MG CAPS capsule Take 0.4 mg by mouth.   ticagrelor (BRILINTA) 90 MG TABS tablet Take 90 mg by mouth 2 (two) times daily.   traZODone (DESYREL) 50 MG tablet Take 50 mg by mouth at bedtime.     Allergies:   Patient has no known allergies.   Social History   Socioeconomic History   Marital status: Single    Spouse name: Not on file   Number of children: Not on file   Years of education: Not on file   Highest education level: Not on file  Occupational History   Not on file  Tobacco Use   Smoking status: Every Day    Packs/day: 1.00    Years: 33.00    Total pack years: 33.00    Types: Cigarettes   Smokeless tobacco: Never  Vaping Use   Vaping Use: Never used  Substance and Sexual Activity   Alcohol use: Not Currently    Alcohol/week: 0.0 - 1.0 standard drinks of alcohol   Drug use: No   Sexual activity: Not on file  Other Topics Concern   Not on file  Social History Narrative   Not on file   Social Determinants of Health   Financial Resource Strain: Not on file  Food Insecurity: Not on file  Transportation Needs: Not on file  Physical Activity: Not on file  Stress: Not on file  Social Connections: Not on file     Family History: The patient's family history includes Breast cancer in his mother; Diabetes in his father; Heart attack in his father; Heart disease in his father; Lung cancer in his paternal grandmother.  ROS:   Please see the history of present illness.     All other systems reviewed and are negative.  EKGs/Labs/Other Studies Reviewed:    The following studies were reviewed today:  Echo 05/2021  1. Left ventricular ejection fraction, by estimation, is 30 to 35%. The  left ventricle has moderately decreased function. The left ventricle  demonstrates regional wall motion abnormalities (see scoring  diagram/findings for description). There is mild  left ventricular hypertrophy. Left ventricular  diastolic function could  not be evaluated. There is severe hypokinesis of the left ventricular,  entire inferoseptal wall and inferior wall.   2. Right ventricular systolic function was not well visualized. The right  ventricular size is not well visualized. Tricuspid regurgitation signal is  inadequate for assessing PA pressure.   3. The mitral valve is normal in structure. No evidence of mitral valve  regurgitation. No evidence of mitral stenosis.   4. The aortic valve was not well visualized. Aortic valve regurgitation  is not visualized. No aortic stenosis is present.   5. The inferior vena cava is normal in size with greater than 50%  respiratory variability, suggesting right atrial pressure of 3 mmHg.   6. Challenging image quality.    Echo 01/20/21 1. Left ventricular ejection fraction, by estimation, is 40%. The left  ventricle has moderately decreased function. The left ventricle  demonstrates regional wall motion abnormalities (see scoring  diagram/findings for description). Left ventricular  diastolic parameters are indeterminate. There is severe hypokinesis of the  left ventricular, entire inferior wall and inferolateral wall.   2. Right ventricular systolic function is mildly reduced. The right  ventricular size is normal. Tricuspid regurgitation signal is inadequate  for assessing PA pressure.   3. The mitral valve is normal in structure. No evidence of mitral valve  regurgitation.   4. The aortic valve is tricuspid. Aortic valve regurgitation is not  visualized. Mild aortic valve sclerosis is present, with no evidence of  aortic valve stenosis.   5. The inferior vena cava is dilated in size with <50% respiratory  variability, suggesting right atrial pressure of 15 mmHg.    Cardiac cath 01/2021 Mid RCA lesion is 100% stenosed- very late stent thrombosis. Aspiration thrombectomy was performed which restored TIMI-3 flow. Balloon angioplasty was performed using a BALLOON Allen  [place]on [id number]. Post intervention, there is a 0% residual stenosis. Mid Cx lesion is 100% stenosed. A drug-eluting stent was successfully placed using a STENT RESOLUTE ONYX 2.5X15, postdilated to greater than 3 mm. Post intervention, there is a 0% residual stenosis. LV end diastolic pressure is mildly elevated. There is no aortic valve stenosis. Temporary pacemaker placement was used. Heart rate improved after PCI of the RCA. The temporary pacer was removed. Successful placement of the intra-aortic balloon pump.   Dual antiplatelet therapy for 12 months along with aggressive secondary prevention.  Very late stent thrombosis of the RCA likely due to his antiplatelet therapy being stopped back in February.  Second acute occlusion of the circumflex.     Transfer to [facility] given his intra-aortic balloon pump.  EKG:  EKG is ordered today.  The ekg ordered today demonstrates NSR 77bpm, iRBBB, nonspecific T wave changes  Recent Labs: 12/19/2021: ALT 21; B Natriuretic Peptide 233.4; BUN 40; Creatinine, Ser 1.69; Hemoglobin 12.7; Magnesium 1.9; Platelets 152; Potassium 4.4; Sodium 140  Recent Lipid Panel    Component Value Date/Time   CHOL 119 04/11/2021 0912   TRIG 131 04/11/2021 0912   HDL 34 (L) 04/11/2021 0912   CHOLHDL 3.5 04/11/2021 0912   CHOLHDL 4.8 01/20/2021 0320   VLDL 24 01/20/2021 0320   LDLCALC 62 04/11/2021 0912   LDLDIRECT 132.0 12/05/2015 1510   Physical Exam:    VS:  BP 108/68 (BP Location: Left Arm, Patient Position: Sitting, Cuff Size: Normal)   Pulse 72   Ht 6' 2"  (1.88 m)   Wt 229 lb (103.9 kg)   SpO2 98%   BMI 29.40 kg/m     Wt Readings from Last 3 Encounters:  04/24/22 229 lb (103.9 kg)  12/19/21 226 lb (102.5 kg)  12/17/21 222 lb (100.7 kg)     GEN:  Well nourished, well developed in no acute distress HEENT: Normal NECK: No JVD; No carotid bruits LYMPHATICS: No lymphadenopathy CARDIAC: RRR, no murmurs, rubs, gallops RESPIRATORY:  Clear to  auscultation without rales, wheezing or rhonchi  ABDOMEN: Soft, non-tender, non-distended MUSCULOSKELETAL:  No edema; No deformity  SKIN: Warm and dry NEUROLOGIC:  Alert and oriented x 3 PSYCHIATRIC:  Normal affect   ASSESSMENT:    1. Pre-operative cardiovascular examination   2. Chronic HFrEF (heart failure with reduced ejection fraction) ([place])   3. Essential hypertension   4. Coronary artery disease involving native coronary artery of native heart without angina pectoris    PLAN:    In order of problems listed above:  HFrEF LVEF 40-45% by echo 11/2021 He has minimal lower leg edema on exam. I will restart spironolactone 12.23m daily. BMET in a week. Continue Jardiance. Losartan previously held for low BP, restart Losartan when able.   HTN  BP good today. BP previously low and Losartan and spironolactone were held. Continue Jardiance. Restart spironolactone as above, and Losartan when able.   CAD s/p DES RCA 01/2021 Cath in 01/2021 recommended DAPT with ASA and Brilinta for at least 12 months. OK to hold Brilinta 5 days before procedure. Continue ASA in the perioperative period. Other pos-cath recommendations, past 12 months to continue with Plavix long-term- we can change this at follow-up. No further work-up indicated at this time.   Pre-op evaluation Planning on re-do neck surgery for hardware migration and abscess. The Patient is overall stable from a cardiac perspective. He denies anginal symptoms. He is not functional at baseline at this moment due to neck issues, however he is still METS>4. Most recent echo 11/2021 showed LVEF 40-45%. Restart spironolactone as above for minimal lower leg edema. Cath in 01/2021 recommended DAPT for at least 12 months. Ok to hold Brilinta 5 days prior to the surgery. Can continue ASA in the perioperative period. EKG today with no ischemic changes. According to the RCRI he is Class IV risk, 15% 30 day risk of death, MI or cardiac arrest. OK to proceed  with surgery with no prior cardiac work-up. Monitor fluid status perioperatively.   Disposition: Follow up in 3 month(s) with MD/APP    Signed, Iyan Flett HNinfa Meeker PA-C  05/07/2022 11:09 AM    [place]

## 2022-05-16 HISTORY — PX: OTHER SURGICAL HISTORY: SHX169

## 2022-05-18 ENCOUNTER — Encounter (HOSPITAL_COMMUNITY): Payer: Self-pay

## 2022-07-17 ENCOUNTER — Encounter: Payer: Medicaid Other | Admitting: Physical Medicine and Rehabilitation

## 2022-07-24 ENCOUNTER — Ambulatory Visit: Payer: Medicaid Other | Admitting: Medical

## 2022-07-25 NOTE — Progress Notes (Signed)
    Cardiology Clinic Note   Patient Name: Frank L Fillinger [name] Date of Encounter: 07/26/2022  Primary Care Provider:  Hoffman, Byron, MD Primary Cardiologist:  Jayadeep Varanasi, MD  Patient Profile    51-year-old male with a history of CAD status post multiple PCI's with STEMI in 01/2021 due to simultaneous late stent thrombosis to the mid RCA as well as occlusion of the left circumflex complicated by cardiogenic shock, HFrEF, due to ischemic cardiomyopathy, right lower extremity amputation, hypertension, hyperlipidemia, diabetes, and cervical myelopathy, who presents for follow-up.  Past Medical History    Past Medical History:  Diagnosis Date   CHF (congestive heart failure) (HCC)    Coronary artery disease    Diabetes mellitus without complication (HCC)    Ischemic cardiomyopathy    Myocardial infarction (HCC)    inferior STEMI ~ 2016; 01/19/21 with CHB/cardiogenic shock requiring brief TVP, IABP, s/p aspiration thrombectomy/ballon angioplasty for late thrombosis mRCA stent & DES for acute occlusion mLCX   Past Surgical History:  Procedure Laterality Date   AMPUTATION Right 10/01/2020   Procedure: AMPUTATION 5th  RAY AND IRRIGATION AND DEBRIDEMENT;  Surgeon: Fowler, Justin, DPM;  Location: [facility];  Service: Podiatry;  Laterality: Right;   AMPUTATION Right 10/11/2020   Procedure: AMPUTATION RAY;  Surgeon: Baker, Andrew, DPM;  Location: [facility];  Service: Podiatry;  Laterality: Right;   ANTERIOR CERVICAL DECOMPRESSION/DISCECTOMY FUSION 4 LEVELS N/A 04/25/2021   Procedure: Cervical Four-Five, Cervical Five-Six, Cervical Six-Seven, Cervical Seven- Thoracic One  Anterior cervical decompression/Discectomy/fusion;  Surgeon: Ostergard, Thomas A, MD;  Location: [facility];  Service: Neurosurgery;  Laterality: N/A;  Cervical Four-Five, Cervical Five-Six, Cervical Six-Seven, Cervical Seven- Thoracic One  Anterior cervical decompression/Discectomy/fusion   BACK SURGERY     CARDIAC  CATHETERIZATION     CORONARY THROMBECTOMY N/A 01/19/2021   Procedure: Coronary Thrombectomy;  Surgeon: Varanasi, Jayadeep S, MD;  Location: [facility];  Service: Cardiovascular;  Laterality: N/A;   CORONARY/GRAFT ACUTE MI REVASCULARIZATION N/A 01/19/2021   Procedure: Coronary/Graft Acute MI Revascularization;  Surgeon: Varanasi, Jayadeep S, MD;  Location: [facility];  Service: Cardiovascular;  Laterality: N/A;   IABP INSERTION N/A 01/19/2021   Procedure: IABP Insertion;  Surgeon: Varanasi, Jayadeep S, MD;  Location: [facility];  Service: Cardiovascular;  Laterality: N/A;   INCISION AND DRAINAGE Right 09/29/2020   Procedure: INCISION AND DRAINAGE, RIGHT FOOT;  Surgeon: Fowler, Justin, DPM;  Location: [facility];  Service: Podiatry;  Laterality: Right;   LEFT HEART CATH AND CORONARY ANGIOGRAPHY N/A 01/19/2021   Procedure: LEFT HEART CATH AND CORONARY ANGIOGRAPHY;  Surgeon: Varanasi, Jayadeep S, MD;  Location: [facility];  Service: Cardiovascular;  Laterality: N/A;   LOWER EXTREMITY ANGIOGRAPHY Right 10/03/2020   Procedure: Lower Extremity Angiography;  Surgeon: Schnier, Gregory G, MD;  Location: [facility];  Service: Cardiovascular;  Laterality: Right;   TENDON LENGTHENING Right 10/11/2020   Procedure: TENDON LENGTHENING;  Surgeon: Baker, Andrew, DPM;  Location: [facility];  Service: Podiatry;  Laterality: Right;    Allergies  No Known Allergies  History of Present Illness    Frank L. Meckes, [name] is a 51-year-old male with previously mentioned past medical history of CAD status post multiple PCI's with STEMI in 01/2021 due to simultaneous late stent thrombosis in the mid RCA as well as occlusion of the left circumflex complicated by cardiogenic shock, HFrEF due to ischemic cardiomyopathy, right lower extremity amputation, hypertension, hyperlipidemia, diabetes, cervical myelopathy.  He was   admitted 01/2021 for STEMI.  He presented to [facility] after  syncopal episode.  When he was found by EMS, EKG showed ST elevations.  He underwent aspiration thrombectomy and PTCA of the mid RCA and stenting of the mid left circumflex.  He had transient complete heart block in the setting of his myocardial infarction, which resolved with revascularization.  Echocardiogram revealed LVEF of 40%.  He also had transient atrial fibrillation with RVR during the PCI.  He had bilateral hand weakness during his hospitalization with concern for worsening cervical myelopathy.  He underwent cervical spine fusion 04/2021.  Subsequently was admitted to SNF and presented to OSH 12/2021 with 3-day history of dysphagia and underwent CT chest and neck which was concerning for hardware migration and adjacent abscess.  He was briefly on Levophed and transferred to [facility] for ENT, neurosurgery, and thoracic surgery evaluation.  Due to the size of the cervical abscess and supraglottic compression, he was taken for awake tracheostomy, washout abscess and removal of spinal hardware 12/20/2021.  He was last seen in clinic 04/24/2022 for preoperative clearance for redo of his neck surgery.  They were planning on putting a hardware back in.  He had currently denied any chest pain or shortness of breath.  He was subsequently cleared for surgery.  With follow-up scheduled in 3 months post.  He was admitted to [facility] on 05/16/2022 for cervical neuropathy and a C3-T2 PCF with a C6/7 laminectomy.  He tolerated the procedure well was extubated in the OR and was taken to PACU for further neuromonitoring and was eventually transferred to the floor.  Overall he did well postoperatively.  He was discharged on postop day 4 in stable condition aspirin was restarted on discharge but Brilinta was held for 2 weeks to be further discussed in spine clinic.  He was discharged back to SNF on postop day 4.  He returns to clinic today stating that overall he has been doing well since her cervical surgery.  He was just  recently started walking with physical therapy and has not noted some increased dyspnea on exertion just with ambulation.  He does not have the same dyspnea on sedentary activities with therapy.  However review of his chart his discharge hemoglobin was 7.7.  He denies any current chest pain, palpitations, lightheadedness, syncope/near syncope.  He has not had any recent hospitalizations or visits to the emergency department.  Home Medications    Current Outpatient Medications  Medication Sig Dispense Refill   acetaminophen (TYLENOL) 650 MG CR tablet Take by mouth as needed.     albuterol (PROVENTIL) (2.5 MG/3ML) 0.083% nebulizer solution Take 3 mLs (2.5 mg total) by nebulization every 4 (four) hours as needed for wheezing or shortness of breath. 75 mL 12   ammonium lactate (LAC-HYDRIN) 12 % lotion Apply 1 Application topically as needed for dry skin.     atorvastatin (LIPITOR) 80 MG tablet Take 80 mg by mouth daily.     baclofen (LIORESAL) 10 MG tablet Take 10 mg by mouth in the morning, at noon, and at bedtime.     bismuth subsalicylate (PEPTO BISMOL) 262 MG/15ML suspension Take 30 mLs by mouth every 4 (four) hours as needed.     Dextrose, Diabetic Use, (INSTA-GLUCOSE) 77.4 % GEL Take 31 g by mouth.  Take 31 g (24 g of dextrose total) by mouth every fifteen (15) minutes as needed for low blood sugar.     DULoxetine (CYMBALTA) 60 MG capsule Take 60 mg by mouth at   bedtime.     empagliflozin (JARDIANCE) 10 MG TABS tablet Take 10 mg by mouth daily.     ferrous sulfate 325 (65 FE) MG tablet Take 325 mg by mouth daily with breakfast.     fluticasone (FLONASE) 50 MCG/ACT nasal spray Place 1 spray into both nostrils daily.     Glucagon, rDNA, (GLUCAGON EMERGENCY) 1 MG KIT Inject into the muscle as needed.     insulin aspart (NOVOLOG) 100 UNIT/ML injection Inject 0-9 Units into the skin every 4 (four) hours. (Patient taking differently: Inject 0-9 Units into the skin every 4 (four) hours. Sliding scale) 10  mL 11   insulin glargine (LANTUS) 100 UNIT/ML injection Inject 6 Units into the skin at bedtime.     loperamide (IMODIUM) 2 MG capsule Take 2 mg by mouth as needed for diarrhea or loose stools.     losartan (COZAAR) 25 MG tablet Take 0.5 tablets by mouth daily.     melatonin 3 MG TABS tablet Take 3 mg by mouth at bedtime.     Menthol, Topical Analgesic, (BIOFREEZE) 4 % GEL Apply topically every 8 (eight) hours as needed.     metFORMIN (GLUCOPHAGE-XR) 500 MG 24 hr tablet Take 4 tablets by mouth daily with breakfast. 1000 mg daily     nitroGLYCERIN (NITROSTAT) 0.4 MG SL tablet Place 0.4 mg under the tongue every 5 (five) minutes as needed for chest pain.     Oxycodone HCl 10 MG TABS Take 1 tablet by mouth every 4 (four) hours as needed.     polyethylene glycol (MIRALAX / GLYCOLAX) 17 g packet Take 17 g by mouth as needed.     pregabalin (LYRICA) 150 MG capsule Take 150 mg by mouth 3 (three) times daily.     spironolactone (ALDACTONE) 25 MG tablet Take 0.5 tablets (12.5 mg total) by mouth daily. 15 tablet 3   tamsulosin (FLOMAX) 0.4 MG CAPS capsule Take 0.4 mg by mouth.     acetaminophen (TYLENOL) 650 MG suppository Place 1 suppository (650 mg total) rectally every 6 (six) hours as needed for fever or mild pain. (Patient not taking: Reported on 04/08/2022) 12 suppository 0   aspirin 81 MG chewable tablet Chew by mouth daily. (Patient not taking: Reported on 07/26/2022)     docusate sodium (COLACE) 100 MG capsule Take 100 mg by mouth 2 (two) times daily. (Patient not taking: Reported on 07/26/2022)     furosemide (LASIX) 20 MG tablet Take 20 mg by mouth. (Patient not taking: Reported on 07/26/2022)     glipiZIDE (GLUCOTROL) 10 MG tablet Take 10 mg by mouth daily before breakfast.     meropenem (MERREM) IVPB Inject 500 mg into the vein every 8 (eight) hours. (Patient not taking: Reported on 04/08/2022)     meropenem 1 g in sodium chloride 0.9 % 100 mL Inject 1 g into the vein every 8 (eight) hours. (Patient  not taking: Reported on 04/08/2022)     norepinephrine (LEVOPHED) 4-5 MG/250ML-% SOLN Inject 2-10 mcg/min into the vein continuous. (Patient not taking: Reported on 04/08/2022)     pantoprazole (PROTONIX) 40 MG injection Inject 40 mg into the vein at bedtime. (Patient not taking: Reported on 04/08/2022) 1 each    POTASSIUM CHLORIDE PO Take 20 mEq by mouth daily.     sodium chloride 0.9 % infusion Inject 250 mLs into the vein continuous. (Patient not taking: Reported on 04/08/2022)  0   ticagrelor (BRILINTA) 90 MG TABS tablet Take 90 mg by mouth 2 (  two) times daily.     traZODone (DESYREL) 50 MG tablet Take 50 mg by mouth at bedtime.     vancomycin (VANCOCIN) 1-5 GM/200ML-% SOLN Inject 200 mLs (1,000 mg total) into the vein daily. (Patient not taking: Reported on 04/08/2022) 4000 mL    vancomycin IVPB Inject 1,000 mg into the vein every 8 (eight) hours. (Patient not taking: Reported on 04/08/2022)     No current facility-administered medications for this visit.     Family History    Family History  Problem Relation Age of Onset   Breast cancer Mother    Heart attack Father    Diabetes Father    Heart disease Father    Lung cancer Paternal Grandmother    He indicated that his mother is deceased. He indicated that his father is deceased. He indicated that his maternal grandmother is deceased. He indicated that his maternal grandfather is deceased. He indicated that his paternal grandmother is deceased. He indicated that his paternal grandfather is deceased.  Social History    Social History   Socioeconomic History   Marital status: Single    Spouse name: Not on file   Number of children: Not on file   Years of education: Not on file   Highest education level: Not on file  Occupational History   Not on file  Tobacco Use   Smoking status: Every Day    Packs/day: 1.00    Years: 33.00    Total pack years: 33.00    Types: Cigarettes   Smokeless tobacco: Never  Vaping Use   Vaping Use:  Never used  Substance and Sexual Activity   Alcohol use: Not Currently    Alcohol/week: 0.0 - 1.0 standard drinks of alcohol   Drug use: No   Sexual activity: Not on file  Other Topics Concern   Not on file  Social History Narrative   Not on file   Social Determinants of Health   Financial Resource Strain: Not on file  Food Insecurity: Not on file  Transportation Needs: Not on file  Physical Activity: Not on file  Stress: Not on file  Social Connections: Not on file  Intimate Partner Violence: Not on file     Review of Systems    General:  No chills, fever, night sweats or weight changes.  Cardiovascular:  No chest pain, endorses dyspnea on exertion, edema, orthopnea, palpitations, paroxysmal nocturnal dyspnea. Dermatological: No rash, lesions/masses Respiratory: No cough, endorses dyspnea Urologic: No hematuria, dysuria Abdominal:   No nausea, vomiting, diarrhea, bright red blood per rectum, melena, or hematemesis Neurologic:  No visual changes, wkns, changes in mental status. All other systems reviewed and are otherwise negative except as noted above.   Physical Exam    VS:  BP 128/81 (BP Location: Left Arm, Patient Position: Sitting, Cuff Size: Normal)   Pulse 73   Ht 6' 2" (1.88 m)   Wt 238 lb 3.2 oz (108 kg)   SpO2 97%   BMI 30.58 kg/m  , BMI Body mass index is 30.58 kg/m.     GEN: Well nourished, well developed, in no acute distress.  Sitting in wheelchair. HEENT: normal. Neck: Supple, no JVD, carotid bruits, or masses. Cardiac: RRR, no murmurs, rubs, or gallops. No clubbing, cyanosis, edema.  Radials 2+/PT 2+ and equal bilaterally.  Respiratory:  Respirations regular and unlabored, clear to auscultation bilaterally. GI: Soft, nontender, nondistended, BS + x 4. MS: no deformity or atrophy. Skin: warm and dry, no rash. Neuro:    Strength and sensation are intact. Psych: Normal affect.  Accessory Clinical Findings    ECG personally reviewed by me today-no  new tracings today  Lab Results  Component Value Date   WBC 14.5 (H) 12/19/2021   HGB 12.7 (L) 12/19/2021   HCT 39.5 12/19/2021   MCV 94.3 12/19/2021   PLT 152 12/19/2021   Lab Results  Component Value Date   CREATININE 1.69 (H) 12/19/2021   BUN 40 (H) 12/19/2021   NA 140 12/19/2021   K 4.4 12/19/2021   CL 106 12/19/2021   CO2 21 (L) 12/19/2021   Lab Results  Component Value Date   ALT 21 12/19/2021   AST 28 12/19/2021   ALKPHOS 146 (H) 12/19/2021   BILITOT 1.2 12/19/2021   Lab Results  Component Value Date   CHOL 119 04/11/2021   HDL 34 (L) 04/11/2021   LDLCALC 62 04/11/2021   LDLDIRECT 132.0 12/05/2015   TRIG 131 04/11/2021   CHOLHDL 3.5 04/11/2021    Lab Results  Component Value Date   HGBA1C 8.7 (H) 12/19/2021    Assessment & Plan   1.  HFrEF with LVEF of 40 to 45% by echo 11/2021.  He is continued on spironolactone 12.5 daily, losartan 12.5 daily, Jardiance 10 mg daily.  Losartan was recently restarted and had previously been held due to hypotension.  Not currently on beta-blocker therapy for that reason.  Continue to escalate GDMT as tolerated by blood pressure.  2.  Hypertension blood pressure is 128/81.  During recent hospitalization required pressors.  He is continued on Jardiance and losartan and spironolactone at this time.  Blood pressure continues to be monitored by the facility continued to show occasional soft blood pressures with Sbp 100-108.  3.  Coronary artery disease status post DES to the RCA 01/2021.  Cath on 02/05/2021 recommended DAPT with aspirin and Brilinta for at least 12 months.  It was okay to hold Brilinta 5 days prior to his procedure.  He was to continue aspirin in the preoperative.  Prior catheter with medications past 12 months please continue with Plavix long-term.  Since surgery aspirin has been discontinued and he was continued on Brilinta, can consider changing him to clopidogrel monotherapy at return since he has been on and off of  Brilinta for surgical procedures during his heart catheterization he did have very late stent thrombosis and will need continued antiplatelet therapy long-term.  4.  Hyperlipidemia associated with type 2 diabetes he is continued on atorvastatin 80 mg daily with ongoing management per PCP.  5.  Anemia noted on discharge from the hospital with hemoglobin of 7.7 on 05/29/2022.  With his dyspnea on exertion with PT we will order a CBC and iron studies today.  He is also advised to have him follow-up with a pulse ox during therapy to see what his oxygen level is during exercise.  He should have his labs drawn at the facility and then those results sent to the office.  He states that he is taking iron therapy as well 325 mg daily with breakfast this is continued as well.  6.  Disposition patient return to clinic to see MD/APP in 3 months or sooner if needed.  SHERI HAMMOCK, NP 07/26/2022, 3:14 PM    

## 2022-07-26 ENCOUNTER — Ambulatory Visit: Payer: Medicaid Other | Attending: Medical | Admitting: Cardiology

## 2022-07-26 ENCOUNTER — Encounter: Payer: Self-pay | Admitting: Cardiology

## 2022-07-26 VITALS — BP 128/81 | HR 73 | Ht 74.0 in | Wt 238.2 lb

## 2022-07-26 DIAGNOSIS — I5022 Chronic systolic (congestive) heart failure: Secondary | ICD-10-CM | POA: Diagnosis not present

## 2022-07-26 DIAGNOSIS — I2510 Atherosclerotic heart disease of native coronary artery without angina pectoris: Secondary | ICD-10-CM | POA: Diagnosis not present

## 2022-07-26 DIAGNOSIS — I10 Essential (primary) hypertension: Secondary | ICD-10-CM | POA: Diagnosis not present

## 2022-07-26 DIAGNOSIS — R0600 Dyspnea, unspecified: Secondary | ICD-10-CM

## 2022-07-26 DIAGNOSIS — E785 Hyperlipidemia, unspecified: Secondary | ICD-10-CM | POA: Diagnosis present

## 2022-07-26 DIAGNOSIS — E1169 Type 2 diabetes mellitus with other specified complication: Secondary | ICD-10-CM

## 2022-07-26 NOTE — Patient Instructions (Signed)
Medication Instructions:  Your Physician recommend you continue on your current medication as directed.    *If you need a refill on your cardiac medications before your next appointment, please call your pharmacy*   Lab Work: Please have the following labs drawn at facility (CBC, Iron study)   Testing/Procedures: None ordered    Follow-Up: At [facility], you and your health needs are our priority.  As part of our continuing mission to provide you with exceptional heart care, we have created designated Provider Care Teams.  These Care Teams include your primary Cardiologist (physician) and Advanced Practice Providers (APPs -  Physician Assistants and Nurse Practitioners) who all work together to provide you with the care you need, when you need it.  We recommend signing up for the patient portal called "MyChart".  Sign up information is provided on this After Visit Summary.  MyChart is used to connect with patients for Virtual Visits (Telemedicine).  Patients are able to view lab/test results, encounter notes, upcoming appointments, etc.  Non-urgent messages can be sent to your provider as well.   To learn more about what you can do with MyChart, go to [email or web address].    Your next appointment:   3 month(s)  The format for your next appointment:   In Person  Provider:   You may see Dr. Saunders Revel or one of the following Advanced Practice Providers on your designated Care Team:   Murray Hodgkins, NP Christell Faith, PA-C Cadence Kathlen Mody, PA-C Gerrie Nordmann, NP

## 2022-09-12 IMAGING — US US ABDOMEN LIMITED RUQ/ASCITES
1 series · 13 of 25 positions shown · non-contrast
Comparison: CT Abdomen and Pelvis 01/25/2017.
COMPARISON: CT Abdomen and Pelvis 01/25/2017.

Addendum:
CLINICAL DATA: 50-year-old male with abnormal transaminases.

EXAM:
ULTRASOUND ABDOMEN LIMITED RIGHT UPPER QUADRANT

[Series 1: us abdomen limited ruq (liver/gb) · 13 of 50 slices shown]
[im 1/50]
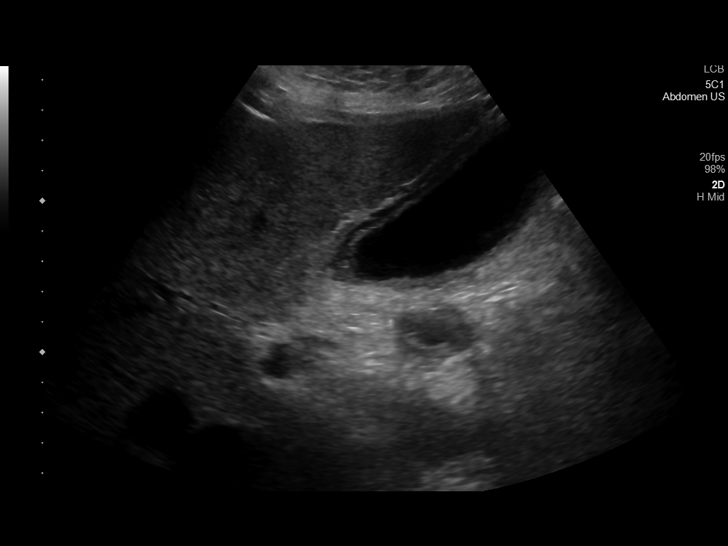
[im 5/50]
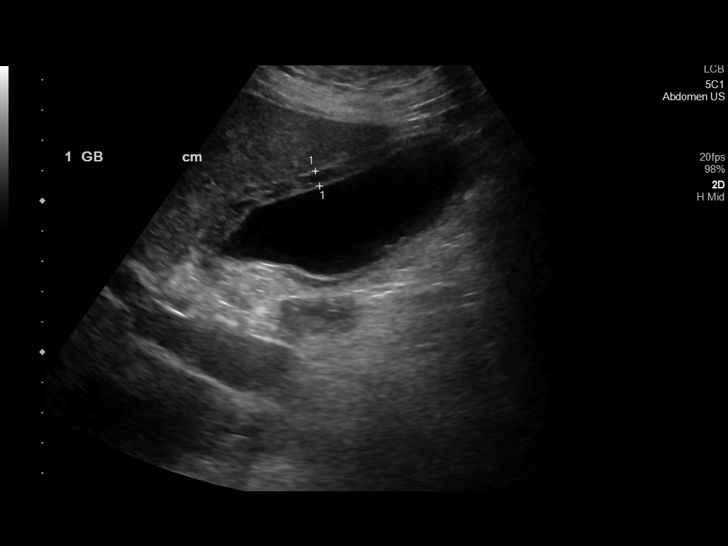
[im 9/50]
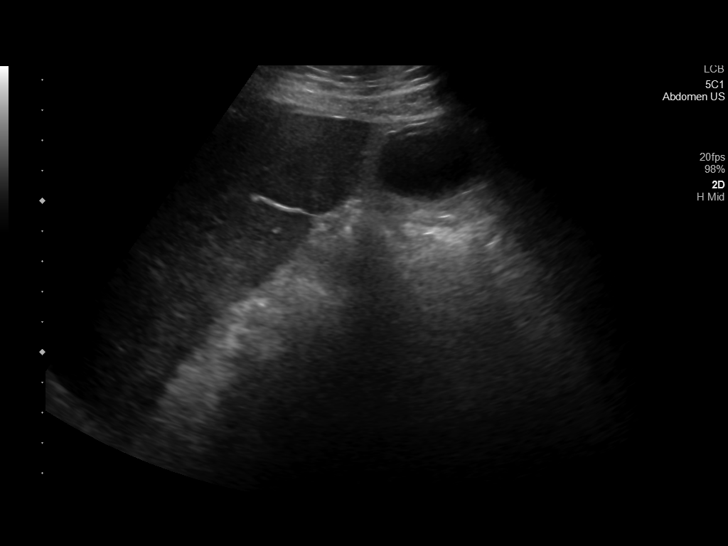
[im 13/50]
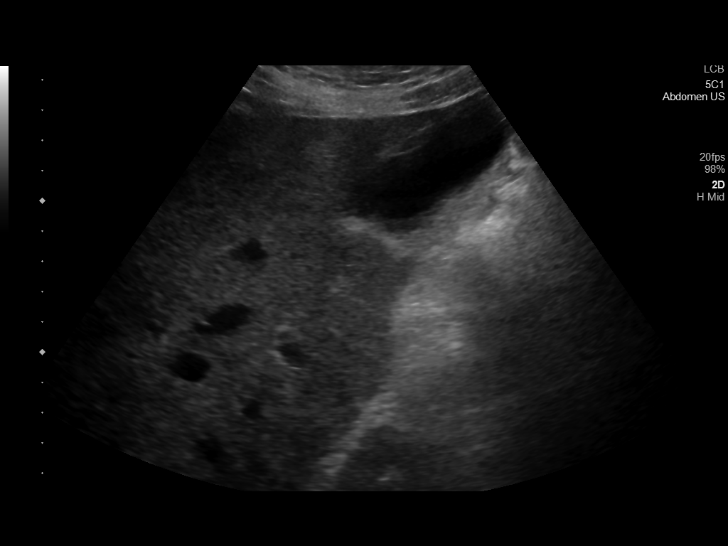
[im 17/50]
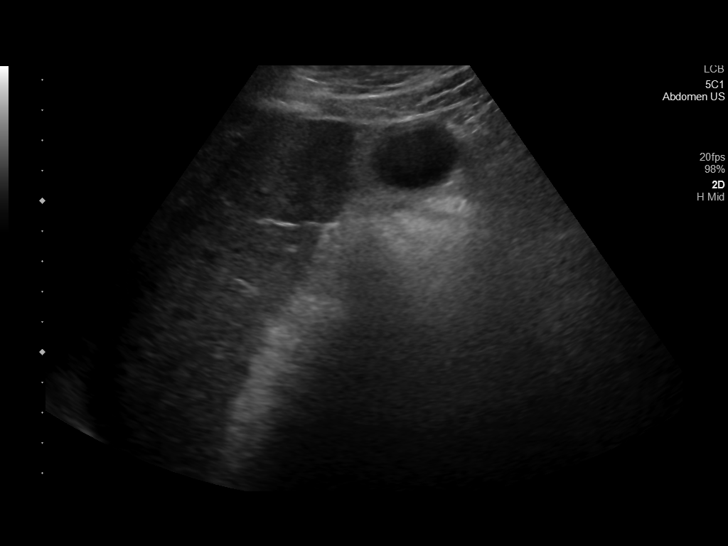
[im 21/50]
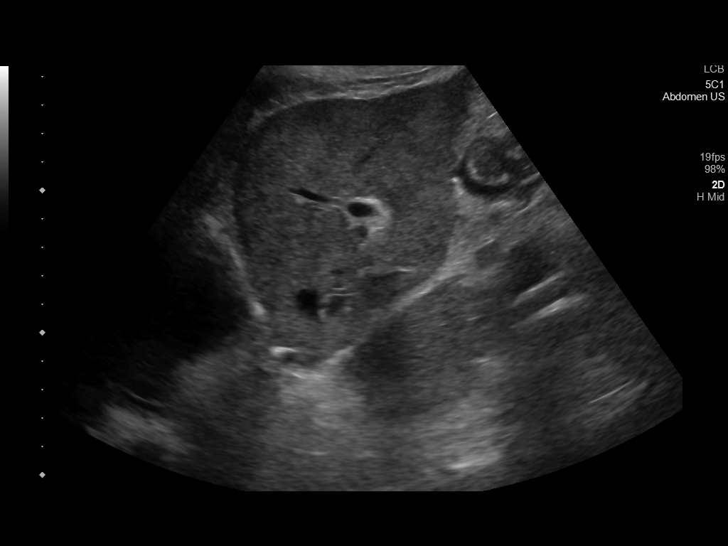
[im 25/50]
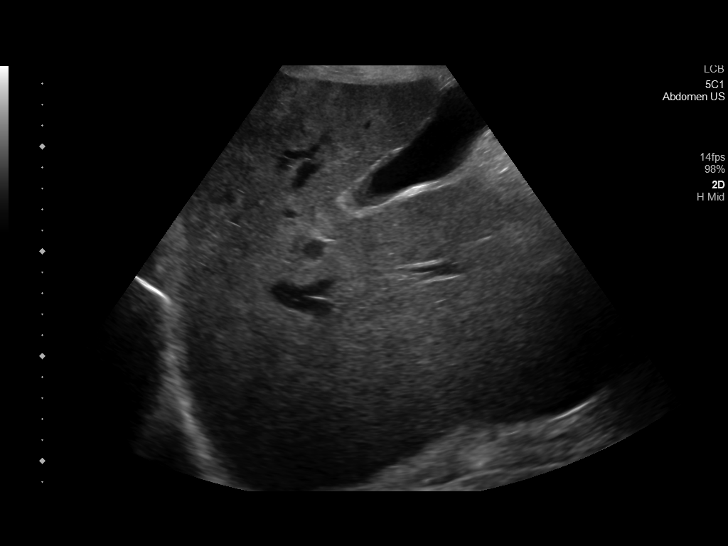
[im 29/50]
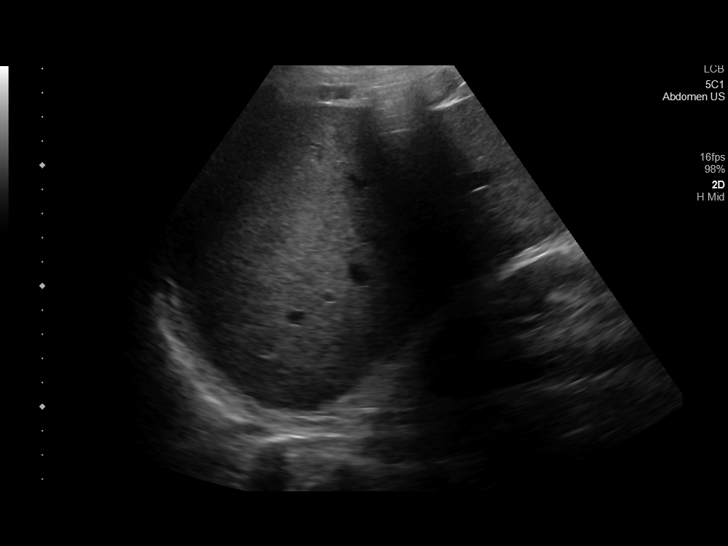
[im 33/50]
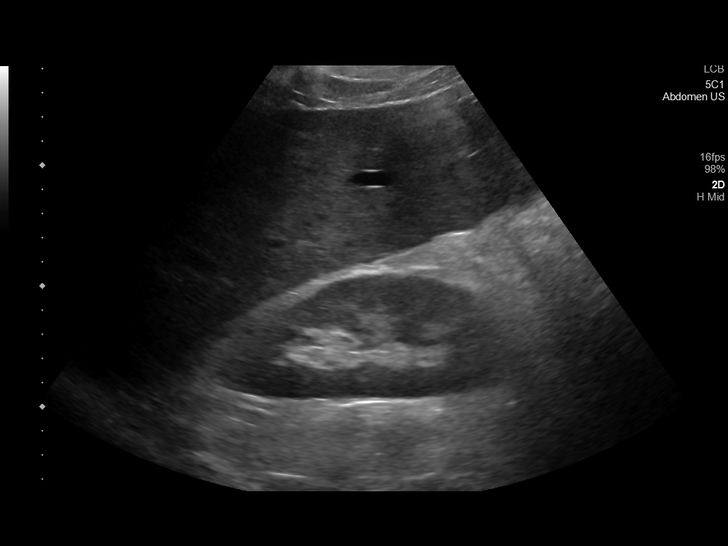
[im 37/50]
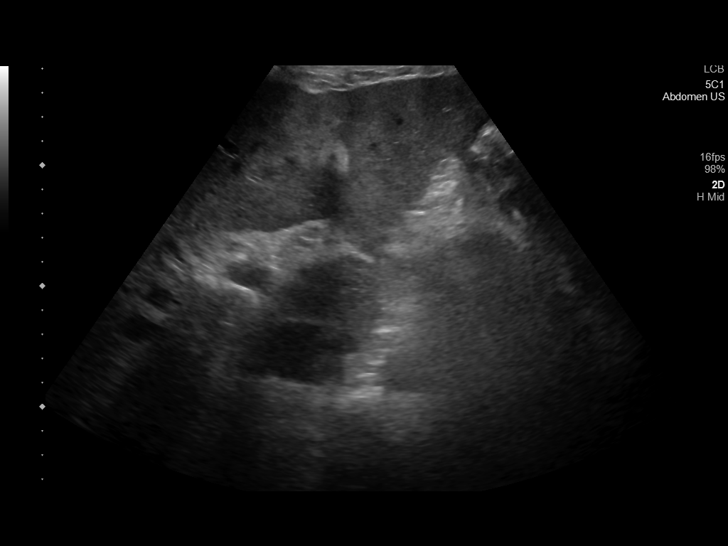
[im 41/50]
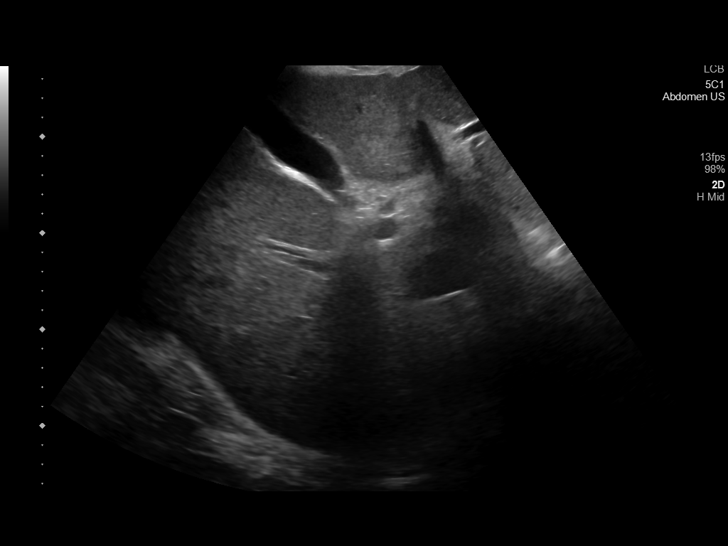
[im 45/50]
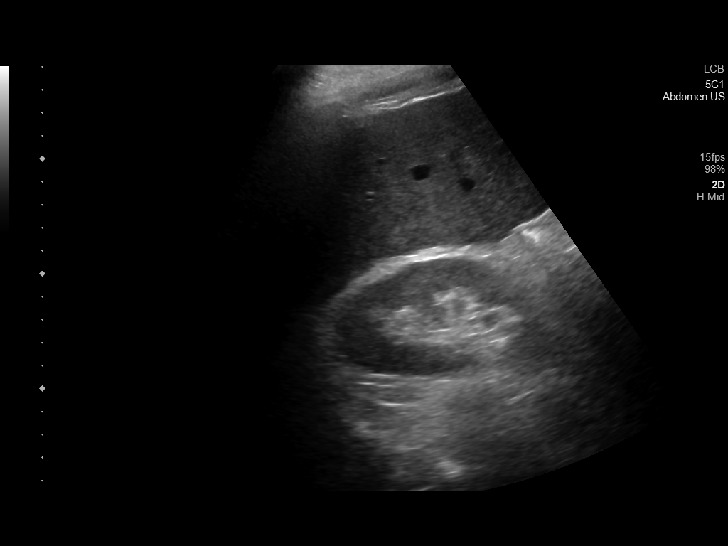
[im 50/50]
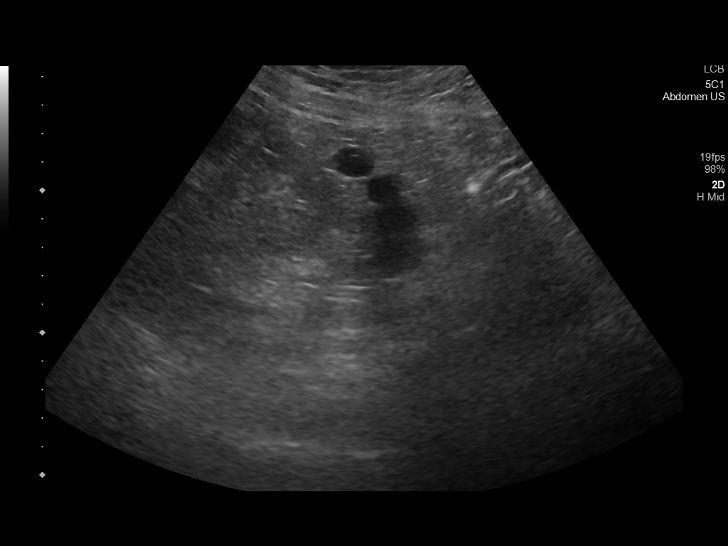

[13 of 25 positions shown; findings below may reference images not displayed]

FINDINGS: Gallbladder:

Abnormal gallbladder wall thickening and/or edema with trace
pericholecystic fluid (image 2). Combined the abnormal wall
thickness is 5 mm or greater. Only occasional small gallstones are
suspected, such as on image 15 measuring roughly 8 mm. Positive
sonographic Murphy sign.

Common bile duct:

Diameter: 4 mm, normal.

Liver:

No intrahepatic biliary ductal dilatation identified. No discrete
liver lesion. Background liver echogenicity within normal limits
(images 32, 35). Questionable nodular liver contour (images 38, 41).
Portal vein is patent on color Doppler imaging with normal direction
of blood flow towards the liver.

Other: Negative visible right kidney.
IMPRESSION: 1. Generalized gallbladder edema and positive sonographic Murphy
sign. Only occasional small gallstones identified.
In this setting differential considerations include gallbladder
edema secondary to cirrhosis/hepatitis (see #2) versus Acute
Calculus or Acalculous Cholecystitis.
Follow-up nuclear medicine hepatobiliary scan may be valuable to
evaluate patency of the cystic duct.

2. Mildly nodular liver contour raising the possibility of
cirrhosis. No discrete liver lesion.

3. No evidence of biliary duct obstruction.

ADDENDUM:
Study discussed by telephone with Dr. ANDRYAS ALAGHA on 09/30/2020 at
6333 hours.

*** End of Addendum ***
FINDINGS: Gallbladder:

Abnormal gallbladder wall thickening and/or edema with trace
pericholecystic fluid (image 2). Combined the abnormal wall
thickness is 5 mm or greater. Only occasional small gallstones are
suspected, such as on image 15 measuring roughly 8 mm. Positive
sonographic Murphy sign.

Common bile duct:

Diameter: 4 mm, normal.

Liver:

No intrahepatic biliary ductal dilatation identified. No discrete
liver lesion. Background liver echogenicity within normal limits
(images 32, 35). Questionable nodular liver contour (images 38, 41).
Portal vein is patent on color Doppler imaging with normal direction
of blood flow towards the liver.

Other: Negative visible right kidney.
IMPRESSION: 1. Generalized gallbladder edema and positive sonographic Murphy
sign. Only occasional small gallstones identified.
In this setting differential considerations include gallbladder
edema secondary to cirrhosis/hepatitis (see #2) versus Acute
Calculus or Acalculous Cholecystitis.
Follow-up nuclear medicine hepatobiliary scan may be valuable to
evaluate patency of the cystic duct.

2. Mildly nodular liver contour raising the possibility of
cirrhosis. No discrete liver lesion.

3. No evidence of biliary duct obstruction.

## 2022-09-16 ENCOUNTER — Telehealth: Payer: Self-pay | Admitting: Interventional Cardiology

## 2022-09-16 NOTE — Telephone Encounter (Signed)
Ms. [name] with nursing facility [facility] called [facility] Triage with a question regarding Pt's Brilinta.  Pt had Laminectomy / spine surgery on 05/16/2022, and orders were received by surgeon to stop Brilinta 90 mg, BID for procedure.    Ms. [name] was calling to find out if she could have orders now to restart his Brilinta, and wanted to consult cardiology?  I told her I would ask Dr. Irish Lack and his RN their thoughts / consult as [facility] is also involved with Pt plan of care?  Will message Dr. Irish Lack / RN regarding this question.

## 2022-09-16 NOTE — Telephone Encounter (Signed)
Left voicemail to call the clinic on 09/16/22 @ 1109.

## 2022-09-16 NOTE — Telephone Encounter (Signed)
Patient follows with Dr. Saunders Revel.

## 2022-09-16 NOTE — Telephone Encounter (Signed)
Megan from [employer] calling back.

## 2022-09-16 NOTE — Telephone Encounter (Signed)
Pt c/o medication issue:  1. Name of Medication: ticagrelor (BRILINTA) 90 MG TABS tablet   2. How are you currently taking this medication (dosage and times per day)? Not currently taking  3. Are you having a reaction (difficulty breathing--STAT)? no  4. What is your medication issue? Megan with nursing facility, [name] in [place], states the patient stopped his brilinta for a procedure the end of last year. She says he has not yet started it back and they would like guidance on how he should. Phone: [phone]

## 2022-09-17 NOTE — Telephone Encounter (Signed)
I recommend that he resume ticagrelor albeit at the 60 mg twice daily dosing, as he is more than 12 months out from his STEMI/PCI.  We will readdress his long-term antiplatelet strategy at our next follow-up visit.  Nelva Bush, MD [facility]

## 2022-09-18 MED ORDER — TICAGRELOR 60 MG PO TABS: 90.0000 mg | ORAL_TABLET | Freq: Two times a day (BID) | ORAL | Status: DC

## 2022-09-18 NOTE — Telephone Encounter (Signed)
[name] with [name] returning call.

## 2022-09-18 NOTE — Telephone Encounter (Signed)
Left message to call back  

## 2022-09-18 NOTE — Telephone Encounter (Signed)
[name] made aware of MD's recommendation to resume Brilinta 60 mg BID. [name] stated no prescription needed as they can take a verbal order and send it to facility pharmacy.

## 2022-10-16 ENCOUNTER — Encounter
Payer: Medicaid Other | Attending: Physical Medicine and Rehabilitation | Admitting: Physical Medicine and Rehabilitation

## 2022-10-16 ENCOUNTER — Encounter: Payer: Self-pay | Admitting: Physical Medicine and Rehabilitation

## 2022-10-16 VITALS — BP 122/82 | HR 75

## 2022-10-16 DIAGNOSIS — R252 Cramp and spasm: Secondary | ICD-10-CM | POA: Insufficient documentation

## 2022-10-16 DIAGNOSIS — E1169 Type 2 diabetes mellitus with other specified complication: Secondary | ICD-10-CM | POA: Insufficient documentation

## 2022-10-16 DIAGNOSIS — M869 Osteomyelitis, unspecified: Secondary | ICD-10-CM | POA: Insufficient documentation

## 2022-10-16 DIAGNOSIS — G8254 Quadriplegia, C5-C7 incomplete: Secondary | ICD-10-CM | POA: Diagnosis not present

## 2022-10-16 DIAGNOSIS — R269 Unspecified abnormalities of gait and mobility: Secondary | ICD-10-CM | POA: Diagnosis not present

## 2022-10-16 NOTE — Patient Instructions (Signed)
Exam: attached for nursing home Awake, alert, appropriate, in manual w/c, NAD  RUE- biceps 4-/5. WE 4/5; Triceps 4-/5; grip 4-/5 and FA 3+/5 LUE- biceps 4+/5; WE 4+/5; triceps 4/5; grip 4/5 and FA 4+/5 RLE- HF 4+/5; KE/KF 5-/5; DF 4-/5- but has TMA and PF 4/5 LLE- HF 4+/5; KE/KF 4+/5; DF 2/5 and PF 4-/5 RLE TMA is healed  Skin: has redness on LLE- wearing no shoe on L due to  ulcer on lateral malleolus area Neuro: Some spasticity RUE- MAS of 1+ in R hand, fingers; MAS of 1 to 1+ in R elbow- lacking some Range in R external rotation on R shoulder. 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   Swelling in hands better- resolved       Assessment & Plan:   Pt is a 52 yr old male with hx of DM (A1c 8.5 in 5/23), HTN, STEMI s/p PCI with stent placement-- on Eliquis-  EF 40-45% ; S/P C4-T1 surgery for cervical myelopthy 9/22 s/p prevertebral abscess with C4-T1 s/p hardware removal and washouts multiple times in 5/23.  Following with ID. Also is an incomplete ASIA D quadriplegic.  Also has carpal tunnel syndrome and cubital tunnel syndrome B/L - hasn't had surgery yet for wrists or elbows.     Con't Duloxetine- but can increase to 120 mg nightly- to help cold /nerve pain. To help with RUE nerve pain. Max dose is 120 mg daily.    2.  Increase baclofen a little to 15 mg 3x/day for spasticity mainly of RUE- has mild spasticity currently- so I don't think Botox is appropriate right now, but might need in future, since it can get worse over time.    3. No  Bowel and bladder issues.    4. Educated on spasticity- can get worse for up to 1-2 years- less likely to get worse, but if does, will re-evaluate for Botox of RUE.   5. Went over that he's what's called an ASIA D- Spinal cord injry patient- can still get get improvement for up to 1 year s/p surgery.    6. F/U in 3 months - double appointment- SCI to follow up on spasticity and nerve pain  7. Con't Oxycodone as needed for severe pain.

## 2022-10-16 NOTE — Progress Notes (Signed)
Subjective:    Patient ID: Frank Dolin., male    DOB: February 06, 1971, 52 y.o.   MRN: [id number]  HPI  Pt is a 52 yr old male with hx of DM (A1c 8.5 in 5/23), HTN, STEMI s/p PCI with stent placement-- on Eliquis-  EF 40-45% ; S/P C4-T1 surgery for cervical myelopthy 9/22 s/p prevertebral abscess with C4-T1 s/p hardware removal and washouts multiple times in 5/23.  Following with ID. Also has carpal tunnel syndrome and cubital tunnel syndrome B/L - hasn't had surgery yet for wrists or elbows.     Back on Baclofen 10 mg TID- since I asked for it to be restarted.   On Duloxetine 60 mg nightly  Things doing better-   Ended up having surgery- September 2023 had surgery- doesn't remember what surgery he had. Hurt "for awhile".  Had it at [facility].   Had posterior neck surgery-   Burning pain much better since surgery.  Has to keep R hand in pocket feels cold to him- not cold to touch, but is to him- keeping in a pocket helps the cold feeling.   Draws into a fist a lot.   Walking some- uses RW to walk- can walk somewhere 50-100 ft at a time-  Not in therapy anymore- insurance won't let him get more therapy.  Had therapy from September til 2 weeks ago.     RUE and elbow and wrist/fingers, is still numb and tingling. Was both of them, but now just RUE.   Going home- as soon as can pull pants up with R hand yet.  Hand  arm and shoulder on R will lock up on him   Main issue is function other than RUE N/T and drawing up  R hand draws up if actually gets cold or gets "wind" on it.    Pain Inventory Average Pain 4 Pain Right Now 4 My pain is tingling  LOCATION OF PAIN  wrist, hand  BOWEL Number of stools per week: 14 Oral laxative use No  Type of laxative . Enema or suppository use Yes  History of colostomy No  Incontinent No   BLADDER Normal In and out cath, frequency . Able to self cath  . Bladder incontinence No  Frequent urination No  Leakage with  coughing No  Difficulty starting stream No  Incomplete bladder emptying No    Mobility use a walker how many minutes can you walk? 15 ability to climb steps?  yes do you drive?  no use a wheelchair  Function not employed: date last employed . I need assistance with the following:  bathing and toileting  Neuro/Psych numbness tingling  Prior Studies Any changes since last visit?  no  Physicians involved in your care Any changes since last visit?  no   Family History  Problem Relation Age of Onset   Breast cancer Mother    Heart attack Father    Diabetes Father    Heart disease Father    Lung cancer Paternal Grandmother    Social History   Socioeconomic History   Marital status: Single    Spouse name: Not on file   Number of children: Not on file   Years of education: Not on file   Highest education level: Not on file  Occupational History   Not on file  Tobacco Use   Smoking status: Every Day    Packs/day: 1.00    Years: 33.00    Total pack years:  33.00    Types: Cigarettes   Smokeless tobacco: Never  Vaping Use   Vaping Use: Never used  Substance and Sexual Activity   Alcohol use: Not Currently    Alcohol/week: 0.0 - 1.0 standard drinks of alcohol   Drug use: No   Sexual activity: Not on file  Other Topics Concern   Not on file  Social History Narrative   Not on file   Social Determinants of Health   Financial Resource Strain: Not on file  Food Insecurity: Not on file  Transportation Needs: Not on file  Physical Activity: Not on file  Stress: Not on file  Social Connections: Not on file   Past Surgical History:  Procedure Laterality Date   AMPUTATION Right 10/01/2020   Procedure: AMPUTATION 5th  RAY AND IRRIGATION AND DEBRIDEMENT;  Surgeon: Samara Deist, DPM;  Location: [facility];  Service: Podiatry;  Laterality: Right;   AMPUTATION Right 10/11/2020   Procedure: AMPUTATION RAY;  Surgeon: Caroline More, DPM;  Location: [facility];  Service:  Podiatry;  Laterality: Right;   ANTERIOR CERVICAL DECOMPRESSION/DISCECTOMY FUSION 4 LEVELS N/A 04/25/2021   Procedure: Cervical Four-Five, Cervical Five-Six, Cervical Six-Seven, Cervical Seven- Thoracic One  Anterior cervical decompression/Discectomy/fusion;  Surgeon: Judith Part, MD;  Location: [place];  Service: Neurosurgery;  Laterality: N/A;  Cervical Four-Five, Cervical Five-Six, Cervical Six-Seven, Cervical Seven- Thoracic One  Anterior cervical decompression/Discectomy/fusion   BACK SURGERY     CARDIAC CATHETERIZATION     CORONARY THROMBECTOMY N/A 01/19/2021   Procedure: Coronary Thrombectomy;  Surgeon: Jettie Booze, MD;  Location: [place] [facility];  Service: Cardiovascular;  Laterality: N/A;   CORONARY/GRAFT ACUTE MI REVASCULARIZATION N/A 01/19/2021   Procedure: Coronary/Graft Acute MI Revascularization;  Surgeon: Jettie Booze, MD;  Location: [place] [facility];  Service: Cardiovascular;  Laterality: N/A;   IABP INSERTION N/A 01/19/2021   Procedure: IABP Insertion;  Surgeon: Jettie Booze, MD;  Location: [place] [facility];  Service: Cardiovascular;  Laterality: N/A;   INCISION AND DRAINAGE Right 09/29/2020   Procedure: INCISION AND DRAINAGE, RIGHT FOOT;  Surgeon: Samara Deist, DPM;  Location: [facility];  Service: Podiatry;  Laterality: Right;   LEFT HEART CATH AND CORONARY ANGIOGRAPHY N/A 01/19/2021   Procedure: LEFT HEART CATH AND CORONARY ANGIOGRAPHY;  Surgeon: Jettie Booze, MD;  Location: [place] [facility];  Service: Cardiovascular;  Laterality: N/A;   LOWER EXTREMITY ANGIOGRAPHY Right 10/03/2020   Procedure: Lower Extremity Angiography;  Surgeon: Katha Cabal, MD;  Location: [place] [facility];  Service: Cardiovascular;  Laterality: Right;   TENDON LENGTHENING Right 10/11/2020   Procedure: TENDON LENGTHENING;  Surgeon: Caroline More, DPM;  Location: [facility];  Service: Podiatry;  Laterality: Right;   Past Medical History:   Diagnosis Date   CHF (congestive heart failure) ([place])    Coronary artery disease    Diabetes mellitus without complication ([place])    Ischemic cardiomyopathy    Myocardial infarction [facility])    inferior STEMI ~ 2016; 01/19/21 with CHB/cardiogenic shock requiring brief TVP, IABP, s/p aspiration thrombectomy/ballon angioplasty for late thrombosis mRCA stent & DES for acute occlusion mLCX   BP 122/82   Pulse 75   SpO2 97%   Opioid Risk Score:   Fall Risk Score:  `1  Depression screen [facility] 2/9     04/08/2022   11:33 AM 12/05/2015    2:26 PM 04/13/2015    1:43 PM  Depression screen PHQ 2/9  Decreased Interest 0 0 0  Down,  Depressed, Hopeless 0 0 0  PHQ - 2 Score 0 0 0  Altered sleeping 3    Tired, decreased energy 3    Change in appetite 0    Feeling bad or failure about yourself  0    Trouble concentrating 0    Moving slowly or fidgety/restless 0    Suicidal thoughts 0    PHQ-9 Score 6       Review of Systems  Musculoskeletal:        Wrist pain Hand pain  All other systems reviewed and are negative.     Objective:   Physical Exam Awake, alert, appropriate, in manual w/c, NAD  RUE- biceps 4-/5. WE 4/5; Triceps 4-/5; grip 4-/5 and FA 3+/5 LUE- biceps 4+/5; WE 4+/5; triceps 4/5; grip 4/5 and FA 4+/5 RLE- HF 4+/5; KE/KF 5-/5; DF 4-/5- but has TMA and PF 4/5 LLE- HF 4+/5; KE/KF 4+/5; DF 2/5 and PF 4-/5 RLE TMA is healed  Skin: has redness on LLE- wearing no shoe on L due to  ulcer on lateral malleolus area Neuro: Some spasticity RUE- MAS of 1+ in R hand, fingers; MAS of 1 to 1+ in R elbow- lacking some Range in R external rotation on R shoulder. 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   Swelling in hands better- resolved       Assessment & Plan:   Pt is a 52 yr old male with hx of DM (A1c 8.5 in 5/23), HTN, STEMI s/p PCI with  stent placement-- on Eliquis-  EF 40-45% ; S/P C4-T1 surgery for cervical myelopthy 9/22 s/p prevertebral abscess with C4-T1 s/p hardware removal and washouts multiple times in 5/23.  Following with ID. Also is an incomplete ASIA D quadriplegic.  Also has carpal tunnel syndrome and cubital tunnel syndrome B/L - hasn't had surgery yet for wrists or elbows.     Con't Duloxetine- but can increase to 120 mg nightly- to help cold /nerve pain. To help with RUE nerve pain. Max dose is 120 mg daily.    2.  Increase baclofen a little to 15 mg 3x/day for spasticity mainly of RUE- has mild spasticity currently- so I don't think Botox is appropriate right now, but might need in future, since it can get worse over time.    3. No  Bowel and bladder issues.    4. Educated on spasticity- can get worse for up to 1-2 years- less likely to get worse, but if does, will re-evaluate for Botox of RUE.   5. Went over that he's what's called an ASIA D- Spinal cord injry patient- can still get get improvement for up to 1 year s/p surgery.   6. F/U in 3 months - double appointment- SCI to follow up on spasticity and nerve pain  7. Con't Oxycodone as needed for severe pain.    I spent a total of  34  minutes on total care today- >50% coordination of care- due to discussing/educating pt on SCI, recovery, and spasticity as well as nerve pain.

## 2022-10-31 ENCOUNTER — Telehealth: Payer: Self-pay | Admitting: *Deleted

## 2022-10-31 ENCOUNTER — Encounter: Payer: Self-pay | Admitting: Internal Medicine

## 2022-10-31 ENCOUNTER — Other Ambulatory Visit
Admission: RE | Admit: 2022-10-31 | Discharge: 2022-10-31 | Disposition: A | Discharge status: Home / Self Care | Payer: Medicaid Other | Source: Ambulatory Visit | Attending: Internal Medicine | Admitting: Internal Medicine

## 2022-10-31 ENCOUNTER — Ambulatory Visit: Payer: Medicaid Other | Attending: Internal Medicine | Admitting: Internal Medicine

## 2022-10-31 VITALS — BP 118/78 | HR 79 | Ht 74.0 in | Wt 236.0 lb

## 2022-10-31 DIAGNOSIS — D649 Anemia, unspecified: Secondary | ICD-10-CM | POA: Diagnosis present

## 2022-10-31 DIAGNOSIS — I5022 Chronic systolic (congestive) heart failure: Secondary | ICD-10-CM | POA: Diagnosis present

## 2022-10-31 DIAGNOSIS — Z79899 Other long term (current) drug therapy: Secondary | ICD-10-CM

## 2022-10-31 DIAGNOSIS — E1169 Type 2 diabetes mellitus with other specified complication: Secondary | ICD-10-CM | POA: Insufficient documentation

## 2022-10-31 DIAGNOSIS — I2510 Atherosclerotic heart disease of native coronary artery without angina pectoris: Secondary | ICD-10-CM

## 2022-10-31 DIAGNOSIS — S81802A Unspecified open wound, left lower leg, initial encounter: Secondary | ICD-10-CM | POA: Insufficient documentation

## 2022-10-31 DIAGNOSIS — E785 Hyperlipidemia, unspecified: Secondary | ICD-10-CM

## 2022-10-31 LAB — LIPID PANEL
Cholesterol: 181 mg/dL (ref 0–200)
HDL: 50 mg/dL (ref >40)
LDL Cholesterol: 85 mg/dL (ref 0–99)
Total CHOL/HDL Ratio: 3.6 RATIO
Triglycerides: 229 mg/dL — ABNORMAL HIGH (ref <150)
VLDL: 46 mg/dL — ABNORMAL HIGH (ref 0–40)

## 2022-10-31 LAB — CBC
HCT: 43.2 % (ref 39.0–52.0)
Hemoglobin: 13.7 g/dL (ref 13.0–17.0)
MCH: 28.2 pg (ref 26.0–34.0)
MCHC: 31.7 g/dL (ref 30.0–36.0)
MCV: 88.9 fL (ref 80.0–100.0)
Platelets: 139 10*3/uL — ABNORMAL LOW (ref 150–400)
RBC: 4.86 MIL/uL (ref 4.22–5.81)
RDW: 19.8 % — ABNORMAL HIGH (ref 11.5–15.5)
WBC: 8.4 10*3/uL (ref 4.0–10.5)
nRBC: 0 % (ref 0.0–0.2)

## 2022-10-31 LAB — COMPREHENSIVE METABOLIC PANEL
ALT: 63 U/L — ABNORMAL HIGH (ref 0–44)
AST: 74 U/L — ABNORMAL HIGH (ref 15–41)
Albumin: 3.5 g/dL (ref 3.5–5.0)
Alkaline Phosphatase: 333 U/L — ABNORMAL HIGH (ref 38–126)
Anion gap: 11 (ref 5–15)
BUN: 20 mg/dL (ref 6–20)
CO2: 22 mmol/L (ref 22–32)
Calcium: 9 mg/dL (ref 8.9–10.3)
Chloride: 105 mmol/L (ref 98–111)
Creatinine, Ser: 1.05 mg/dL (ref 0.61–1.24)
GFR, Estimated: >60 mL/min (ref >60)
Glucose, Bld: 255 mg/dL — ABNORMAL HIGH (ref 70–99)
Potassium: 4.1 mmol/L (ref 3.5–5.1)
Sodium: 138 mmol/L (ref 135–145)
Total Bilirubin: 0.7 mg/dL (ref 0.3–1.2)
Total Protein: 7.6 g/dL (ref 6.5–8.1)

## 2022-10-31 MED ORDER — FUROSEMIDE 40 MG PO TABS: 40.0000 mg | ORAL_TABLET | Freq: Every day | ORAL | 3 refills | Status: DC

## 2022-10-31 NOTE — Patient Instructions (Signed)
Medication Instructions:  Your physician recommends the following medication changes.  START TAKING: Lasix 40 mg by mouth daily   *If you need a refill on your cardiac medications before your next appointment, please call your pharmacy*   Lab Work: Your provider would like for you to have following labs drawn: (CBC, CMP, Lipid).   Please go to the [facility] entrance and check in at the front desk.  You do not need an appointment.  They are open from 7am-6 pm.   If you have labs (blood work) drawn today and your tests are completely normal, you will receive your results only by: [facility] (if you have MyChart) OR A paper copy in the mail If you have any lab test that is abnormal or we need to change your treatment, we will call you to review the results.   Testing/Procedures: None ordered today   Follow-Up: At [facility], you and your health needs are our priority.  As part of our continuing mission to provide you with exceptional heart care, we have created designated Provider Care Teams.  These Care Teams include your primary Cardiologist (physician) and Advanced Practice Providers (APPs -  Physician Assistants and Nurse Practitioners) who all work together to provide you with the care you need, when you need it.  We recommend signing up for the patient portal called "MyChart".  Sign up information is provided on this After Visit Summary.  MyChart is used to connect with patients for Virtual Visits (Telemedicine).  Patients are able to view lab/test results, encounter notes, upcoming appointments, etc.  Non-urgent messages can be sent to your provider as well.   To learn more about what you can do with MyChart, go to [email or web address].    Your next appointment:   1 month(s)  Provider:   You may see Nelva Bush, MD or one of the following Advanced Practice Providers on your designated Care Team:   Murray Hodgkins, NP Christell Faith, PA-C [place]  Kathlen Mody, PA-C Gerrie Nordmann, NP

## 2022-10-31 NOTE — Progress Notes (Signed)
Follow-up Outpatient Visit Date: 10/31/2022  Primary Care Provider: Raelene Bott, MD [address] [place] [address]  Chief Complaint: Shortness of breath  HPI:  [name] is a 52 y.o. male with history of CAD status post multiple PCI's with STEMI in 01/2021 due to simultaneous late stent thrombosis to the mid RCA as well as occlusion of the left circumflex complicated by cardiogenic shock, HFrEF, due to ischemic cardiomyopathy with LVEF 40-45% by most recent echo in 11/2021, right lower extremity amputation, hypertension, hyperlipidemia, diabetes, and cervical myelopathy, who presents for follow-up of coronary artery disease and HFrEF.  He was last seen in our office in 07/2022 by Frank Nordmann, NP, at which time he was feeling fairly well, still recovering from cervical laminectomy in September.  He denied chest pain and shortness of breath.  No medication changes were made; addition of beta-blocker was deferred due to intermittent soft blood pressures at his rehab facility.  Today, [name] reports that he is most bothered by dyspnea on exertion.  This began a few weeks after his cervical spine surgery in 04/2022.  He can walk 190 feet without needing to rest.  He also complains of some intermittent leg swelling.  He also has wound is on his left shin and left heel, which are being managed at his nursing facility and are gradually healing.  He reports having completed a course of antibiotics.  He notes that he was off ticagrelor until about 3 weeks ago.  He actually feels a little better since restarting the medicine.  He has not had any chest pain, palpitations, or lightheadedness.  He is not doing any physical therapy but is trying to do some exercises on his own.  His leg strength has normalized.  He still has weakness and decreased motion of the right arm.  He reports that he needs a colonoscopy in the future, though he is uncertain why.  He was noted to have a  hemoglobin of 7.7 on the last check at [facility] in 05/2022.  --------------------------------------------------------------------------------------------------  Past Medical History:  Diagnosis Date   CHF (congestive heart failure) (HCC)    Coronary artery disease    Diabetes mellitus without complication ([facility])    Ischemic cardiomyopathy    Myocardial infarction [facility])    inferior STEMI ~ 2016; 01/19/21 with CHB/cardiogenic shock requiring brief TVP, IABP, s/p aspiration thrombectomy/ballon angioplasty for late thrombosis mRCA stent & DES for acute occlusion mLCX   Past Surgical History:  Procedure Laterality Date   AMPUTATION Right 10/01/2020   Procedure: AMPUTATION 5th  RAY AND IRRIGATION AND DEBRIDEMENT;  Surgeon: Samara Deist, DPM;  Location: [facility];  Service: Podiatry;  Laterality: Right;   AMPUTATION Right 10/11/2020   Procedure: AMPUTATION RAY;  Surgeon: Caroline More, DPM;  Location: [facility];  Service: Podiatry;  Laterality: Right;   ANTERIOR CERVICAL DECOMPRESSION/DISCECTOMY FUSION 4 LEVELS N/A 04/25/2021   Procedure: Cervical Four-Five, Cervical Five-Six, Cervical Six-Seven, Cervical Seven- Thoracic One  Anterior cervical decompression/Discectomy/fusion;  Surgeon: Judith Part, MD;  Location: [place];  Service: Neurosurgery;  Laterality: N/A;  Cervical Four-Five, Cervical Five-Six, Cervical Six-Seven, Cervical Seven- Thoracic One  Anterior cervical decompression/Discectomy/fusion   BACK SURGERY     CARDIAC CATHETERIZATION     CORONARY THROMBECTOMY N/A 01/19/2021   Procedure: Coronary Thrombectomy;  Surgeon: Jettie Booze, MD;  Location: [place] [facility];  Service: Cardiovascular;  Laterality: N/A;   CORONARY/GRAFT ACUTE MI REVASCULARIZATION N/A 01/19/2021   Procedure: Coronary/Graft Acute  MI Revascularization;  Surgeon: Jettie Booze, MD;  Location: [place] [facility];  Service: Cardiovascular;  Laterality: N/A;   IABP INSERTION N/A 01/19/2021   Procedure:  IABP Insertion;  Surgeon: Jettie Booze, MD;  Location: [place] [facility];  Service: Cardiovascular;  Laterality: N/A;   INCISION AND DRAINAGE Right 09/29/2020   Procedure: INCISION AND DRAINAGE, RIGHT FOOT;  Surgeon: Samara Deist, DPM;  Location: [facility];  Service: Podiatry;  Laterality: Right;   LEFT HEART CATH AND CORONARY ANGIOGRAPHY N/A 01/19/2021   Procedure: LEFT HEART CATH AND CORONARY ANGIOGRAPHY;  Surgeon: Jettie Booze, MD;  Location: [place] [facility];  Service: Cardiovascular;  Laterality: N/A;   LOWER EXTREMITY ANGIOGRAPHY Right 10/03/2020   Procedure: Lower Extremity Angiography;  Surgeon: Katha Cabal, MD;  Location: [place] [facility];  Service: Cardiovascular;  Laterality: Right;   TENDON LENGTHENING Right 10/11/2020   Procedure: TENDON LENGTHENING;  Surgeon: Caroline More, DPM;  Location: [facility];  Service: Podiatry;  Laterality: Right;    Current Meds  Medication Sig   acetaminophen (TYLENOL) 650 MG CR tablet Take by mouth as needed.   albuterol (PROVENTIL) (2.5 MG/3ML) 0.083% nebulizer solution Take 3 mLs (2.5 mg total) by nebulization every 4 (four) hours as needed for wheezing or shortness of breath.   ammonium lactate (LAC-HYDRIN) 12 % lotion Apply 1 Application topically as needed for dry skin.   atorvastatin (LIPITOR) 80 MG tablet Take 80 mg by mouth daily.   baclofen (LIORESAL) 10 MG tablet Take 5 mg by mouth 3 (three) times daily.   bismuth subsalicylate (PEPTO BISMOL) 262 MG/15ML suspension Take 30 mLs by mouth every 4 (four) hours as needed.   Dextrose, Diabetic Use, (INSTA-GLUCOSE) 77.4 % GEL Take 31 g by mouth.  Take 31 g (24 g of dextrose total) by mouth every fifteen (15) minutes as needed for low blood sugar.   DULoxetine (CYMBALTA) 60 MG capsule Take 120 mg by mouth at bedtime.   empagliflozin (JARDIANCE) 10 MG TABS tablet Take 10 mg by mouth daily.   ferrous sulfate 325 (65 FE) MG tablet Take 325 mg by mouth daily with  breakfast.   fluticasone (FLONASE) 50 MCG/ACT nasal spray Place 1 spray into both nostrils daily as needed.   Glucagon, rDNA, (GLUCAGON EMERGENCY) 1 MG KIT Inject into the muscle as needed.   insulin aspart (NOVOLOG) 100 UNIT/ML injection Inject 0-9 Units into the skin every 4 (four) hours. (Patient taking differently: Inject 0-9 Units into the skin every 4 (four) hours. Sliding scale)   insulin glargine (LANTUS) 100 UNIT/ML injection Inject 50 Units into the skin at bedtime.   insulin lispro (HUMALOG) 100 UNIT/ML injection Inject 7 Units into the skin 3 (three) times daily before meals. Per sliding scale   loperamide (IMODIUM) 2 MG capsule Take 2 mg by mouth as needed for diarrhea or loose stools.   losartan (COZAAR) 25 MG tablet Take 0.5 tablets by mouth daily.   melatonin 3 MG TABS tablet Take 3 mg by mouth at bedtime.   Menthol, Topical Analgesic, (BIOFREEZE) 4 % GEL Apply topically every 8 (eight) hours as needed.   metFORMIN (GLUCOPHAGE) 1000 MG tablet Take 2 tablets by mouth daily with breakfast.   nitroGLYCERIN (NITROSTAT) 0.4 MG SL tablet Place 0.4 mg under the tongue every 5 (five) minutes as needed for chest pain.   Oxycodone HCl 10 MG TABS Take 5 mg by mouth every 4 (four) hours as needed.   polyethylene glycol (MIRALAX /  GLYCOLAX) 17 g packet Take 17 g by mouth daily as needed.   pregabalin (LYRICA) 150 MG capsule Take 150 mg by mouth 3 (three) times daily.   spironolactone (ALDACTONE) 25 MG tablet Take 0.5 tablets (12.5 mg total) by mouth daily.   tamsulosin (FLOMAX) 0.4 MG CAPS capsule Take 0.4 mg by mouth at bedtime.   terbinafine (LAMISIL) 250 MG tablet Take 250 mg by mouth daily.   ticagrelor (BRILINTA) 60 MG TABS tablet Take 60 mg by mouth 2 (two) times daily.   vitamin C (ASCORBIC ACID) 250 MG tablet Take 250 mg by mouth daily.   [DISCONTINUED] ticagrelor (BRILINTA) 60 MG TABS tablet Take 1.5 tablets (90 mg total) by mouth 2 (two) times daily.    Allergies: Patient has no  known allergies.  Social History   Tobacco Use   Smoking status: Every Day    Packs/day: 1.00    Years: 33.00    Additional pack years: 0.00    Total pack years: 33.00    Types: Cigarettes   Smokeless tobacco: Never  Vaping Use   Vaping Use: Never used  Substance Use Topics   Alcohol use: Not Currently    Alcohol/week: 0.0 - 1.0 standard drinks of alcohol   Drug use: No    Family History  Problem Relation Age of Onset   Breast cancer Mother    Heart attack Father    Diabetes Father    Heart disease Father    Lung cancer Paternal Grandmother     Review of Systems: A 12-system review of systems was performed and was negative except as noted in the HPI.  --------------------------------------------------------------------------------------------------  Physical Exam: BP 118/78 (BP Location: Left Arm, Patient Position: Sitting, Cuff Size: Large)   Pulse 79   Ht '6\' 2"'$  (1.88 m)   Wt 236 lb (107 kg)   SpO2 98%   BMI 30.30 kg/m   General:  NAD. Neck: No JVD or HJR. Lungs: Clear to auscultation bilaterally without wheezes or crackles. Heart: Regular rate and rhythm with 1/6 systolic murmur.  No rubs or gallops. Abdomen: Soft, nontender, nondistended. Extremities: 1+ chronic appearing left lower extremity edema.  Healing abrasion noted overlying the left lateral shin with some surrounding erythema.  No warmth or discharge noted.  EKG: Normal sinus rhythm without abnormality.  Lab Results  Component Value Date   WBC 14.5 (H) 12/19/2021   HGB 12.7 (L) 12/19/2021   HCT 39.5 12/19/2021   MCV 94.3 12/19/2021   PLT 152 12/19/2021    Lab Results  Component Value Date   NA 140 12/19/2021   K 4.4 12/19/2021   CL 106 12/19/2021   CO2 21 (L) 12/19/2021   BUN 40 (H) 12/19/2021   CREATININE 1.69 (H) 12/19/2021   GLUCOSE 208 (H) 12/19/2021   ALT 21 12/19/2021    Lab Results  Component Value Date   CHOL 119 04/11/2021   HDL 34 (L) 04/11/2021   LDLCALC 62 04/11/2021    LDLDIRECT 132.0 12/05/2015   TRIG 131 04/11/2021   CHOLHDL 3.5 04/11/2021    --------------------------------------------------------------------------------------------------  ASSESSMENT AND PLAN: Coronary artery disease without angina: No chest pain reported.  Question if exertional dyspnea is an anginal equivalent.  I will recheck labs today including CBC, CMP, and lipid panel.  We will try gentle diuresis to see if that helps improve his dyspnea.  If not, we will consider moving forward with pharmacologic MPI at our next visit.  Continue ticagrelor 60 mg twice daily monotherapy.  Chronic  HFrEF: [name] still has lower extremity edema.  We will add furosemide 40 mg daily.  Check CBC and CMP today.  Last echo in 11/2021 showed some improvement in LVEF to 40-45%.  Continue current doses of empagliflozin, losartan, and spironolactone.  As blood pressure is a little higher today than at some prior visits, we can hopefully add low-dose beta-blocker once he achieves euvolemic status.  Left lower extremity wound: [name] reports that his wounds are healing with wound care at his nursing facility.  Chronic edema is likely playing a role.  Left foot pedal pulses are diminished.  Consider ordering ABIs at follow-up visit once diuresis has been attempted.  Acute on chronic anemia: Last CBC available for review was shortly after cervical spine surgery in 05/2022, when hemoglobin was quite low at 7.7.  We will repeat a CBC today.  I favor deferring colonoscopy until heart failure has been optimized +/- stress test.  Hyperlipidemia associated with type 2 diabetes mellitus: Check lipid panel and CMP today with plans to continue rosuvastatin 80 mg daily for target LDL less than 70.  Ongoing management of DM per PCP.  Follow-up: Return to clinic in 1 month.  Nelva Bush, MD 10/31/2022 9:29 AM

## 2022-10-31 NOTE — Telephone Encounter (Signed)
Robin from [facility] called to say that Mr Madera's cymbalta was increased 10/16/22 and it is helping, however, he is having more jerking motions. What do you recommend?

## 2022-11-04 NOTE — Telephone Encounter (Signed)
I gave [name] the information from Dr Dagoberto Ligas.

## 2022-11-07 ENCOUNTER — Other Ambulatory Visit: Payer: Self-pay

## 2022-11-07 DIAGNOSIS — Z79899 Other long term (current) drug therapy: Secondary | ICD-10-CM

## 2022-11-07 MED ORDER — ROSUVASTATIN CALCIUM 40 MG PO TABS: 40.0000 mg | ORAL_TABLET | Freq: Every day | ORAL | 3 refills | Status: AC

## 2022-12-05 ENCOUNTER — Encounter: Payer: Self-pay | Admitting: Cardiology

## 2022-12-05 ENCOUNTER — Ambulatory Visit: Payer: Medicaid Other | Attending: Cardiology | Admitting: Cardiology

## 2022-12-05 VITALS — BP 110/62 | HR 79 | Wt 228.0 lb

## 2022-12-05 DIAGNOSIS — I2510 Atherosclerotic heart disease of native coronary artery without angina pectoris: Secondary | ICD-10-CM | POA: Diagnosis not present

## 2022-12-05 DIAGNOSIS — I5022 Chronic systolic (congestive) heart failure: Secondary | ICD-10-CM

## 2022-12-05 DIAGNOSIS — D649 Anemia, unspecified: Secondary | ICD-10-CM | POA: Diagnosis present

## 2022-12-05 DIAGNOSIS — I10 Essential (primary) hypertension: Secondary | ICD-10-CM | POA: Diagnosis not present

## 2022-12-05 DIAGNOSIS — R0600 Dyspnea, unspecified: Secondary | ICD-10-CM

## 2022-12-05 DIAGNOSIS — E785 Hyperlipidemia, unspecified: Secondary | ICD-10-CM | POA: Diagnosis present

## 2022-12-05 DIAGNOSIS — E1169 Type 2 diabetes mellitus with other specified complication: Secondary | ICD-10-CM | POA: Diagnosis not present

## 2022-12-05 NOTE — Progress Notes (Signed)
Cardiology Office Note:   Date:  12/05/2022  ID:  Frank Squibb., DOB 1970-10-29, MRN [id number]  History of Present Illness:   Frank Ealy. is a 52 y.o. male with past medical history of coronary artery disease status post multiple PCI's with STEMI in 01/2021 to subcutaneous later stent thrombosis to the mid RCA as well as occlusion of the left circumflex complicated by cardiogenic shock, HFrEF due to ischemic cardiomyopathy, right lower extremity amputation, hypertension, hyperlipidemia, diabetes, and cervical myelopathy, who presents today for follow-up of his coronary artery disease.  Presented to [facility] 01/2021 for STEMI after a syncopal episode, was found by EMS and EKG showed ST elevations.  He underwent aspiration thrombectomy with PTCA of the mid RCA and stenting of the mid left circumflex.  He had transient complete heart block in the setting of his MI which resolved with revascularization.  Echocardiogram revealed LVEF of 40%.  He also had transient atrial fibrillation with RVR during the PCI.  He underwent cervical spinal fusion 04/2021 subsequently was admitted to this send/2023 with a 3-day history of dysphagia and underwent CT chest which were concerning for hardware migration and adjacent abscess.  He was briefly on Levophed and transferred to [facility] for ENT, neurosurgery, thoracic surgery evaluation.  Due to the size of the cervical abscess and supraglottic compression he was taken for an awake tracheostomy.  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  He tolerated the procedures well without incident.  He was discharged home.  Reports stable condition aspirin was restarted on discharge per Brilinta was held for 2 weeks.  He was last seen in clinic 10/31/2022 by Dr. Okey Dupre and reports he was being most bothered by his dyspnea on exertion.  He stated began a few weeks after cervical spine surgery 04/2022.  He complained about intermittent  leg swelling and also was found to have a wound on his left shin and left heel which was being managed at the nursing facility or gradually healing.  He stated he had been on antibiotics for approximately 3 rounds.  He was started on furosemide 40 mg daily and repeat blood work was ordered.  He returns to clinic today with continued complaints of dyspnea. There is some continued swelling on occasion but it has improved. Denies any chest pain but does have fatigue. Wound to his left lower leg has healed. He is continuing with trying to increase his activity but is bothered by the dyspnea. He stated he had labs drawn last week. Denies any recent hospitalizations or visits to the emergency department.   ROS: 10 point review of systems has been reviewed and considered negative with the exception of those listed in the HPI.   Studies Reviewed:    EKG:  No new tracings were completed today.  Limited TTE 12/03/21 1. Left ventricular ejection fraction, by estimation, is 40 to 45%. Left  ventricular ejection fraction by 2D MOD biplane is 45.1 %. The left  ventricle has mild to moderately decreased function. There is severe  hypokinesis of the left ventricular, entire   inferolateral wall.   2. Right ventricular systolic function is normal. The right ventricular  size is normal.   3. The mitral valve is normal in structure. No evidence of mitral valve  regurgitation.   4. The aortic valve was not well visualized. Aortic valve regurgitation  is not visualized. Aortic valve sclerosis is present, with no evidence of  aortic  valve stenosis.   5. The inferior vena cava is dilated in size with <50% respiratory  variability, suggesting right atrial pressure of 15 mmHg.   Risk Assessment/Calculations:              Physical Exam:   VS:  BP 110/62   Pulse 79   Wt 228 lb (103.4 kg)   SpO2 93%   BMI 29.27 kg/m    Wt Readings from Last 3 Encounters:  12/05/22 228 lb (103.4 kg)  10/31/22 236 lb (107  kg)  07/26/22 238 lb 3.2 oz (108 kg)     GEN: Well nourished, well developed in no acute distress, in wheelchair today NECK: No JVD; No carotid bruits CARDIAC: RRR, I/VI systolic murmur, without rubs, gallops RESPIRATORY:  Clear to auscultation without rales, wheezing or rhonchi  ABDOMEN: Soft, non-tender, non-distended EXTREMITIES:  No edema; No deformity, large scar to left lower extremity from wound that has healed  ASSESSMENT AND PLAN:   Coronary artery disease without current angina.  Patient does have exertional dyspnea which is concerning for anginal equivalent.  Repeat labs revealed an improved hemoglobin since discharge from [facility].  He was recently started on furosemide for improved his dyspnea.  Patient states he continues to have dyspnea that may be some slight improvement but it is not typically exertional.  With concern for anginal equivalent he has been scheduled for Lexiscan stress.  He is also continued on Brilinta 60 mg twice daily and rosuvastatin 40 mg.  Chronic HFrEF with last LVEF 11/2021 of 40-45%.  This was a slight improvement from previous.  He is continued on furosemide 40 mg daily, Jardiance, losartan, and spironolactone.  Still with some complaints of dyspnea today lower extremity edema has improved.  Blood pressure soft today.  Can consider adding low-dose beta-blocker therapy at return visit.  Hyperlipidemia associated with type 2 diabetes.  LDL 80 53/48/24.  He is continued on rosuvastatin 40 mg daily.  Patient stated he does have blood work drawn last week at the facility we will request those results.  LDL goal is 70 or less.  Consider adding Zetia on return.  Hypertension with a blood pressure today 110/62.  He is continued on furosemide, losartan, and spironolactone.  Blood pressure continues to be monitored by the facility.  Chronic anemia with last hemoglobin of 13.7 on 10/31/2022.  He is pending colonoscopy at [facility] but needs to have his cardiac workup completed  prior to the procedure.  Disposition patient return to clinic to see MD/APP once testing is completed to reevaluate his symptoms.    Shared Decision Making/Informed Consent The risks [chest pain, shortness of breath, cardiac arrhythmias, dizziness, blood pressure fluctuations, myocardial infarction, stroke/transient ischemic attack, nausea, vomiting, allergic reaction, radiation exposure, metallic taste sensation and life-threatening complications (estimated to be 1 in 10,000)], benefits (risk stratification, diagnosing coronary disease with., treatment guidance) and alternatives of a nuclear stress test were discussed in detail with Mr. Lipson and he agrees to proceed.   Signed, Maezie Justin, NP

## 2022-12-05 NOTE — Patient Instructions (Addendum)
Medication Instructions:  No changes at this time.   *If you need a refill on your cardiac medications before your next appointment, please call your pharmacy*   Lab Work: None  If you have labs (blood work) drawn today and your tests are completely normal, you will receive your results only by: MyChart Message (if you have MyChart) OR A paper copy in the mail If you have any lab test that is abnormal or we need to change your treatment, we will call you to review the results.   Testing/Procedures: [facility] MYOVIEW  Your caregiver has ordered a Stress Test with nuclear imaging. The purpose of this test is to evaluate the blood supply to your heart muscle. This procedure is referred to as a "Non-Invasive Stress Test." This is because other than having an IV started in your vein, nothing is inserted or "invades" your body. Cardiac stress tests are done to find areas of poor blood flow to the heart by determining the extent of coronary artery disease (CAD). Some patients exercise on a treadmill, which naturally increases the blood flow to your heart, while others who are  unable to walk on a treadmill due to physical limitations have a pharmacologic/chemical stress agent called Lexiscan . This medicine will mimic walking on a treadmill by temporarily increasing your coronary blood flow.   Please note: these test may take anywhere between 2-4 hours to complete  PLEASE REPORT TO [facility] MEDICAL MALL ENTRANCE  THE VOLUNTEERS AT THE FIRST DESK WILL DIRECT YOU WHERE TO GO  Date of Procedure:___________________________  Arrival Time for Procedure:_________________________  Instructions regarding medication:   _XX___ : Hold diabetes medication morning of procedure  _XX___:  Take half dose of insulin the night before and hold your other diabetic medications as well.    _XX___:  Hold other medications as follows: HOLD Furosemide & Spironolactone the morning of your procedure.   PLEASE NOTIFY THE  OFFICE AT LEAST 24 HOURS IN ADVANCE IF YOU ARE UNABLE TO KEEP YOUR APPOINTMENT.  [phone] AND  PLEASE NOTIFY NUCLEAR MEDICINE AT [facility] AT LEAST 24 HOURS IN ADVANCE IF YOU ARE UNABLE TO KEEP YOUR APPOINTMENT. [phone]  How to prepare for your Myoview test:  Do not eat or drink after midnight No caffeine for 24 hours prior to test No smoking 24 hours prior to test. Your medication may be taken with water.  If your doctor stopped a medication because of this test, do not take that medication. Ladies, please do not wear dresses.  Skirts or pants are appropriate. Please wear a short sleeve shirt. No perfume, cologne or lotion. Wear comfortable walking shoes. No heels!    Follow-Up: At [facility], you and your health needs are our priority.  As part of our continuing mission to provide you with exceptional heart care, we have created designated Provider Care Teams.  These Care Teams include your primary Cardiologist (physician) and Advanced Practice Providers (APPs -  Physician Assistants and Nurse Practitioners) who all work together to provide you with the care you need, when you need it.  We recommend signing up for the patient portal called "MyChart".  Sign up information is provided on this After Visit Summary.  MyChart is used to connect with patients for Virtual Visits (Telemedicine).  Patients are able to view lab/test results, encounter notes, upcoming appointments, etc.  Non-urgent messages can be sent to your provider as well.   To learn more about what you can do with MyChart, go to [email or web address].  Your next appointment:   Follow up after testing.   Provider:   Yvonne Kendall, MD or Charlsie Quest, NP

## 2022-12-13 ENCOUNTER — Encounter
Admission: RE | Admit: 2022-12-13 | Discharge: 2022-12-13 | Disposition: A | Discharge status: Home / Self Care | Payer: Medicaid Other | Source: Ambulatory Visit | Attending: Cardiology | Admitting: Cardiology

## 2022-12-13 DIAGNOSIS — R0600 Dyspnea, unspecified: Secondary | ICD-10-CM | POA: Diagnosis not present

## 2022-12-13 LAB — NM MYOCAR MULTI W/SPECT W/WALL MOTION / EF
LV dias vol: 189 mL (ref 62–150)
LV sys vol: 93 mL
Nuc Stress EF: 51 %
Peak HR: 96 {beats}/min
Rest HR: 76 {beats}/min
Rest Nuclear Isotope Dose: 10.4 mCi
SDS: 4
SRS: 4
SSS: 8
ST Depression (mm): 0 mm
Stress Nuclear Isotope Dose: 31.4 mCi
TID: 1.09

## 2022-12-13 MED ORDER — TECHNETIUM TC 99M TETROFOSMIN IV KIT
31.4200 | PACK | Freq: Once | INTRAVENOUS | Status: AC | PRN
  Administered 2022-12-13: 31.42 via INTRAVENOUS

## 2022-12-13 MED ORDER — TECHNETIUM TC 99M TETROFOSMIN IV KIT
10.3600 | PACK | Freq: Once | INTRAVENOUS | Status: AC | PRN
  Administered 2022-12-13: 10.36 via INTRAVENOUS

## 2022-12-13 MED ORDER — REGADENOSON 0.4 MG/5ML IV SOLN
0.4000 mg | Freq: Once | INTRAVENOUS | Status: AC
  Administered 2022-12-13: 0.4 mg via INTRAVENOUS

## 2022-12-15 NOTE — Progress Notes (Signed)
Large amount of artifact noted on exam. No EKG changes concerning for ischemia. Recommend repeating echocardiogram to ensure heart function has not decreased due to large amount of GI uptake EF of test can be erroneous.

## 2022-12-16 ENCOUNTER — Ambulatory Visit: Payer: Medicaid Other | Admitting: Cardiology

## 2022-12-16 ENCOUNTER — Telehealth: Payer: Self-pay | Admitting: *Deleted

## 2022-12-16 DIAGNOSIS — R0600 Dyspnea, unspecified: Secondary | ICD-10-CM

## 2022-12-16 NOTE — Telephone Encounter (Signed)
No answer. No voicemail. 

## 2022-12-16 NOTE — Telephone Encounter (Signed)
-----   Message from Charlsie Quest, NP sent at 12/15/2022  9:07 PM EDT ----- Large amount of artifact noted on exam. No EKG changes concerning for ischemia. Recommend repeating echocardiogram to ensure heart function has not decreased due to large amount of GI uptake EF of test can be erroneous.

## 2022-12-19 NOTE — Telephone Encounter (Signed)
Spoke with patients nurse Baird Lyons. Reviewed results and recommendations. He requested that we please fax report to them for his records. Fax # [phone]. Discussed order for echocardiogram and he requested that we call [phone] and ask for [facility] in order to schedule that appointment. He verbalized understanding of our conversation with no further questions at this time.

## 2022-12-24 ENCOUNTER — Ambulatory Visit: Payer: Medicaid Other | Admitting: Cardiology

## 2023-01-15 ENCOUNTER — Encounter: Payer: Medicaid Other | Admitting: Physical Medicine and Rehabilitation

## 2023-01-16 ENCOUNTER — Ambulatory Visit: Payer: Medicaid Other | Attending: Cardiology

## 2023-01-20 ENCOUNTER — Ambulatory Visit: Payer: Medicaid Other | Admitting: Cardiology

## 2023-01-20 NOTE — Progress Notes (Deleted)
Cardiology Office Note:   Date:  01/20/2023  ID:  Frank Squibb., DOB [birth year], MRN [id number] PCP: Lindwood Qua, MD  [facility] Providers Cardiologist:  Lance Muss, MD { Click to update primary MD,subspecialty MD or APP then REFRESH:1}   History of Present Illness:   Frank Flight. is a 52 y.o. male with a past medical history of coronary disease status post multiple PCI's with STEMI in 01/2021 due to simultaneous stent thrombosis to CAD as well as occlusion of the left circumflex complicated by cardiogenic shock, HFrEF, secondary to ischemic cardiomyopathy, right lower extremity potation, hypertension, hyperlipidemia, diabetes, cervical myelopathy, who presents today for follow-up.  Presented to [facility] 6/22 for STEMI after syncopal episode, was found by EMS and EKG showed ST elevations.  He underwent aspiration thrombectomy with PTCA of the mid RCA and stenting of the mid left circumflex.  He had transient complete heart block in the setting of his MI which resolved with revascularization.  Echocardiogram revealed LVEF of 40%.  Transient atrial fibrillation with RVR during the PCI.  He then underwent cervical spinal fusion 9/22 and was subsequently admitted with a 3-day history of dysphagia and underwent CT of the chest where there was concerns for hardware migration and adjacent abscess.  He was briefly on Levophed and transferred to [facility] for ENT, neurosurgery, thoracic surgery evaluation.  Due to the size of the cervical abscess and supraglottic compression he was taken for an awake tracheostomy.  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  He tolerated the procedures well without incident.  He was discharged in stable condition and aspirin was restarted on discharge and Brilinta was held for 2 weeks.  He was last seen in clinic 12/05/2022 with continued complaints of worsening dyspnea.  There was some swelling  on occasion that had improved.  Denied any chest pain but was having worse fatigue.  The wound to his left lower leg had finally healed.  He had been trying to increase his activity but was continued to be bothered by the dyspnea he was having.  With concern for anginal equivalent of worsening fatigue and dyspnea he was scheduled for a Lexiscan MPI and an echocardiogram.  Lexiscan revealed no ischemia and he was a no-show for his echocardiogram appointment.  He returns to clinic today  ROS: ***  Studies Reviewed:    EKG:  ***  Lexiscan MPI 12/13/22 Pharmacological myocardial perfusion imaging study with significant GI uptake artifact limiting interpretation Unable to exclude inferior, inferolateral wall, mid to distal anterior wall ischemia given artifact Global hypokinesis, EF estimated at 22% (ejection fraction possibly erroneous in the setting of GI uptake artifact) No EKG changes concerning for ischemia at peak stress or in recovery. Unable to exclude high risk scan, significant GI uptake artifact limiting interpretation.   Consider alternate imaging modality if clinically indicated  Limited TTE 12/03/21 1. Left ventricular ejection fraction, by estimation, is 40 to 45%. Left  ventricular ejection fraction by 2D MOD biplane is 45.1 %. The left  ventricle has mild to moderately decreased function. There is severe  hypokinesis of the left ventricular, entire   inferolateral wall.   2. Right ventricular systolic function is normal. The right ventricular  size is normal.   3. The mitral valve is normal in structure. No evidence of mitral valve  regurgitation.   4. The aortic valve was not well visualized. Aortic valve regurgitation  is not visualized.  Aortic valve sclerosis is present, with no evidence of  aortic valve stenosis.   5. The inferior vena cava is dilated in size with <50% respiratory  variability, suggesting right atrial pressure of 15 mmHg.   Risk  Assessment/Calculations:     No BP recorded.  {Refresh Note OR Click here to enter BP  :1}***        Physical Exam:   VS:  There were no vitals taken for this visit.   Wt Readings from Last 3 Encounters:  12/05/22 228 lb (103.4 kg)  10/31/22 236 lb (107 kg)  07/26/22 238 lb 3.2 oz (108 kg)     GEN: Well nourished, well developed in no acute distress NECK: No JVD; No carotid bruits CARDIAC: ***RRR, no murmurs, rubs, gallops RESPIRATORY:  Clear to auscultation without rales, wheezing or rhonchi  ABDOMEN: Soft, non-tender, non-distended EXTREMITIES:  No edema; No deformity   ASSESSMENT AND PLAN:   ***    {Are you ordering a CV Procedure (e.g. stress test, cath, DCCV, TEE, etc)?   Press F2        :[id number]}   Signed, Jo-Anne Kluth, NP

## 2023-02-06 ENCOUNTER — Ambulatory Visit: Payer: Medicaid Other | Attending: Cardiology | Admitting: Cardiology

## 2023-02-06 ENCOUNTER — Encounter: Payer: Self-pay | Admitting: Cardiology

## 2023-02-06 VITALS — BP 101/64 | HR 78 | Ht 74.0 in | Wt 232.0 lb

## 2023-02-06 DIAGNOSIS — E1169 Type 2 diabetes mellitus with other specified complication: Secondary | ICD-10-CM | POA: Insufficient documentation

## 2023-02-06 DIAGNOSIS — I5022 Chronic systolic (congestive) heart failure: Secondary | ICD-10-CM | POA: Diagnosis not present

## 2023-02-06 DIAGNOSIS — I10 Essential (primary) hypertension: Secondary | ICD-10-CM | POA: Insufficient documentation

## 2023-02-06 DIAGNOSIS — R002 Palpitations: Secondary | ICD-10-CM | POA: Diagnosis present

## 2023-02-06 DIAGNOSIS — Z7984 Long term (current) use of oral hypoglycemic drugs: Secondary | ICD-10-CM

## 2023-02-06 DIAGNOSIS — E785 Hyperlipidemia, unspecified: Secondary | ICD-10-CM

## 2023-02-06 DIAGNOSIS — D649 Anemia, unspecified: Secondary | ICD-10-CM | POA: Diagnosis present

## 2023-02-06 DIAGNOSIS — I2510 Atherosclerotic heart disease of native coronary artery without angina pectoris: Secondary | ICD-10-CM | POA: Diagnosis present

## 2023-02-06 NOTE — Progress Notes (Signed)
Cardiology Office Note:  .   Date:  02/06/2023  ID:  Frank Squibb., DOB 1970-11-13, MRN [id number] PCP: Lindwood Qua, MD  [facility] Providers Cardiologist:  Lance Muss, MD    History of Present Illness: .   [name]. is a 52 y.o. male  with a past medical history of coronary disease status post multiple PCI's with STEMI in 01/2021 due to simultaneous stent thrombosis to CAD as well as occlusion of the left circumflex complicated by cardiogenic shock, HFrEF, secondary to ischemic cardiomyopathy, right lower extremity potation, hypertension, hyperlipidemia, diabetes, cervical myelopathy, who presents today for follow-up.   Presented to [facility] 6/22 for STEMI after syncopal episode, was found by EMS and EKG showed ST elevations.  He underwent aspiration thrombectomy with PTCA of the mid RCA and stenting of the mid left circumflex.  He had transient complete heart block in the setting of his MI which resolved with revascularization.  Echocardiogram revealed LVEF of 40%.  Transient atrial fibrillation with RVR during the PCI.  He then underwent cervical spinal fusion 9/22 and was subsequently admitted with a 3-day history of dysphagia and underwent CT of the chest where there was concerns for hardware migration and adjacent abscess.  He was briefly on Levophed and transferred to [facility] for ENT, neurosurgery, thoracic surgery evaluation.  Due to the size of the cervical abscess and supraglottic compression he was taken for an awake tracheostomy.  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  He tolerated the procedures well without incident.  He was discharged in stable condition and aspirin was restarted on discharge and Brilinta was held for 2 weeks.   He was last seen in clinic 12/05/2022 with continued complaints of worsening dyspnea.  There was some swelling on occasion that had improved.  Denied any chest pain but  was having worse fatigue.  The wound to his left lower leg had finally healed.  He had been trying to increase his activity but was continued to be bothered by the dyspnea he was having.  With concern for anginal equivalent of worsening fatigue and dyspnea he was scheduled for a Lexiscan MPI and an echocardiogram.  Lexiscan revealed no ischemia and he was a no-show for his echocardiogram appointment.   He returns to clinic today stating that overall he has been doing fairly well.  His concern today is that he is having palpitations and elevated heart rates with or without physical therapy and states that his heart rate will increase up to about 120 and then spontaneously drop of late 50 bpm.  That is causing some fatigue with associated shortness of breath.  He denies any chest pain or peripheral edema.  Denies any hospitalizations or visits to the emergency department.  He recently had a Lexiscan Myoview that was completed that had a large quantity of GI uptake which made pictures and difficult to read.  He was scheduled for an echocardiogram at that time unfortunately that study did not get completed.  He is also complaining of GI related symptoms likely IBS.  ROS: 10 point review of systems has been reviewed and considered negative with exception of what is been listed in the HPI  Studies Reviewed: Marland Kitchen      EKG: No new tracings were completed today  Lexiscan MPI 12/13/22 Pharmacological myocardial perfusion imaging study with significant GI uptake artifact limiting interpretation Unable to exclude inferior, inferolateral wall, mid to distal anterior  wall ischemia given artifact Global hypokinesis, EF estimated at 22% (ejection fraction possibly erroneous in the setting of GI uptake artifact) No EKG changes concerning for ischemia at peak stress or in recovery. Unable to exclude high risk scan, significant GI uptake artifact limiting interpretation.   Consider alternate imaging modality if clinically  indicated   Limited TTE 12/03/21 1. Left ventricular ejection fraction, by estimation, is 40 to 45%. Left  ventricular ejection fraction by 2D MOD biplane is 45.1 %. The left  ventricle has mild to moderately decreased function. There is severe  hypokinesis of the left ventricular, entire   inferolateral wall.   2. Right ventricular systolic function is normal. The right ventricular  size is normal.   3. The mitral valve is normal in structure. No evidence of mitral valve  regurgitation.   4. The aortic valve was not well visualized. Aortic valve regurgitation  is not visualized. Aortic valve sclerosis is present, with no evidence of  aortic valve stenosis.   5. The inferior vena cava is dilated in size with <50% respiratory  variability, suggesting right atrial pressure of 15 mmHg.  Risk Assessment/Calculations:             Physical Exam:   VS:  BP 101/64 (BP Location: Left Arm, Patient Position: Sitting, Cuff Size: Normal)   Pulse 78   Ht 6\' 2"  (1.88 m)   Wt 232 lb (105.2 kg)   SpO2 97%   BMI 29.79 kg/m    Wt Readings from Last 3 Encounters:  02/06/23 232 lb (105.2 kg)  12/05/22 228 lb (103.4 kg)  10/31/22 236 lb (107 kg)    GEN: Well nourished, well developed in no acute distress NECK: No JVD; No carotid bruits CARDIAC: RRR, I/VI systolic murmur, without rubs or gallops RESPIRATORY:  Clear to auscultation without rales, wheezing or rhonchi  ABDOMEN: Soft, non-tender, mildly distended EXTREMITIES:  No edema; No deformity   ASSESSMENT AND PLAN: .   Coronary artery disease native coronary artery without angina.  Patient does have exertional dyspnea which is concerning for anginal equivalent.  He did recently undergo Lexiscan Myoview which showed no significant EKG changes concerning for ischemia but a large GI uptake and a depressed EF.  He was scheduled for an echocardiogram to reevaluate actual LVEF.  He is continued on Brilinta 60 mg twice Frank, rosuvastatin 40 mg  Frank,  Chronic HFrEF/ischemic cardiomyopathy with last LVEF 1/23 of 40 to 45%.  He has been scheduled for repeat study as his EF had dropped on his Lexiscan Myoview to reevaluate his LVEF currently is.  He is continued on Jardiance 10 mg Frank, furosemide 40 mg Frank, losartan 12.5 mg Frank, and spironolactone 12.5 mg Frank.  Not currently on beta-blocker therapy.  Will readdress at return after his echo.  Palpitations with spontaneous drops in heart rate noted with or without activity.  He has been placed on a ZIO XT monitor for 7 days to evaluate his concern for palpitations.  Will reevaluate beta-blocker therapy at that time.  Hyperlipidemia associated with type 2 diabetes with an LDL of 85 which is improved from 136.  Continues not to be at goal of 70 on labs.  We are adding Zetia 10 mg Frank to his current medication regimen.  He will need repeat lipid and hepatic panel completed in 10 to 12 weeks.  Essential hypertension with blood pressure today 101/64.  Blood pressures today remain soft.  He is continued on his current medication regimen unchanged.  Blood pressures continue to be monitored by facility.  Chronic anemia with his last hemoglobin 13.70 10/31/2022.  He has been colonoscopy at [facility] but needs to have cardiac workup completed prior to procedure.  Stress testing was unrevealing showed drop in EF.  Patient needs echocardiogram completed to reevaluate heart function prior to colonoscopy.  Cleared to be offered after testing is completed.       Dispo: Patient to return to clinic to see MD/APP in 3 3 months or sooner if needed  Signed, Montey Ebel, NP

## 2023-02-06 NOTE — Patient Instructions (Signed)
Medication Instructions:  Your physician has recommended you make the following change in your medication:   START Ezetimibe (Zetia) 10 mg once daily  *If you need a refill on your cardiac medications before your next appointment, please call your pharmacy*   Lab Work: Lipid & Hepatic panel in 12 weeks. These are fasting labs. Please fax results to [phone]  If you have labs (blood work) drawn today and your tests are completely normal, you will receive your results only by: MyChart Message (if you have MyChart) OR A paper copy in the mail If you have any lab test that is abnormal or we need to change your treatment, we will call you to review the results.   Testing/Procedures: Your physician has requested that you have an echocardiogram. Echocardiography is a painless test that uses sound waves to create images of your heart. It provides your doctor with information about the size and shape of your heart and how well your heart's chambers and valves are working. This procedure takes approximately one hour. There are no restrictions for this procedure. Please do NOT wear cologne, perfume, aftershave, or lotions (deodorant is allowed). Please arrive 15 minutes prior to your appointment time.  Your physician has recommended that you wear a Zio monitor for 7 days.   This monitor is a medical device that records the heart's electrical activity. Doctors most often use these monitors to diagnose arrhythmias. Arrhythmias are problems with the speed or rhythm of the heartbeat. The monitor is a small device applied to your chest. You can wear one while you do your normal daily activities. While wearing this monitor if you have any symptoms to push the button and record what you felt. Once you have worn this monitor for the period of time provider prescribed (Usually 14 days), you will return the monitor device in the postage paid box. Once it is returned they will download the data collected and  provide [place] with a report which the provider will then review and we will call you with those results. Important tips:  Avoid showering during the first 24 hours of wearing the monitor. Avoid excessive sweating to help maximize wear time. Do not submerge the device, no hot tubs, and no swimming pools. Keep any lotions or oils away from the patch. After 24 hours you may shower with the patch on. Take brief showers with your back facing the shower head.  Do not remove patch once it has been placed because that will interrupt data and decrease adhesive wear time. Push the button when you have any symptoms and write down what you were feeling. Once you have completed wearing your monitor, remove and place into box which has postage paid and place in your outgoing mailbox.  If for some reason you have misplaced your box then call our office and we can provide another box and/or mail it off for you.      Follow-Up: At [facility], you and your health needs are our priority.  As part of our continuing mission to provide you with exceptional heart care, we have created designated Provider Care Teams.  These Care Teams include your primary Cardiologist (physician) and Advanced Practice Providers (APPs -  Physician Assistants and Nurse Practitioners) who all work together to provide you with the care you need, when you need it.  We recommend signing up for the patient portal called "MyChart".  Sign up information is provided on this After Visit Summary.  MyChart is used to  connect with patients for Virtual Visits (Telemedicine).  Patients are able to view lab/test results, encounter notes, upcoming appointments, etc.  Non-urgent messages can be sent to your provider as well.   To learn more about what you can do with MyChart, go to [email or web address].    Your next appointment:   6 month(s)  Provider:   You may see Lance Muss, MD or one of the following Advanced Practice Providers  on your designated Care Team:   Nicolasa Ducking, NP Eula Listen, PA-C Cadence Fransico Michael, PA-C Charlsie Quest, NP

## 2023-02-11 ENCOUNTER — Telehealth: Payer: Self-pay | Admitting: Interventional Cardiology

## 2023-02-11 NOTE — Telephone Encounter (Signed)
   Pre-operative Risk Assessment    Patient Name: [name].  DOB: [birth year] MRN: [id number]      Request for Surgical Clearance    Procedure:   right endoscopic carpal tunnel decompression and a right cubital tunnel decompression and possible ulnar transposition   Date of Surgery:  Clearance TBD                                 Surgeon:  Dr Loraine Leriche Abbott Pao Surgeon's Group or Practice Name:  [facility] Phone number:  [phone] Fax number:  [phone]   Type of Clearance Requested:   - Medical    Type of Anesthesia:  Not Indicated   Additional requests/questions:    Courtney Heys   02/11/2023, 10:25 AM

## 2023-02-14 ENCOUNTER — Ambulatory Visit: Payer: Medicaid Other | Admitting: Cardiology

## 2023-02-17 ENCOUNTER — Telehealth: Payer: Self-pay | Admitting: Interventional Cardiology

## 2023-02-17 DIAGNOSIS — R002 Palpitations: Secondary | ICD-10-CM

## 2023-02-17 NOTE — Telephone Encounter (Signed)
Pt was supposed to receive a Zio Monitor and after checking into the issue there was never a order placed for the monitor. Please advise.

## 2023-02-17 NOTE — Telephone Encounter (Signed)
Attempted to leave message but was placed on hold and never received a response to follow up with facility regarding zio.   Orders placed for 7 day zio per Charlsie Quest, NP recommendations at [facility] on 6/20

## 2023-02-18 ENCOUNTER — Ambulatory Visit: Payer: Medicaid Other | Attending: Cardiology

## 2023-02-18 ENCOUNTER — Telehealth: Payer: Self-pay | Admitting: Cardiology

## 2023-02-18 DIAGNOSIS — I5022 Chronic systolic (congestive) heart failure: Secondary | ICD-10-CM | POA: Diagnosis present

## 2023-02-18 LAB — ECHOCARDIOGRAM COMPLETE
Area-P 1/2: 3.42 cm2
S' Lateral: 5.1 cm

## 2023-02-18 MED ORDER — PERFLUTREN LIPID MICROSPHERE
1.0000 mL | INTRAVENOUS | Status: AC | PRN
Start: 2023-02-18 — End: 2023-02-18
  Administered 2023-02-18: 3 mL via INTRAVENOUS

## 2023-02-18 NOTE — Telephone Encounter (Signed)
Notified Melissa that zio monitor was ordered and sent to their facility. Address was confirmed. Melissa stated she was familiar with the monitor and didn't need to go over the instructions. Instructed Melissa that patient is to wear the monitor for 7 days. No further questions noted at this time.

## 2023-02-18 NOTE — Telephone Encounter (Signed)
Pt states that he has not received his holter monitor as of yet. Please call to discuss. Pt requests to contact Stacy at [facility], where pt resides. Ph [phone]. Pt was here for echocardiogram and inquiring.

## 2023-02-18 NOTE — Telephone Encounter (Signed)
Spoke with facility and they have not received heart monitor. Confirmed mailing address for the facility. Advised that he should wear it for 7 days and then mail it back. Reviewed that order was placed yesterday and that it would take 2-4 days for it to arrive.  She verbalized understanding with no further questions at this time.

## 2023-02-24 NOTE — Telephone Encounter (Signed)
Hi [name]!   You saw this patient on 02/06/2023. At that time you ordered an echo due to inconclusive results of his stress test. Echo was performed on 7/2. It also looks like he was complaining of palpitations and you ordered a 7 day zio. That does not look like it was completed. He is pending right endoscopic carpal tunnel decompression and a right cubital tunnel decompression and possible ulnar transposition. Will you please comment on medical clearance? Will he need to complete Zio monitoring first?   Please route your response to P CV DIV Preop. I will communicate with requesting office once you have given recommendations.   Thank you!  Frank Levering, NP

## 2023-02-26 ENCOUNTER — Telehealth: Payer: Self-pay | Admitting: Interventional Cardiology

## 2023-02-26 NOTE — Telephone Encounter (Signed)
Left a message for the patient to call back.  

## 2023-02-26 NOTE — Telephone Encounter (Signed)
Patient is calling to follow up on his heart monitor. Patient stated that he has yet to receive the heart monitor and has been waiting since June for his heart monitor. Patient is wanting to follow up on his Echo results. Please advise.

## 2023-02-26 NOTE — Progress Notes (Signed)
Heart squeeze remains at 40-45%, which is unchanged, study is largely unchanged from previous study in 2022.

## 2023-02-26 NOTE — Telephone Encounter (Signed)
Echocardiogram was largely unchanged from 2022. There is no need to wait on Zio prior to having his procedure. (He is currently at rehab so he may have never received it) He would be considered at acceptable risk for his scheduled procedure from the cardiac  standpoint.

## 2023-02-27 NOTE — Telephone Encounter (Signed)
Attempted to call patient at nursing facility to make aware patient need in office appointment but , was put on hold for 10 mins no one came back to phone to assit with me. Will need to try again

## 2023-02-27 NOTE — Telephone Encounter (Signed)
   Name: [name].  DOB: Aug 20, 1970  MRN: [id number]  Primary Cardiologist: Lance Muss, MD  Chart reviewed as part of pre-operative protocol coverage. Because of Frank Altamura Bouyer [name]'s past medical history and time since last visit, he will require a follow-up in-office visit in order to better assess preoperative cardiovascular risk.  Pre-op covering staff: - Please schedule appointment and call patient to inform them. If patient already had an upcoming appointment within acceptable timeframe, please add "pre-op clearance" to the appointment notes so provider is aware. - Please contact requesting surgeon's office via preferred method (i.e, phone, fax) to inform them of need for appointment prior to surgery.   This patient had a nuclear stress test that was inconclusive. Echo appeared unchanged. He was still having symptoms at last clinic visit. Case discussed with DOD Dr. Rennis Golden who agreed an in office visit to review symptoms and testing would be best.    Marcelino Duster, PA  02/27/2023, 9:41 AM

## 2023-02-28 NOTE — Telephone Encounter (Signed)
Please accept my apology Dr. Damian Leavell for my error.

## 2023-02-28 NOTE — Telephone Encounter (Signed)
I tried to call the pt on [phone] in regard to pre op clearance. We are having trouble reaching the pt . I will update the requesting office.

## 2023-03-04 ENCOUNTER — Other Ambulatory Visit: Payer: Self-pay

## 2023-03-04 ENCOUNTER — Ambulatory Visit: Payer: Medicaid Other | Attending: Cardiology

## 2023-03-04 DIAGNOSIS — R002 Palpitations: Secondary | ICD-10-CM

## 2023-03-04 NOTE — Progress Notes (Signed)
Patient called and stated that he hadn't received ZIO heart monitor from 2 weeks ago. Order placed again for another one after address was verified

## 2023-03-04 NOTE — Telephone Encounter (Signed)
Spoke with staff at [facility] and they stated that the patient asked the echo results be faxed to them and that the ZIO heart monitor be resent to them. Order placed for ZIO after address to facility was verified as follows:  [facility] [address] [place], [place] [address] Phone: [phone] Fax: [phone]

## 2023-03-06 DIAGNOSIS — R002 Palpitations: Secondary | ICD-10-CM

## 2023-03-07 ENCOUNTER — Encounter: Payer: Self-pay | Admitting: Physical Medicine and Rehabilitation

## 2023-03-07 ENCOUNTER — Encounter
Payer: Medicaid Other | Attending: Physical Medicine and Rehabilitation | Admitting: Physical Medicine and Rehabilitation

## 2023-03-07 VITALS — BP 116/71 | HR 71 | Ht 74.0 in | Wt 228.0 lb

## 2023-03-07 DIAGNOSIS — G5623 Lesion of ulnar nerve, bilateral upper limbs: Secondary | ICD-10-CM | POA: Diagnosis present

## 2023-03-07 DIAGNOSIS — Z95818 Presence of other cardiac implants and grafts: Secondary | ICD-10-CM

## 2023-03-07 DIAGNOSIS — G5603 Carpal tunnel syndrome, bilateral upper limbs: Secondary | ICD-10-CM | POA: Diagnosis present

## 2023-03-07 DIAGNOSIS — Z7901 Long term (current) use of anticoagulants: Secondary | ICD-10-CM

## 2023-03-07 DIAGNOSIS — G8254 Quadriplegia, C5-C7 incomplete: Secondary | ICD-10-CM | POA: Diagnosis present

## 2023-03-07 DIAGNOSIS — R252 Cramp and spasm: Secondary | ICD-10-CM | POA: Insufficient documentation

## 2023-03-07 DIAGNOSIS — E119 Type 2 diabetes mellitus without complications: Secondary | ICD-10-CM | POA: Diagnosis not present

## 2023-03-07 DIAGNOSIS — G959 Disease of spinal cord, unspecified: Secondary | ICD-10-CM | POA: Diagnosis present
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
## 2023-03-07 NOTE — Patient Instructions (Signed)
Pt is a 52 yr old male with hx of DM (A1c 8.5 in 5/23), HTN, STEMI s/p PCI with stent placement-- on Eliquis-  EF 40-45% ; S/P C4-T1 surgery for cervical myelopthy - Cervical myelopathy- ASIA D incomplete quadriplegia- 9/22 s/p prevertebral abscess with C4-T1 s/p hardware removal and washouts multiple times in 5/23.  Following with ID. Also has carpal tunnel syndrome and cubital tunnel syndrome B/L - hasn't had surgery yet for wrists or elbows.     I agree that doing surgery might actually help nerve pain, , but there's a chance it might not. A bigger chance it WILL help nerve pain.   2.  I think Lyrica is causing some of the hand swelling, so actually I would decrease Lyrica to 100 mg 3x/day and monitor hand swelling- might require a further reduction in Lyrica- which reducing the Lyrica might reduce swelling, which COULD reduce nerve pain as well.    3. Will try Keppra for nerve pain as wlel since didn't tolerate higher dose of Duloxetine and having swelling, affecting Lyrica dosing.   4. Keppra 250 mg 2x/day- x 1 week, then increase to 500 mg BID-  ~3% of patients can have irritability as a side effect- if he starts to get irritable/more angry, try Vit B complex daily first and that usually works- if that doesn't work to reduce anger issues, will need to wean down, vs stop it as well.   5. Needs to keep Cbg's controlled- esp after surgery, will heal faster/better.    6. When CBG's/blood sugars out of control, it can make nerve pain worse.    7. Surgery next month for Carpal tunnel and cubital tunnel ulnar neuropathy- R side first.  Is contributing to nerve pain.    8. F/U 3 months- double appt- SCI and nerve pain/spasticity

## 2023-03-21 NOTE — Progress Notes (Signed)
Monitor without average heart rate of 79 bpm revealed sinus rhythm with rare early beats, no atrial fibrillation was noted, triggered symptoms associated only with PVCs, no pathologic arrhythmias.  Stable study.  Continue current medication regimen without changes.

## 2023-04-20 HISTORY — PX: OTHER SURGICAL HISTORY: SHX169

## 2023-06-04 ENCOUNTER — Telehealth: Payer: Self-pay | Admitting: Internal Medicine

## 2023-06-04 NOTE — Telephone Encounter (Signed)
Forms placed in providers folder for review.

## 2023-06-04 NOTE — Telephone Encounter (Signed)
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 Forms placed in Dr. Serita Kyle nurse box.

## 2023-06-05 ENCOUNTER — Telehealth: Payer: Self-pay

## 2023-06-05 NOTE — Telephone Encounter (Signed)
Patient is returning call and is requesting return call.

## 2023-06-05 NOTE — Telephone Encounter (Signed)
Called pt 's to schedule appointment for DOT clearance. Left message to call back.

## 2023-06-06 ENCOUNTER — Encounter
Payer: Medicaid Other | Attending: Physical Medicine and Rehabilitation | Admitting: Physical Medicine and Rehabilitation

## 2023-06-06 DIAGNOSIS — G5623 Lesion of ulnar nerve, bilateral upper limbs: Secondary | ICD-10-CM | POA: Insufficient documentation

## 2023-06-06 DIAGNOSIS — G959 Disease of spinal cord, unspecified: Secondary | ICD-10-CM | POA: Insufficient documentation

## 2023-06-06 DIAGNOSIS — R252 Cramp and spasm: Secondary | ICD-10-CM | POA: Insufficient documentation

## 2023-06-06 DIAGNOSIS — G8254 Quadriplegia, C5-C7 incomplete: Secondary | ICD-10-CM | POA: Insufficient documentation

## 2023-06-06 DIAGNOSIS — G5603 Carpal tunnel syndrome, bilateral upper limbs: Secondary | ICD-10-CM | POA: Insufficient documentation

## 2023-06-06 NOTE — Telephone Encounter (Signed)
Pt has an appointment scheduled with Charlsie Quest, NP. DOT forms placed in providers box.

## 2023-06-06 NOTE — Telephone Encounter (Signed)
Left a message for the patient to call back.  

## 2023-06-06 NOTE — Telephone Encounter (Signed)
Appointment scheduled for 06/13/23 with [name]

## 2023-06-06 NOTE — Telephone Encounter (Signed)
Patient returned RN's call. 

## 2023-06-12 NOTE — Progress Notes (Signed)
Cardiology Office Note    Date:  06/13/2023  ID:  [name]., DOB 12/03/1970, MRN [id number] PCP:  Lindwood Qua, MD  Cardiologist:  Yvonne Kendall, MD  Electrophysiologist:  None   Chief Complaint: Follow up for CAD.   History of Present Illness: .    [name]. is a 52 y.o. male with visit-pertinent history of coronary artery disease s/p multiple PCI's with STEMI in 01/2021 due to simultaneous stent thrombosis to CAD as well as occlusion of the left circumflex complicated by cardiogenic shock, HFrEF, secondary to ischemic cardiomyopathy, hypertension, hyperlipidemia, diabetes, cervical myelopathy. He presents today for follow up.   In 01/2021 he presented to [facility] for STEMI after syncopal episode, was found by EMS and EKG showed ST elevations.  He underwent aspiration thrombectomy with PTCA of the mid RCA and stenting of the mid left circumflex.  He had transient complete heart block in the setting of his MI which resolved with revascularization.  Echocardiogram revealed LVEF of 40%.  Transient atrial fibrillation with RVR during PCI.  He then underwent cervical spinal fusion in 04/2021 and was subsequently admitted with a 3-day history of dysphagia and underwent CT of the chest where there was concerns for hardware migration and adjacent abscess.  He was briefly on Levophed and [facility], neurosurgery, thoracic surgery evaluation.  Due to the size of the cervical abscess and supraglottic compression he was taken for an awake tracheostomy.  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  He tolerated the procedures well without incident.  He was discharged in stable condition and aspirin was restarted on discharge and Brilinta was held for 2 weeks.  He was seen in clinic on 12/05/2022 with continued complaints of worsening dyspnea, he noted some intermittent swelling that had improved.  Denied  any chest pain but was having worsening fatigue.  With concern for anginal equivalent of worsening fatigue and dyspnea he was scheduled for Lexiscan MPI and echocardiogram. He returned to clinic on 02/06/2023 reporting that he been doing fairly well.  He noted concern for palpitations and elevated heart rates with or without physical activity.  He noted his heart rate would increase to about 120 bpm and spontaneously dropped to 50 bpm.  He had a Lexiscan Myoview that was completed that had a large quantity of GI uptake which made pictures call to read.  He was scheduled for an echocardiogram which indicated LVEF of 40 to 45%, LV with mildly decreased function, inferior wall hypokinesis, diastolic parameters were indeterminate. Aortic valve sclerosis was present with no evidence of stenosis.  Cardiac monitor indicated average heart rate of 79 bpm ranging from 63 to 120 bpm.  Predominant underlying rhythm was sinus rhythm, isolated SVE were rare, SVE couplets were rare, symptoms were associated with PVCs.  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 He reports he is doing well. He denies any further palpitations, chest pain, shortness of breath.  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  He has been walking with a walker, is trying to increase his exercise.  Reports he continues to smoke half pack a day, he is not interested in cessation at this time.  Labwork independently reviewed: 10/31/22: Sodium 138, potassium 41, creatinine 1.05, AST 74, ALT 63  ROS: .   Today he denies chest pain, shortness of  Cardiology Office Note    Date:  06/13/2023  ID:  [name]., DOB 12/03/1970, MRN [id number] PCP:  Lindwood Qua, MD  Cardiologist:  Yvonne Kendall, MD  Electrophysiologist:  None   Chief Complaint: Follow up for CAD.   History of Present Illness: .    [name]. is a 52 y.o. male with visit-pertinent history of coronary artery disease s/p multiple PCI's with STEMI in 01/2021 due to simultaneous stent thrombosis to CAD as well as occlusion of the left circumflex complicated by cardiogenic shock, HFrEF, secondary to ischemic cardiomyopathy, hypertension, hyperlipidemia, diabetes, cervical myelopathy. He presents today for follow up.   In 01/2021 he presented to [facility] for STEMI after syncopal episode, was found by EMS and EKG showed ST elevations.  He underwent aspiration thrombectomy with PTCA of the mid RCA and stenting of the mid left circumflex.  He had transient complete heart block in the setting of his MI which resolved with revascularization.  Echocardiogram revealed LVEF of 40%.  Transient atrial fibrillation with RVR during PCI.  He then underwent cervical spinal fusion in 04/2021 and was subsequently admitted with a 3-day history of dysphagia and underwent CT of the chest where there was concerns for hardware migration and adjacent abscess.  He was briefly on Levophed and [facility], neurosurgery, thoracic surgery evaluation.  Due to the size of the cervical abscess and supraglottic compression he was taken for an awake tracheostomy.  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  He tolerated the procedures well without incident.  He was discharged in stable condition and aspirin was restarted on discharge and Brilinta was held for 2 weeks.  He was seen in clinic on 12/05/2022 with continued complaints of worsening dyspnea, he noted some intermittent swelling that had improved.  Denied  any chest pain but was having worsening fatigue.  With concern for anginal equivalent of worsening fatigue and dyspnea he was scheduled for Lexiscan MPI and echocardiogram. He returned to clinic on 02/06/2023 reporting that he been doing fairly well.  He noted concern for palpitations and elevated heart rates with or without physical activity.  He noted his heart rate would increase to about 120 bpm and spontaneously dropped to 50 bpm.  He had a Lexiscan Myoview that was completed that had a large quantity of GI uptake which made pictures call to read.  He was scheduled for an echocardiogram which indicated LVEF of 40 to 45%, LV with mildly decreased function, inferior wall hypokinesis, diastolic parameters were indeterminate. Aortic valve sclerosis was present with no evidence of stenosis.  Cardiac monitor indicated average heart rate of 79 bpm ranging from 63 to 120 bpm.  Predominant underlying rhythm was sinus rhythm, isolated SVE were rare, SVE couplets were rare, symptoms were associated with PVCs.  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 He reports he is doing well. He denies any further palpitations, chest pain, shortness of breath.  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  He has been walking with a walker, is trying to increase his exercise.  Reports he continues to smoke half pack a day, he is not interested in cessation at this time.  Labwork independently reviewed: 10/31/22: Sodium 138, potassium 41, creatinine 1.05, AST 74, ALT 63  ROS: .   Today he denies chest pain, shortness of  Cardiology Office Note    Date:  06/13/2023  ID:  [name]., DOB 12/03/1970, MRN [id number] PCP:  Lindwood Qua, MD  Cardiologist:  Yvonne Kendall, MD  Electrophysiologist:  None   Chief Complaint: Follow up for CAD.   History of Present Illness: .    [name]. is a 52 y.o. male with visit-pertinent history of coronary artery disease s/p multiple PCI's with STEMI in 01/2021 due to simultaneous stent thrombosis to CAD as well as occlusion of the left circumflex complicated by cardiogenic shock, HFrEF, secondary to ischemic cardiomyopathy, hypertension, hyperlipidemia, diabetes, cervical myelopathy. He presents today for follow up.   In 01/2021 he presented to [facility] for STEMI after syncopal episode, was found by EMS and EKG showed ST elevations.  He underwent aspiration thrombectomy with PTCA of the mid RCA and stenting of the mid left circumflex.  He had transient complete heart block in the setting of his MI which resolved with revascularization.  Echocardiogram revealed LVEF of 40%.  Transient atrial fibrillation with RVR during PCI.  He then underwent cervical spinal fusion in 04/2021 and was subsequently admitted with a 3-day history of dysphagia and underwent CT of the chest where there was concerns for hardware migration and adjacent abscess.  He was briefly on Levophed and [facility], neurosurgery, thoracic surgery evaluation.  Due to the size of the cervical abscess and supraglottic compression he was taken for an awake tracheostomy.  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  He tolerated the procedures well without incident.  He was discharged in stable condition and aspirin was restarted on discharge and Brilinta was held for 2 weeks.  He was seen in clinic on 12/05/2022 with continued complaints of worsening dyspnea, he noted some intermittent swelling that had improved.  Denied  any chest pain but was having worsening fatigue.  With concern for anginal equivalent of worsening fatigue and dyspnea he was scheduled for Lexiscan MPI and echocardiogram. He returned to clinic on 02/06/2023 reporting that he been doing fairly well.  He noted concern for palpitations and elevated heart rates with or without physical activity.  He noted his heart rate would increase to about 120 bpm and spontaneously dropped to 50 bpm.  He had a Lexiscan Myoview that was completed that had a large quantity of GI uptake which made pictures call to read.  He was scheduled for an echocardiogram which indicated LVEF of 40 to 45%, LV with mildly decreased function, inferior wall hypokinesis, diastolic parameters were indeterminate. Aortic valve sclerosis was present with no evidence of stenosis.  Cardiac monitor indicated average heart rate of 79 bpm ranging from 63 to 120 bpm.  Predominant underlying rhythm was sinus rhythm, isolated SVE were rare, SVE couplets were rare, symptoms were associated with PVCs.  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 He reports he is doing well. He denies any further palpitations, chest pain, shortness of breath.  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  He has been walking with a walker, is trying to increase his exercise.  Reports he continues to smoke half pack a day, he is not interested in cessation at this time.  Labwork independently reviewed: 10/31/22: Sodium 138, potassium 41, creatinine 1.05, AST 74, ALT 63  ROS: .   Today he denies chest pain, shortness of  given artifact Global hypokinesis, EF estimated at 22% (ejection fraction possibly erroneous in the setting of GI uptake artifact) No EKG changes concerning for ischemia at peak stress or in recovery. Unable to exclude high risk scan, significant GI uptake artifact limiting interpretation. Consider alternate imaging  modality if clinically indicated   Signed, Dossie Arbour, MD, Ph.D [facility]   ECHOCARDIOGRAM  ECHOCARDIOGRAM COMPLETE 02/18/2023  Narrative ECHOCARDIOGRAM REPORT    Patient Name:   Charvis Gholar. Date of Exam: 02/18/2023 Medical Rec #:  [id number]           Height:       74.0 in Accession #:    [id number]          Weight:       232.0 lb Date of Birth:  1971-05-16           BSA:          2.315 m Patient Age:    52 years            BP:           101/64 mmHg Patient Gender: M                   HR:           79 bpm. Exam Location:  [facility]  Procedure: 2D Echo, Cardiac Doppler, Color Doppler and Intracardiac Opacification Agent  Indications:    I50.9* Heart failure (unspecified)  History:        Patient has prior history of Echocardiogram examinations. CHF and Cardiomyopathy, CAD, Signs/Symptoms:Syncope and Chest Pain; Risk Factors:Hypertension, Diabetes, Current Smoker and Dyslipidemia.  Sonographer:    Ilda Mori RDCS, MHA Referring Phys: [phone] SHERI HAMMOCK   Sonographer Comments: Suboptimal apical window. Image acquisition challenging due to patient body habitus. IMPRESSIONS   1. Left ventricular ejection fraction, by estimation, is 40 to 45%. The left ventricle has mildly decreased function. The left ventricle demonstrates regional wall motion abnormalities (inferior wall hypokinesis). Left ventricular diastolic parameters are indeterminate. 2. Right ventricular systolic function is normal. The right ventricular size is normal. Tricuspid regurgitation signal is inadequate for assessing PA pressure. 3. The mitral valve is normal in structure. No evidence of mitral valve regurgitation. No evidence of mitral stenosis. 4. The aortic valve has an indeterminant number of cusps. There is mild calcification of the aortic valve. Aortic valve regurgitation is not visualized. Aortic valve sclerosis is present, with no evidence of aortic valve stenosis. 5. The inferior  vena cava is normal in size with greater than 50% respiratory variability, suggesting right atrial pressure of 3 mmHg.  FINDINGS Left Ventricle: Left ventricular ejection fraction, by estimation, is 40 to 45%. The left ventricle has mildly decreased function. The left ventricle demonstrates regional wall motion abnormalities. Definity contrast agent was given IV to delineate the left ventricular endocardial borders. The left ventricular internal cavity size was normal in size. There is no left ventricular hypertrophy. Left ventricular diastolic parameters are indeterminate.  Right Ventricle: The right ventricular size is normal. No increase in right ventricular wall thickness. Right ventricular systolic function is normal. Tricuspid regurgitation signal is inadequate for assessing PA pressure.  Left Atrium: Left atrial size was normal in size.  Right Atrium: Right atrial size was normal in size.  Pericardium: There is no evidence of pericardial effusion.  Mitral Valve: The mitral valve is normal in structure. No evidence of mitral valve regurgitation. No evidence of mitral valve stenosis.  Tricuspid Valve: The tricuspid  given artifact Global hypokinesis, EF estimated at 22% (ejection fraction possibly erroneous in the setting of GI uptake artifact) No EKG changes concerning for ischemia at peak stress or in recovery. Unable to exclude high risk scan, significant GI uptake artifact limiting interpretation. Consider alternate imaging  modality if clinically indicated   Signed, Dossie Arbour, MD, Ph.D [facility]   ECHOCARDIOGRAM  ECHOCARDIOGRAM COMPLETE 02/18/2023  Narrative ECHOCARDIOGRAM REPORT    Patient Name:   Charvis Gholar. Date of Exam: 02/18/2023 Medical Rec #:  [id number]           Height:       74.0 in Accession #:    [id number]          Weight:       232.0 lb Date of Birth:  1971-05-16           BSA:          2.315 m Patient Age:    52 years            BP:           101/64 mmHg Patient Gender: M                   HR:           79 bpm. Exam Location:  [facility]  Procedure: 2D Echo, Cardiac Doppler, Color Doppler and Intracardiac Opacification Agent  Indications:    I50.9* Heart failure (unspecified)  History:        Patient has prior history of Echocardiogram examinations. CHF and Cardiomyopathy, CAD, Signs/Symptoms:Syncope and Chest Pain; Risk Factors:Hypertension, Diabetes, Current Smoker and Dyslipidemia.  Sonographer:    Ilda Mori RDCS, MHA Referring Phys: [phone] SHERI HAMMOCK   Sonographer Comments: Suboptimal apical window. Image acquisition challenging due to patient body habitus. IMPRESSIONS   1. Left ventricular ejection fraction, by estimation, is 40 to 45%. The left ventricle has mildly decreased function. The left ventricle demonstrates regional wall motion abnormalities (inferior wall hypokinesis). Left ventricular diastolic parameters are indeterminate. 2. Right ventricular systolic function is normal. The right ventricular size is normal. Tricuspid regurgitation signal is inadequate for assessing PA pressure. 3. The mitral valve is normal in structure. No evidence of mitral valve regurgitation. No evidence of mitral stenosis. 4. The aortic valve has an indeterminant number of cusps. There is mild calcification of the aortic valve. Aortic valve regurgitation is not visualized. Aortic valve sclerosis is present, with no evidence of aortic valve stenosis. 5. The inferior  vena cava is normal in size with greater than 50% respiratory variability, suggesting right atrial pressure of 3 mmHg.  FINDINGS Left Ventricle: Left ventricular ejection fraction, by estimation, is 40 to 45%. The left ventricle has mildly decreased function. The left ventricle demonstrates regional wall motion abnormalities. Definity contrast agent was given IV to delineate the left ventricular endocardial borders. The left ventricular internal cavity size was normal in size. There is no left ventricular hypertrophy. Left ventricular diastolic parameters are indeterminate.  Right Ventricle: The right ventricular size is normal. No increase in right ventricular wall thickness. Right ventricular systolic function is normal. Tricuspid regurgitation signal is inadequate for assessing PA pressure.  Left Atrium: Left atrial size was normal in size.  Right Atrium: Right atrial size was normal in size.  Pericardium: There is no evidence of pericardial effusion.  Mitral Valve: The mitral valve is normal in structure. No evidence of mitral valve regurgitation. No evidence of mitral valve stenosis.  Tricuspid Valve: The tricuspid  Cardiology Office Note    Date:  06/13/2023  ID:  [name]., DOB 12/03/1970, MRN [id number] PCP:  Lindwood Qua, MD  Cardiologist:  Yvonne Kendall, MD  Electrophysiologist:  None   Chief Complaint: Follow up for CAD.   History of Present Illness: .    [name]. is a 52 y.o. male with visit-pertinent history of coronary artery disease s/p multiple PCI's with STEMI in 01/2021 due to simultaneous stent thrombosis to CAD as well as occlusion of the left circumflex complicated by cardiogenic shock, HFrEF, secondary to ischemic cardiomyopathy, hypertension, hyperlipidemia, diabetes, cervical myelopathy. He presents today for follow up.   In 01/2021 he presented to [facility] for STEMI after syncopal episode, was found by EMS and EKG showed ST elevations.  He underwent aspiration thrombectomy with PTCA of the mid RCA and stenting of the mid left circumflex.  He had transient complete heart block in the setting of his MI which resolved with revascularization.  Echocardiogram revealed LVEF of 40%.  Transient atrial fibrillation with RVR during PCI.  He then underwent cervical spinal fusion in 04/2021 and was subsequently admitted with a 3-day history of dysphagia and underwent CT of the chest where there was concerns for hardware migration and adjacent abscess.  He was briefly on Levophed and [facility], neurosurgery, thoracic surgery evaluation.  Due to the size of the cervical abscess and supraglottic compression he was taken for an awake tracheostomy.  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  He tolerated the procedures well without incident.  He was discharged in stable condition and aspirin was restarted on discharge and Brilinta was held for 2 weeks.  He was seen in clinic on 12/05/2022 with continued complaints of worsening dyspnea, he noted some intermittent swelling that had improved.  Denied  any chest pain but was having worsening fatigue.  With concern for anginal equivalent of worsening fatigue and dyspnea he was scheduled for Lexiscan MPI and echocardiogram. He returned to clinic on 02/06/2023 reporting that he been doing fairly well.  He noted concern for palpitations and elevated heart rates with or without physical activity.  He noted his heart rate would increase to about 120 bpm and spontaneously dropped to 50 bpm.  He had a Lexiscan Myoview that was completed that had a large quantity of GI uptake which made pictures call to read.  He was scheduled for an echocardiogram which indicated LVEF of 40 to 45%, LV with mildly decreased function, inferior wall hypokinesis, diastolic parameters were indeterminate. Aortic valve sclerosis was present with no evidence of stenosis.  Cardiac monitor indicated average heart rate of 79 bpm ranging from 63 to 120 bpm.  Predominant underlying rhythm was sinus rhythm, isolated SVE were rare, SVE couplets were rare, symptoms were associated with PVCs.  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 He reports he is doing well. He denies any further palpitations, chest pain, shortness of breath.  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  He has been walking with a walker, is trying to increase his exercise.  Reports he continues to smoke half pack a day, he is not interested in cessation at this time.  Labwork independently reviewed: 10/31/22: Sodium 138, potassium 41, creatinine 1.05, AST 74, ALT 63  ROS: .   Today he denies chest pain, shortness of

## 2023-06-13 ENCOUNTER — Ambulatory Visit: Payer: Medicaid Other | Attending: Cardiology | Admitting: Cardiology

## 2023-06-13 ENCOUNTER — Encounter: Payer: Self-pay | Admitting: Cardiology

## 2023-06-13 VITALS — BP 107/65 | HR 82 | Ht 74.0 in | Wt 215.6 lb

## 2023-06-13 DIAGNOSIS — R002 Palpitations: Secondary | ICD-10-CM | POA: Diagnosis present

## 2023-06-13 DIAGNOSIS — I2510 Atherosclerotic heart disease of native coronary artery without angina pectoris: Secondary | ICD-10-CM | POA: Diagnosis present

## 2023-06-13 DIAGNOSIS — E1169 Type 2 diabetes mellitus with other specified complication: Secondary | ICD-10-CM | POA: Insufficient documentation

## 2023-06-13 DIAGNOSIS — I5022 Chronic systolic (congestive) heart failure: Secondary | ICD-10-CM | POA: Insufficient documentation

## 2023-06-13 DIAGNOSIS — E785 Hyperlipidemia, unspecified: Secondary | ICD-10-CM | POA: Insufficient documentation

## 2023-06-13 DIAGNOSIS — I10 Essential (primary) hypertension: Secondary | ICD-10-CM | POA: Insufficient documentation

## 2023-06-13 DIAGNOSIS — D649 Anemia, unspecified: Secondary | ICD-10-CM | POA: Insufficient documentation

## 2023-06-13 NOTE — Patient Instructions (Signed)
Medication Instructions:  Your physician recommends that you continue on your current medications as directed. Please refer to the Current Medication list given to you today.  *If you need a refill on your cardiac medications before your next appointment, please call your pharmacy*  Lab Work: Your provider would like for you to have following labs drawn today direct LDL & CMET.   If you have labs (blood work) drawn today and your tests are completely normal, you will receive your results only by: MyChart Message (if you have MyChart) OR A paper copy in the mail If you have any lab test that is abnormal or we need to change your treatment, we will call you to review the results.  Testing/Procedures: - None ordered  Follow-Up: At [facility], you and your health needs are our priority.  As part of our continuing mission to provide you with exceptional heart care, we have created designated Provider Care Teams.  These Care Teams include your primary Cardiologist (physician) and Advanced Practice Providers (APPs -  Physician Assistants and Nurse Practitioners) who all work together to provide you with the care you need, when you need it.  Your next appointment:   6 month(s)  Provider:   Charlsie Quest, NP    Other Instructions - None

## 2023-06-14 LAB — COMPREHENSIVE METABOLIC PANEL
ALT: 40 [IU]/L (ref 0–44)
AST: 37 [IU]/L (ref 0–40)
Albumin: 4.2 g/dL (ref 3.8–4.9)
Alkaline Phosphatase: 192 [IU]/L — ABNORMAL HIGH (ref 44–121)
BUN/Creatinine Ratio: 16 (ref 9–20)
BUN: 24 mg/dL (ref 6–24)
Bilirubin Total: 0.6 mg/dL (ref 0.0–1.2)
CO2: 27 mmol/L (ref 20–29)
Calcium: 9.5 mg/dL (ref 8.7–10.2)
Chloride: 95 mmol/L — ABNORMAL LOW (ref 96–106)
Creatinine, Ser: 1.52 mg/dL — ABNORMAL HIGH (ref 0.76–1.27)
Globulin, Total: 3.1 g/dL (ref 1.5–4.5)
Glucose: 174 mg/dL — ABNORMAL HIGH (ref 70–99)
Potassium: 3.8 mmol/L (ref 3.5–5.2)
Sodium: 138 mmol/L (ref 134–144)
Total Protein: 7.3 g/dL (ref 6.0–8.5)
eGFR: 55 mL/min/{1.73_m2} — ABNORMAL LOW (ref >59)

## 2023-06-14 LAB — LDL CHOLESTEROL, DIRECT: LDL Direct: 21 mg/dL (ref 0–99)

## 2023-06-17 ENCOUNTER — Other Ambulatory Visit: Payer: Self-pay

## 2023-06-17 DIAGNOSIS — I5022 Chronic systolic (congestive) heart failure: Secondary | ICD-10-CM

## 2023-06-17 MED ORDER — FUROSEMIDE 40 MG PO TABS: 40.0000 mg | ORAL_TABLET | Freq: Every day | ORAL | Status: AC | PRN

## 2023-06-18 ENCOUNTER — Other Ambulatory Visit: Payer: Self-pay

## 2023-06-23 ENCOUNTER — Telehealth: Payer: Self-pay | Admitting: Cardiology

## 2023-06-23 NOTE — Telephone Encounter (Signed)
Spoke with patient and his wife. He needs another letter because they lost the one he was given at last office visit. New letter completed and signed by provider here in the office. He will come by to pick that up today. No further needs at this time.

## 2023-06-23 NOTE — Telephone Encounter (Signed)
Pt called in stating he lost his DOT letter given by Shelva Majestic, NP at last appt and he needs it prior to tomorrow. She wants to know if another can be printed out and he pick it up today. Please advise.

## 2023-07-02 ENCOUNTER — Ambulatory Visit: Payer: Self-pay | Admitting: Family Medicine

## 2023-08-05 ENCOUNTER — Ambulatory Visit: Payer: Medicaid Other | Admitting: Cardiology

## 2023-12-16 ENCOUNTER — Ambulatory Visit: Payer: Medicaid Other | Attending: Cardiology | Admitting: Cardiology

## 2023-12-16 NOTE — Progress Notes (Deleted)
 Cardiology Office Note:  .   Date:  12/16/2023  ID:  Frank Redman., DOB 07-01-1971, MRN [id number] PCP: Darylene Epley, MD  [facility] Providers Cardiologist:  Sammy Crisp, MD { Click to update primary MD,subspecialty MD or APP then REFRESH:1}   History of Present Illness: .   Frank Demory. is a 53 y.o. male with a past medical history of coronary artery disease status post multiple PCI's with STEMI (01/2021) due to stent thrombosis as well as occlusion of the left circumflex complicated by cardiogenic shock, HFrEF, ischemic cardiomyopathy, hypertension, hyperlipidemia, type 2 diabetes, cervical myelopathy, tobacco abuse, who is here today to follow-up on his coronary artery disease.   Presented to [facility] 6/22 for STEMI after syncopal episode, was found by EMS and EKG showed ST elevations.  He underwent aspiration thrombectomy with PTCA of the mid RCA and stenting of the mid left circumflex.  He had transient complete heart block in the setting of his MI which resolved with revascularization.  Echocardiogram revealed LVEF of 40%.  Transient atrial fibrillation with RVR during the PCI.  He then underwent cervical spinal fusion 9/22 and was subsequently admitted with a 3-day history of dysphagia and underwent CT of the chest where there was concerns for hardware migration and adjacent abscess.  He was briefly on Levophed  and transferred to [facility] for ENT, neurosurgery, thoracic surgery evaluation.  Due to the size of the cervical abscess and supraglottic compression he was taken for an awake tracheostomy.  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  He tolerated the procedures well without incident.  He was discharged in stable condition and aspirin  was restarted on discharge and Brilinta  was held for 2 weeks.  He was seen in clinic in April 2024 with complaints of worsening dyspnea.  The wound was left lower leg and  finally healed.  There was concern for an anginal equivalent worsening fatigue and dyspnea he was scheduled for Lexiscan  Myoview  and an echocardiogram.  Lexiscan  revealed no ischemia and he was no-show for his echocardiogram appointment.   He was last seen in clinic 06/13/2023.  He reported he was doing well with no further complaints.  There were no medication changes that were made.  He was sent for updated labs.  He returns to clinic today  ROS: 10 point review of systems has been reviewed and considered negative with exception was been listed in the HPI  Studies Reviewed: .        Lexiscan  MPI 12/13/22 Pharmacological myocardial perfusion imaging study with significant GI uptake artifact limiting interpretation Unable to exclude inferior, inferolateral wall, mid to distal anterior wall ischemia given artifact Global hypokinesis, EF estimated at 22% (ejection fraction possibly erroneous in the setting of GI uptake artifact) No EKG changes concerning for ischemia at peak stress or in recovery. Unable to exclude high risk scan, significant GI uptake artifact limiting interpretation.   Consider alternate imaging modality if clinically indicated   Limited TTE 12/03/21 1. Left ventricular ejection fraction, by estimation, is 40 to 45%. Left  ventricular ejection fraction by 2D MOD biplane is 45.1 %. The left  ventricle has mild to moderately decreased function. There is severe  hypokinesis of the left ventricular, entire   inferolateral wall.   2. Right ventricular systolic function is normal. The right ventricular  size is normal.   3. The mitral valve is normal in structure. No evidence of mitral valve  regurgitation.   4. The aortic valve was not well visualized. Aortic valve regurgitation  is not visualized. Aortic valve sclerosis is present, with no evidence of  aortic valve stenosis.   5. The inferior vena cava is dilated in size with <50% respiratory  variability, suggesting right  atrial pressure of 15 mmHg.  Risk Assessment/Calculations:     No BP recorded.  {Refresh Note OR Click here to enter BP  :1}***       Physical Exam:   VS:  There were no vitals taken for this visit.   Wt Readings from Last 3 Encounters:  06/13/23 215 lb 9.6 oz (97.8 kg)  03/07/23 228 lb (103.4 kg)  02/06/23 232 lb (105.2 kg)    GEN: Well nourished, well developed in no acute distress NECK: No JVD; No carotid bruits CARDIAC: ***RRR, I/VI systolic murmurs, rubs, gallops RESPIRATORY:  Clear to auscultation without rales, wheezing or rhonchi  ABDOMEN: Soft, non-tender, non-distended EXTREMITIES:  No edema; No deformity   ASSESSMENT AND PLAN: .   Coronary artery disease of native coronary artery without angina Chronic HFrEF/ischemic cardiomyopathy Palpitations Mixed hyperlipidemia with associated type 2 diabetes Primary hypertension Chronic anemia    {Are you ordering a CV Procedure (e.g. stress test, cath, DCCV, TEE, etc)?   Press F2        :[id number]}  Dispo: ***  Signed, Frank Erby, NP

## 2023-12-23 ENCOUNTER — Other Ambulatory Visit: Payer: Self-pay | Admitting: Orthopedic Surgery

## 2023-12-23 DIAGNOSIS — S46011A Strain of muscle(s) and tendon(s) of the rotator cuff of right shoulder, initial encounter: Secondary | ICD-10-CM

## 2023-12-24 ENCOUNTER — Encounter: Payer: Self-pay | Admitting: Orthopedic Surgery

## 2023-12-27 ENCOUNTER — Ambulatory Visit
Admission: RE | Admit: 2023-12-27 | Discharge: 2023-12-27 | Disposition: A | Discharge status: Home / Self Care | Source: Ambulatory Visit | Attending: Orthopedic Surgery | Admitting: Orthopedic Surgery

## 2023-12-27 DIAGNOSIS — S46011A Strain of muscle(s) and tendon(s) of the rotator cuff of right shoulder, initial encounter: Secondary | ICD-10-CM

## 2024-01-02 ENCOUNTER — Other Ambulatory Visit: Payer: Self-pay | Admitting: *Deleted

## 2024-01-02 DIAGNOSIS — R002 Palpitations: Secondary | ICD-10-CM

## 2024-01-15 ENCOUNTER — Ambulatory Visit: Attending: Cardiology | Admitting: Cardiology

## 2024-01-15 ENCOUNTER — Encounter: Payer: Self-pay | Admitting: Cardiology

## 2024-01-15 VITALS — BP 113/67 | HR 88 | Ht 74.0 in | Wt 207.2 lb

## 2024-01-15 DIAGNOSIS — I10 Essential (primary) hypertension: Secondary | ICD-10-CM | POA: Insufficient documentation

## 2024-01-15 DIAGNOSIS — E785 Hyperlipidemia, unspecified: Secondary | ICD-10-CM | POA: Insufficient documentation

## 2024-01-15 DIAGNOSIS — I2510 Atherosclerotic heart disease of native coronary artery without angina pectoris: Secondary | ICD-10-CM | POA: Insufficient documentation

## 2024-01-15 DIAGNOSIS — I5022 Chronic systolic (congestive) heart failure: Secondary | ICD-10-CM | POA: Diagnosis present

## 2024-01-15 DIAGNOSIS — E1169 Type 2 diabetes mellitus with other specified complication: Secondary | ICD-10-CM | POA: Diagnosis present

## 2024-01-15 DIAGNOSIS — I255 Ischemic cardiomyopathy: Secondary | ICD-10-CM | POA: Diagnosis present

## 2024-01-15 DIAGNOSIS — R002 Palpitations: Secondary | ICD-10-CM | POA: Insufficient documentation

## 2024-01-15 DIAGNOSIS — Z720 Tobacco use: Secondary | ICD-10-CM | POA: Insufficient documentation

## 2024-01-15 MED ORDER — CLOPIDOGREL BISULFATE 75 MG PO TABS
300.0000 mg | ORAL_TABLET | Freq: Once | ORAL | 0 refills | Status: AC
Start: 2024-01-15 — End: 2024-01-15

## 2024-01-15 MED ORDER — CLOPIDOGREL BISULFATE 75 MG PO TABS: 75.0000 mg | ORAL_TABLET | Freq: Every day | ORAL | 3 refills | Status: AC

## 2024-01-15 NOTE — Progress Notes (Signed)
 Cardiology Office Note:  .   Date:  01/15/2024  ID:  [name]., DOB [birth year], MRN [id number] PCP: Darylene Epley, MD  [facility] Providers Cardiologist:  None    History of Present Illness: .   [name]. is a 53 y.o. male with a past medical history of coronary artery disease status post multiple PCI's with a STEMI (01/2021) due to simultaneous late stent thrombosis in the mid RCA as well as occlusion of the left circumflex complicated by cardiogenic shock, HFrEF due to ischemic cardiomyopathy, right lower extremity amputation, primary hypertension, mixed hyperlipidemia, type 2 diabetes, cervical myelopathy, who presents today for follow-up of his coronary artery disease.   Presented to [facility] 6/22 for STEMI after syncopal episode, was found by EMS and EKG showed ST elevations. He underwent aspiration thrombectomy with PTCA of the mid RCA and stenting of the mid left circumflex. He had transient complete heart block in the setting of his MI which resolved with revascularization. Echocardiogram revealed LVEF of 40%. Transient atrial fibrillation with RVR during the PCI. He then underwent cervical spinal fusion 9/22 and was subsequently admitted with a 3-day history of dysphagia and underwent CT of the chest where there was concerns for hardware migration and adjacent abscess. He was briefly on Levophed  and transferred to [facility] for ENT, neurosurgery, thoracic surgery evaluation. Due to the size of the cervical abscess and supraglottic compression he was taken for an awake tracheostomy. 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 He tolerated the procedures well without incident. He was discharged in stable condition and aspirin  was restarted on discharge and Brilinta  was held for 2 weeks.   Was evaluated in clinic 12/05/2022 with continued complaints of worsening dyspnea.  He was scheduled for Lexiscan  Myoview  which  revealed no ischemia and he was a no-show for his echocardiogram appointment.  He was then seen in clinic 02/06/2023 overall was doing fairly well from a cardiac perspective.  He stated he been having palpitations with elevated heart rates with or without physical therapy up to 520 bpm and spontaneously dropped to 50 bpm.  He was placed on a ZIO XT monitor which revealed normal sinus rhythm with rare PACs and PVCs, no atrial fibrillation and no pathologic arrhythmias.  Echocardiogram as well and completed which revealed LVEF of 40-45% and largely unchanged from prior study.   He was last seen in clinic 06/13/2023.  At that time he stated he was doing well with no further episodes of palpitations, chest pain or shortness of breath.  He reports he had been discharged from [facility] who is now renting a room from his friend.  He had been walking with a walker and trying to increase his exercise.  Unfortunately reported he continued to smoke approximately half a pack per day.  There were no medication changes that were made was scheduled for some repeat labs.  He returns to clinic today stating that overall he has been doing fairly well from a cardiac perspective.  He does note that there was a mixup with the pharmacy and he has been out of his Brilinta  for approximately 6 days.  He states he has an occasional muscle cramp to the left side of his chest along his rib cage but no actual chest pain.  He states that shortness of breath has been chronic but has slightly improved.  He reports that he was discharged from [facility], running around  from a friend of his.  He is able to walk without a walker now and has increased his activity.  He continues to smoke about a pack to a pack and a half per day and is not interested in cessation at this time.  He states that he has been following up with [facility] orthopedics and they have discussed doing injections to his right shoulder for torn rotator cuff.  He denies  any recent hospitalization or visits to the emergency department.  Other than changes in insurance covering his medications and the issue with the pharmacy he states he has been compliant with his current medication regimen without any undue side effects.  ROS: 10 point review of systems has been reviewed and considered negative the exception was been listed in the HPI  Studies Reviewed: Aaron Aas   EKG Interpretation Date/Time:  Thursday Jan 15 2024 16:06:30 EDT Ventricular Rate:  86 PR Interval:  138 QRS Duration:  104 QT Interval:  364 QTC Calculation: 435 R Axis:   148  Text Interpretation: Normal sinus rhythm Indeterminate axis When compared with ECG of 15-Jan-2024 15:56, Nonspecific T wave abnormality, improved in Inferior leads Confirmed by Ronald Cockayne ([id number]) on 01/15/2024 4:09:42 PM    Event Monitor (Zio) 03/18/2023   Normal sinus rhythm with rare PACs and PVCs.   No atrial fibrillation.   Symptoms associated with PVCs.   No pathologic arrhythmias.   2D echo 02/18/2023 1. Left ventricular ejection fraction, by estimation, is 40 to 45%. The  left ventricle has mildly decreased function. The left ventricle  demonstrates regional wall motion abnormalities (inferior wall  hypokinesis). Left ventricular diastolic parameters  are indeterminate.   2. Right ventricular systolic function is normal. The right ventricular  size is normal. Tricuspid regurgitation signal is inadequate for assessing  PA pressure.   3. The mitral valve is normal in structure. No evidence of mitral valve  regurgitation. No evidence of mitral stenosis.   4. The aortic valve has an indeterminant number of cusps. There is mild  calcification of the aortic valve. Aortic valve regurgitation is not  visualized. Aortic valve sclerosis is present, with no evidence of aortic  valve stenosis.   5. The inferior vena cava is normal in size with greater than 50%  respiratory variability, suggesting right atrial pressure of  3 mmHg.  Lexiscan  MPI 12/13/22 Pharmacological myocardial perfusion imaging study with significant GI uptake artifact limiting interpretation Unable to exclude inferior, inferolateral wall, mid to distal anterior wall ischemia given artifact Global hypokinesis, EF estimated at 22% (ejection fraction possibly erroneous in the setting of GI uptake artifact) No EKG changes concerning for ischemia at peak stress or in recovery. Unable to exclude high risk scan, significant GI uptake artifact limiting interpretation.   Consider alternate imaging modality if clinically indicated   Limited TTE 12/03/21 1. Left ventricular ejection fraction, by estimation, is 40 to 45%. Left  ventricular ejection fraction by 2D MOD biplane is 45.1 %. The left  ventricle has mild to moderately decreased function. There is severe  hypokinesis of the left ventricular, entire   inferolateral wall.   2. Right ventricular systolic function is normal. The right ventricular  size is normal.   3. The mitral valve is normal in structure. No evidence of mitral valve  regurgitation.   4. The aortic valve was not well visualized. Aortic valve regurgitation  is not visualized. Aortic valve sclerosis is present, with no evidence of  aortic valve stenosis.   5. The inferior  vena cava is dilated in size with <50% respiratory  variability, suggesting right atrial pressure of 15 mmHg.  Risk Assessment/Calculations:             Physical Exam:   VS:  BP 113/67   Pulse 88   Ht 6\' 2"  (1.88 m)   Wt 207 lb 3.2 oz (94 kg)   SpO2 95%   BMI 26.60 kg/m    Wt Readings from Last 3 Encounters:  01/15/24 207 lb 3.2 oz (94 kg)  06/13/23 215 lb 9.6 oz (97.8 kg)  03/07/23 228 lb (103.4 kg)    GEN: Well nourished, well developed in no acute distress NECK: No JVD; No carotid bruits CARDIAC: RRR, I/VI systolic murmur RUSB, without rubs or gallops RESPIRATORY:  Clear to auscultation without rales, wheezing or rhonchi  ABDOMEN: Soft,  non-tender, non-distended EXTREMITIES:  No edema; No deformity   ASSESSMENT AND PLAN: .   Coronary artery disease of native coronary artery without angina.  Previously had undergone aspiration thrombectomy and PTCA of the mid RCA with stenting to the mid left circumflex.  Lexiscan  Myoview  completed last year showed no significant EKG changes concerning for ischemia but a large GI uptake with depressed EF.  Echocardiogram revealed LVEF 40-45%, LV was mildly decreased function, inferior wall hypokinesis, diastolic parameters were indeterminate.  Aortic valve sclerosis present with no evidence of stenosis.  Today is stable with no angina or anginal equivalents.  EKG completed reveals sinus rhythm with a rate of 86 with nonspecific T wave abnormalities with no acute change.  He is continued on ezetimibe 10 mg daily and rosuvastatin  40 mg daily.  He is requesting to be changed off of Brilinta  to clopidogrel .  He had previously been on full dose of Brilinta  and then on clopidogrel  when he had a new primary care provider his clopidogrel  was discontinued for approximately a year before he had additional stent placement.  So he did not have failure on clopidogrel .  Due to pharmacy mixup he has been without Brilinta  for a week at this point.  He has been loaded with Plavix  300 mg and then will start 75 mg daily.  Chronic HFrEF/ischemic cardiomyopathy with an LVEF of 40-45% with last echocardiogram 02/2023.  He states that shortness of breath is stable and unchanged.  He appears to be euvolemic on exam and is well compensated.  He is continued on losartan  12.5 mg daily, Jardiance  10 mg daily, he has furosemide  as needed but has not required the use of any diuretic therapy, previously was on MRA therapy that was discontinued during his stay at a rehab but for unknown reasons.  Currently not on beta-blocker therapy.  I have requested most recent labs from PCP.  No further medications were added today.  Will discuss  reinitiation of medications on return and repeat echocardiogram.  If he continues to have reduced LVEF can consider referral to advanced heart failure clinic.  At this time he is having issues with his insurance covering medications.  Palpitations which have been quiescent.   Mixed hyperlipidemia associated with type 2 diabetes with an LDL of 76 which is improved from prior 136 and 85 but goal is 55 or less.  We have requested labs from PCP.  He is continued on rosuvastatin  40 mg daily and ezetimibe 10 mg daily.  Would likely benefit from SGLT2 inhibitor but cost is an issue.  Primary hypertension with a blood pressure today 113/67.  Blood pressure remains stable.  He is continued on  losartan  12.5 mg daily.  He has been encouraged to continue to monitor his pressures 1 to 2 hours postmedication administration.  Tobacco abuse for total cessation is recommended that he is currently not interested in cessation.       Dispo: Patient return to clinic to see MD/APP in 3 months or sooner if needed  Signed, Jaiana Sheffer, NP

## 2024-01-15 NOTE — Patient Instructions (Signed)
 Medication Instructions:  Your physician recommends the following medication changes.  STOP TAKING: Brilinta   START TAKING: Plavix  300 mg in one dose, then 75 mg daily there after *If you need a refill on your cardiac medications before your next appointment, please call your pharmacy*  Lab Work: No labs ordered today  If you have labs (blood work) drawn today and your tests are completely normal, you will receive your results only by: MyChart Message (if you have MyChart) OR A paper copy in the mail If you have any lab test that is abnormal or we need to change your treatment, we will call you to review the results.  Testing/Procedures: No test ordered today   Follow-Up: At [facility], you and your health needs are our priority.  As part of our continuing mission to provide you with exceptional heart care, our providers are all part of one team.  This team includes your primary Cardiologist (physician) and Advanced Practice Providers or APPs (Physician Assistants and Nurse Practitioners) who all work together to provide you with the care you need, when you need it.  Your next appointment:   3 month(s)  Provider:   Sammy Crisp, MD or Ronald Cockayne, NP

## 2024-02-12 ENCOUNTER — Observation Stay
Admission: EM | Admit: 2024-02-12 | Discharge: 2024-02-13 | Disposition: A | Discharge status: Home / Self Care | Attending: Internal Medicine | Admitting: Internal Medicine

## 2024-02-12 ENCOUNTER — Observation Stay

## 2024-02-12 ENCOUNTER — Other Ambulatory Visit: Payer: Self-pay

## 2024-02-12 ENCOUNTER — Emergency Department

## 2024-02-12 ENCOUNTER — Encounter: Payer: Self-pay | Admitting: Emergency Medicine

## 2024-02-12 DIAGNOSIS — S92402A Displaced unspecified fracture of left great toe, initial encounter for closed fracture: Principal | ICD-10-CM

## 2024-02-12 DIAGNOSIS — Z23 Encounter for immunization: Secondary | ICD-10-CM | POA: Diagnosis not present

## 2024-02-12 DIAGNOSIS — Z6826 Body mass index (BMI) 26.0-26.9, adult: Secondary | ICD-10-CM | POA: Insufficient documentation

## 2024-02-12 DIAGNOSIS — G8254 Quadriplegia, C5-C7 incomplete: Secondary | ICD-10-CM | POA: Diagnosis not present

## 2024-02-12 DIAGNOSIS — R197 Diarrhea, unspecified: Secondary | ICD-10-CM | POA: Diagnosis not present

## 2024-02-12 DIAGNOSIS — Z720 Tobacco use: Secondary | ICD-10-CM | POA: Diagnosis present

## 2024-02-12 DIAGNOSIS — R0902 Hypoxemia: Principal | ICD-10-CM | POA: Diagnosis present

## 2024-02-12 DIAGNOSIS — I110 Hypertensive heart disease with heart failure: Secondary | ICD-10-CM | POA: Diagnosis not present

## 2024-02-12 DIAGNOSIS — I2510 Atherosclerotic heart disease of native coronary artery without angina pectoris: Secondary | ICD-10-CM | POA: Diagnosis not present

## 2024-02-12 DIAGNOSIS — E663 Overweight: Secondary | ICD-10-CM | POA: Diagnosis not present

## 2024-02-12 DIAGNOSIS — F17210 Nicotine dependence, cigarettes, uncomplicated: Secondary | ICD-10-CM | POA: Diagnosis not present

## 2024-02-12 DIAGNOSIS — Z1152 Encounter for screening for COVID-19: Secondary | ICD-10-CM | POA: Diagnosis not present

## 2024-02-12 DIAGNOSIS — S92492A Other fracture of left great toe, initial encounter for closed fracture: Secondary | ICD-10-CM | POA: Diagnosis not present

## 2024-02-12 DIAGNOSIS — I10 Essential (primary) hypertension: Secondary | ICD-10-CM | POA: Diagnosis present

## 2024-02-12 DIAGNOSIS — W1842XA Slipping, tripping and stumbling without falling due to stepping into hole or opening, initial encounter: Secondary | ICD-10-CM | POA: Diagnosis not present

## 2024-02-12 DIAGNOSIS — F32A Depression, unspecified: Secondary | ICD-10-CM | POA: Diagnosis present

## 2024-02-12 DIAGNOSIS — S92402B Displaced unspecified fracture of left great toe, initial encounter for open fracture: Secondary | ICD-10-CM | POA: Diagnosis present

## 2024-02-12 DIAGNOSIS — S92425B Nondisplaced fracture of distal phalanx of left great toe, initial encounter for open fracture: Secondary | ICD-10-CM | POA: Diagnosis not present

## 2024-02-12 DIAGNOSIS — E1142 Type 2 diabetes mellitus with diabetic polyneuropathy: Secondary | ICD-10-CM | POA: Diagnosis present

## 2024-02-12 DIAGNOSIS — Z7901 Long term (current) use of anticoagulants: Secondary | ICD-10-CM | POA: Insufficient documentation

## 2024-02-12 DIAGNOSIS — E785 Hyperlipidemia, unspecified: Secondary | ICD-10-CM | POA: Diagnosis not present

## 2024-02-12 DIAGNOSIS — E1169 Type 2 diabetes mellitus with other specified complication: Secondary | ICD-10-CM | POA: Diagnosis not present

## 2024-02-12 DIAGNOSIS — W19XXXA Unspecified fall, initial encounter: Secondary | ICD-10-CM

## 2024-02-12 DIAGNOSIS — Y9301 Activity, walking, marching and hiking: Secondary | ICD-10-CM | POA: Diagnosis not present

## 2024-02-12 DIAGNOSIS — S90931A Unspecified superficial injury of right great toe, initial encounter: Secondary | ICD-10-CM | POA: Diagnosis present

## 2024-02-12 DIAGNOSIS — Z7984 Long term (current) use of oral hypoglycemic drugs: Secondary | ICD-10-CM | POA: Diagnosis not present

## 2024-02-12 DIAGNOSIS — I5022 Chronic systolic (congestive) heart failure: Secondary | ICD-10-CM | POA: Diagnosis present

## 2024-02-12 DIAGNOSIS — S91112A Laceration without foreign body of left great toe without damage to nail, initial encounter: Secondary | ICD-10-CM

## 2024-02-12 LAB — CBC
HCT: 48 % (ref 39.0–52.0)
Hemoglobin: 15.6 g/dL (ref 13.0–17.0)
MCH: 30.8 pg (ref 26.0–34.0)
MCHC: 32.5 g/dL (ref 30.0–36.0)
MCV: 94.7 fL (ref 80.0–100.0)
Platelets: 157 10*3/uL (ref 150–400)
RBC: 5.07 MIL/uL (ref 4.22–5.81)
RDW: 14.4 % (ref 11.5–15.5)
WBC: 10 10*3/uL (ref 4.0–10.5)
nRBC: 0 % (ref 0.0–0.2)

## 2024-02-12 LAB — COMPREHENSIVE METABOLIC PANEL WITH GFR
ALT: 44 U/L (ref 0–44)
AST: 35 U/L (ref 15–41)
Albumin: 2.9 g/dL — ABNORMAL LOW (ref 3.5–5.0)
Alkaline Phosphatase: 276 U/L — ABNORMAL HIGH (ref 38–126)
Anion gap: 11 (ref 5–15)
BUN: 23 mg/dL — ABNORMAL HIGH (ref 6–20)
CO2: 25 mmol/L (ref 22–32)
Calcium: 8.7 mg/dL — ABNORMAL LOW (ref 8.9–10.3)
Chloride: 105 mmol/L (ref 98–111)
Creatinine, Ser: 1.23 mg/dL (ref 0.61–1.24)
GFR, Estimated: >60 mL/min (ref >60)
Glucose, Bld: 168 mg/dL — ABNORMAL HIGH (ref 70–99)
Potassium: 3.5 mmol/L (ref 3.5–5.1)
Sodium: 141 mmol/L (ref 135–145)
Total Bilirubin: 0.6 mg/dL (ref 0.0–1.2)
Total Protein: 7.1 g/dL (ref 6.5–8.1)

## 2024-02-12 LAB — RESP PANEL BY RT-PCR (RSV, FLU A&B, COVID)  RVPGX2
Influenza A by PCR: NEGATIVE
Influenza B by PCR: NEGATIVE
Resp Syncytial Virus by PCR: NEGATIVE
SARS Coronavirus 2 by RT PCR: NEGATIVE

## 2024-02-12 LAB — BRAIN NATRIURETIC PEPTIDE: B Natriuretic Peptide: 71.7 pg/mL (ref 0.0–100.0)

## 2024-02-12 LAB — CBG MONITORING, ED: Glucose-Capillary: 132 mg/dL — ABNORMAL HIGH (ref 70–99)

## 2024-02-12 MED ORDER — MORPHINE SULFATE (PF) 2 MG/ML IV SOLN: 2.0000 mg | INTRAVENOUS | Status: DC | PRN

## 2024-02-12 MED ORDER — ROSUVASTATIN CALCIUM 20 MG PO TABS
40.0000 mg | ORAL_TABLET | Freq: Every day | ORAL | Status: DC
  Administered 2024-02-13: 40 mg via ORAL
  Filled 2024-02-12: qty 2

## 2024-02-12 MED ORDER — LOPERAMIDE HCL 2 MG PO CAPS: 2.0000 mg | ORAL_CAPSULE | Freq: Two times a day (BID) | ORAL | Status: DC | PRN

## 2024-02-12 MED ORDER — EZETIMIBE 10 MG PO TABS
10.0000 mg | ORAL_TABLET | Freq: Every day | ORAL | Status: DC
  Administered 2024-02-13: 10 mg via ORAL
  Filled 2024-02-12: qty 1

## 2024-02-12 MED ORDER — FUROSEMIDE 10 MG/ML IJ SOLN: 40.0000 mg | Freq: Two times a day (BID) | INTRAMUSCULAR | Status: DC

## 2024-02-12 MED ORDER — ACETAMINOPHEN 325 MG PO TABS: 650.0000 mg | ORAL_TABLET | Freq: Four times a day (QID) | ORAL | Status: DC | PRN

## 2024-02-12 MED ORDER — DM-GUAIFENESIN ER 30-600 MG PO TB12: 1.0000 | ORAL_TABLET | Freq: Two times a day (BID) | ORAL | Status: DC | PRN

## 2024-02-12 MED ORDER — LEVETIRACETAM 500 MG PO TABS
500.0000 mg | ORAL_TABLET | Freq: Two times a day (BID) | ORAL | Status: DC
  Administered 2024-02-12 – 2024-02-13 (×2): 500 mg via ORAL
  Filled 2024-02-12 (×2): qty 1

## 2024-02-12 MED ORDER — CLOPIDOGREL BISULFATE 75 MG PO TABS
75.0000 mg | ORAL_TABLET | Freq: Every day | ORAL | Status: DC
  Administered 2024-02-13: 75 mg via ORAL
  Filled 2024-02-12: qty 1

## 2024-02-12 MED ORDER — TETANUS-DIPHTH-ACELL PERTUSSIS 5-2.5-18.5 LF-MCG/0.5 IM SUSY
0.5000 mL | PREFILLED_SYRINGE | Freq: Once | INTRAMUSCULAR | Status: AC
  Administered 2024-02-12: 0.5 mL via INTRAMUSCULAR
  Filled 2024-02-12: qty 0.5

## 2024-02-12 MED ORDER — DULOXETINE HCL 60 MG PO CPEP
120.0000 mg | ORAL_CAPSULE | Freq: Every day | ORAL | Status: DC
  Administered 2024-02-12: 120 mg via ORAL
  Filled 2024-02-12: qty 2

## 2024-02-12 MED ORDER — LIDOCAINE-EPINEPHRINE-TETRACAINE (LET) TOPICAL GEL
3.0000 mL | Freq: Once | TOPICAL | Status: AC
  Administered 2024-02-12: 3 mL via TOPICAL
  Filled 2024-02-12: qty 3

## 2024-02-12 MED ORDER — ASPIRIN 81 MG PO CHEW
81.0000 mg | CHEWABLE_TABLET | Freq: Every day | ORAL | Status: DC
  Administered 2024-02-13: 81 mg via ORAL
  Filled 2024-02-12: qty 1

## 2024-02-12 MED ORDER — DOXYCYCLINE HYCLATE 100 MG PO TABS
100.0000 mg | ORAL_TABLET | Freq: Two times a day (BID) | ORAL | Status: DC
  Administered 2024-02-12 – 2024-02-13 (×2): 100 mg via ORAL
  Filled 2024-02-12 (×2): qty 1

## 2024-02-12 MED ORDER — POLYVINYL ALCOHOL 1.4 % OP SOLN: 1.0000 [drp] | Freq: Four times a day (QID) | OPHTHALMIC | Status: DC | PRN

## 2024-02-12 MED ORDER — LOSARTAN POTASSIUM 25 MG PO TABS
12.5000 mg | ORAL_TABLET | Freq: Every day | ORAL | Status: DC
  Administered 2024-02-13: 12.5 mg via ORAL
  Filled 2024-02-12: qty 0.5

## 2024-02-12 MED ORDER — LIDOCAINE HCL (PF) 1 % IJ SOLN
5.0000 mL | Freq: Once | INTRAMUSCULAR | Status: AC
  Administered 2024-02-12: 5 mL
  Filled 2024-02-12: qty 5

## 2024-02-12 MED ORDER — NITROGLYCERIN 0.4 MG SL SUBL: 0.4000 mg | SUBLINGUAL_TABLET | SUBLINGUAL | Status: DC | PRN

## 2024-02-12 MED ORDER — ALBUTEROL SULFATE (2.5 MG/3ML) 0.083% IN NEBU: 3.0000 mL | INHALATION_SOLUTION | RESPIRATORY_TRACT | Status: DC | PRN

## 2024-02-12 MED ORDER — NICOTINE 21 MG/24HR TD PT24
21.0000 mg | MEDICATED_PATCH | Freq: Every day | TRANSDERMAL | Status: DC
  Filled 2024-02-12: qty 1

## 2024-02-12 MED ORDER — AMOXICILLIN-POT CLAVULANATE 875-125 MG PO TABS
1.0000 | ORAL_TABLET | Freq: Two times a day (BID) | ORAL | Status: DC
  Administered 2024-02-12 – 2024-02-13 (×2): 1 via ORAL
  Filled 2024-02-12 (×2): qty 1

## 2024-02-12 MED ORDER — PREGABALIN 50 MG PO CAPS
150.0000 mg | ORAL_CAPSULE | Freq: Three times a day (TID) | ORAL | Status: DC
  Administered 2024-02-12 – 2024-02-13 (×2): 150 mg via ORAL
  Filled 2024-02-12 (×2): qty 3

## 2024-02-12 MED ORDER — HYDRALAZINE HCL 20 MG/ML IJ SOLN: 5.0000 mg | INTRAMUSCULAR | Status: DC | PRN

## 2024-02-12 MED ORDER — INSULIN ASPART 100 UNIT/ML IJ SOLN: 0.0000 [IU] | Freq: Every day | INTRAMUSCULAR | Status: DC

## 2024-02-12 MED ORDER — BACLOFEN 10 MG PO TABS
15.0000 mg | ORAL_TABLET | Freq: Three times a day (TID) | ORAL | Status: DC
  Administered 2024-02-12 – 2024-02-13 (×2): 15 mg via ORAL
  Filled 2024-02-12 (×2): qty 2

## 2024-02-12 MED ORDER — INSULIN ASPART 100 UNIT/ML IJ SOLN
0.0000 [IU] | Freq: Three times a day (TID) | INTRAMUSCULAR | Status: DC
  Administered 2024-02-13: 1 [IU] via SUBCUTANEOUS
  Filled 2024-02-12: qty 1

## 2024-02-12 MED ORDER — OXYCODONE HCL 5 MG PO TABS: 5.0000 mg | ORAL_TABLET | ORAL | Status: DC | PRN

## 2024-02-12 MED ORDER — TAMSULOSIN HCL 0.4 MG PO CAPS
0.4000 mg | ORAL_CAPSULE | Freq: Every day | ORAL | Status: DC
  Administered 2024-02-12: 0.4 mg via ORAL
  Filled 2024-02-12: qty 1

## 2024-02-12 MED ORDER — HEPARIN SODIUM (PORCINE) 5000 UNIT/ML IJ SOLN
5000.0000 [IU] | Freq: Three times a day (TID) | INTRAMUSCULAR | Status: DC
  Administered 2024-02-12 – 2024-02-13 (×2): 5000 [IU] via SUBCUTANEOUS
  Filled 2024-02-12 (×2): qty 1

## 2024-02-12 MED ORDER — MELATONIN 5 MG PO TABS
2.5000 mg | ORAL_TABLET | Freq: Every day | ORAL | Status: DC
  Administered 2024-02-12: 2.5 mg via ORAL
  Filled 2024-02-12: qty 1

## 2024-02-12 MED ORDER — ONDANSETRON HCL 4 MG/2ML IJ SOLN: 4.0000 mg | Freq: Three times a day (TID) | INTRAMUSCULAR | Status: DC | PRN

## 2024-02-12 MED ORDER — FERROUS SULFATE 325 (65 FE) MG PO TABS
325.0000 mg | ORAL_TABLET | Freq: Every day | ORAL | Status: DC
  Administered 2024-02-13: 325 mg via ORAL
  Filled 2024-02-12: qty 1

## 2024-02-12 NOTE — ED Notes (Signed)
 Pt oxygen saturation at 84% on RA. Pt placed on 3L BNC while sleeping with saturation increasing to 93%.

## 2024-02-12 NOTE — H&P (Signed)
 History and Physical    [name]. FMW:[id number] DOB: May 30, 1971 DOA: 02/12/2024  Referring MD/NP/PA:   PCP: Rosan Mix, MD   Patient coming from:  The patient is coming from home.     Chief Complaint: left great toe injury  HPI: [name]. is a 53 y.o. male with medical history significant of sCHF with EF 40-45%, HTN, HLD, DM, CAD, STEMI, depression, BPH, esophageal perforation, C-spine myelopathy with incomplete quadriplegia C5/C6, who presents with left great toe injury.   Pt states that he was walking outside w/o shoes, stepped in a hole, bent his left great toe, caused a large laceration to left great toe below toe nail. He has sharp pain in left great toe, which is constant, moderate to severe, nonradiating, aggravated by movement.  ED physician did washout and suture as recommended by Dr. Malvin of podiatry.  Bleeding is stopped.  Patient denies fall or any injury to head and neck. Patient reports twe episodes of diarrhea today, no nausea, vomiting or abdominal pain.   Patient is normally not using oxygen, but was found to have oxygen desaturated to 84% on room air, which improved to 95% on 3 L oxygen.  Patient has mild dry cough, denies SOB and chest pain.  No fever or chills.  Patient has bilateral lower leg edema.  Data reviewed independently and ED Course: pt was found to have BNP 71.7, WBC 10.0, GFR> 60, temperature normal, blood pressure 118/77, heart rate 79, RR 16.  Chest x-ray seems to have vascular cephalization.  Patient is placed in telemetry bed for observation. EDP consulted Dr. Malvin of podiatry.  X-ray of left foot: acute fracture base of the first distal phalanx.   EKG: I have personally reviewed.  Sinus rhythm, QTc 462, low voltage.   Review of Systems:   General: no fevers, chills, no body weight gain, has fatigue HEENT: no blurry vision, hearing changes or sore throat Respiratory: no dyspnea, has dry coughing, no wheezing CV:  no chest pain, no palpitations GI: no nausea, vomiting, abdominal pain, diarrhea, constipation GU: no dysuria, burning on urination, increased urinary frequency, hematuria  Ext: has leg edema Neuro: no unilateral weakness, numbness, or tingling, no vision change or hearing loss Skin: no rash. Has skin laceration to left greater toe MSK: No muscle spasm, no deformity, no limitation of range of movement in spin.  Has left greater toe injury with pain Heme: No easy bruising.  Travel history: No recent long distant travel.   Allergy: No Known Allergies  Past Medical History:  Diagnosis Date   CHF (congestive heart failure) (HCC)    Coronary artery disease    Diabetes mellitus without complication (HCC)    Ischemic cardiomyopathy    Myocardial infarction (HCC)    inferior STEMI ~ 2016; 01/19/21 with CHB/cardiogenic shock requiring brief TVP, IABP, s/p aspiration thrombectomy/ballon angioplasty for late thrombosis mRCA stent & DES for acute occlusion mLCX    Past Surgical History:  Procedure Laterality Date   AMPUTATION Right 10/01/2020   Procedure: AMPUTATION 5th  RAY AND IRRIGATION AND DEBRIDEMENT;  Surgeon: Ashley Soulier, DPM;  Location: [facility];  Service: Podiatry;  Laterality: Right;   AMPUTATION Right 10/11/2020   Procedure: AMPUTATION RAY;  Surgeon: Lennie Barter, DPM;  Location: [facility];  Service: Podiatry;  Laterality: Right;   ANTERIOR CERVICAL DECOMPRESSION/DISCECTOMY FUSION 4 LEVELS N/A 04/25/2021   Procedure: Cervical Four-Five, Cervical Five-Six, Cervical Six-Seven, Cervical Seven- Thoracic One  Anterior cervical decompression/Discectomy/fusion;  Surgeon: Cheryle,  Debby LABOR, MD;  Location: [facility];  Service: Neurosurgery;  Laterality: N/A;  Cervical Four-Five, Cervical Five-Six, Cervical Six-Seven, Cervical Seven- Thoracic One  Anterior cervical decompression/Discectomy/fusion   BACK SURGERY     CARDIAC CATHETERIZATION     CORONARY THROMBECTOMY N/A 01/19/2021   Procedure:  Coronary Thrombectomy;  Surgeon: Dann Candyce RAMAN, MD;  Location: [facility];  Service: Cardiovascular;  Laterality: N/A;   CORONARY/GRAFT ACUTE MI REVASCULARIZATION N/A 01/19/2021   Procedure: Coronary/Graft Acute MI Revascularization;  Surgeon: Dann Candyce RAMAN, MD;  Location: [facility];  Service: Cardiovascular;  Laterality: N/A;   IABP INSERTION N/A 01/19/2021   Procedure: IABP Insertion;  Surgeon: Dann Candyce RAMAN, MD;  Location: [facility];  Service: Cardiovascular;  Laterality: N/A;   INCISION AND DRAINAGE Right 09/29/2020   Procedure: INCISION AND DRAINAGE, RIGHT FOOT;  Surgeon: Ashley Soulier, DPM;  Location: [facility];  Service: Podiatry;  Laterality: Right;   LEFT HEART CATH AND CORONARY ANGIOGRAPHY N/A 01/19/2021   Procedure: LEFT HEART CATH AND CORONARY ANGIOGRAPHY;  Surgeon: Dann Candyce RAMAN, MD;  Location: [facility];  Service: Cardiovascular;  Laterality: N/A;   LOWER EXTREMITY ANGIOGRAPHY Right 10/03/2020   Procedure: Lower Extremity Angiography;  Surgeon: Jama Cordella MATSU, MD;  Location: [facility];  Service: Cardiovascular;  Laterality: Right;   TENDON LENGTHENING Right 10/11/2020   Procedure: TENDON LENGTHENING;  Surgeon: Lennie Barter, DPM;  Location: [facility];  Service: Podiatry;  Laterality: Right;    Social History:  reports that he has been smoking cigarettes. He has a 33 pack-year smoking history. He has never used smokeless tobacco. He reports that he does not currently use alcohol. He reports that he does not use drugs.  Family History:  Family History  Problem Relation Age of Onset   Breast cancer Mother    Heart attack Father    Diabetes Father    Heart disease Father    Lung cancer Paternal Grandmother      Prior to Admission medications   Medication Sig Start Date End Date Taking? Authorizing Provider  acetaminophen  (TYLENOL ) 650 MG CR tablet Take 1 tablet by mouth every 8 (eight) hours as  needed for pain.    [provider]  albuterol  (PROVENTIL ) (2.5 MG/3ML) 0.083% nebulizer solution Take 3 mLs (2.5 mg total) by nebulization every 4 (four) hours as needed for wheezing or shortness of breath. 12/19/21   Agarwala, Ravi, MD  albuterol  (VENTOLIN  HFA) 108 (90 Base) MCG/ACT inhaler Inhale 2 puffs into the lungs every 6 (six) hours as needed for wheezing. 10/31/20   [provider]  ammonium lactate (LAC-HYDRIN) 12 % lotion Apply 1 Application topically as needed for dry skin.    [provider]  Artificial Saliva (BIOTENE ORALBALANCE DRY MOUTH) GEL Use as directed 1 application  in the mouth or throat as needed.    [provider]  Baclofen  5 MG TABS Take 15 mg by mouth 3 (three) times daily.    [provider]  bismuth  subsalicylate (PEPTO BISMOL) 262 MG/15ML suspension Take 30 mLs by mouth every 4 (four) hours as needed.    [provider]  carboxymethylcellulose (REFRESH PLUS) 0.5 % SOLN Place 1 drop into both eyes 4 (four) times daily.    [provider]  clopidogrel  (PLAVIX ) 75 MG tablet Take 1 tablet (75 mg total) by mouth daily. 01/15/24 01/14/25  Gerard Frederick, NP  Dextrose , Diabetic Use, (INSTA-GLUCOSE) 77.4 % GEL Take 31 g by  mouth.  Take 31 g (24 g of dextrose  total) by mouth every fifteen (15) minutes as needed for low blood sugar.    [provider]  DULoxetine (CYMBALTA) 60 MG capsule Take 120 mg by mouth at bedtime.    [provider]  empagliflozin  (JARDIANCE ) 10 MG TABS tablet Take 10 mg by mouth daily.    [provider]  ezetimibe (ZETIA) 10 MG tablet Take 10 mg by mouth daily.    [provider]  ferrous sulfate 325 (65 FE) MG tablet Take 325 mg by mouth daily with breakfast.    [provider]  fluticasone  (FLONASE ) 50 MCG/ACT nasal spray Place 1 spray into both nostrils daily as needed.    [provider]  furosemide  (LASIX ) 40 MG tablet Take 1 tablet (40 mg  total) by mouth daily as needed. Patient not taking: Reported on 01/15/2024 06/17/23   West, Katlyn D, NP  glipiZIDE  (GLUCOTROL ) 10 MG tablet Take 10 mg by mouth daily.    [provider]  Glucagon, rDNA, (GLUCAGON EMERGENCY) 1 MG KIT Inject into the muscle as needed.    [provider]  insulin  aspart (NOVOLOG ) 100 UNIT/ML injection Inject 0-9 Units into the skin every 4 (four) hours. Patient not taking: Reported on 01/15/2024 12/20/21   Agarwala, Ravi, MD  insulin  glargine (LANTUS ) 100 UNIT/ML injection Inject 68 Units into the skin at bedtime. Patient not taking: Reported on 01/15/2024    [provider]  insulin  lispro (HUMALOG ) 100 UNIT/ML injection Inject 7 Units into the skin 3 (three) times daily before meals. Per sliding scale Patient not taking: Reported on 01/15/2024    [provider]  loperamide  (IMODIUM ) 2 MG capsule Take 2 mg by mouth as needed for diarrhea or loose stools.    [provider]  losartan  (COZAAR ) 25 MG tablet Take 0.5 tablets by mouth daily. 02/13/21   [provider]  melatonin 3 MG TABS tablet Take 3 mg by mouth at bedtime.    [provider]  Menthol , Topical Analgesic, (BIOFREEZE) 4 % GEL Apply topically every 8 (eight) hours as needed.    [provider]  metFORMIN  (GLUCOPHAGE ) 1000 MG tablet Take 500 mg by mouth daily with breakfast.    [provider]  nitroGLYCERIN  (NITROSTAT ) 0.4 MG SL tablet Place 0.4 mg under the tongue every 5 (five) minutes as needed for chest pain.    [provider]  Oxycodone  HCl 10 MG TABS Take 5 mg by mouth every 4 (four) hours as needed.    [provider]  polyethylene glycol (MIRALAX  / GLYCOLAX ) 17 g packet Take 17 g by mouth daily as needed.    [provider]  pregabalin  (LYRICA ) 150 MG capsule Take 150 mg by mouth 3 (three) times daily.    [provider]  rosuvastatin  (CRESTOR ) 40 MG tablet Take 1 tablet (40 mg total)  by mouth daily. 11/07/22   End, Lonni, MD  tamsulosin (FLOMAX) 0.4 MG CAPS capsule Take 0.4 mg by mouth at bedtime.    [provider]  terbinafine (LAMISIL) 250 MG tablet Take 250 mg by mouth daily.    [provider]  vitamin C (ASCORBIC ACID) 250 MG tablet Take 250 mg by mouth daily.    [provider]    Physical Exam: Vitals:   02/12/24 1630 02/12/24 2139 02/12/24 2150 02/12/24 2200  BP: 118/77 (!) 137/94  (!) 140/89  Pulse: 79   72  Resp:  Temp:   (!) 97.4 F (36.3 C)   TempSrc:   Oral   SpO2: 93%   96%  Weight:      Height:       General: Not in acute distress HEENT:       Eyes: PERRL, EOMI, no jaundice       ENT: No discharge from the ears and nose, no pharynx injection, no tonsillar enlargement.        Neck: No JVD, no bruit, no mass felt. Heme: No neck lymph node enlargement. Cardiac: S1/S2, RRR, No murmurs, No gallops or rubs. Respiratory: No rales, wheezing, rhonchi or rubs. GI: Soft, nondistended, nontender, no rebound pain, no organomegaly, BS present. GU: No hematuria Ext: 1+ pitting leg edema bilaterally. 1+DP/PT pulse bilaterally. Musculoskeletal: No joint deformities, No joint redness or warmth, no limitation of ROM in spin. Skin: Has a laceration to left greater toe.   Neuro: Alert, oriented X3, cranial nerves II-XII grossly intact, moves all extremities. Psych: Patient is not psychotic, no suicidal or hemocidal ideation.  Labs on Admission: I have personally reviewed following labs and imaging studies  CBC: Recent Labs  Lab 02/12/24 1938  WBC 10.0  HGB 15.6  HCT 48.0  MCV 94.7  PLT 157   Basic Metabolic Panel: Recent Labs  Lab 02/12/24 1938  NA 141  K 3.5  CL 105  CO2 25  GLUCOSE 168*  BUN 23*  CREATININE 1.23  CALCIUM  8.7*   GFR: Estimated Creatinine Clearance: 80.8 mL/min (by C-G formula based on SCr of 1.23 mg/dL). Liver Function Tests: Recent Labs  Lab 02/12/24 1938  AST 35  ALT 44   ALKPHOS 276*  BILITOT 0.6  PROT 7.1  ALBUMIN  2.9*   No results for input(s): LIPASE, AMYLASE in the last 168 hours. No results for input(s): AMMONIA in the last 168 hours. Coagulation Profile: No results for input(s): INR, PROTIME in the last 168 hours. Cardiac Enzymes: No results for input(s): CKTOTAL, CKMB, CKMBINDEX, TROPONINI in the last 168 hours. BNP (last 3 results) No results for input(s): PROBNP in the last 8760 hours. HbA1C: No results for input(s): HGBA1C in the last 72 hours. CBG: Recent Labs  Lab 02/12/24 2146  GLUCAP 132*   Lipid Profile: No results for input(s): CHOL, HDL, LDLCALC, TRIG, CHOLHDL, LDLDIRECT in the last 72 hours. Thyroid Function Tests: No results for input(s): TSH, T4TOTAL, FREET4, T3FREE, THYROIDAB in the last 72 hours. Anemia Panel: No results for input(s): VITAMINB12, FOLATE, FERRITIN, TIBC, IRON, RETICCTPCT in the last 72 hours. Urine analysis:    Component Value Date/Time   COLORURINE YELLOW 01/29/2021 1736   APPEARANCEUR HAZY (A) 01/29/2021 1736   LABSPEC 1.013 01/29/2021 1736   PHURINE 6.0 01/29/2021 1736   GLUCOSEU NEGATIVE 01/29/2021 1736   HGBUR LARGE (A) 01/29/2021 1736   BILIRUBINUR NEGATIVE 01/29/2021 1736   BILIRUBINUR Small 04/13/2015 1516   KETONESUR NEGATIVE 01/29/2021 1736   PROTEINUR 100 (A) 01/29/2021 1736   UROBILINOGEN 4.0 04/13/2015 1516   UROBILINOGEN 1.0 02/28/2010 2235   NITRITE NEGATIVE 01/29/2021 1736   LEUKOCYTESUR NEGATIVE 01/29/2021 1736   Sepsis Labs: @LABRCNTIP (procalcitonin:4,lacticidven:4) )No results found for this or any previous visit (from the past 240 hours).   Radiological Exams on Admission:   Assessment/Plan Principal Problem:   Oxygen desaturation Active Problems:   Chronic systolic CHF (congestive heart failure) (HCC)   Fracture of great toe, left, open   Essential hypertension   Hyperlipidemia associated with type 2 diabetes  mellitus (HCC)   CAD (  coronary artery disease)   Diarrhea   Tobacco abuse   Depression   Incomplete quadriplegia at C5-6 level (HCC)   Overweight (BMI 25.0-29.9)   Type 2 diabetes mellitus with peripheral neuropathy (HCC)   Assessment and Plan:  Oxygen desaturation:  Patient normally not using oxygen, but was found to have oxygen desaturation to 84% on room air, which has improved to 95% on 3 L oxygen.  Patient denies chest pain or SOB.  Patient has  mild dry cough.  No respiratory distress.  Etiology is not clear, possibly due to mild CHF exacerbation.  BNP is normal 71.7, but patient has bilateral lower leg edema, indicating possible mild CHF exacerbation.  Chest x-ray showed vascular cephalization on my reading. His low BNP may be due to obesity.  Another potential differential possibility is due to pain medication use.  Patient received dose of 200 mg of fentanyl  earlier.  Patient does not have chest pain, no tachycardia, normally active, not bedbound, low suspicions for PE.  -Lasix  40 mg bid by IV -Daily weights -strict I/O's -Low salt diet -Fluid restriction -As needed bronchodilators for shortness of breath  Chronic systolic CHF: 2D echo on 12/21/2021 showed EF of 40-45% with grade 1 diastolic dysfunction. -See above - Started IV Lasix  40 mg twice daily  Fracture of great toe, left, open: X-ray of left foot showed acute fracture base of the first distal phalanx.  ED physician did washout and suturing of laceration, bleeding has stopped. ED physician consulted Dr. Malvin of podiatry, who recommended to start patient on Augmentin  and doxycycline; and follow-up with patient on Wednesday in the [place] office. I sent secure chat message to Dr. Malvin asking for consultation since patient will be admitted to hospital due to oxygen desaturation. -will place in tele bed for obs -Abx: Patient received 1 dose of Ancef  in ED, will start Augmentin  and doxycycline - Patient received  Tdap - Pain control: As needed morphine , oxycodone , Tylenol   Essential hypertension: - Cozaar  - Patient is on IV Lasix  - IV hydralazine  as needed  Hyperlipidemia associated with type 2 diabetes mellitus (HCC) - Zetia and Crestor   CAD (coronary artery disease): No chest pain - Crestor , Plavix , ASA  Diarrhea: Patient reports 2 episode of diarrhea earlier today.  No fever or leukocytosis.  No nausea vomiting or abdominal pain.  Low suspicions for C. difficile. - As needed Imodium  empirically  Tobacco abuse -Nicotine  patch  Depression -Cymbalta  Incomplete quadriplegia at C5-6 level [facility]): Patient has intermittent spasm in the legs.  He is able to walk without using walker per pt. -Fall precaution - Continue baclofen , Lyrica , keppara  Overweight (BMI 25.0-29.9): Body weight 93.4 kg, BMI 26.45 - Encourage losing weight - Exercise healthy diet  Diabetes mellitus with peripheral neuropathy [facility]): Recent A1c 8.7, poorly controlled.  Patient is taking Jardiance , glipizide  and metformin  at home. - Sliding scale insulin      DVT ppx: SQ Heparin   Code Status: Full code  Family Communication:     not done, no family member is at bed side.     Disposition Plan:  Anticipate discharge back to previous environment  Consults called:  consulted Dr. Malvin of podiatry.  Admission status and Level of care: Telemetry Cardiac:    for obs as inpt        Dispo: The patient is from: Home              Anticipated d/c is to: Home  Anticipated d/c date is: 1 day              Patient currently is not medically stable to d/c.    Severity of Illness:  The appropriate patient status for this patient is OBSERVATION. Observation status is judged to be reasonable and necessary in order to provide the required intensity of service to ensure the patient's safety. The patient's presenting symptoms, physical exam findings, and initial radiographic and laboratory data in the context  of their medical condition is felt to place them at decreased risk for further clinical deterioration. Furthermore, it is anticipated that the patient will be medically stable for discharge from the hospital within 2 midnights of admission.        Date of Service 02/12/2024    Caleb Exon [facility]   If 7PM-7AM, please contact night-coverage [email or web address] 02/12/2024, 10:12 PM

## 2024-02-12 NOTE — ED Provider Notes (Signed)
 [facility] Emergency Department Provider Note     Event Date/Time   First MD Initiated Contact with Patient 02/12/24 1502     (approximate)   History   Laceration   HPI  [name]. is a 53 y.o. male with a history of DMII noncompliant, CHF, CAD and right foot amputation presents to the ED via EMS for evaluation of left great toe injury sustaining a laceration.  Patient reports he was walking outside with no shoes and believed he tripped.  Patient reports he stepped in a hole and bent his toe forward.  Unknown tetanus status. Patient also notes increased leg swelling since Monday.  History of CHF.  Patient believes this may have contributed to his fall.    Physical Exam   Triage Vital Signs: ED Triage Vitals  Encounter Vitals Group     BP 02/12/24 1432 103/63     Girls Systolic BP Percentile --      Girls Diastolic BP Percentile --      Boys Systolic BP Percentile --      Boys Diastolic BP Percentile --      Pulse Rate 02/12/24 1432 88     Resp 02/12/24 1432 16     Temp 02/12/24 1432 98.2 F (36.8 C)     Temp Source 02/12/24 1432 Oral     SpO2 02/12/24 1432 95 %     Weight 02/12/24 1428 207 lb 3.7 oz (94 kg)     Height 02/12/24 1428 6' 2 (1.88 m)     Head Circumference --      Peak Flow --      Pain Score 02/12/24 1428 4     Pain Loc --      Pain Education --      Exclude from Growth Chart --     Most recent vital signs: Vitals:   02/12/24 1600 02/12/24 1630  BP: 109/72 118/77  Pulse: 82 79  Resp:    Temp:    SpO2: 94% 93%    General Awake, no distress.  HEENT NCAT.  CV:  Good peripheral perfusion.  RESP:  Normal effort.  ABD:  No distention.  Other:  Bilateral peripheral edema.  No pitting edema.  Left great toe is pictured below.  Neurovascular status intact with vascular Doppler of DPA.  Sensation is intact distally.  No obvious tendon exposure.  Range of motion limited due to pain; unable to fully excess tendon  integrity.  Good capillary refill.     ED Results / Procedures / Treatments   Labs (all labs ordered are listed, but only abnormal results are displayed) Labs Reviewed  COMPREHENSIVE METABOLIC PANEL WITH GFR - Abnormal; Notable for the following components:      Result Value   Glucose, Bld 168 (*)    BUN 23 (*)    Calcium  8.7 (*)    Albumin  2.9 (*)    Alkaline Phosphatase 276 (*)    All other components within normal limits  RESP PANEL BY RT-PCR (RSV, FLU A&B, COVID)  RVPGX2  CBC  BRAIN NATRIURETIC PEPTIDE  HIV ANTIBODY (ROUTINE TESTING W REFLEX)  BASIC METABOLIC PANEL WITH GFR  CBC  MAGNESIUM     RADIOLOGY  I personally viewed and evaluated these images as part of my medical decision making, as well as reviewing the written report by the radiologist.  ED Provider Interpretation: Acute fracture of the left first distal phalanx.  DG Foot Complete Left Result Date:  02/12/2024 CLINICAL DATA:  Fall with laceration to the great toe. EXAM: LEFT FOOT - COMPLETE 3+ VIEW COMPARISON:  None Available. FINDINGS: There is an acute oblique fracture through the base of the first distal phalanx. The main fracture fragment is oblique located medially with fracture fragments distracted 3 mm. There is no evidence for dislocation. Bandages overlie the first toe obscuring fine soft tissue detail. No definite radiopaque foreign body identified. IMPRESSION: Acute fracture base of the first distal phalanx. Electronically Signed   By: Greig Pique M.D.   On: 02/12/2024 15:25   PROCEDURES:  Critical Care performed: No  .Laceration Repair  Date/Time: 02/12/2024 9:31 PM  Performed by: Margrette Monte A, PA-C Authorized by: Margrette Monte A, PA-C   Consent:    Consent obtained:  Verbal   Consent given by:  Patient   Risks discussed:  Infection, pain, tendon damage, poor cosmetic result, poor wound healing, need for additional repair and nerve damage Laceration details:    Location:  Toe   Toe  location:  L big toe   Length (cm):  4   Depth (mm):  1.5 Exploration:    Hemostasis achieved with:  Direct pressure and LET Treatment:    Area cleansed with:  Povidone-iodine  and saline   Amount of cleaning:  Extensive   Irrigation solution:  Sterile saline   Irrigation method:  Pressure wash Skin repair:    Repair method:  Sutures   Suture size:  4-0   Suture material:  Nylon   Suture technique:  Simple interrupted   Number of sutures:  13 Approximation:    Approximation:  Close Repair type:    Repair type:  Simple Post-procedure details:    Dressing:  Antibiotic ointment, tube gauze and non-adherent dressing   Procedure completion:  Tolerated   MEDICATIONS ORDERED IN ED: Medications  albuterol  (PROVENTIL ) (2.5 MG/3ML) 0.083% nebulizer solution 3 mL (has no administration in time range)  dextromethorphan-guaiFENesin  (MUCINEX  DM) 30-600 MG per 12 hr tablet 1 tablet (has no administration in time range)  oxyCODONE  (Oxy IR/ROXICODONE ) immediate release tablet 5 mg (has no administration in time range)  morphine  (PF) 2 MG/ML injection 2 mg (has no administration in time range)  nicotine  (NICODERM CQ  - dosed in mg/24 hours) patch 21 mg (has no administration in time range)  ondansetron  (ZOFRAN ) injection 4 mg (has no administration in time range)  hydrALAZINE  (APRESOLINE ) injection 5 mg (has no administration in time range)  acetaminophen  (TYLENOL ) tablet 650 mg (has no administration in time range)  insulin  aspart (novoLOG ) injection 0-9 Units (has no administration in time range)  insulin  aspart (novoLOG ) injection 0-5 Units (has no administration in time range)  heparin  injection 5,000 Units (has no administration in time range)  amoxicillin -clavulanate (AUGMENTIN ) 875-125 MG per tablet 1 tablet (has no administration in time range)  doxycycline  (VIBRA -TABS) tablet 100 mg (has no administration in time range)  loperamide  (IMODIUM ) capsule 2 mg (has no administration in time  range)  lidocaine  (PF) (XYLOCAINE ) 1 % injection 5 mL (5 mLs Infiltration Given by Other 02/12/24 1630)  lidocaine -EPINEPHrine -tetracaine  (LET) topical gel (3 mLs Topical Given 02/12/24 1548)  Tdap (BOOSTRIX ) injection 0.5 mL (0.5 mLs Intramuscular Given 02/12/24 1545)   IMPRESSION / MDM / ASSESSMENT AND PLAN / ED COURSE  I reviewed the triage vital signs and the nursing notes.  Clinical Course as of 02/12/24 2132  Thu Feb 12, 2024  1608 Discussed with orthopedics - recommend to defer to podiatry  [MH]  1613 DG Foot Complete Left Acute fracture base of the first distal phalanx [MH]  1937 Discussed with [place], DPM who recommend washout, suture, PO antibiotics augmentin  and doxy and cam boot for protection. F/U on Wednesday. [MH]    Clinical Course User Index [MH] Margrette Rebbeca LABOR, PA-C    53 y.o. male presents to the emergency department for evaluation and treatment of left great toe injury. See HPI for further details.   Differential diagnosis includes, but is not limited to fracture, dislocation, laceration, abrasion, CHF  Patient's presentation is most consistent with acute complicated illness / injury requiring diagnostic workup.  Tetanus administered.  Shared visit with Dr. Dorothyann.  Pending podiatry consult.  Given patient has desatted several times into the 80s and normally does not wear oxygen at home and is now at 93% on 3 L will obtain labs and consider admission for generalized weakness and bilateral leg swelling.  Given patient's amputated right foot he has concerns of difficult ambulation with cam boot to his left foot and increased leg swelling.  Discussed this with Dr. Malvin who will see patient inpatient if needed but will follow-up with patient on Wednesday in the [place] office.  Please see procedure note for laceration repair.  Discussed with hospitalist team who will admit patient for further workup and  treatment.   FINAL CLINICAL IMPRESSION(S) / ED DIAGNOSES   Final diagnoses:  None     Rx / DC Orders   ED Discharge Orders     None        Note:  This document was prepared using Dragon voice recognition software and may include unintentional dictation errors.    Margrette, Denys Labree A, PA-C 02/12/24 2135    Dorothyann Drivers, MD 02/15/24 1506

## 2024-02-12 NOTE — ED Triage Notes (Signed)
 Presents via EMS from home  States he was walking outside w/o shoes  Stepped in a hole   Bent his left great toe Large laceration noted below toe nail   18 g in  right hand NS 500 ml bolus Zofran  4 mg Fentanyl  200  given  Ancef  2 grams given

## 2024-02-12 NOTE — ED Notes (Signed)
 EDP at bedside

## 2024-02-12 NOTE — ED Provider Notes (Addendum)
-----------------------------------------   4:32 PM on 02/12/2024 ----------------------------------------- I have personally seen and evaluated the patient.  Patient has a laceration at the base of the left great toe which appears to cross the nailbed.  He does have a first distal phalanx fracture.  Podiatry was consulted we will washout and attempt to suture the laceration.  We will discharge with antibiotics and the patient will follow-up with podiatry.   ----------------------------------------- 7:52 PM on 02/12/2024 ----------------------------------------- Patient has desatted several times into the 80s.  Will increase oxygen.  Patient states he has been feeling very weak which is what he blames possibly a fall on.  He has increased swelling in his legs with generalized weakness.  Will check labs.  Given the patient's generalized weakness and increased edema and now intermittent hypoxia patient may require admission to the hospital service for further workup and treatment.    Dorothyann Drivers, MD 02/12/24 [phone]

## 2024-02-13 ENCOUNTER — Other Ambulatory Visit: Payer: Self-pay

## 2024-02-13 DIAGNOSIS — S92425B Nondisplaced fracture of distal phalanx of left great toe, initial encounter for open fracture: Secondary | ICD-10-CM | POA: Diagnosis not present

## 2024-02-13 DIAGNOSIS — I5022 Chronic systolic (congestive) heart failure: Secondary | ICD-10-CM | POA: Diagnosis not present

## 2024-02-13 DIAGNOSIS — R0902 Hypoxemia: Secondary | ICD-10-CM | POA: Diagnosis not present

## 2024-02-13 LAB — BASIC METABOLIC PANEL WITH GFR
Anion gap: 7 (ref 5–15)
BUN: 20 mg/dL (ref 6–20)
CO2: 26 mmol/L (ref 22–32)
Calcium: 8.3 mg/dL — ABNORMAL LOW (ref 8.9–10.3)
Chloride: 109 mmol/L (ref 98–111)
Creatinine, Ser: 0.98 mg/dL (ref 0.61–1.24)
GFR, Estimated: >60 mL/min (ref >60)
Glucose, Bld: 138 mg/dL — ABNORMAL HIGH (ref 70–99)
Potassium: 3.9 mmol/L (ref 3.5–5.1)
Sodium: 142 mmol/L (ref 135–145)

## 2024-02-13 LAB — CBC
HCT: 48.1 % (ref 39.0–52.0)
Hemoglobin: 15.6 g/dL (ref 13.0–17.0)
MCH: 31.1 pg (ref 26.0–34.0)
MCHC: 32.4 g/dL (ref 30.0–36.0)
MCV: 95.8 fL (ref 80.0–100.0)
Platelets: 118 10*3/uL — ABNORMAL LOW (ref 150–400)
RBC: 5.02 MIL/uL (ref 4.22–5.81)
RDW: 14.3 % (ref 11.5–15.5)
WBC: 9.4 10*3/uL (ref 4.0–10.5)
nRBC: 0 % (ref 0.0–0.2)

## 2024-02-13 LAB — MAGNESIUM: Magnesium: 1.8 mg/dL (ref 1.7–2.4)

## 2024-02-13 LAB — CBG MONITORING, ED: Glucose-Capillary: 141 mg/dL — ABNORMAL HIGH (ref 70–99)

## 2024-02-13 LAB — HIV ANTIBODY (ROUTINE TESTING W REFLEX): HIV Screen 4th Generation wRfx: NONREACTIVE

## 2024-02-13 MED ORDER — AMOXICILLIN-POT CLAVULANATE 875-125 MG PO TABS
1.0000 | ORAL_TABLET | Freq: Two times a day (BID) | ORAL | 0 refills | Status: DC
  Filled 2024-02-13: qty 14, 7d supply, fill #0

## 2024-02-13 MED ORDER — NICOTINE 21 MG/24HR TD PT24
21.0000 mg | MEDICATED_PATCH | Freq: Every day | TRANSDERMAL | 0 refills | Status: AC
  Filled 2024-02-13 (×2): qty 28, 28d supply, fill #0

## 2024-02-13 MED ORDER — DOXYCYCLINE HYCLATE 100 MG PO TABS
100.0000 mg | ORAL_TABLET | Freq: Two times a day (BID) | ORAL | 0 refills | Status: DC
  Filled 2024-02-13: qty 14, 7d supply, fill #0

## 2024-02-13 MED ORDER — MUPIROCIN 2 % EX OINT
TOPICAL_OINTMENT | Freq: Two times a day (BID) | CUTANEOUS | Status: DC
  Filled 2024-02-13: qty 22

## 2024-02-13 NOTE — Evaluation (Signed)
 Physical Therapy Evaluation Patient Details Name: [name]. MRN: [id number] DOB: [birth year] Today's Date: 02/13/2024  History of Present Illness  Pt is a 53 year old male presents with left great toe injury,X-ray of left foot showed acute fracture base of the first distal phalanx    found to have oxygen desaturation to 84% on room air, which has improved to 95% on 3 L oxygen    PMH significant for CHF with EF 40-45%, HTN, HLD, DM, CAD, STEMI, depression, BPH, esophageal perforation, C-spine myelopathy with incomplete quadriplegia C5/C6  Clinical Impression  Patient admitted with the above. PTA, patient lives with friend and was modI-I for mobility with no AD. Patient presents with impaired balance and decreased activity tolerance. Educated patient on use of post op shoe on L LE and limiting amount of weightbearing per MD, patient verbalized understanding. Required CGA-supervision for 100' ambulation with RW. Left seated EOB at end of session. Patient will benefit from skilled PT services during acute stay to address listed deficits.        If plan is discharge home, recommend the following: Assist for transportation;Help with stairs or ramp for entrance   Can travel by private vehicle        Equipment Recommendations Rolling Jaydyn Menon (2 wheels);BSC/3in1  Recommendations for Other Services       Functional Status Assessment Patient has had a recent decline in their functional status and demonstrates the ability to make significant improvements in function in a reasonable and predictable amount of time.     Precautions / Restrictions Precautions Precautions: Fall Recall of Precautions/Restrictions: Intact Restrictions Weight Bearing Restrictions Per Provider Order: Yes LLE Weight Bearing Per Provider Order: Weight bearing as tolerated Other Position/Activity Restrictions: per podiatry note  Weightbearing as tolerated in postop shoe      Mobility  Bed Mobility Overal bed  mobility: Modified Independent                  Transfers Overall transfer level: Needs assistance Equipment used: Rolling Emiyah Spraggins (2 wheels) Transfers: Sit to/from Stand Sit to Stand: Contact guard assist                Ambulation/Gait Ambulation/Gait assistance: Contact guard assist, Supervision Gait Distance (Feet): 100 Feet Assistive device: Rolling Muhsin Doris (2 wheels) Gait Pattern/deviations: Step-through pattern, Decreased stride length Gait velocity: decreased     General Gait Details: CGA progressing to supervision for ambulation in hallway with RW. Cues for utilizing BUE to offweight LLE with ambulation  Stairs            Wheelchair Mobility     Tilt Bed    Modified Rankin (Stroke Patients Only)       Balance Overall balance assessment: Needs assistance Sitting-balance support: Feet supported Sitting balance-Leahy Scale: Good     Standing balance support: Bilateral upper extremity supported, Reliant on assistive device for balance, During functional activity Standing balance-Leahy Scale: Fair                               Pertinent Vitals/Pain Pain Assessment Pain Assessment: No/denies pain    Home Living Family/patient expects to be discharged to:: Private residence Living Arrangements:  (friend) Available Help at Discharge: Friend(s);Available PRN/intermittently Type of Home: House Home Access: Level entry       Home Layout: One level Home Equipment: None      Prior Function Prior Level of Function : Independent/Modified Independent;History of Falls (  last six months)             Mobility Comments: amb with no AD ADLs Comments: generally MOD I-I in ADL/IADL; pt reports he does not drive     Extremity/Trunk Assessment   Upper Extremity Assessment Upper Extremity Assessment: Defer to OT evaluation RUE Deficits / Details: baseline RUE AROM deficits; pt does not not any new concerns, able to utilize it  functionally with compensatory techniques    Lower Extremity Assessment Lower Extremity Assessment: Generalized weakness RLE Deficits / Details: pt has baseline transmet amputation LLE Deficits / Details: in post op shoe    Cervical / Trunk Assessment Cervical / Trunk Assessment:  (pt with what looks like protruding hardware from fusion site- he reports he has just been to his neurosurgical follow up in May, this note is also seen in care everywhere by this therapist)  Communication   Communication Communication: No apparent difficulties    Cognition Arousal: Alert Behavior During Therapy: WFL for tasks assessed/performed   PT - Cognitive impairments: No apparent impairments                         Following commands: Intact       Cueing       General Comments General comments (skin integrity, edema, etc.): spo2 >90% on RA throughout    Exercises     Assessment/Plan    PT Assessment Patient needs continued PT services  PT Problem List Decreased strength;Decreased mobility;Decreased activity tolerance;Decreased balance;Decreased knowledge of use of DME;Decreased knowledge of precautions       PT Treatment Interventions DME instruction;Gait training    PT Goals (Current goals can be found in the Care Plan section)  Acute Rehab PT Goals Patient Stated Goal: to go hom e PT Goal Formulation: With patient Time For Goal Achievement: 02/27/24 Potential to Achieve Goals: Good    Frequency Min 1X/week     Co-evaluation   Reason for Co-Treatment: Necessary to address cognition/behavior during functional activity (imminent dc)           AM-PAC PT 6 Clicks Mobility  Outcome Measure Help needed turning from your back to your side while in a flat bed without using bedrails?: None Help needed moving from lying on your back to sitting on the side of a flat bed without using bedrails?: None Help needed moving to and from a bed to a chair (including a  wheelchair)?: A Little Help needed standing up from a chair using your arms (e.g., wheelchair or bedside chair)?: A Little Help needed to walk in hospital room?: A Little Help needed climbing 3-5 steps with a railing? : A Little 6 Click Score: 20    End of Session   Activity Tolerance: Patient tolerated treatment well Patient left: in bed;with call bell/phone within reach Nurse Communication: Mobility status PT Visit Diagnosis: Unsteadiness on feet (R26.81);Muscle weakness (generalized) (M62.81)    Time: 8993-8972 PT Time Calculation (min) (ACUTE ONLY): 21 min   Charges:   PT Evaluation $PT Eval Moderate Complexity: 1 Mod   PT General Charges $$ ACUTE PT VISIT: 1 Visit         Maryanne Finder, PT, DPT Physical Therapist - [facility]  [facility]   Tymara Saur A Davien Malone 02/13/2024, 12:16 PM

## 2024-02-13 NOTE — Consult Note (Signed)
 PODIATRY CONSULTATION  NAME [name]. MRN [id number] DOB 01/19/1971 DOA 02/12/2024   Reason for consult:  Chief Complaint  Patient presents with   Laceration    Attending/Consulting physician: CHARM Mana MD  History of present illness: [name]. is a 53 y.o. male with medical history significant of sCHF with EF 40-45%, HTN, HLD, DM, CAD, STEMI, depression, BPH, esophageal perforation, C-spine myelopathy with incomplete quadriplegia C5/C6, who presents with left great toe injury.     Pt states that he was walking outside w/o shoes, stepped in a hole, bent his left great toe, caused a large laceration to left great toe below toe nail. He has sharp pain in left great toe, which is constant, moderate to severe, nonradiating, aggravated by movement.  ED physician did washout and suture as recommended by Dr. Malvin of podiatry.  Bleeding is stopped.  Patient denies fall or any injury to head and neck. Patient reports twe episodes of diarrhea today, no nausea, vomiting or abdominal pain  Discussed the case with the ED PA yesterday evening and recommended irrigation with suture closure.  Patient was admitted due to oxygen desaturation.  Patient received 1 dose of Ancef  in ED.  Also received Tdap.  Yesterday the laceration was washed out thoroughly with saline and Betadine  per ED PA and sutured with nylon suture appreciate assistance.  Patient reports he has 8 out of 10 pain in the left great toe.  He does report a history of neuropathy due to diabetes as well as inability to dorsiflex the great toe due to prior back surgery.  He says he was walking barefoot in the yard which caused this to happen and he knows he should not do that but was not thinking.  Past Medical History:  Diagnosis Date   CHF (congestive heart failure) (HCC)    Coronary artery disease    Diabetes mellitus without complication (HCC)    Ischemic cardiomyopathy    Myocardial infarction [facility])    inferior  STEMI ~ 2016; 01/19/21 with CHB/cardiogenic shock requiring brief TVP, IABP, s/p aspiration thrombectomy/ballon angioplasty for late thrombosis mRCA stent & DES for acute occlusion mLCX       Latest Ref Rng & Units 02/13/2024    5:46 AM 02/12/2024    7:38 PM 10/31/2022    9:57 AM  CBC  WBC 4.0 - 10.5 K/uL 9.4  10.0  8.4   Hemoglobin 13.0 - 17.0 g/dL 84.3  84.3  86.2   Hematocrit 39.0 - 52.0 % 48.1  48.0  43.2   Platelets 150 - 400 K/uL 118  157  139        Latest Ref Rng & Units 02/13/2024    5:46 AM 02/12/2024    7:38 PM 06/13/2023    3:37 PM  BMP  Glucose 70 - 99 mg/dL 861  831  825   BUN 6 - 20 mg/dL 20  23  24    Creatinine 0.61 - 1.24 mg/dL 9.01  8.76  8.47   BUN/Creat Ratio 9 - 20   16   Sodium 135 - 145 mmol/L 142  141  138   Potassium 3.5 - 5.1 mmol/L 3.9  3.5  3.8   Chloride 98 - 111 mmol/L 109  105  95   CO2 22 - 32 mmol/L 26  25  27    Calcium  8.9 - 10.3 mg/dL 8.3  8.7  9.5       Physical Exam: Lower Extremity Exam Left hallux  with laceration dorsal aspect just proximal to the hallux nail base Erythema and edema of the left great toe Cap refill is intact less than 3 seconds to the distal tuft of the toe Sensation intact to pressure distal great toe though diminished due to neuropathy Able to plantarflex left hallux at MPJ, diminished dorsiflexion at MPJ though patient reports he has not been able to do that for a long time       ASSESSMENT/PLAN OF CARE 53 y.o. male with PMHx significant for  sCHF with EF 40-45%, HTN, HLD, DM, CAD, STEMI, depression, BPH, esophageal perforation, C-spine myelopathy with incomplete quadriplegia C5/C6  with traumatic open left great toe distal phalanx base fracture.  Status post bedside irrigation and suture repair by ED PA yesterday appreciate assistance.   - No surgical plans, stable for discharge.  I cleansed the left great toe with hydroperoxide and reapplied a new Xeroform 4 x 4 gauze Kerlix and Ace wrap dressing.  The laceration  appears stable after irrigation and suture repair per ED PA yesterday with good cap refill to the distal aspect of the hallux.  He does have some sensation to pressure.  Does have a history of neuropathy so sensory exam is not fully reliable.  Minimal dorsiflexion of the hallux though patient reports that he has not had this functional motion for a long time since he had prior back surgery.  Per ED provider who sutured the toe the EHL tendon did appear to be intact. - Recommend 7 days p.o. antibiotics on discharge Augmentin  and doxycycline  - Anticoagulation: None required - Wound care: Leave dressing I applied intact until he follows up next week Wednesday with Dr. Silva in [place] office, this has been scheduled - WB status: Weightbearing as tolerated in postop shoe which I applied, want him to elevate and stay off the foot is much as possible, okay to get crutches as well if easier for patient - Will sign off please message with questions or concerns   Thank you for the consult.  Please contact me directly with any questions or concerns.           Marolyn JULIANNA Honour, DPM [facility]    [address] [place], [place] [address]                Office [phone]  Fax [phone]

## 2024-02-13 NOTE — TOC Transition Note (Signed)
 Transition of Care [facility]) - Discharge Note   Patient Details  Name: [name]. MRN: [id number] Date of Birth: [birth year]  [facility] of [facility] CM/SW Contact:  Dalia GORMAN Fuse, RN Phone Number: 02/13/2024, 10:35 AM   Clinical Narrative:      PT/OT worked with the patient and recommended 2WW and BSC. Referral for DME: 2WW and BSC called to [employer] [phone], Thomasina accepted the referral. The equipment will be delivered to the patient's room.       Patient Goals and CMS Choice            Discharge Placement                       Discharge Plan and Services Additional resources added to the After Visit Summary for                                       Social Drivers of Health (SDOH) Interventions SDOH Screenings   Food Insecurity: Food Insecurity Present (01/26/2024)   Received from [facility]  Housing: High Risk (01/26/2024)   Received from [facility]  Transportation Needs: Unmet Transportation Needs (01/26/2024)   Received from [facility]  Utilities: At Risk (01/26/2024)   Received from [facility]  Depression [phone]): Low Risk  (03/07/2023)  Financial Resource Strain: High Risk (01/26/2024)   Received from [facility]  Tobacco Use: High Risk (02/12/2024)     Readmission Risk Interventions     No data to display

## 2024-02-13 NOTE — Consult Note (Signed)
 WOC Nurse Consult Note: patient with laceration to L great toe, sutured by ED physician with 13 sutures placed Reason for Consult: L great toe laceration  Wound type: full thickness sutured wound traumatic  Pressure Injury POA: NA  Measurement: see nursing flowsheet Wound bed: red moist sutured wound  Drainage (amount, consistency, odor) serosanguinous  Periwound: edema  Dressing procedure/placement/frequency: Cleanse L great toe wound with NS, apply Mupirocin  ointment to wound 2 times daily, cover with Telfa nonstick dressing and secure with dry gauze/Kerlix roll gauze and tape.    Patient should follow with podiatry as outpatient.  POC discussed with bedside nurse. WOC team will not follow. Re-consult if further needs arise.   Thank you,    Powell Bar MSN, RN-BC, [employer]

## 2024-02-13 NOTE — Evaluation (Signed)
 Occupational Therapy Evaluation Patient Details Name: [name]. MRN: [id number] DOB: 1971/04/21 Today's Date: 02/13/2024   History of Present Illness   Pt is a 53 year old male presents with left great toe injury,X-ray of left foot showed acute fracture base of the first distal phalanx    found to have oxygen desaturation to 84% on room air, which has improved to 95% on 3 L oxygen    PMH significant for CHF with EF 40-45%, HTN, HLD, DM, CAD, STEMI, depression, BPH, esophageal perforation, C-spine myelopathy with incomplete quadriplegia C5/C6     Clinical Impressions Chart reviewed, pt greeted in room, alert and oriented x4, agreeable to OT evaluation. PTA Pt reports he is generally MOD I-I in ADL/IADL (per chart does not drive), has assistance as needed from his friend he lives with. Pt presents with deficits in activity tolerance and balance as he has to amb with post op shoe. Pt performs bed mobility with MOD I, amb with RW approx 100' with supervision-CGA, LB dressing with MAX A but pt reports his friend can assist. Pt educated on safe ADL completion with AE/DME. Pt is left sitting on edge of bed, all needs met. OT will follow acutely to facilitate optimal ADL/functional mobility performance.      If plan is discharge home, recommend the following:   A little help with bathing/dressing/bathroom;Help with stairs or ramp for entrance;Assistance with cooking/housework;Direct supervision/assist for medications management     Functional Status Assessment   Patient has had a recent decline in their functional status and demonstrates the ability to make significant improvements in function in a reasonable and predictable amount of time.     Equipment Recommendations   BSC/3in1;Other (comment) (2WW)     Recommendations for Other Services         Precautions/Restrictions   Precautions Precautions: Fall Recall of Precautions/Restrictions: Intact Restrictions Weight  Bearing Restrictions Per Provider Order: Yes Other Position/Activity Restrictions: per podiatry note  Weightbearing as tolerated in postop shoe     Mobility Bed Mobility Overal bed mobility: Modified Independent                  Transfers Overall transfer level: Needs assistance Equipment used: Rolling walker (2 wheels) Transfers: Sit to/from Stand Sit to Stand: Contact guard assist                  Balance Overall balance assessment: Needs assistance Sitting-balance support: Feet supported Sitting balance-Leahy Scale: Good     Standing balance support: Bilateral upper extremity supported, Reliant on assistive device for balance, During functional activity Standing balance-Leahy Scale: Fair                             ADL either performed or assessed with clinical judgement   ADL Overall ADL's : Needs assistance/impaired Eating/Feeding: Set up;Sitting   Grooming: Set up;Standing           Upper Body Dressing : Supervision/safety Upper Body Dressing Details (indicate cue type and reason): doff shirt, donn gown Lower Body Dressing: Maximal assistance Lower Body Dressing Details (indicate cue type and reason): MAX A For post op shoe, pt reports his friend can help him Toilet Transfer: Supervision/safety;Contact guard assist;Rolling walker (2 wheels) Toilet Transfer Details (indicate cue type and reason): simulated         Functional mobility during ADLs: Supervision/safety;Contact guard assist;Rolling walker (2 wheels);Cueing for sequencing (approx 100' with RW)  Vision Patient Visual Report: No change from baseline       Perception         Praxis         Pertinent Vitals/Pain Pain Assessment Pain Assessment: No/denies pain     Extremity/Trunk Assessment Upper Extremity Assessment Upper Extremity Assessment: RUE deficits/detail (LUE grossly WFL) RUE Deficits / Details: baseline RUE AROM deficits; pt does not not any new  concerns, able to utilize it functionally with compensatory techniques   Lower Extremity Assessment Lower Extremity Assessment: Defer to PT evaluation;RLE deficits/detail;LLE deficits/detail RLE Deficits / Details: pt has baseline transmet amputation LLE Deficits / Details: in post op shoe   Cervical / Trunk Assessment Cervical / Trunk Assessment:  (pt with what looks like protruding hardware from fusion site- he reports he has just been to his neurosurgical follow up in May, this note is also seen in care everywhere by this therapist)   Communication Communication Communication: No apparent difficulties   Cognition Arousal: Alert Behavior During Therapy: [facility] for tasks assessed/performed Cognition: No apparent impairments                               Following commands: Intact       Cueing  General Comments   Cueing Techniques: Verbal cues;Tactile cues  spo2 >90% on RA throughout   Exercises Other Exercises Other Exercises: edu re: role of OT, role of rehab, discharge recommendations, safe ADL completion with DME/AE   Shoulder Instructions      Home Living Family/patient expects to be discharged to:: Private residence Living Arrangements:  (friend) Available Help at Discharge: Friend(s);Available PRN/intermittently Type of Home: House Home Access: Stairs to enter     Home Layout: One level     Bathroom Shower/Tub: Chief Strategy Officer: Standard     Home Equipment: None          Prior Functioning/Environment Prior Level of Function : Independent/Modified Independent;History of Falls (last six months)             Mobility Comments: amb with no AD ADLs Comments: generally MOD I-I in ADL/IADL; pt reports he does not drive    OT Problem List: Decreased strength;Decreased activity tolerance;Decreased knowledge of use of DME or AE;Impaired UE functional use;Decreased safety awareness;Impaired balance (sitting and/or  standing);Decreased cognition   OT Treatment/Interventions: Self-care/ADL training;DME and/or AE instruction;Therapeutic activities;Balance training;Therapeutic exercise;Energy conservation;Patient/family education      OT Goals(Current goals can be found in the care plan section)   Acute Rehab OT Goals Patient Stated Goal: go home OT Goal Formulation: With patient Time For Goal Achievement: 02/27/24 Potential to Achieve Goals: Good ADL Goals Pt Will Perform Grooming: with modified independence;sitting;standing Pt Will Perform Lower Body Dressing: with modified independence;sitting/lateral leans;sit to/from stand Pt Will Transfer to Toilet: with modified independence;ambulating Pt Will Perform Toileting - Clothing Manipulation and hygiene: with modified independence;sitting/lateral leans;sit to/from stand   OT Frequency:  Min 2X/week    Co-evaluation PT/OT/SLP Co-Evaluation/Treatment: Yes Reason for Co-Treatment: Necessary to address cognition/behavior during functional activity (imminent dc)          AM-PAC OT 6 Clicks Daily Activity     Outcome Measure Help from another person eating meals?: None Help from another person taking care of personal grooming?: None Help from another person toileting, which includes using toliet, bedpan, or urinal?: None Help from another person bathing (including washing, rinsing, drying)?: A Little Help from another person to  put on and taking off regular upper body clothing?: None Help from another person to put on and taking off regular lower body clothing?: A Little 6 Click Score: 22   End of Session Equipment Utilized During Treatment: Rolling walker (2 wheels) Nurse Communication: Mobility status  Activity Tolerance: Patient tolerated treatment well Patient left: with call bell/phone within reach;Other (comment) (sitting on edge of bed)  OT Visit Diagnosis: Other abnormalities of gait and mobility (R26.89)                Time:  8993-8974 OT Time Calculation (min): 19 min Charges:  OT General Charges $OT Visit: 1 Visit OT Evaluation $OT Eval Low Complexity: 1 Low  Therisa Sheffield, OTD OTR/L  02/13/24, 10:43 AM

## 2024-02-13 NOTE — Discharge Summary (Addendum)
 Physician Discharge Summary   Patient: [name]. MRN: [id number] DOB: 08/23/1970  Admit date:     02/12/2024  Discharge date: 02/13/24  Discharge Physician: Murvin Mana   PCP: Rosan Mix, MD   Recommendations at discharge:   Follow-up with PCP in 1 week. Follow-up with podiatry as scheduled.  Discharge Diagnoses: Principal Problem:   Oxygen desaturation Active Problems:   Chronic systolic CHF (congestive heart failure) (HCC)   Fracture of great toe, left, open   Essential hypertension   Hyperlipidemia associated with type 2 diabetes mellitus (HCC)   CAD (coronary artery disease)   Diarrhea   Tobacco abuse   Depression   Incomplete quadriplegia at C5-6 level (HCC)   Overweight (BMI 25.0-29.9)   Type 2 diabetes mellitus with peripheral neuropathy (HCC)  Resolved Problems:   * No resolved hospital problems. *  Hospital Course: [name]. is a 52 y.o. male with medical history significant of sCHF with EF 40-45%, HTN, HLD, DM, CAD, STEMI, depression, BPH, esophageal perforation, C-spine myelopathy with incomplete quadriplegia C5/C6, who presents with left great toe injury.     Assessment and Plan:  Oxygen desaturation Tobacco abuse with a likely COPD.:  Patient normally not using oxygen, but was found to have oxygen desaturation to 84% on room air, which has improved to 95% on 3 L oxygen.  Patient denies chest pain or SOB.  BNP not elevated.  He received IV Lasix , significant improved his oxygenation status.  He is off oxygen, walk with OT, did not have any desaturation.  No need for oxygen.   Chronic systolic CHF: 2D echo on 12/21/2021 showed EF of 40-45% with grade 1 diastolic dysfunction. Clinically, does not seem to have any exacerbation.  Resume home treatment and follow-up with PCP as outpatient.   Fracture of great toe, left, open: X-ray of left foot showed acute fracture base of the first distal phalanx.  Patient has been seen by podiatry, does  not need any surgery.  Cleared for discharge.  Will treat with antibiotics for for 7 days.  Scheduled to follow-up with podiatry next Wednesday.  Patient also has been seen by PT/OT, no home treatment needed.  Did prescribe bedside commode and a rolling walker.   Essential hypertension: Resume home medicines   Hyperlipidemia associated with type 2 diabetes mellitus (HCC) - Zetia  and Crestor    CAD (coronary artery disease): No chest pain - Crestor , Plavix , ASA   Diarrhea: Patient reports 2 episode of diarrhea earlier today.  No additional diarrhea.   Tobacco abuse -Nicotine  patch   Depression -Cymbalta    Incomplete quadriplegia at C5-6 level [facility]): Patient has intermittent spasm in the legs.  He is able to walk without using walker per pt. Resume home medicines.   Overweight (BMI 25.0-29.9): Body weight 93.4 kg, BMI 26.45 - Encourage losing weight - Exercise healthy diet   Diabetes mellitus with peripheral neuropathy [facility]): Recent A1c 8.7, poorly controlled.  Patient is taking Jardiance , glipizide  and metformin  at home. He was not taking insulin .  Resumed home medicines, adjust medication with PCP.   No notes have been filed under this hospital service. Service: Hospitalist        Consultants: Podiatry Procedures performed: None  Disposition: Home Diet recommendation:  Discharge Diet Orders (From admission, onward)     Start     Ordered   02/13/24 0000  Diet - low sodium heart healthy        02/13/24 1038  Cardiac diet DISCHARGE MEDICATION: Allergies as of 02/13/2024   No Known Allergies      Medication List     STOP taking these medications    insulin  glargine 100 UNIT/ML injection Commonly known as: LANTUS    insulin  lispro 100 UNIT/ML injection Commonly known as: HUMALOG    Lipitor  80 MG tablet Generic drug: atorvastatin    terbinafine 250 MG tablet Commonly known as: LAMISIL       TAKE these medications    acetaminophen  650 MG  CR tablet Commonly known as: TYLENOL  Take 1 tablet by mouth every 8 (eight) hours as needed for pain.   albuterol  108 (90 Base) MCG/ACT inhaler Commonly known as: VENTOLIN  HFA Inhale 2 puffs into the lungs every 6 (six) hours as needed for wheezing.   albuterol  (2.5 MG/3ML) 0.083% nebulizer solution Commonly known as: PROVENTIL  Take 3 mLs (2.5 mg total) by nebulization every 4 (four) hours as needed for wheezing or shortness of breath.   ammonium lactate 12 % lotion Commonly known as: LAC-HYDRIN Apply 1 Application topically as needed for dry skin.   amoxicillin -clavulanate 875-125 MG tablet Commonly known as: AUGMENTIN  Take 1 tablet by mouth every 12 (twelve) hours for 7 days.   aspirin  81 MG chewable tablet Chew 81 mg by mouth daily.   Baclofen  5 MG Tabs Take 15 mg by mouth 3 (three) times daily.   Biofreeze 4 % Gel Generic drug: Menthol  (Topical Analgesic) Apply topically every 8 (eight) hours as needed.   Biotene OralBalance Dry Mouth Gel Use as directed 1 application  in the mouth or throat as needed.   bismuth  subsalicylate 262 MG/15ML suspension Commonly known as: PEPTO BISMOL Take 30 mLs by mouth every 4 (four) hours as needed.   carboxymethylcellulose 0.5 % Soln Commonly known as: REFRESH PLUS Place 1 drop into both eyes 4 (four) times daily.   ciclopirox 0.77 % cream Commonly known as: LOPROX Apply 1 application  topically 2 (two) times daily.   clopidogrel  75 MG tablet Commonly known as: PLAVIX  Take 1 tablet (75 mg total) by mouth daily.   doxycycline  100 MG tablet Commonly known as: VIBRA -TABS Take 1 tablet (100 mg total) by mouth every 12 (twelve) hours for 7 days.   DULoxetine  60 MG capsule Commonly known as: CYMBALTA  Take 120 mg by mouth at bedtime.   empagliflozin  10 MG Tabs tablet Commonly known as: JARDIANCE  Take 10 mg by mouth daily.   ezetimibe  10 MG tablet Commonly known as: ZETIA  Take 10 mg by mouth daily.   ferrous sulfate  325  (65 FE) MG tablet Take 325 mg by mouth daily with breakfast.   fluticasone  50 MCG/ACT nasal spray Commonly known as: FLONASE  Place 1 spray into both nostrils daily as needed.   furosemide  40 MG tablet Commonly known as: LASIX  Take 1 tablet (40 mg total) by mouth daily as needed.   glipiZIDE  10 MG tablet Commonly known as: GLUCOTROL  Take 10 mg by mouth daily.   Glucagon Emergency 1 MG Kit Inject into the muscle as needed.   Insta-Glucose 77.4 % Gel Take 31 g by mouth.  Take 31 g (24 g of dextrose  total) by mouth every fifteen (15) minutes as needed for low blood sugar.   insulin  aspart 100 UNIT/ML injection Commonly known as: novoLOG  Inject 0-9 Units into the skin every 4 (four) hours.   levETIRAcetam  500 MG tablet Commonly known as: KEPPRA  Take 500 mg by mouth 2 (two) times daily.   loperamide  2 MG capsule Commonly known as: IMODIUM   Take 2 mg by mouth as needed for diarrhea or loose stools.   losartan  25 MG tablet Commonly known as: COZAAR  Take 0.5 tablets by mouth daily.   melatonin 3 MG Tabs tablet Take 3 mg by mouth at bedtime.   metFORMIN  1000 MG tablet Commonly known as: GLUCOPHAGE  Take 500 mg by mouth daily with breakfast.   nicotine  21 mg/24hr patch Commonly known as: NICODERM CQ  - dosed in mg/24 hours Place 1 patch (21 mg total) onto the skin daily. Start taking on: February 14, 2024   nitroGLYCERIN  0.4 MG SL tablet Commonly known as: NITROSTAT  Place 0.4 mg under the tongue every 5 (five) minutes as needed for chest pain.   Oxycodone  HCl 10 MG Tabs Take 5 mg by mouth every 4 (four) hours as needed.   polyethylene glycol 17 g packet Commonly known as: MIRALAX  / GLYCOLAX  Take 17 g by mouth daily as needed.   pregabalin  150 MG capsule Commonly known as: LYRICA  Take 150 mg by mouth 3 (three) times daily.   rosuvastatin  40 MG tablet Commonly known as: CRESTOR  Take 1 tablet (40 mg total) by mouth daily.   tamsulosin  0.4 MG Caps capsule Commonly known  as: FLOMAX  Take 0.4 mg by mouth at bedtime.   vitamin C 250 MG tablet Commonly known as: ASCORBIC ACID Take 250 mg by mouth daily.               Durable Medical Equipment  (From admission, onward)           Start     Ordered   02/13/24 1031  For home use only DME Bedside commode  Once       Question:  Patient needs a bedside commode to treat with the following condition  Answer:  Toe fracture   02/13/24 1030   02/13/24 0838  For home use only DME Walker  Once       Question:  Patient needs a walker to treat with the following condition  Answer:  Toe fracture   02/13/24 9161              Discharge Care Instructions  (From admission, onward)           Start     Ordered   02/13/24 0000  Discharge wound care:       Comments: Per Dr. Malvin:  Leave dressing I applied intact until he follows up next week Wednesday with Dr. Silva in [place] office, this has been scheduled   02/13/24 1038            Follow-up Information     Rosan Mix, MD Follow up in 1 week(s).   Specialty: Internal Medicine Contact information: [address] [place] [address] [phone]         Silva Juliene SAUNDERS, DPM Follow up in 1 week(s).   Specialty: Podiatry Contact information: [address] [place] [address] [phone]                Discharge Exam: Fredricka Weights   02/12/24 1428 02/12/24 1432  Weight: 94 kg 93.4 kg   General exam: Appears calm and comfortable  Respiratory system: Clear to auscultation. Respiratory effort normal. Cardiovascular system: S1 & S2 heard, RRR. No JVD, murmurs, rubs, gallops or clicks. No pedal edema. Gastrointestinal system: Abdomen is nondistended, soft and nontender. No organomegaly or masses felt. Normal bowel sounds heard. Central nervous system: Alert and oriented. No focal neurological deficits. Extremities: Left great  toe injury. Skin: No rashes, lesions or  ulcers Psychiatry: Judgement and insight appear normal. Mood & affect appropriate.    Condition at discharge: good  The results of significant diagnostics from this hospitalization (including imaging, microbiology, ancillary and laboratory) are listed below for reference.   Imaging Studies: DG Chest Port 1 View Result Date: 02/12/2024 CLINICAL DATA:  Oxygen desaturation.  Weakness. EXAM: PORTABLE CHEST 1 VIEW COMPARISON:  12/19/2021. FINDINGS: Patient is rotated to the right. The heart size is upper limits of normal. No focal consolidation, pleural effusion, or pneumothorax. No acute osseous abnormality. IMPRESSION: No acute cardiopulmonary findings. Electronically Signed   By: Harrietta Sherry M.D.   On: 02/12/2024 21:46   DG Foot Complete Left Result Date: 02/12/2024 CLINICAL DATA:  Fall with laceration to the great toe. EXAM: LEFT FOOT - COMPLETE 3+ VIEW COMPARISON:  None Available. FINDINGS: There is an acute oblique fracture through the base of the first distal phalanx. The main fracture fragment is oblique located medially with fracture fragments distracted 3 mm. There is no evidence for dislocation. Bandages overlie the first toe obscuring fine soft tissue detail. No definite radiopaque foreign body identified. IMPRESSION: Acute fracture base of the first distal phalanx. Electronically Signed   By: Greig Pique M.D.   On: 02/12/2024 15:25    Microbiology: Results for orders placed or performed during the hospital encounter of 02/12/24  Resp panel by RT-PCR (RSV, Flu A&B, Covid) Anterior Nasal Swab     Status: None   Collection Time: 02/12/24 10:11 PM   Specimen: Anterior Nasal Swab  Result Value Ref Range Status   SARS Coronavirus 2 by RT PCR NEGATIVE NEGATIVE Final    Comment: (NOTE) SARS-CoV-2 target nucleic acids are NOT DETECTED.  The SARS-CoV-2 RNA is generally detectable in upper respiratory specimens during the acute phase of infection. The lowest concentration of  SARS-CoV-2 viral copies this assay can detect is 138 copies/mL. A negative result does not preclude SARS-Cov-2 infection and should not be used as the sole basis for treatment or other patient management decisions. A negative result may occur with  improper specimen collection/handling, submission of specimen other than nasopharyngeal swab, presence of viral mutation(s) within the areas targeted by this assay, and inadequate number of viral copies(<138 copies/mL). A negative result must be combined with clinical observations, patient history, and epidemiological information. The expected result is Negative.  Fact Sheet for Patients:  BloggerCourse.com  Fact Sheet for Healthcare Providers:  SeriousBroker.it  This test is no t yet approved or cleared by the [place]  FDA and  has been authorized for detection and/or diagnosis of SARS-CoV-2 by FDA under an Emergency Use Authorization (EUA). This EUA will remain  in effect (meaning this test can be used) for the duration of the COVID-19 declaration under Section 564(b)(1) of the Act, 21 U.S.C.section 360bbb-3(b)(1), unless the authorization is terminated  or revoked sooner.       Influenza A by PCR NEGATIVE NEGATIVE Final   Influenza B by PCR NEGATIVE NEGATIVE Final    Comment: (NOTE) The Xpert Xpress SARS-CoV-2/FLU/RSV plus assay is intended as an aid in the diagnosis of influenza from Nasopharyngeal swab specimens and should not be used as a sole basis for treatment. Nasal washings and aspirates are unacceptable for Xpert Xpress SARS-CoV-2/FLU/RSV testing.  Fact Sheet for Patients: BloggerCourse.com  Fact Sheet for Healthcare Providers: SeriousBroker.it  This test is not yet approved or cleared by the [place]  FDA and has been authorized for detection and/or diagnosis of  SARS-CoV-2 by FDA under an Emergency Use  Authorization (EUA). This EUA will remain in effect (meaning this test can be used) for the duration of the COVID-19 declaration under Section 564(b)(1) of the Act, 21 U.S.C. section 360bbb-3(b)(1), unless the authorization is terminated or revoked.     Resp Syncytial Virus by PCR NEGATIVE NEGATIVE Final    Comment: (NOTE) Fact Sheet for Patients: BloggerCourse.com  Fact Sheet for Healthcare Providers: SeriousBroker.it  This test is not yet approved or cleared by the [place]  FDA and has been authorized for detection and/or diagnosis of SARS-CoV-2 by FDA under an Emergency Use Authorization (EUA). This EUA will remain in effect (meaning this test can be used) for the duration of the COVID-19 declaration under Section 564(b)(1) of the Act, 21 U.S.C. section 360bbb-3(b)(1), unless the authorization is terminated or revoked.  Performed at [facility], [address] [place], [place] [address]     Labs: CBC: Recent Labs  Lab 02/12/24 1938 02/13/24 0546  WBC 10.0 9.4  HGB 15.6 15.6  HCT 48.0 48.1  MCV 94.7 95.8  PLT 157 118*   Basic Metabolic Panel: Recent Labs  Lab 02/12/24 1938 02/13/24 0546  NA 141 142  K 3.5 3.9  CL 105 109  CO2 25 26  GLUCOSE 168* 138*  BUN 23* 20  CREATININE 1.23 0.98  CALCIUM  8.7* 8.3*  MG  --  1.8   Liver Function Tests: Recent Labs  Lab 02/12/24 1938  AST 35  ALT 44  ALKPHOS 276*  BILITOT 0.6  PROT 7.1  ALBUMIN  2.9*   CBG: Recent Labs  Lab 02/12/24 2146 02/13/24 0744  GLUCAP 132* 141*    Discharge time spent: 45 minutes.  Signed: Murvin Mana, MD [facility] 02/13/2024

## 2024-02-18 ENCOUNTER — Ambulatory Visit (INDEPENDENT_AMBULATORY_CARE_PROVIDER_SITE_OTHER): Payer: MEDICAID | Admitting: Podiatry

## 2024-02-18 ENCOUNTER — Other Ambulatory Visit: Payer: Self-pay

## 2024-02-18 DIAGNOSIS — S92425B Nondisplaced fracture of distal phalanx of left great toe, initial encounter for open fracture: Secondary | ICD-10-CM

## 2024-02-18 MED ORDER — DOXYCYCLINE HYCLATE 100 MG PO TABS
100.0000 mg | ORAL_TABLET | Freq: Two times a day (BID) | ORAL | 0 refills | Status: AC
  Filled 2024-02-18 – 2024-03-08 (×2): qty 14, 7d supply, fill #0

## 2024-02-18 MED ORDER — LEVOFLOXACIN 750 MG PO TABS
750.0000 mg | ORAL_TABLET | Freq: Every day | ORAL | 0 refills | Status: AC
  Filled 2024-02-18 – 2024-03-08 (×2): qty 7, 7d supply, fill #0

## 2024-02-19 ENCOUNTER — Encounter: Payer: Self-pay | Admitting: Podiatry

## 2024-02-19 NOTE — Progress Notes (Signed)
  Subjective:  Patient ID: Frank LITTIE Cathern Mickey., male    DOB: 1970-11-03,  MRN: [id number]  Chief Complaint  Patient presents with   Routine Post Op    Rm9  Follow up left hallux /swollen red and macerated around sutures/diabetic    53 y.o. male presents with the above complaint. History confirmed with patient.  Suffered an injury on 02/12/2024 with laceration and fracture of left great toe, laceration repaired in ER after irrigation and debridement has been weightbearing as tolerated in surgical shoe on the recommendation of Dr. Malvin since that time.  He is taking doxycycline  and Augmentin  notes redness and swelling  Objective:  Physical Exam: Edematous left hallux with erythema, well repaired laceration with sutures intact some maceration no active drainage  Previous x-rays 02/12/2024 showed left hallux distal phalanx phalangeal fracture with comminution but minimal displacement Assessment:   1. Open nondisplaced fracture of distal phalanx of left great toe, initial encounter      Plan:  Patient was evaluated and treated and all questions answered.  X-rays reviewed and his clinical progress compliant with treatment regimen so far I did note quite a bit of erythema still present to left great toe despite dual antibiotic therapy I recommend discontinuing the Augmentin  and switch to broad-spectrum treatment with Levaquin and doxycycline  will continue and refill of this was sent.  Return in 2 weeks to have new x-rays and suture removal.  His uncontrolled diabetes is likely to put him in a high risk of infection and toe amputation if this does not heal.  Return in about 2 weeks (around 03/03/2024) for fracture follow up new xrays, suture removal.

## 2024-03-02 ENCOUNTER — Other Ambulatory Visit: Payer: Self-pay

## 2024-03-03 ENCOUNTER — Ambulatory Visit: Payer: MEDICAID | Admitting: Podiatry

## 2024-03-08 ENCOUNTER — Other Ambulatory Visit: Payer: Self-pay

## 2024-03-08 ENCOUNTER — Other Ambulatory Visit (HOSPITAL_COMMUNITY): Payer: Self-pay

## 2024-03-10 ENCOUNTER — Ambulatory Visit: Admitting: Podiatry

## 2024-03-18 ENCOUNTER — Emergency Department

## 2024-03-18 ENCOUNTER — Emergency Department
Admission: EM | Admit: 2024-03-18 | Discharge: 2024-03-18 | Disposition: A | Discharge status: Home / Self Care | Attending: Emergency Medicine | Admitting: Emergency Medicine

## 2024-03-18 DIAGNOSIS — R208 Other disturbances of skin sensation: Secondary | ICD-10-CM | POA: Diagnosis not present

## 2024-03-18 DIAGNOSIS — L03115 Cellulitis of right lower limb: Secondary | ICD-10-CM | POA: Diagnosis not present

## 2024-03-18 DIAGNOSIS — M542 Cervicalgia: Secondary | ICD-10-CM | POA: Diagnosis present

## 2024-03-18 DIAGNOSIS — E119 Type 2 diabetes mellitus without complications: Secondary | ICD-10-CM | POA: Diagnosis not present

## 2024-03-18 DIAGNOSIS — I2510 Atherosclerotic heart disease of native coronary artery without angina pectoris: Secondary | ICD-10-CM | POA: Insufficient documentation

## 2024-03-18 DIAGNOSIS — R29898 Other symptoms and signs involving the musculoskeletal system: Secondary | ICD-10-CM

## 2024-03-18 DIAGNOSIS — M4803 Spinal stenosis, cervicothoracic region: Secondary | ICD-10-CM | POA: Diagnosis not present

## 2024-03-18 DIAGNOSIS — I509 Heart failure, unspecified: Secondary | ICD-10-CM | POA: Diagnosis not present

## 2024-03-18 LAB — CBC WITH DIFFERENTIAL/PLATELET
Abs Granulocyte: 5.9 K/uL (ref 1.5–6.5)
Abs Immature Granulocytes: 0.05 K/uL (ref 0.00–0.07)
Basophils Absolute: 0.1 K/uL (ref 0.0–0.1)
Basophils Relative: 1 %
Eosinophils Absolute: 0.2 K/uL (ref 0.0–0.5)
Eosinophils Relative: 2 %
HCT: 51.4 % (ref 39.0–52.0)
Hemoglobin: 17.2 g/dL — ABNORMAL HIGH (ref 13.0–17.0)
Immature Granulocytes: 1 %
Lymphocytes Relative: 25 %
Lymphs Abs: 2.2 K/uL (ref 0.7–4.0)
MCH: 31.7 pg (ref 26.0–34.0)
MCHC: 33.5 g/dL (ref 30.0–36.0)
MCV: 94.7 fL (ref 80.0–100.0)
Monocytes Absolute: 0.5 K/uL (ref 0.1–1.0)
Monocytes Relative: 5 %
Neutro Abs: 5.9 K/uL (ref 1.7–7.7)
Neutrophils Relative %: 66 %
Platelets: 116 K/uL — ABNORMAL LOW (ref 150–400)
RBC: 5.43 MIL/uL (ref 4.22–5.81)
RDW: 13.5 % (ref 11.5–15.5)
WBC: 8.8 K/uL (ref 4.0–10.5)
nRBC: 0 % (ref 0.0–0.2)

## 2024-03-18 LAB — PROTIME-INR
INR: 0.9 (ref 0.8–1.2)
Prothrombin Time: 13.1 s (ref 11.4–15.2)

## 2024-03-18 LAB — COMPREHENSIVE METABOLIC PANEL WITH GFR
ALT: 23 U/L (ref 0–44)
AST: 31 U/L (ref 15–41)
Albumin: 2.9 g/dL — ABNORMAL LOW (ref 3.5–5.0)
Alkaline Phosphatase: 188 U/L — ABNORMAL HIGH (ref 38–126)
Anion gap: 9 (ref 5–15)
BUN: 18 mg/dL (ref 6–20)
CO2: 25 mmol/L (ref 22–32)
Calcium: 8.7 mg/dL — ABNORMAL LOW (ref 8.9–10.3)
Chloride: 101 mmol/L (ref 98–111)
Creatinine, Ser: 1.11 mg/dL (ref 0.61–1.24)
GFR, Estimated: >60 mL/min (ref >60)
Glucose, Bld: 363 mg/dL — ABNORMAL HIGH (ref 70–99)
Potassium: 4 mmol/L (ref 3.5–5.1)
Sodium: 135 mmol/L (ref 135–145)
Total Bilirubin: 0.2 mg/dL (ref 0.0–1.2)
Total Protein: 6.7 g/dL (ref 6.5–8.1)

## 2024-03-18 LAB — ETHANOL: Alcohol, Ethyl (B): <15 mg/dL (ref <15)

## 2024-03-18 MED ORDER — INSULIN ASPART 100 UNIT/ML IJ SOLN
5.0000 [IU] | Freq: Once | INTRAMUSCULAR | Status: AC
  Administered 2024-03-18: 5 [IU] via SUBCUTANEOUS
  Filled 2024-03-18: qty 1

## 2024-03-18 MED ORDER — SODIUM CHLORIDE 0.9 % IV SOLN
1.0000 g | Freq: Once | INTRAVENOUS | Status: AC
  Administered 2024-03-18: 1 g via INTRAVENOUS
  Filled 2024-03-18: qty 10

## 2024-03-18 MED ORDER — SODIUM CHLORIDE 0.9 % IV SOLN
100.0000 mg | Freq: Once | INTRAVENOUS | Status: AC
  Administered 2024-03-18: 100 mg via INTRAVENOUS
  Filled 2024-03-18: qty 100

## 2024-03-18 MED ORDER — DOXYCYCLINE HYCLATE 100 MG PO TABS: 100.0000 mg | ORAL_TABLET | Freq: Two times a day (BID) | ORAL | 0 refills | Status: AC

## 2024-03-18 MED ORDER — CEFDINIR 300 MG PO CAPS
300.0000 mg | ORAL_CAPSULE | Freq: Two times a day (BID) | ORAL | 0 refills | Status: AC
Start: 2024-03-18 — End: 2024-03-28

## 2024-03-18 NOTE — ED Triage Notes (Signed)
 Pt to ED from home with left arm numbness since 0500 this AM. Pt has hx of spinal surgery in 2023. Denies changes in speech, facial paralyses, or other neurological deficits, trauma.  A&O x4, LKW 1200  Hx right foot amputation

## 2024-03-18 NOTE — Discharge Instructions (Addendum)
 Please make sure to follow-up with your neurosurgeon for your spinal stenosis and left arm weakness and decreased sensation.  If you have worsening weakness, numbness, imbalance, severe back pain, urinary incontinence, numbness to your groin, please return to this emergency department.  Please take the antibiotics as prescribed for your cellulitis.  Please be sure to follow-up with your primary care doctor to get reassessed early next week to make sure that the antibiotics are working for a cellulitis.

## 2024-03-18 NOTE — ED Notes (Signed)
 Patient ambulated without assistance and A&O x4

## 2024-03-18 NOTE — Inpatient Diabetes Management (Signed)
 Inpatient Diabetes Program Recommendations  AACE/ADA: New Consensus Statement on Inpatient Glycemic Control (2015)  Target Ranges:  Prepandial:   less than 140 mg/dL      Peak postprandial:   less than 180 mg/dL (1-2 hours)      Critically ill patients:  140 - 180 mg/dL   Lab Results  Component Value Date   GLUCAP 141 (H) 02/13/2024   HGBA1C 8.7 (H) 12/19/2021    Review of Glycemic Control  Latest Reference Range & Units 03/18/24 06:32  Glucose 70 - 99 mg/dL 636 (H)   Diabetes history: DM 2 Outpatient Diabetes medications: Jardiance  10 mg Daily, Glipizide  10 mg Daily, Metformin  500 mg Daily Current orders for Inpatient glycemic control:  None being evaluated in the ED Was administered 5 units of Novolog  x1  A1c 8.7% on 5/3  Inpatient Diabetes Program Recommendations:    -   consider CBGs and Novolog  0-15 units Q4 while NPO.  Thanks,  Clotilda Bull RN, MSN, BC-ADM Inpatient Diabetes Coordinator Team Pager [phone] (8a-5p)

## 2024-03-18 NOTE — ED Provider Triage Note (Signed)
 Emergency Medicine Provider Triage Evaluation Note  [name]. , a 53 y.o. male  was evaluated in triage.  Pt complains of acute onset left hand weakness.  Patient went to bed around midnight and woke up with decreased strength in his left hand.  Says it feels similar to when he had an issue with his cervical spine where he could not move his arm at all and required multiple surgeries.  This was a few years ago.  He has no numbness or tingling.  Review of Systems  Positive: Decreased grip strength in the left hand but is able to raise left arm fully above his head and hold it there against gravity. Negative: Denies visual changes and has no aphasia nor dysarthria and no neglect.  No fever, no headache, no neck pain.  No chest pain or abdominal pain.  Physical Exam  Ht 1.88 m (6' 2)   Wt 90.3 kg   BMI 25.55 kg/m  Gen:   Awake, no distress   Resp:  Normal effort  MSK:   Erythema and swelling of right lower extremity with a history of partial foot amputation Other:  Decreased left-sided grip strength compared to the right but patient is otherwise able to hold his arm up against gravity.  No subjective numbness or tingling.  NIH stroke scale of 1.  Medical Decision Making  Medically screening exam initiated at 6:32 AM.  Appropriate orders placed.  [name]. was informed that the remainder of the evaluation will be completed by another provider, this initial triage assessment does not replace that evaluation, and the importance of remaining in the ED until their evaluation is complete.  Considered activating code stroke, but the patient's last known normal was midnight so he is at 6.5 hours after last known normal and outside the window for thrombolytics.  He is VAN- and extremely unlikely to be suffering from an LVO.  Given his history a cervical spine issue is much more likely.  I am ordering head CT Noncon and cervical spine CT as well as initial blood work to begin the  patient's workup, but at this point I do not feel he meets criteria for code stroke initiation   Gordan Huxley, MD 03/18/24 [id number]

## 2024-03-18 NOTE — ED Provider Notes (Signed)
 [facility] Provider Note    Event Date/Time   First MD Initiated Contact with Patient 03/18/24 [phone]     (approximate)   History   Numbness   HPI  [name]. is a 53 y.o. male with CHF, CAD, diabetes, also prior history of a spinal fusion from C4-T1 and cervical myelopathy, has residual numbness and mild weakness to his right upper extremity, presenting with left upper extremity weakness and numbness.  States that he noticed it when he woke up today at 5 AM.  Last known well was at midnight when he went to bed.  States that prior to that he was feeling at his baseline.  Did note that he fell 3 days ago, when he lost his balance and fell onto his legs when turning to sit in a chair.  Sustained some abrasions to his right anterior shin, noted 2 days ago that he was having redness to his shin.  No purulent drainage, no fever.  No new weakness or numbness to his lower extremities.  He denies any saddle anesthesia or incontinence or retention.  No urinary symptoms, nausea, vomiting, diarrhea, chest pain, shortness of breath, abdominal pain.  No pain to his upper extremities.  States that he does have some neck pain from sleeping wrongly but denies midline spinal tenderness.  No headache or vision changes.  No slurred speech or facial droop.  He denies prior history of CVA.  On independent chart review, he was seen by podiatry in July for follow-up after open fracture of his left great toe.  During that visit they switched his antibiotics to Levaquin  and doxycycline .  He was last seen by neurosurgery at [facility] in May where they noted a satisfactory fusion at the C4-T1, repeat imaging at that time was stable.  His last Tdap was in 2025.     Physical Exam   Triage Vital Signs: ED Triage Vitals  Encounter Vitals Group     BP 03/18/24 0630 133/88     Girls Systolic BP Percentile --      Girls Diastolic BP Percentile --      Boys Systolic BP Percentile --      Boys  Diastolic BP Percentile --      Pulse Rate 03/18/24 0630 100     Resp 03/18/24 0630 16     Temp 03/18/24 0632 97.7 F (36.5 C)     Temp Source 03/18/24 0632 Oral     SpO2 03/18/24 0630 98 %     Weight 03/18/24 0630 199 lb (90.3 kg)     Height 03/18/24 0630 6' 2 (1.88 m)     Head Circumference --      Peak Flow --      Pain Score 03/18/24 0630 0     Pain Loc --      Pain Education --      Exclude from Growth Chart --     Most recent vital signs: Vitals:   03/18/24 0930 03/18/24 1103  BP: 125/83   Pulse: 70   Resp:    Temp:  98.1 F (36.7 C)  SpO2: 94%      General: Awake, no distress.  CV:  Good peripheral perfusion.  Resp:  Normal effort.  Abd:  No distention.  Soft nontender Other:  Pupils are equal and reactive, extraocular movements are intact, no sensory deficits to his face, no cranial nerve deficits, he has mild decreased sensation to his left upper extremity  from the elbow down to his fingers, sensation intact to his shoulder and upper arm, has mild decreased grip strength, weakness when extending his L wrist, this is all in comparison to the right upper extremity.  No dysmetria.  No focal weakness or numbness to his bilateral lower extremities, no saddle anesthesia, no midline spinal tenderness.  He does have healing scabs on his right shin, there is surrounding erythema, his lower extremities appears symmetrical, he has no posterior calf tenderness, he has equal DP pulses bilaterally, right midfoot amputation noted, left foot with sutures to the left great toe that are intact, there is no surrounding erythema or induration or purulent drainage to the surgical site.   ED Results / Procedures / Treatments   Labs (all labs ordered are listed, but only abnormal results are displayed) Labs Reviewed  COMPREHENSIVE METABOLIC PANEL WITH GFR - Abnormal; Notable for the following components:      Result Value   Glucose, Bld 363 (*)    Calcium  8.7 (*)    Albumin  2.9 (*)     Alkaline Phosphatase 188 (*)    All other components within normal limits  CBC WITH DIFFERENTIAL/PLATELET - Abnormal; Notable for the following components:   Hemoglobin 17.2 (*)    Platelets 116 (*)    All other components within normal limits  ETHANOL  PROTIME-INR     EKG  EKG shows, sinus rhythm, reassigning to, normal QRS, normal QTc, no obvious ischemic ST elevation, not significant change compared to prior   RADIOLOGY On my independent interpretation, CT head without obvious intracranial hemorrhage   PROCEDURES:  Critical Care performed: No  Procedures   MEDICATIONS ORDERED IN ED: Medications  doxycycline  (VIBRAMYCIN ) 100 mg in sodium chloride  0.9 % 250 mL IVPB (0 mg Intravenous Stopped 03/18/24 1216)  cefTRIAXone  (ROCEPHIN ) 1 g in sodium chloride  0.9 % 100 mL IVPB (0 g Intravenous Stopped 03/18/24 1102)  insulin  aspart (novoLOG ) injection 5 Units (5 Units Subcutaneous Given 03/18/24 0831)     IMPRESSION / MDM / ASSESSMENT AND PLAN / ED COURSE  I reviewed the triage vital signs and the nursing notes.                              Differential diagnosis includes, but is not limited to, CVA, TIA, cervical myelopathy, disc herniation, radiculopathy, considered cauda equina but he has no incontinence, no saddle anesthesia, for the leg, patient has no bony tenderness but he does have some erythema around the scabs on his shin, consider cellulitis, he has no pain or proportion, no crepitus, doubt deeper space infection at this time.  The erythema is just on his shin, is not circumferential, there is no lower extremity asymmetry, no calf tenderness, doubt DVT at this time.  Will get labs, CT head, cervical spine.  Stroke alert was not activated given that his last known well was at midnight and he is outside the tPA window.  Only deficits are decreased sensation to the left upper extremity from the elbow down as well as decreased strength, doubt LVO at this time.  For the  infection we will start him on antibiotics.  He received his Tdap during his last admission in 2025.  Patient's presentation is most consistent with acute presentation with potential threat to life or bodily function.  Independent interpretation of labs and imaging below.  No acute infarcts on MRI brain, his MRI of the cervical spine did show stenosis  and spinal cord mass effect but no definite spinal cord signals, thoracic spine MRI showed disc degeneration with mild thoracic cord mass effect without definite spinal cord signals.  Discussed with neurosurgery here who states that if he is not rapidly worsening and has no balance issues, he is okay to follow-up outpatient with his neurosurgeon at [facility].  If he would like to switch over to elements he is happy to see him as well.  On reassessment patient feels well, states that his numbness and grip strength feels like they are improving slightly.  Denies any issues with walking, does use a cane at baseline.  Has steady gait with ambulation in emergency department.  Wants to follow-up with his neurosurgeon at [facility].  Considered but no indication for inpatient admission at this time he safe for outpatient management.  Will send prescriptions for cellulitis to his pharmacy that he can take outpatient.  Instructed him to also follow-up with his primary care doctor early next week to get his cellulitis reassessed.  Will discharge with strict return precautions.  The patient is on the cardiac monitor to evaluate for evidence of arrhythmia and/or significant heart rate changes.   Clinical Course as of 03/18/24 1217  Thu Mar 18, 2024  9252 CT Cervical Spine Wo Contrast IMPRESSION: Interval  1. Cervical spine fusion from C3 to the T2 level. Solid interbody arthrodesis C4 through T1. Absent arthrodesis at C3-C4, with left laminar screw loosening at C3. Absent arthrodesis at T1-T2 without pedicle screw loosening. 2. Residual multifactorial cervical spinal  stenosis suspected, in large part due to short pedicle distance. And cervical spinal cord myelomalacia was visible on 2022 MRI. 3. No acute osseous abnormality identified. 4.  Emphysema (ICD10-J43.9).   [TT]  0747 CT HEAD WO CONTRAST IMPRESSION: 1. No acute intracranial abnormality. 2. Mild for age cerebral white matter changes, most commonly due to small vessel disease.   [TT]  [phone] Independent review of labs, ethanol levels are elevated, electrolytes are severely deranged, he does have elevated alk phos, he has been elevated in the past, rest of the LFTs are normal, this is nonspecific, he has abdominal pain or jaundice, no indication for right upper quadrant imaging at this time.  No leukocytosis.  His glucose is elevated, no evidence of DKA.  Potassium is 4, will give him 5 units of subcu insulin  here. [TT]  1049 MR BRAIN WO CONTRAST IMPRESSION: 1. No acute intracranial abnormality. 2. Mild to moderate for age signal changes in the brainstem and cerebral white matter, nonspecific but most commonly due to chronic small vessel disease.   [TT]  1119 MR THORACIC SPINE WO CONTRAST IMPRESSION: 1. Upper thoracic postoperative changes with no adverse features. Only mild adjacent segment disease at T2-T3. 2. Extensive Thoracic Epidural Lipomatosis results in widespread mild thoracic spinal stenosis. 3. Superimposed bulky thoracic disc degeneration at T6-T7, disc and posterior element degeneration at T10-T11 exacerbating spinal stenosis at those levels. Up to mild associated thoracic cord mass effect at those levels. No convincing cord signal abnormality (cord detail suboptimal due to motion artifact). 4. Degenerative moderate to severe neural foraminal stenosis at the bilateral T10, left T11 nerve levels.   [TT]  1119 MR Cervical Spine Wo Contrast IMPRESSION: 1. 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 Since previous 2022 MRI satisfactory decompression and fusion  of C4 through C7 with resolved spinal stenosis at those levels, and also at C7-T1 with regressed although not completely resolved spinal stenosis there. 2. Bulky adjacent segment disease at  C3-C4 with moderate to severe spinal stenosis and spinal cord mass effect. No definite spinal cord signal changes there although occult myelomalacia is a concern. Associated moderate to severe biforaminal stenosis. 3. Borderline to mild new spinal stenosis at C2-C3 without cord mass effect. 4. Brain and Thoracic MRI reported separately.   [TT]    Clinical Course User Index [TT] Waymond Lorelle Cummins, MD     FINAL CLINICAL IMPRESSION(S) / ED DIAGNOSES   Final diagnoses:  Decreased sensation  Left arm weakness  Spinal stenosis of cervicothoracic region  Cellulitis of right lower extremity     Rx / DC Orders   ED Discharge Orders          Ordered    doxycycline  (VIBRA -TABS) 100 MG tablet  2 times daily        03/18/24 1158    cefdinir  (OMNICEF ) 300 MG capsule  2 times daily        03/18/24 1158             Note:  This document was prepared using Dragon voice recognition software and may include unintentional dictation errors.    Waymond Lorelle Cummins, MD 03/18/24 [phone]

## 2024-03-23 NOTE — Progress Notes (Addendum)
 NEUROSURGERY CLINIC NOTE ESTABLISHED PATIENT  Assessment and Recommendations [name] is a 53 y.o. 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 On his most recent imaging, he does have stenosis at C3-4 and loosening of the C3 screws. Clinically he is having frequent falls which could be evidence of myelopathy however left wrist weakness may be a more peripheral etiology  - Will plan for C3-4 revision laminectomy and fusion - Will discuss case and refer patient to Dr. Catarino in plastic surgery for assistance with wound closure - Referral to PT for left wrist weakness  Encounter diagnoses  Diagnosis ICD-10-CM Associated Orders  1. Cervical spondylosis with myelopathy  M47.12 Ambulatory referral to Physical Therapy    Ambulatory referral to Plastic Surgery       Interval history [name] is a 53 y.o. male who returns to clinic today for follow up. ~5 days ago he felt his left wrist give out when he put weight on it and since then has had difficulty moving his wrist. Has had difficulty holding objects as well as numbness from the forearm into the hand. He has fallen twice in the last week.   Review of Systems A 10-system review of systems was conducted and was negative except as documented above in the HPI.  Physical Exam  Vital Signs:  Temp 36.3 C (97.4 F) (Temporal)   Ht 188 cm (6' 2)   Wt 95.7 kg (211 lb)   BMI 27.09 kg/m  General: No acute distress, Body mass index is 27.09 kg/m.  Neurological Exam: EOSp RUE 4-4+ d/b/t/wf/hg, 2-3 wrist extension, able to extend wrist to neutral but not further BLE 5 hf/ke/pf/df/ehl No hoffman's No hyperreflexia No clonus  Cardiovascular:  Warm and well perfused  with good pulses, no edema Pulmonary:  Unlabored breathing, no pursed lips or wheezing, acyanotic Abdomen:  Soft, nontender, nondistended Skin:  No petechiae, rashes or ecchymoses       Neurological imaging reviewed MRI C spine and CT C spine from OSH 03/18/24 reviewed as above.   ---Historical data---  Allergies Aspirin   Medications   Medications reviewed in Epic.  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 CAD (coronary artery disease)   . Chest pain   . Diabetes mellitus     . HFrEF (heart failure with reduced ejection fraction)   01/19/2021  . Hyperlipidemia   . Hypertension   . Myocardial infarction     . SOB (shortness of breath)   . Substance abuse (CMS-HCC)   [2] Past Surgical History: Procedure Laterality Date  . BACK SURGERY  08/19/2006  . CORONARY STENT PLACEMENT    . FOOT AMPUTATION THROUGH METATARSAL Right 10/11/2020   Jolynn Pack  . NECK SURGERY    . PR ALLOGRAFT FOR SPINE SURGERY ONLY MORSELIZED N/A 05/16/2022   Procedure: ALLOGRAFT FOR SPINE SURGERY ONLY; MORSELIZED;  Surgeon: Rocco Weyman Jaffe, MD;  Location: MAIN OR [facility];  Service:  Neurosurgery  . PR ALLOGRAFT FOR SPINE SURGERY ONLY STRUCTURAL N/A 05/16/2022   Procedure: ALLOGRAFT FOR SPINE SURGERY ONLY; STRUCTURAL;  Surgeon: Rocco Organ Dipakkumar, MD;  Location: MAIN OR [facility];  Service: Neurosurgery  . PR ARTHRD PST/PSTLAT TQ 1NTRSPC CRV BELW C2 SEGMENT N/A 05/16/2022   Procedure: ARTHRODESIS, POSTERIOR OR POSTEROLATERAL TECHNIQUE, SINGLE INTERSPACE; CERVICAL BELOW C2 SEGMENT;  Surgeon: Rocco Organ Jaffe, MD;   Location: MAIN OR [facility];  Service: Neurosurgery  . PR ARTHRODESIS PST/PSTLAT TQ 1NTRSPC EA ADDL NTRSPC N/A 05/16/2022   Procedure: MIN INV ARTHRODESIS, POSTERIOR OR POSTEROLATERAL TECHNIQUE, SINGLE INTERSPACE; EACH ADDITIONAL INTERSPACE (LIST SEPARATELY IN ADDITION TO CODE FOR PRIMARY PROCEDURE);  Surgeon: Rocco Organ Jaffe, MD;  Location: MAIN OR [facility];  Service: Neurosurgery  . PR ESOPHAGOSCOPY,DIAGNOSTIC N/A 01/07/2022   Procedure: ESOPHAGOSCOPY, RIGID OR FLEXIBLE; DIAGNOSTIC, W/WO COLLECTION OF SPECIMEN(S) BY BRUSHING OR WASHING;  Surgeon: Reyes Gladis Ovens, MD;  Location: MAIN OR [facility];  Service: ENT  . PR EXPLOR POSTOP BLEED,INFEC,CLOT-CHST N/A 12/20/2021   Procedure: EXPLOR POSTOP HEMORR THROMBOSIS/INFEC; CHEST;  Surgeon: Allyne Michelina Render, MD;  Location: MAIN OR [facility];  Service: Thoracic  . PR EXPLOR POSTOP BLEED,INFEC,CLOT-NECK Right 12/20/2021   Procedure: EXPLORATION FOR POSTOPERATIVE HEMORRHAGE, THROMBOSIS OR INFECTION; NECK;  Surgeon: Reyes Gladis Ovens, MD;  Location: MAIN OR [facility];  Service: ENT  . PR EXPLOR POSTOP BLEED,INFEC,CLOT-NECK Right 12/23/2021   Procedure: EXPLORATION FOR POSTOPERATIVE HEMORRHAGE, THROMBOSIS OR INFECTION; NECK;  Surgeon: Silver Elnor Daisy, MD;  Location: MAIN OR [facility];  Service: ENT  . PR EXPLOR POSTOP BLEED,INFEC,CLOT-NECK N/A 12/25/2021   Procedure: EXPLORATION FOR POSTOPERATIVE HEMORRHAGE, THROMBOSIS OR INFECTION; NECK;  Surgeon: Reyes Gladis Ovens, MD;  Location: MAIN OR [facility];  Service: ENT  . PR IONM 1 ON 1 IN OR W/ATTENDANCE EACH 15 MINUTES N/A 05/16/2022   Procedure: CONTINUOUS INTRAOPERATIVE NEUROPHYSIOLOGY MONITORING IN OR;  Surgeon: Rocco Organ Dipakkumar, MD;  Location: MAIN OR [facility];  Service: Neurosurgery  . PR LAM FACETECTOMY&FORAMOT 1 VRT SGM EA ADDL SGM N/A 05/16/2022   Procedure: LAMINECTOMY, FACETECTOMY AND FORAMINOTOMY (UNI/BI W DECOMP OF SPINAL CORD, CAUDA EQUINA AND/ NERVE ROOT'S), SNGLE VERTEBRAL SEG; EACH ADDTL  VERTEBRAL SEG, CERVICAL, THORACIC, OR LUM (ADD TO PRIM PROC);  Surgeon: Rocco Organ Jaffe, MD;  Location: MAIN OR [facility];  Service: Neurosurgery  . PR LAMINEC/FACETECT/FORAMIN,CERVICAL 1 SEG N/A 05/16/2022   Procedure: LAMINECTOMY SNGL VERTEBRAL SEGMT-UNI/BIL; CERV;  Surgeon: Rocco Organ Dipakkumar, MD;  Location: MAIN OR [facility];  Service: Neurosurgery  . PR MICROSURG TECHNIQUES,REQ OPER MICROSCOPE N/A 05/16/2022   Procedure: MICROSURGICAL TECHNIQUES, REQUIRING USE OF OPERATING MICROSCOPE (LIST SEPARATELY IN ADDITION TO CODE FOR PRIMARY PROCEDURE);  Surgeon: Rocco Organ Jaffe, MD;  Location: MAIN OR [facility];  Service: Neurosurgery  . PR PLACE PERCUT GASTROSTOMY TUBE N/A 01/07/2022   Procedure: UGI ENDO; W/DIRECTED PLCMT PERQ GASTROSTOMY TUBE;  Surgeon: Allyne Michelina Render, MD;  Location: MAIN OR [facility];  Service: Thoracic  . PR POSTERIOR SEGMENTAL INSTRUMENTATION 7-12 VRT SEG N/A 05/16/2022   Procedure: POSTERIOR SEGMENTAL INSTRUMENTATION; (EG, PEDICLE FIXATION, DUAL RODS W/MULT HOOKS/WIRES) 7-12 VERTEB SEGMT;  Surgeon: Rocco Organ Dipakkumar, MD;  Location: MAIN OR [facility];  Service: Neurosurgery  . PR REMOVAL IMPLANT DEEP Right 12/20/2021   Procedure: HARDWARE REMOVAL SPINE;  Surgeon: Organ Jaffe Rocco, MD;  Location: MAIN OR [facility];  Service: Neurosurgery  . PR REVISE ULNAR NERVE AT ELBOW Right 04/01/2023   Procedure: Ulnar nerve decompression and possible transposition;  Surgeon: Evelena Oneil Barter, MD;  Location: OR [facility];  Service: Neurosurgery  .  PR STEREOTACTIC COMP ASSIST PROC,SPINAL N/A 05/16/2022   Procedure: STEREOTACTIC COMPUTER-ASSISTED (NAVIGATIONAL) PROCEDURE; SPINE;  Surgeon: Rocco Organ Dipakkumar, MD;  Location: MAIN OR [facility];  Service: Neurosurgery  . PR TRACHEOSTOMY, PLANNED N/A 12/20/2021   Procedure: TRACHEOSTOMY PLANNED (SEPART PROC);  Surgeon: Reyes Gladis Ovens, MD;  Location: MAIN OR [facility];  Service: ENT  . PR UPPER GI  ENDOSCOPY,DIAGNOSIS N/A 12/20/2021   Procedure: UGI ENDO, INCLUDE ESOPHAGUS, STOMACH, & DUODENUM &/OR JEJUNUM; DX W/WO COLLECTION SPECIMN, BY BRUSH OR WASH;  Surgeon: Allyne Michelina Render, MD;  Location: MAIN OR [facility];  Service: Thoracic  . PR WRIST ARTHROSCOP,RELEASE XVERS LIG Right 04/01/2023   Procedure: Right endoscopic carpal tunnel release;  Surgeon: Evelena Oneil Barter, MD;  Location: OR [facility];  Service: Neurosurgery  [3] Family History Problem Relation Age of Onset  . Urolithiasis Father   . Coronary artery disease Father   . Hypertension Mother   . Cancer Mother        breast  . Diabetes Mother   . Coronary artery disease Paternal Grandfather   [4] Social History Tobacco Use  . Smoking status: Every Day    Current packs/day: 0.50    Average packs/day: 0.5 packs/day for 30.0 years (15.0 ttl pk-yrs)    Types: Cigarettes  Substance Use Topics  . Alcohol  use: No    Alcohol /week: 0.0 standard drinks of alcohol   . Drug use: No

## 2024-03-23 NOTE — Progress Notes (Addendum)
 NEUROSURGERY CLINIC NOTE ESTABLISHED PATIENT  Assessment and Recommendations [name] is a 53 y.o. 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 On his most recent imaging, he does have stenosis at C3-4 and loosening of the C3 screws. Clinically he is having frequent falls which could be evidence of myelopathy however left wrist weakness may be a more peripheral etiology  - Will plan for C3-4 revision laminectomy and fusion - Will discuss case and refer patient to Dr. Catarino in plastic surgery for assistance with wound closure - Referral to PT for left wrist weakness  Encounter diagnoses  Diagnosis ICD-10-CM Associated Orders  1. Cervical spondylosis with myelopathy  M47.12 Ambulatory referral to Physical Therapy    Ambulatory referral to Plastic Surgery       Interval history [name] is a 53 y.o. male who returns to clinic today for follow up. ~5 days ago he felt his left wrist give out when he put weight on it and since then has had difficulty moving his wrist. Has had difficulty holding objects as well as numbness from the forearm into the hand. He has fallen twice in the last week.   Review of Systems A 10-system review of systems was conducted and was negative except as documented above in the HPI.  Physical Exam  Vital Signs:  Temp 36.3 C (97.4 F) (Temporal)   Ht 188 cm (6' 2)   Wt 95.7 kg (211 lb)   BMI 27.09 kg/m  General: No acute distress, Body mass index is 27.09 kg/m.  Neurological Exam: EOSp RUE 4-4+ d/b/t/wf/hg, 2-3 wrist extension, able to extend wrist to neutral but not further BLE 5 hf/ke/pf/df/ehl No hoffman's No hyperreflexia No clonus  Cardiovascular:  Warm and well perfused  with good pulses, no edema Pulmonary:  Unlabored breathing, no pursed lips or wheezing, acyanotic Abdomen:  Soft, nontender, nondistended Skin:  No petechiae, rashes or ecchymoses       Neurological imaging reviewed MRI C spine and CT C spine from OSH 03/18/24 reviewed as above.   ---Historical data---  Allergies Aspirin   Medications   Medications reviewed in Epic.  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 CAD (coronary artery disease)   . Chest pain   . Diabetes mellitus     . HFrEF (heart failure with reduced ejection fraction)   01/19/2021  . Hyperlipidemia   . Hypertension   . Myocardial infarction     . SOB (shortness of breath)   . Substance abuse (CMS-HCC)   [2] Past Surgical History: Procedure Laterality Date  . BACK SURGERY  08/19/2006  . CORONARY STENT PLACEMENT    . FOOT AMPUTATION THROUGH METATARSAL Right 10/11/2020   Jolynn Pack  . NECK SURGERY    . PR ALLOGRAFT FOR SPINE SURGERY ONLY MORSELIZED N/A 05/16/2022   Procedure: ALLOGRAFT FOR SPINE SURGERY ONLY; MORSELIZED;  Surgeon: Rocco Weyman Jaffe, MD;  Location: MAIN OR [facility];  Service:  Neurosurgery  . PR ALLOGRAFT FOR SPINE SURGERY ONLY STRUCTURAL N/A 05/16/2022   Procedure: ALLOGRAFT FOR SPINE SURGERY ONLY; STRUCTURAL;  Surgeon: Rocco Organ Dipakkumar, MD;  Location: MAIN OR [facility];  Service: Neurosurgery  . PR ARTHRD PST/PSTLAT TQ 1NTRSPC CRV BELW C2 SEGMENT N/A 05/16/2022   Procedure: ARTHRODESIS, POSTERIOR OR POSTEROLATERAL TECHNIQUE, SINGLE INTERSPACE; CERVICAL BELOW C2 SEGMENT;  Surgeon: Rocco Organ Jaffe, MD;   Location: MAIN OR [facility];  Service: Neurosurgery  . PR ARTHRODESIS PST/PSTLAT TQ 1NTRSPC EA ADDL NTRSPC N/A 05/16/2022   Procedure: MIN INV ARTHRODESIS, POSTERIOR OR POSTEROLATERAL TECHNIQUE, SINGLE INTERSPACE; EACH ADDITIONAL INTERSPACE (LIST SEPARATELY IN ADDITION TO CODE FOR PRIMARY PROCEDURE);  Surgeon: Rocco Organ Jaffe, MD;  Location: MAIN OR [facility];  Service: Neurosurgery  . PR ESOPHAGOSCOPY,DIAGNOSTIC N/A 01/07/2022   Procedure: ESOPHAGOSCOPY, RIGID OR FLEXIBLE; DIAGNOSTIC, W/WO COLLECTION OF SPECIMEN(S) BY BRUSHING OR WASHING;  Surgeon: Reyes Gladis Ovens, MD;  Location: MAIN OR [facility];  Service: ENT  . PR EXPLOR POSTOP BLEED,INFEC,CLOT-CHST N/A 12/20/2021   Procedure: EXPLOR POSTOP HEMORR THROMBOSIS/INFEC; CHEST;  Surgeon: Allyne Michelina Render, MD;  Location: MAIN OR [facility];  Service: Thoracic  . PR EXPLOR POSTOP BLEED,INFEC,CLOT-NECK Right 12/20/2021   Procedure: EXPLORATION FOR POSTOPERATIVE HEMORRHAGE, THROMBOSIS OR INFECTION; NECK;  Surgeon: Reyes Gladis Ovens, MD;  Location: MAIN OR [facility];  Service: ENT  . PR EXPLOR POSTOP BLEED,INFEC,CLOT-NECK Right 12/23/2021   Procedure: EXPLORATION FOR POSTOPERATIVE HEMORRHAGE, THROMBOSIS OR INFECTION; NECK;  Surgeon: Silver Elnor Daisy, MD;  Location: MAIN OR [facility];  Service: ENT  . PR EXPLOR POSTOP BLEED,INFEC,CLOT-NECK N/A 12/25/2021   Procedure: EXPLORATION FOR POSTOPERATIVE HEMORRHAGE, THROMBOSIS OR INFECTION; NECK;  Surgeon: Reyes Gladis Ovens, MD;  Location: MAIN OR [facility];  Service: ENT  . PR IONM 1 ON 1 IN OR W/ATTENDANCE EACH 15 MINUTES N/A 05/16/2022   Procedure: CONTINUOUS INTRAOPERATIVE NEUROPHYSIOLOGY MONITORING IN OR;  Surgeon: Rocco Organ Dipakkumar, MD;  Location: MAIN OR [facility];  Service: Neurosurgery  . PR LAM FACETECTOMY&FORAMOT 1 VRT SGM EA ADDL SGM N/A 05/16/2022   Procedure: LAMINECTOMY, FACETECTOMY AND FORAMINOTOMY (UNI/BI W DECOMP OF SPINAL CORD, CAUDA EQUINA AND/ NERVE ROOT'S), SNGLE VERTEBRAL SEG; EACH ADDTL  VERTEBRAL SEG, CERVICAL, THORACIC, OR LUM (ADD TO PRIM PROC);  Surgeon: Rocco Organ Jaffe, MD;  Location: MAIN OR [facility];  Service: Neurosurgery  . PR LAMINEC/FACETECT/FORAMIN,CERVICAL 1 SEG N/A 05/16/2022   Procedure: LAMINECTOMY SNGL VERTEBRAL SEGMT-UNI/BIL; CERV;  Surgeon: Rocco Organ Dipakkumar, MD;  Location: MAIN OR [facility];  Service: Neurosurgery  . PR MICROSURG TECHNIQUES,REQ OPER MICROSCOPE N/A 05/16/2022   Procedure: MICROSURGICAL TECHNIQUES, REQUIRING USE OF OPERATING MICROSCOPE (LIST SEPARATELY IN ADDITION TO CODE FOR PRIMARY PROCEDURE);  Surgeon: Rocco Organ Jaffe, MD;  Location: MAIN OR [facility];  Service: Neurosurgery  . PR PLACE PERCUT GASTROSTOMY TUBE N/A 01/07/2022   Procedure: UGI ENDO; W/DIRECTED PLCMT PERQ GASTROSTOMY TUBE;  Surgeon: Allyne Michelina Render, MD;  Location: MAIN OR [facility];  Service: Thoracic  . PR POSTERIOR SEGMENTAL INSTRUMENTATION 7-12 VRT SEG N/A 05/16/2022   Procedure: POSTERIOR SEGMENTAL INSTRUMENTATION; (EG, PEDICLE FIXATION, DUAL RODS W/MULT HOOKS/WIRES) 7-12 VERTEB SEGMT;  Surgeon: Rocco Organ Dipakkumar, MD;  Location: MAIN OR [facility];  Service: Neurosurgery  . PR REMOVAL IMPLANT DEEP Right 12/20/2021   Procedure: HARDWARE REMOVAL SPINE;  Surgeon: Organ Jaffe Rocco, MD;  Location: MAIN OR [facility];  Service: Neurosurgery  . PR REVISE ULNAR NERVE AT ELBOW Right 04/01/2023   Procedure: Ulnar nerve decompression and possible transposition;  Surgeon: Evelena Oneil Barter, MD;  Location: OR [facility];  Service: Neurosurgery  .  PR STEREOTACTIC COMP ASSIST PROC,SPINAL N/A 05/16/2022   Procedure: STEREOTACTIC COMPUTER-ASSISTED (NAVIGATIONAL) PROCEDURE; SPINE;  Surgeon: Rocco Organ Dipakkumar, MD;  Location: MAIN [facility];  Service: Neurosurgery  . PR TRACHEOSTOMY, PLANNED N/A 12/20/2021   Procedure: TRACHEOSTOMY PLANNED (SEPART PROC);  Surgeon: Reyes Gladis Ovens, MD;  Location: MAIN OR [facility];  Service: ENT  . PR UPPER GI  ENDOSCOPY,DIAGNOSIS N/A 12/20/2021   Procedure: UGI ENDO, INCLUDE ESOPHAGUS, STOMACH, & DUODENUM &/OR JEJUNUM; DX W/WO COLLECTION SPECIMN, BY BRUSH OR WASH;  Surgeon: Allyne Michelina Render, MD;  Location: MAIN OR [facility];  Service: Thoracic  . PR WRIST ARTHROSCOP,RELEASE XVERS LIG Right 04/01/2023   Procedure: Right endoscopic carpal tunnel release;  Surgeon: Evelena Oneil Barter, MD;  Location: OR [facility];  Service: Neurosurgery  [3] Family History Problem Relation Age of Onset  . Urolithiasis Father   . Coronary artery disease Father   . Hypertension Mother   . Cancer Mother        breast  . Diabetes Mother   . Coronary artery disease Paternal Grandfather   [4] Social History Tobacco Use  . Smoking status: Every Day    Current packs/day: 0.50    Average packs/day: 0.5 packs/day for 30.0 years (15.0 ttl pk-yrs)    Types: Cigarettes  Substance Use Topics  . Alcohol  use: No    Alcohol /week: 0.0 standard drinks of alcohol   . Drug use: No

## 2024-03-23 NOTE — Telephone Encounter (Addendum)
 Patient has been deemed a surgical candidate per Dr. Rocco.  Surgery: Possible C2,C3-C4 revision lami fusion w/ plastics Dr. Catarino  Location: [facility] ([address] [place] [address])  -Allergy to Adhesives or Surgical Glue: NO  -Type of Anesthesia: General  -Anticoagulation: Plavix  (supposed to take Aspirin , not on currently)  -Nicotine /Tobacco: reports quitting 8/5  -Last A1C:   -[facility] Referral: N/A  -Other/Special Considerations:   Pending Workup:  -Specialty Clearance: Cardiology- Dr. Landry Lunch NP- faxed 03/23/24   Izetta Minus, MSN, RN  Nurse Coordinator, Spine/Nerve Surgery [facility] Neurosurgery

## 2024-03-24 ENCOUNTER — Telehealth: Payer: Self-pay | Admitting: Internal Medicine

## 2024-03-24 NOTE — Telephone Encounter (Signed)
   Name: Sherrell Farish.  DOB: 01-05-1971  MRN: [id number]  Primary Cardiologist: Dr.End  Chart reviewed as part of pre-operative protocol coverage. The patient has an upcoming visit scheduled with Tylene Lunch, NP on 04/16/2024 at which time clearance can be addressed in case there are any issues that would impact surgical recommendations.  Revision C3/4 lami fusion Is not scheduled until TBDas below. I added preop FYI to appointment note so that provider is aware to address at time of outpatient visit.  Per office protocol the cardiology provider should forward their finalized clearance decision and recommendations regarding antiplatelet therapy to the requesting party below.    Per office protocol, if patient is without any new symptoms or concerns at the time of their virtual visit, he may hold Plavix  for 5 days prior to procedure. Please resume Plavix  as soon as possible postprocedure, at the discretion of the surgeon.    I will route this message as FYI to requesting party and remove this message from the preop box as separate preop APP input not needed at this time.   Please call with any questions.  Lamarr Satterfield, NP  03/24/2024, 9:26 AM

## 2024-03-24 NOTE — Telephone Encounter (Signed)
   Pre-operative Risk Assessment    Patient Name: [name].  DOB: Jan 18, 1971 MRN: [id number]   Date of last office visit: 01/15/24 Date of next office visit: 04/16/24   Request for Surgical Clearance    Procedure:  Revision C3/4 lami fusion  Date of Surgery:  Clearance TBD                                Surgeon:  Weyman Morton, MD Surgeon's Group or Practice Name:  [facility] Phone number:  [phone] Fax number:  [phone]   Type of Clearance Requested:   - Pharmacy:  Hold Clopidogrel  (Plavix ) see instructions   Type of Anesthesia:  General    Additional requests/questions:    Signed, Gwendolyn H Slade   03/24/2024, 9:04 AM

## 2024-03-31 ENCOUNTER — Ambulatory Visit: Admitting: Physical Therapy

## 2024-03-31 ENCOUNTER — Ambulatory Visit: Payer: MEDICAID | Admitting: Podiatry

## 2024-03-31 ENCOUNTER — Telehealth: Payer: Self-pay | Admitting: Physical Therapy

## 2024-03-31 NOTE — Telephone Encounter (Signed)
 Called pt to inquire about absence from PT apt. He reports that he does not have a ride and he will not be able to make this apt but he does plan on coming to next week's apt.

## 2024-04-06 ENCOUNTER — Ambulatory Visit: Admitting: Physical Therapy

## 2024-04-06 NOTE — Progress Notes (Signed)
 Plastic Surgery Clinic Note  HPI: [name] is a 53 y.o. male who presents for pre-op visit. They have a history of DM, HTN, MI in 2022, congestive heart failure, HLD, CAD, STEMI, BPH, esophageal pf. Had heart attack and fell of a tractor and broke his spine. 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 Most recently has onset weakness of LUE and test revealed stenosis at C3-4 and a loose screw at C3.   They are now scheduled for laminectomy and fusion with closure of back with local flap using myocutaneous vs fasciocutaneous possible relaxing incisionsin a joint case with Dr.Upadhyaya. He has a 38 pack year smoking history and is currently smoking. He is on 2 anticoagulation medications.    Physical Examination:   Visit Vitals BP 156/84 (BP Position: Sitting)  Pulse 70  Temp 35.6 C (96.1 F)  Resp 16  Wt 95.3 kg (210 lb)  SpO2 96%  BMI 26.96 kg/m   Gen: Alert and oriented. no acute distress, resting comfortably Cardiovascular: Normal rate  Pulmonary: Normal work of breathing on room air.  Extremities: Warm and well perfused.  Back: Prominent hardware protruding. No hardware exposed. Scar that runs down the length of his back. Non tender to palpation. Skin with laxity.   Assessment:  [name] is a 53 y.o. male presents for pre-op visit before closure of back with local flap using myocutaneous vs fasciocutaneous possible relaxing incisions. Our discussion included review of surgical techniques and timing, recovery room and hospitalization course including use and care of drains, wound care and dressings. We discussed that this procedure would be performed under general anesthesia. We discussed the risks including  scarring, bleeding, hematoma, seroma, asymmetry, wound healing issues, infection, need for revision, as well as the anticipated post operative course including no heavy lifting greater than 5 pounds, pushing, pulling, bending, stretching, exercise,  Yoga, weight lifting even if less than 5 pounds, no housework including no vacuuming, mopping, sweeping, swiffering, wiping down counters, cooking with heavy pans, dog walking or yard work for four weeks after surgery. She was agreeable and interested in proceeding.Counseled him on smoke cessation and the risks. Patient is understanding and would not like to begin smoking cessation.     Plan:  - OR for closure of back with local flap using myocutaneous vs fasciocutaneous possible relaxing incisions not scheduled as of right now  - Consent signed today   Documentation assistance was provided by Damien Muzzy, Scribe, on April 07, 2024 at 12:32 PM for Macario Power, MD.   The documentation recorded by the scribe accurately reflects the service I personally performed and the decisions made by me. Macario DELENA Power, MD

## 2024-04-08 ENCOUNTER — Encounter: Admitting: Physical Therapy

## 2024-04-12 ENCOUNTER — Ambulatory Visit: Admitting: Physical Therapy

## 2024-04-15 ENCOUNTER — Ambulatory Visit: Admitting: Physical Therapy

## 2024-04-16 ENCOUNTER — Ambulatory Visit: Admitting: Cardiology

## 2024-04-16 NOTE — Progress Notes (Deleted)
 Cardiology Office Note   Date:  04/16/2024  ID:  Frank LITTIE Cathern Gutierrez., DOB 31-Jul-1971, MRN [id number] PCP: [facility]  [facility] Providers Cardiologist:  None { Click to update primary MD,subspecialty MD or APP then REFRESH:1}    History of Present Illness [name]. is a 53 y.o. male with a past medical history of coronary disease status post multiple PCI's of STEMI (01/2021) due to simultaneous LAD stent thrombosis and mid RCA as well as occlusion of the left circumflex complicated by cardiogenic shock, HFrEF due to ischemic cardiomyopathy, right lower extremity amputation, primary pretension, mixed hyperlipidemia, type 2 diabetes, cervical myelopathy, presents today for follow-up of his coronary artery disease.   Presented to [facility] 6/22 for STEMI after syncopal episode, was found by EMS and EKG showed ST elevations. He underwent aspiration thrombectomy with PTCA of the mid RCA and stenting of the mid left circumflex. He had transient complete heart block in the setting of his MI which resolved with revascularization. Echocardiogram revealed LVEF of 40%. Transient atrial fibrillation with RVR during the PCI. He then underwent cervical spinal fusion 9/22 and was subsequently admitted with a 3-day history of dysphagia and underwent CT of the chest where there was concerns for hardware migration and adjacent abscess. He was briefly on Levophed  and transferred to [facility] for ENT, neurosurgery, thoracic surgery evaluation. Due to the size of the cervical abscess and supraglottic compression he was taken for an awake tracheostomy. 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 He tolerated the procedures well without incident. He was discharged in stable condition and aspirin  was restarted on discharge and Brilinta  was held for 2 weeks.   Was evaluated in clinic 12/05/2022 with continued complaints of worsening  dyspnea.  He was scheduled for Lexiscan  Myoview  which revealed no ischemia and he was a no-show for his echocardiogram appointment.  He was then seen in clinic 02/06/2023 overall was doing fairly well from a cardiac perspective.  He stated he been having palpitations with elevated heart rates with or without physical therapy up to 520 bpm and spontaneously dropped to 50 bpm.  He was placed on a ZIO XT monitor which revealed normal sinus rhythm with rare PACs and PVCs, no atrial fibrillation and no pathologic arrhythmias.  Echocardiogram as well and completed which revealed LVEF of 40-45% and largely unchanged from prior study.  He was seen in clinic 06/13/2023 at that time he was doing well with no further episodes of palpitations, chest pain or shortness of breath.  Reports he had been discharged from [facility] no running or running from her friend.  He had been walking with a walker and was trying to increase his exercise.  He was last seen in clinic 01/15/2024 overall so well with a cardiac perspective.  He stated that that makes up at the pharmacy with his Brilinta  and he been without it for approximately 6 days.  He was going to be following up with orthopedics about having injections of his right shoulder due to a torn rotator cuff.   Was evaluated in the [facility] emergency department  He returns to clinic today     ROS: 10 point review of systems has been reviewed and considered negative exception was been listed in the HPI  Studies Reviewed      Event Monitor (Zio) 03/18/2023   Normal sinus rhythm with rare PACs and PVCs.   No atrial fibrillation.  Symptoms associated with PVCs.   No pathologic arrhythmias.   2D echo 02/18/2023 1. Left ventricular ejection fraction, by estimation, is 40 to 45%. The  left ventricle has mildly decreased function. The left ventricle  demonstrates regional wall motion abnormalities (inferior wall  hypokinesis). Left ventricular diastolic parameters   are indeterminate.   2. Right ventricular systolic function is normal. The right ventricular  size is normal. Tricuspid regurgitation signal is inadequate for assessing  PA pressure.   3. The mitral valve is normal in structure. No evidence of mitral valve  regurgitation. No evidence of mitral stenosis.   4. The aortic valve has an indeterminant number of cusps. There is mild  calcification of the aortic valve. Aortic valve regurgitation is not  visualized. Aortic valve sclerosis is present, with no evidence of aortic  valve stenosis.   5. The inferior vena cava is normal in size with greater than 50%  respiratory variability, suggesting right atrial pressure of 3 mmHg.   Lexiscan  MPI 12/13/22 Pharmacological myocardial perfusion imaging study with significant GI uptake artifact limiting interpretation Unable to exclude inferior, inferolateral wall, mid to distal anterior wall ischemia given artifact Global hypokinesis, EF estimated at 22% (ejection fraction possibly erroneous in the setting of GI uptake artifact) No EKG changes concerning for ischemia at peak stress or in recovery. Unable to exclude high risk scan, significant GI uptake artifact limiting interpretation.   Consider alternate imaging modality if clinically indicated   Limited TTE 12/03/21 1. Left ventricular ejection fraction, by estimation, is 40 to 45%. Left  ventricular ejection fraction by 2D MOD biplane is 45.1 %. The left  ventricle has mild to moderately decreased function. There is severe  hypokinesis of the left ventricular, entire   inferolateral wall.   2. Right ventricular systolic function is normal. The right ventricular  size is normal.   3. The mitral valve is normal in structure. No evidence of mitral valve  regurgitation.   4. The aortic valve was not well visualized. Aortic valve regurgitation  is not visualized. Aortic valve sclerosis is present, with no evidence of  aortic valve stenosis.   5.  The inferior vena cava is dilated in size with <50% respiratory  variability, suggesting right atrial pressure of 15 mmHg.  Risk Assessment/Calculations   No BP recorded.  {Refresh Note OR Click here to enter BP  :1}***       Physical Exam VS:  There were no vitals taken for this visit.       Wt Readings from Last 3 Encounters:  03/18/24 199 lb (90.3 kg)  02/12/24 206 lb (93.4 kg)  01/15/24 207 lb 3.2 oz (94 kg)    GEN: Well nourished, well developed in no acute distress NECK: No JVD; No carotid bruits CARDIAC: ***RRR, no murmurs, rubs, gallops RESPIRATORY:  Clear to auscultation without rales, wheezing or rhonchi  ABDOMEN: Soft, non-tender, non-distended EXTREMITIES:  No edema; No deformity   ASSESSMENT AND PLAN Coronary artery disease of native coronary artery without angina Chronic HFrEF/ischemic cardiomyopathy Palpitations Mixed hyperlipidemia Primary hypertension Tobacco abuse    {Are you ordering a CV Procedure (e.g. stress test, cath, DCCV, TEE, etc)?   Press F2        :[id number]}  Dispo: ***  Signed, Blia Totman, NP

## 2024-04-21 ENCOUNTER — Ambulatory Visit: Attending: Cardiology | Admitting: Cardiology

## 2024-04-21 ENCOUNTER — Encounter: Admitting: Physical Therapy

## 2024-04-21 ENCOUNTER — Encounter: Payer: Self-pay | Admitting: Cardiology

## 2024-04-21 VITALS — BP 120/70 | HR 81 | Ht 74.0 in | Wt 207.0 lb

## 2024-04-21 DIAGNOSIS — E1169 Type 2 diabetes mellitus with other specified complication: Secondary | ICD-10-CM | POA: Insufficient documentation

## 2024-04-21 DIAGNOSIS — I5022 Chronic systolic (congestive) heart failure: Secondary | ICD-10-CM | POA: Insufficient documentation

## 2024-04-21 DIAGNOSIS — Z720 Tobacco use: Secondary | ICD-10-CM | POA: Diagnosis present

## 2024-04-21 DIAGNOSIS — I255 Ischemic cardiomyopathy: Secondary | ICD-10-CM | POA: Diagnosis not present

## 2024-04-21 DIAGNOSIS — E785 Hyperlipidemia, unspecified: Secondary | ICD-10-CM | POA: Diagnosis present

## 2024-04-21 DIAGNOSIS — I10 Essential (primary) hypertension: Secondary | ICD-10-CM | POA: Diagnosis present

## 2024-04-21 DIAGNOSIS — Z01810 Encounter for preprocedural cardiovascular examination: Secondary | ICD-10-CM | POA: Insufficient documentation

## 2024-04-21 DIAGNOSIS — I2510 Atherosclerotic heart disease of native coronary artery without angina pectoris: Secondary | ICD-10-CM | POA: Insufficient documentation

## 2024-04-21 DIAGNOSIS — R002 Palpitations: Secondary | ICD-10-CM | POA: Insufficient documentation

## 2024-04-21 NOTE — Progress Notes (Signed)
 Cardiology Office Note   Date:  04/21/2024  ID:  [name]., DOB 03/04/1971, MRN [id number] PCP: [facility], [facility] Providers Cardiologist:  None     History of Present Illness [name]. is a 53 y.o. male with a past medical history of coronary disease status post multiple PCI's of STEMI (01/2021) due to simultaneous LAD stent thrombosis and mid RCA as well as occlusion of the left circumflex complicated by cardiogenic shock, HFrEF due to ischemic cardiomyopathy, right lower extremity amputation, primary pretension, mixed hyperlipidemia, type 2 diabetes, cervical myelopathy, presents today for follow-up of his coronary artery disease.   Presented to [facility] 6/22 for STEMI after syncopal episode, was found by EMS and EKG showed ST elevations. He underwent aspiration thrombectomy with PTCA of the mid RCA and stenting of the mid left circumflex. He had transient complete heart block in the setting of his MI which resolved with revascularization. Echocardiogram revealed LVEF of 40%. Transient atrial fibrillation with RVR during the PCI. He then underwent cervical spinal fusion 9/22 and was subsequently admitted with a 3-day history of dysphagia and underwent CT of the chest where there was concerns for hardware migration and adjacent abscess. He was briefly on Levophed  and transferred to [facility] for ENT, neurosurgery, thoracic surgery evaluation. Due to the size of the cervical abscess and supraglottic compression he was taken for an awake tracheostomy. 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 He tolerated the procedures well without incident. He was discharged in stable condition and aspirin  was restarted on discharge and Brilinta  was held for 2 weeks.   Was evaluated in clinic 12/05/2022 with continued complaints of worsening dyspnea.  He was scheduled for Lexiscan  Myoview  which revealed no  ischemia and he was a no-show for his echocardiogram appointment.  He was then seen in clinic 02/06/2023 overall was doing fairly well from a cardiac perspective.  He stated he been having palpitations with elevated heart rates with or without physical therapy up to 520 bpm and spontaneously dropped to 50 bpm.  He was placed on a ZIO XT monitor which revealed normal sinus rhythm with rare PACs and PVCs, no atrial fibrillation and no pathologic arrhythmias.  Echocardiogram as well and completed which revealed LVEF of 40-45% and largely unchanged from prior study.  He was seen in clinic 06/13/2023 at that time he was doing well with no further episodes of palpitations, chest pain or shortness of breath.  Reports he had been discharged from [facility] no running or running from her friend.  He had been walking with a walker and was trying to increase his exercise.  He was last seen in clinic 01/15/2024 overall so well with a cardiac perspective.  He stated that that makes up at the pharmacy with his Brilinta  and he been without it for approximately 6 days.  He was going to be following up with orthopedics about having injections of his right shoulder due to a torn rotator cuff.   Was evaluated in the [facility] emergency department 03/08/2024 with complaint of numbness.  He had residual numbness and mild weakness to his right upper extremity, presenting with left upper extremity weakness and numbness.  States he noticed it when he woke up earlier in the morning at 5 AM.  Last known well was at midnight when he went to bed.  States that prior to the feeling he was at his baseline.  Did note he fell 3 days ago when he lost his balance and fell onto his legs from turning to send a chair.  Sustained some abrasions to the right anterior shin.  Noted 2 days ago that he was having redness to the shin.  No drainage.  Vital signs were stable.  He was treated with doxycycline , Rocephin , and insulin .  No acute infarcts on  MRI of the brain, MRI of cervical spine showed stenosis and spinal cord mass effect but no definite spinal cord signals, thoracic spine MRI showed disc degeneration and mild thoracic cord mass effect without definite spinal cord signals.  Discussed with neurosurgery here who states that if he is not rapidly worsening without balance issues he would be okay to follow-up with outpatient neurosurgery at [facility].  He returns to clinic today and overall for the cardiac perspective he has been doing well.  He denies any chest pain, shortness of breath, dyspnea on exertion or peripheral edema.  He continues to have occasional dyspnea on exertion that is unchanged over the past year as well as continues to smoke approximately 1 pack/day.  He denies any recent hospitalizations, visits to the emergency department, or surgeries.  He has requested preoperative cardiovascular examination today by the physician of the C3/4 Lami fusion.  States that he has been compliant with his current medication regimen without undue side effects.  ROS: 10 point review of systems has been reviewed and considered negative exception was been listed in the HPI  Studies Reviewed EKG Interpretation Date/Time:  Wednesday April 21 2024 10:11:36 EDT Ventricular Rate:  81 PR Interval:  144 QRS Duration:  106 QT Interval:  394 QTC Calculation: 457 R Axis:   203  Text Interpretation: Normal sinus rhythm Indeterminate axis When compared with ECG of 18-Mar-2024 06:35, PREVIOUS ECG IS PRESENT Confirmed by Gerard Frederick ([id number]) on 04/21/2024 10:14:10 AM    Event Monitor (Zio) 03/18/2023   Normal sinus rhythm with rare PACs and PVCs.   No atrial fibrillation.   Symptoms associated with PVCs.   No pathologic arrhythmias.   2D echo 02/18/2023 1. Left ventricular ejection fraction, by estimation, is 40 to 45%. The  left ventricle has mildly decreased function. The left ventricle  demonstrates regional wall motion abnormalities (inferior  wall  hypokinesis). Left ventricular diastolic parameters  are indeterminate.   2. Right ventricular systolic function is normal. The right ventricular  size is normal. Tricuspid regurgitation signal is inadequate for assessing  PA pressure.   3. The mitral valve is normal in structure. No evidence of mitral valve  regurgitation. No evidence of mitral stenosis.   4. The aortic valve has an indeterminant number of cusps. There is mild  calcification of the aortic valve. Aortic valve regurgitation is not  visualized. Aortic valve sclerosis is present, with no evidence of aortic  valve stenosis.   5. The inferior vena cava is normal in size with greater than 50%  respiratory variability, suggesting right atrial pressure of 3 mmHg.   Lexiscan  MPI 12/13/22 Pharmacological myocardial perfusion imaging study with significant GI uptake artifact limiting interpretation Unable to exclude inferior, inferolateral wall, mid to distal anterior wall ischemia given artifact Global hypokinesis, EF estimated at 22% (ejection fraction possibly erroneous in the setting of GI uptake artifact) No EKG changes concerning for ischemia at peak stress or in recovery. Unable to exclude high risk scan, significant GI uptake artifact limiting interpretation.   Consider alternate imaging modality if clinically indicated   Limited TTE 12/03/21 1.  Left ventricular ejection fraction, by estimation, is 40 to 45%. Left  ventricular ejection fraction by 2D MOD biplane is 45.1 %. The left  ventricle has mild to moderately decreased function. There is severe  hypokinesis of the left ventricular, entire   inferolateral wall.   2. Right ventricular systolic function is normal. The right ventricular  size is normal.   3. The mitral valve is normal in structure. No evidence of mitral valve  regurgitation.   4. The aortic valve was not well visualized. Aortic valve regurgitation  is not visualized. Aortic valve sclerosis is  present, with no evidence of  aortic valve stenosis.   5. The inferior vena cava is dilated in size with <50% respiratory  variability, suggesting right atrial pressure of 15 mmHg.  Risk Assessment/Calculations         Physical Exam VS:  BP 120/70 (BP Location: Left Arm, Patient Position: Sitting, Cuff Size: Normal)   Pulse 81   Ht 6' 2 (1.88 m)   Wt 207 lb (93.9 kg)   SpO2 98%   BMI 26.58 kg/m        Wt Readings from Last 3 Encounters:  04/21/24 207 lb (93.9 kg)  03/18/24 199 lb (90.3 kg)  02/12/24 206 lb (93.4 kg)    GEN: Well nourished, well developed in no acute distress NECK: No JVD; No carotid bruits CARDIAC: RRR, I/VI systolic murmur, without rubs or gallops RESPIRATORY:  Clear to auscultation without rales, wheezing or rhonchi  ABDOMEN: Soft, non-tender, non-distended EXTREMITIES:  No edema; No deformity, bruising to the upper extremities has improved since changing from Brilinta  to clopidogrel .  ASSESSMENT AND PLAN Coronary artery disease of native coronary artery without angina.  He had previously undergone aspiration thrombectomy and PTCA of the mid RCA with stenting to the mid left circumflex.  Lexiscan  Myoview  completed last year showed no significant EKG changes concerning for ischemia but large GI uptake with depressed EF.  Echocardiogram revealed LVEF 40-45%, LV was mildly decreased function.  Aortic valve sclerosis was present without evidence of stenosis.  EKG today reveals sinus rhythm with a rate of 81 with an indeterminate axis and no acute ischemic changes.  He has been continued on clopidogrel  75 mg daily, statin statin 40 mg daily.  No further ischemic testing is needed at this time.  Chronic HFrEF/ischemic cardiomyopathy with an LVEF 40-45% in 02/2023.  He appears euvolemic on exam and is well compensated.  He is continued on Jardiance  10 mg daily, furosemide  40 mg daily, losartan  12.5 mg daily.  Medication changes have been deferred today as he is having  issues with his insurance and is requesting a letter be sent to his insurance stating he is no longer residing at [facility] for rehab that he is living in a private residence.  Will consider repeating echocardiogram on return and potential referral to advanced heart failure once his insurance issues have resolved.  Palpitations which have been quiescent.   Mixed hyperlipidemia associated with type 2 diabetes with his last LDL of 21.  He is below the goal of 55.  He has continued on rosuvastatin  40 mg daily and ezetimibe  10 mg daily.  Ongoing management of his type 2 diabetes by his primary care provider.  Primary hypertension with a blood pressure today 120/70.  He has been continued on furosemide  40 mg daily and losartan  12.5 mg daily.  He has been encouraged to continue to monitor his pressures 1 to 2 hours postmedication administration as well.  Tobacco  abuse where he continues to smoke approximately half pack per day.  Total cessation is recommended.  Currently he is not interested in cessation.  Preprocedure cardiovascular examination    Mr. Piano's perioperative risk of a major cardiac event is 11% according to the Revised Cardiac Risk Index (RCRI).  Therefore, he is at high risk for perioperative complications.   His functional capacity is fair at 4.64 METs according to the Duke Activity Status Index (DASI). Recommendations: According to ACC/AHA guidelines, no further cardiovascular testing needed.  The patient may proceed to surgery at acceptable risk.   Antiplatelet and/or Anticoagulation Recommendations: Clopidogrel  (Plavix ) can be held for 5 days prior to his surgery and resumed as soon as possible post op.    Dispo: Patient to return to clinic to see MD/APP in 3 months or sooner if needed for further evaluation.  Signed, Janeisha Ryle, NP

## 2024-04-21 NOTE — Patient Instructions (Signed)
 Medication Instructions:  Your physician recommends that you continue on your current medications as directed. Please refer to the Current Medication list given to you today.   *If you need a refill on your cardiac medications before your next appointment, please call your pharmacy*  Lab Work: No labs ordered today  If you have labs (blood work) drawn today and your tests are completely normal, you will receive your results only by: MyChart Message (if you have MyChart) OR A paper copy in the mail If you have any lab test that is abnormal or we need to change your treatment, we will call you to review the results.  Testing/Procedures: No test ordered today   Follow-Up: At [facility], you and your health needs are our priority.  As part of our continuing mission to provide you with exceptional heart care, our providers are all part of one team.  This team includes your primary Cardiologist (physician) and Advanced Practice Providers or APPs (Physician Assistants and Nurse Practitioners) who all work together to provide you with the care you need, when you need it.  Your next appointment:   3 month(s)  Provider:   You may see one of the following Advanced Practice Providers on your designated Care Team:   Lonni Meager, NP Lesley Maffucci, PA-C Bernardino Bring, PA-C Cadence Splendora, PA-C Tylene Lunch, NP Barnie Hila, NP

## 2024-04-26 ENCOUNTER — Encounter: Admitting: Physical Therapy

## 2024-04-28 ENCOUNTER — Encounter: Admitting: Physical Therapy

## 2024-05-03 ENCOUNTER — Ambulatory Visit: Attending: Neurological Surgery | Admitting: Physical Therapy

## 2024-05-03 ENCOUNTER — Encounter: Admitting: Physical Therapy

## 2024-05-04 ENCOUNTER — Telehealth: Payer: Self-pay | Admitting: Physical Therapy

## 2024-05-04 NOTE — Telephone Encounter (Signed)
 Yesterday evening, PT called pt to verify that he would be coming to his eval. Pt explained that he had not yet gotten cervical spine revision surgery and he was awaiting clearance from surgeons. Before PT could discuss the need for him to still come in for evaluation of his left wrist which neurosurgeon believes it is separate from his cervical radiculopathy, phone cut out and PT was unable to get a hold of pt again. PT will try again later this week to discuss coming in for eval and that it is separate from surgery.

## 2024-05-05 ENCOUNTER — Telehealth: Payer: Self-pay | Admitting: Physical Therapy

## 2024-05-05 ENCOUNTER — Encounter: Admitting: Physical Therapy

## 2024-05-05 NOTE — Telephone Encounter (Signed)
 Called pt to notify him about the need for him to come in for wrist evaluation, which orthopedic surgeon believes is unrelated to his cervical pain. Left VM instructing pt to call back to schedule eval.

## 2024-05-10 ENCOUNTER — Ambulatory Visit: Admitting: Physical Therapy

## 2024-05-13 ENCOUNTER — Encounter: Admitting: Physical Therapy

## 2024-05-17 ENCOUNTER — Encounter: Admitting: Physical Therapy

## 2024-05-19 ENCOUNTER — Encounter: Admitting: Physical Therapy

## 2024-05-20 ENCOUNTER — Encounter: Admitting: Physical Therapy

## 2024-05-25 ENCOUNTER — Encounter: Admitting: Physical Therapy

## 2024-05-27 ENCOUNTER — Encounter: Admitting: Physical Therapy

## 2024-05-31 ENCOUNTER — Encounter: Admitting: Physical Therapy

## 2024-06-02 ENCOUNTER — Encounter: Admitting: Physical Therapy

## 2024-06-07 ENCOUNTER — Encounter: Admitting: Physical Therapy

## 2024-06-09 ENCOUNTER — Encounter: Admitting: Physical Therapy

## 2024-06-14 ENCOUNTER — Encounter: Admitting: Physical Therapy

## 2024-06-16 ENCOUNTER — Encounter: Admitting: Physical Therapy

## 2024-06-21 ENCOUNTER — Encounter: Admitting: Physical Therapy

## 2024-06-23 ENCOUNTER — Encounter: Admitting: Physical Therapy

## 2024-07-21 ENCOUNTER — Ambulatory Visit: Attending: Cardiology | Admitting: Cardiology

## 2024-07-21 NOTE — Progress Notes (Deleted)
 Cardiology Office Note   Date:  07/21/2024  ID:  [name]., DOB [birth year], MRN [id number] PCP: [facility]  [facility] Providers Cardiologist:  None { Click to update primary MD,subspecialty MD or APP then REFRESH:1}    History of Present Illness [name]. is a 54 y.o. male with a past medical history of coronary artery disease status post multiple PCI's and STEMI (01/2021) due to STEMI changes in his LAD stent to the ramus and mid RCA and occlusion of the left circumflex complicated by cardiogenic shock, HFrEF due to ischemic cardiomyopathy, right lower extremity amputation, primary hypertension, mixed hyperlipidemia, type 2 diabetes, cervical myelopathy, who presents today for follow-up of his coronary artery disease.   Presented to [facility] 6/22 for STEMI after syncopal episode, was found by EMS and EKG showed ST elevations. He underwent aspiration thrombectomy with PTCA of the mid RCA and stenting of the mid left circumflex. He had transient complete heart block in the setting of his MI which resolved with revascularization. Echocardiogram revealed LVEF of 40%. Transient atrial fibrillation with RVR during the PCI. He then underwent cervical spinal fusion 9/22 and was subsequently admitted with a 3-day history of dysphagia and underwent CT of the chest where there was concerns for hardware migration and adjacent abscess. He was briefly on Levophed  and transferred to [facility] for ENT, neurosurgery, thoracic surgery evaluation. Due to the size of the cervical abscess and supraglottic compression he was taken for an awake tracheostomy. 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 He tolerated the procedures well without incident. He was discharged in stable condition and aspirin  was restarted on discharge and Brilinta  was held for 2 weeks.   Was evaluated in clinic 12/05/2022 with continued complaints  of worsening dyspnea.  He was scheduled for Lexiscan  Myoview  which revealed no ischemia and he was a no-show for his echocardiogram appointment.  He was then seen in clinic 02/06/2023 overall was doing fairly well from a cardiac perspective.  He stated he been having palpitations with elevated heart rates with or without physical therapy up to 520 bpm and spontaneously dropped to 50 bpm.  He was placed on a ZIO XT monitor which revealed normal sinus rhythm with rare PACs and PVCs, no atrial fibrillation and no pathologic arrhythmias.  Echocardiogram as well and completed which revealed LVEF of 40-45% and largely unchanged from prior study.  He was seen in clinic 06/13/2023 at that time he was doing well with no further episodes of palpitations, chest pain or shortness of breath.  Reports he had been discharged from [facility] no running or running from her friend.  He had been walking with a walker and was trying to increase his exercise.  He was last seen in clinic 01/15/2024 overall so well with a cardiac perspective.  He stated that that makes up at the pharmacy with his Brilinta  and he been without it for approximately 6 days.  He was going to be following up with orthopedics about having injections of his right shoulder due to a torn rotator cuff.  Was evaluated in the [facility] emergency department 03/08/2024 with complaint of numbness.  He had residual numbness and mild weakness to his right upper extremity, presenting with left upper extremity weakness and numbness.  States he noticed it when he woke up earlier in the morning at 5 AM.  Last known well was at midnight when he went to  bed.  States that prior to the feeling he was at his baseline.  Did note he fell 3 days ago when he lost his balance and fell onto his legs from turning to send a chair.  Sustained some abrasions to the right anterior shin.  Noted 2 days ago that he was having redness to the shin.  No drainage.  Vital signs were stable.  He  was treated with doxycycline , Rocephin , and insulin .  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  Discussed with neurosurgery here who states that if he is not rapidly worsening without balance issues he would be okay to follow-up with outpatient neurosurgery at [facility].   He was last seen in clinic 05/08/2024 overall doing well from a cardiac perspective.  Denies chest pain, shortness of breath, or peripheral edema.  Continues to have occasional dyspnea on exertion that is unchanged over the past year but he continues to smoke approximately 1 pack/day.  He had requested preoperative cardiovascular examination for clearance for C3-4 laminectomy/fusion.  No changes made to his medication regimen and no other testing that was ordered.    He returns to clinic today  ROS: 10 point review of system has been reviewed and considered negative the exception was been listed in the HPI  Studies Reviewed      Event Monitor (Zio) 03/18/2023   Normal sinus rhythm with rare PACs and PVCs.   No atrial fibrillation.   Symptoms associated with PVCs.   No pathologic arrhythmias.   2D echo 02/18/2023 1. Left ventricular ejection fraction, by estimation, is 40 to 45%. The  left ventricle has mildly decreased function. The left ventricle  demonstrates regional wall motion abnormalities (inferior wall  hypokinesis). Left ventricular diastolic parameters  are indeterminate.   2. Right ventricular systolic function is normal. The right ventricular  size is normal. Tricuspid regurgitation signal is inadequate for assessing  PA pressure.   3. The mitral valve is normal in structure. No evidence of mitral valve  regurgitation. No evidence of mitral stenosis.   4. The aortic valve has an indeterminant number of  cusps. There is mild  calcification of the aortic valve. Aortic valve regurgitation is not  visualized. Aortic valve sclerosis is present, with no evidence of aortic  valve stenosis.   5. The inferior vena cava is normal in size with greater than 50%  respiratory variability, suggesting right atrial pressure of 3 mmHg.    Lexiscan  MPI 12/13/22 Pharmacological myocardial perfusion imaging study with significant GI uptake artifact limiting interpretation Unable to exclude inferior, inferolateral wall, mid to distal anterior wall ischemia given artifact Global hypokinesis, EF estimated at 22% (ejection fraction possibly erroneous in the setting of GI uptake artifact) No EKG changes concerning for ischemia at peak stress or in recovery. Unable to exclude high risk scan, significant GI uptake artifact limiting interpretation.   Consider alternate imaging modality if clinically indicated   Limited TTE 12/03/21 1. Left ventricular ejection fraction, by estimation, is 40 to 45%. Left  ventricular ejection fraction by 2D MOD biplane is 45.1 %. The left  ventricle has mild to moderately decreased function. There is severe  hypokinesis of the left ventricular, entire   inferolateral wall.   2. Right ventricular systolic function is normal. The right ventricular  size is normal.  3. The mitral valve is normal in structure. No evidence of mitral valve  regurgitation.   4. The aortic valve was not well visualized. Aortic valve regurgitation  is not visualized. Aortic valve sclerosis is present, with no evidence of  aortic valve stenosis.   5. The inferior vena cava is dilated in size with <50% respiratory  variability, suggesting right atrial pressure of 15 mmHg.  Risk Assessment/Calculations   No BP recorded.  {Refresh Note OR Click here to enter BP  :1}***       Physical Exam VS:  There were no vitals taken for this visit.       Wt Readings from Last 3 Encounters:  04/21/24 207 lb (93.9  kg)  03/18/24 199 lb (90.3 kg)  02/12/24 206 lb (93.4 kg)    GEN: Well nourished, well developed in no acute distress NECK: No JVD; No carotid bruits CARDIAC: ***RRR, no murmurs, rubs, gallops RESPIRATORY:  Clear to auscultation without rales, wheezing or rhonchi  ABDOMEN: Soft, non-tender, non-distended EXTREMITIES:  No edema; No deformity   ASSESSMENT AND PLAN Coronary artery disease of native coronary arteries without angina Chronic HFrEF/ischemic cardiomyopathy with an LVEF of 40-45% Palpitations Mixed hyperlipidemia Primary hypertension Tobacco abuse    {Are you ordering a CV Procedure (e.g. stress test, cath, DCCV, TEE, etc)?   Press F2        :[id number]}  Dispo: ***  Signed, Bernadean Saling, NP

## 2024-07-22 ENCOUNTER — Encounter: Payer: Self-pay | Admitting: Cardiology

## 2024-07-25 ENCOUNTER — Other Ambulatory Visit: Payer: Self-pay

## 2024-07-25 ENCOUNTER — Emergency Department
Admission: EM | Admit: 2024-07-25 | Discharge: 2024-07-25 | Disposition: A | Discharge status: Home / Self Care | Attending: Emergency Medicine | Admitting: Emergency Medicine

## 2024-07-25 ENCOUNTER — Emergency Department

## 2024-07-25 DIAGNOSIS — M5412 Radiculopathy, cervical region: Secondary | ICD-10-CM | POA: Insufficient documentation

## 2024-07-25 DIAGNOSIS — E1165 Type 2 diabetes mellitus with hyperglycemia: Secondary | ICD-10-CM | POA: Insufficient documentation

## 2024-07-25 DIAGNOSIS — Y900 Blood alcohol level of less than 20 mg/100 ml: Secondary | ICD-10-CM | POA: Insufficient documentation

## 2024-07-25 DIAGNOSIS — F17200 Nicotine dependence, unspecified, uncomplicated: Secondary | ICD-10-CM | POA: Insufficient documentation

## 2024-07-25 LAB — CBC
HCT: 50.7 % (ref 39.0–52.0)
Hemoglobin: 17.8 g/dL — ABNORMAL HIGH (ref 13.0–17.0)
MCH: 31.9 pg (ref 26.0–34.0)
MCHC: 35.1 g/dL (ref 30.0–36.0)
MCV: 90.9 fL (ref 80.0–100.0)
Platelets: 192 K/uL (ref 150–400)
RBC: 5.58 MIL/uL (ref 4.22–5.81)
RDW: 13.3 % (ref 11.5–15.5)
WBC: 8.6 K/uL (ref 4.0–10.5)
nRBC: 0 % (ref 0.0–0.2)

## 2024-07-25 LAB — DIFFERENTIAL
Abs Immature Granulocytes: 0.05 K/uL (ref 0.00–0.07)
Basophils Absolute: 0 K/uL (ref 0.0–0.1)
Basophils Relative: 1 %
Eosinophils Absolute: 0.2 K/uL (ref 0.0–0.5)
Eosinophils Relative: 2 %
Immature Granulocytes: 1 %
Lymphocytes Relative: 21 %
Lymphs Abs: 1.8 K/uL (ref 0.7–4.0)
Monocytes Absolute: 0.4 K/uL (ref 0.1–1.0)
Monocytes Relative: 4 %
Neutro Abs: 6.2 K/uL (ref 1.7–7.7)
Neutrophils Relative %: 71 %

## 2024-07-25 LAB — COMPREHENSIVE METABOLIC PANEL WITH GFR
ALT: 39 U/L (ref 0–44)
AST: 28 U/L (ref 15–41)
Albumin: 3.6 g/dL (ref 3.5–5.0)
Alkaline Phosphatase: 432 U/L — ABNORMAL HIGH (ref 38–126)
Anion gap: 14 (ref 5–15)
BUN: 19 mg/dL (ref 6–20)
CO2: 25 mmol/L (ref 22–32)
Calcium: 9.7 mg/dL (ref 8.9–10.3)
Chloride: 93 mmol/L — ABNORMAL LOW (ref 98–111)
Creatinine, Ser: 1.05 mg/dL (ref 0.61–1.24)
GFR, Estimated: >60 mL/min (ref >60)
Glucose, Bld: 542 mg/dL (ref 70–99)
Potassium: 3.8 mmol/L (ref 3.5–5.1)
Sodium: 132 mmol/L — ABNORMAL LOW (ref 135–145)
Total Bilirubin: 0.4 mg/dL (ref 0.0–1.2)
Total Protein: 7.3 g/dL (ref 6.5–8.1)

## 2024-07-25 LAB — APTT: aPTT: 36 s (ref 24–36)

## 2024-07-25 LAB — CBG MONITORING, ED
Glucose-Capillary: 512 mg/dL (ref 70–99)
Glucose-Capillary: 498 mg/dL — ABNORMAL HIGH (ref 70–99)
Glucose-Capillary: 396 mg/dL — ABNORMAL HIGH (ref 70–99)
Glucose-Capillary: 423 mg/dL — ABNORMAL HIGH (ref 70–99)
Glucose-Capillary: 327 mg/dL — ABNORMAL HIGH (ref 70–99)

## 2024-07-25 LAB — ETHANOL: Alcohol, Ethyl (B): <15 mg/dL (ref <15)

## 2024-07-25 LAB — PROTIME-INR
INR: 0.8 (ref 0.8–1.2)
Prothrombin Time: 11.5 s (ref 11.4–15.2)

## 2024-07-25 MED ORDER — SODIUM CHLORIDE 0.9% FLUSH
3.0000 mL | Freq: Once | INTRAVENOUS | Status: AC
  Administered 2024-07-25: 3 mL via INTRAVENOUS

## 2024-07-25 MED ORDER — METFORMIN HCL 500 MG PO TABS
500.0000 mg | ORAL_TABLET | Freq: Once | ORAL | Status: AC
  Administered 2024-07-25: 500 mg via ORAL
  Filled 2024-07-25: qty 1

## 2024-07-25 MED ORDER — EMPAGLIFLOZIN 10 MG PO TABS
10.0000 mg | ORAL_TABLET | Freq: Every day | ORAL | Status: DC
  Administered 2024-07-25: 10 mg via ORAL
  Filled 2024-07-25: qty 1

## 2024-07-25 MED ORDER — INSULIN ASPART 100 UNIT/ML IJ SOLN
8.0000 [IU] | Freq: Once | INTRAMUSCULAR | Status: AC
  Administered 2024-07-25: 8 [IU] via SUBCUTANEOUS
  Filled 2024-07-25: qty 8

## 2024-07-25 NOTE — Discharge Instructions (Signed)
 Please make certain to follow-up with neurosurgeon this week.  You indicated you would like to see your doctor who you have scheduled for Tuesday at [facility].  Please make certain to attend that appointment.  If you cannot make that or change your mind Dr. Katrina our neurosurgeon would be happy to see you this week.  Please call the office to schedule if needed.  Return to the ER right away if you notice that she have loss of use of the hand difficulty with fingers or if you have increased weakness in the arm severe pain difficulty walking or other new concerns arise.  Please make certain to resume all of your normal diabetes medications tomorrow morning as well as your blood sugar was elevated here and it is important that you continue to follow-up with your doctor and take your prescribed medications.

## 2024-07-25 NOTE — ED Provider Notes (Signed)
 Saw patient at front, he had reported numbness to his right upper extremity 2 hours ago.  States that he had a screw taken out from his neck, has been having several weeks of decreased sensation to his right upper extremity up to the mid forearm.  States that he went to the bathroom and noticed that the decreased sensation was now to his entire arm.  Denies any other weakness or numbness.  No trauma or falls.  Has some decreased strength to the right upper extremity that is normal for him secondary to pain.  On assessment his pupils are equal, cranial nerves are intact, he has very mild decrease sensation to the right upper extremity, he has some mild weakness to his right upper extremity that he says is chronic for him, grip strength appears to be equal bilaterally, no other focal weakness or numbness.  Given that decrease sensation started several weeks ago, no other associated symptoms.  Stroke alert was not activated.   Waymond Lorelle Cummins, MD 07/25/24 [phone]

## 2024-07-25 NOTE — ED Provider Notes (Signed)
 [facility] Provider Note    Event Date/Time   First MD Initiated Contact with Patient 07/25/24 [phone]     (approximate)   History   Numbness   HPI  Nesbit Michon. is a 53 y.o. male with a history of spinal surgeries involving the cervical spine  He also has other medical problems including cardiomyopathy, diabetes,   Patient reports that about 5 to 6 weeks ago he felt like a screw came out in one of the spaces of the cervical spine.  Did not actually pop out through skin or anything but he just feels like something loose in the knees been having numbness in the right arm and hand since.  It seems like it slowly gotten worse a little bit more so over the last few days as well.  The arm is chronically weak difficult to raise above his shoulder which is normal and the muscles in that arm have slowly weakened over time  He tells me that he actually saw a neurosurgeon for this at a different hospital several weeks ago and they told him that they would need to do surgery on him eventually but they would not do it until he had not smoked for 6 weeks but he has not been able to stop smoking.  He actually has made an appointment and has an appointment at [facility] with you reports neurosurgery at [facility] coming up on Tuesday this week.  He was told they might be able to do his surgery while he is still smoking   He has no new weakness but he reports that right hand feels more numb than typical but he had numbness from his shoulder all the way down to his right elbow for about 5 to 6 weeks and saw a neurosurgeon for at some other hospital  No chest pain no other complaints.  He is thirsty would like something to eat drink  Said no facial weakness no difficulty walking no numbness anywhere else.  The numbness does not extend over his face it is just from the shoulder down to the right hand and he still able to use the right hand and arm as normal but they are chronically  weak  Physical Exam   Triage Vital Signs: ED Triage Vitals  Encounter Vitals Group     BP 07/25/24 1601 121/76     Girls Systolic BP Percentile --      Girls Diastolic BP Percentile --      Boys Systolic BP Percentile --      Boys Diastolic BP Percentile --      Pulse Rate 07/25/24 1601 90     Resp 07/25/24 1601 18     Temp 07/25/24 1601 98.1 F (36.7 C)     Temp src --      SpO2 07/25/24 1601 97 %     Weight --      Height --      Head Circumference --      Peak Flow --      Pain Score 07/25/24 1454 5     Pain Loc --      Pain Education --      Exclude from Growth Chart --     Most recent vital signs: Vitals:   07/25/24 1604 07/25/24 2035  BP:  111/67  Pulse:  85  Resp:  18  Temp:  97.9 F (36.6 C)  SpO2: 97% 95%     General: Awake, no  distress.  CV:  Good peripheral perfusion.  Resp:  Normal effort.  Abd:  No distention.  Other:  Able to abduct the right arm to about the level of the shoulder.  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  He has sensation intact but is diminished over the right hand right arm right shoulder and some towards the right mid cervical neck.  He speaks clearly extraocular movements are normal he uses other extremities well he is able to stand from wheelchair to bed and has no noted motor weakness anywhere with exception to what he describes as a very chronic weakness in his right hand and arm.  He still able to use the fingers of the right hand   ED Results / Procedures / Treatments   Labs (all labs ordered are listed, but only abnormal results are displayed) Labs Reviewed  CBC - Abnormal; Notable for the following components:      Result Value   Hemoglobin 17.8 (*)    All other components within normal limits  COMPREHENSIVE METABOLIC PANEL WITH GFR - Abnormal; Notable for the following components:   Sodium 132 (*)    Chloride 93 (*)    Glucose, Bld 542 (*)    Alkaline Phosphatase 432 (*)    All  other components within normal limits  CBG MONITORING, ED - Abnormal; Notable for the following components:   Glucose-Capillary 512 (*)    All other components within normal limits  CBG MONITORING, ED - Abnormal; Notable for the following components:   Glucose-Capillary 498 (*)    All other components within normal limits  CBG MONITORING, ED - Abnormal; Notable for the following components:   Glucose-Capillary 423 (*)    All other components within normal limits  CBG MONITORING, ED - Abnormal; Notable for the following components:   Glucose-Capillary 396 (*)    All other components within normal limits  PROTIME-INR  APTT  DIFFERENTIAL  ETHANOL  CBG MONITORING, ED   Blood glucose improving.  Patient appropriate for discharge  EKG  And inter by me at 1530 heart rate 90 QRS 100 QTc 470, normal sinus rhythm no evidence of ischemia   RADIOLOGY   CT head is interpreted by me as normal     MR Cervical Spine Wo Contrast Result Date: 07/25/2024 EXAM: MRI CERVICAL SPINE WITHOUT CONTRAST 07/25/2024 04:55:32 PM TECHNIQUE: Multiplanar multisequence MRI of the cervical spine was performed. COMPARISON: MRI of the cervical spine 03/18/2024 and CT of the cervical spine 03/18/2024. CLINICAL HISTORY: Cervical radiculopathy, no red flags; Right arm numbness, ongoing for 5 weeks. Concern for hardware malfunction. FINDINGS: BONES AND ALIGNMENT: Anterior cervical fusion is present at C4-C5, C5-C6, C6-C7, and C7-T1. Posterior hardware extends from C3 to T1. Straightening of the normal cervical lordosis and stable. Normal vertebral body heights. Bone marrow signal is unremarkable. SPINAL CORD: Normal spinal cord size. No abnormal spinal cord signal. SOFT TISSUES: No paraspinal mass. C2-C3: Uncovertebral and facet hypertrophy result in stable mild foraminal narrowing bilaterally. No significant disc herniation. No spinal canal stenosis. C3-C4: Broad based disc protrusion is again noted at C3-C4 with  moderate to severe central canal stenosis and severe foraminal narrowing bilaterally, right greater than left. C4-C5: The central canal is decompressed at C4-C5. Moderate foraminal narrowing is present bilaterally. No significant disc herniation. C5-C6: The central canal is decompressed at C5-C6. Moderate residual foraminal narrowing stenosis is worse on the right. No significant disc herniation. C6-C7: The central canal is decompressed at C6-C7.  No significant stenosis is present. No significant disc herniation. C7-T1: Moderate facet hypertrophy and spurring is present at C7-T1 with right greater than left moderate foraminal stenosis. No significant disc herniation. No spinal canal stenosis. T1-T2: Advanced facet hypertrophy is present at T1-T2. No significant stenosis is present. No significant disc herniation. No spinal canal stenosis or neural foraminal narrowing. IMPRESSION: 1. 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 2. Moderate residual foraminal narrowing at C5-6, worse on the right. 3. Moderate foraminal narrowing bilaterally at C4-5. 4. Right greater than left moderate foraminal stenosis at C7-T1. 5. Stable mild foraminal narrowing bilaterally at C2-3. 6. Stable postoperative changes . Electronically signed by: Lonni Necessary MD 07/25/2024 05:31 PM EST RP Workstation: [id number]   MR BRAIN WO CONTRAST Result Date: 07/25/2024 EXAM: MRI BRAIN WITHOUT CONTRAST 07/25/2024 04:55:32 PM TECHNIQUE: Multiplanar multisequence MRI of the head/brain was performed without the administration of intravenous contrast. COMPARISON: CT head without contrast 07/25/2024 and MR head without contrast 03/18/2024. CLINICAL HISTORY: Numbness or tingling, paresthesia (Ped 0-17y); R arm, thinks a 'screw came out' of his neck 5-6 weeks ago. FINDINGS: BRAIN AND VENTRICLES: Periventricular and scattered subcortical T2  hyperintensities bilaterally are mildly advanced for age, stable. Torn walled cysts are stable. No acute infarct. No intracranial hemorrhage. No mass. No midline shift. No hydrocephalus. The sella is unremarkable. Normal flow voids. ORBITS: No acute abnormality. SINUSES AND MASTOIDS: Small mastoid effusions are present. No obstructing nasopharyngeal lesion is present. BONES AND SOFT TISSUES: Normal marrow signal. No acute soft tissue abnormality. IMPRESSION: 1. No acute intracranial abnormality. 2. Stable periventricular and scattered subcortical T2 hyperintensities bilaterally, mildly advanced for age. Electronically signed by: Lonni Necessary MD 07/25/2024 05:22 PM EST RP Workstation: [id number]   CT HEAD WO CONTRAST Result Date: 07/25/2024 EXAM: CT HEAD WITHOUT CONTRAST 07/25/2024 03:38:29 PM TECHNIQUE: CT of the head was performed without the administration of intravenous contrast. Automated exposure control, iterative reconstruction, and/or weight based adjustment of the mA/kV was utilized to reduce the radiation dose to as low as reasonably achievable. COMPARISON: None available. CLINICAL HISTORY: Arm numbness Arm numbness FINDINGS: BRAIN AND VENTRICLES: No acute hemorrhage. No evidence of acute infarct. No hydrocephalus. No extra-axial collection. No mass effect or midline shift. ORBITS: No acute abnormality. SINUSES: No acute abnormality. SOFT TISSUES AND SKULL: No acute soft tissue abnormality. No skull fracture. IMPRESSION: 1. No acute intracranial abnormality. Electronically signed by: Gilmore Molt MD 07/25/2024 03:53 PM EST RP Workstation: [id number]      PROCEDURES:  Critical Care performed: No  Procedures   MEDICATIONS ORDERED IN ED: Medications  empagliflozin  (JARDIANCE ) tablet 10 mg (10 mg Oral Given 07/25/24 1942)  sodium chloride  flush (NS) 0.9 % injection 3 mL (3 mLs Intravenous Given 07/25/24 1625)  insulin  aspart (novoLOG ) injection 8 Units (8 Units Subcutaneous Given  07/25/24 1944)  metFORMIN  (GLUCOPHAGE ) tablet 500 mg (500 mg Oral Given 07/25/24 1942)     IMPRESSION / MDM / ASSESSMENT AND PLAN / ED COURSE  I reviewed the triage vital signs and the nursing notes.                              Differential diagnosis includes, but is not limited to, most likely some sort of peripheral neuropathy or impingement.  He has been seeing a neurosurgeon told he may need surgery for potentially displaced screw over a month ago and has  had symptoms since then but some extension now more of the numbness seeming to extend down towards the right hand.  Decreased motor strength but this appears to be chronic with some muscular atrophy apparent.  He does not demonstrate a dense loss of sensation or the inability to use the hand or flex at the elbow etc.  I have ordered MRI of the brain as well as cervical spine I suspect this is likely cervical in nature.  He is already established with neurosurgeon, and now he actually tells me he has a's neurosurgery appointment Tuesday at [facility] as well.  I think if MRI of the brain and neck does not show an obvious emergent pathology he would likely benefit by close follow-up with neurosurgery at [facility] and with his original neurosurgeon as he reports he is done.  Labs were drawn at triage, and the scope of things I do not think he actually needed them however his glucose was noted to be 512.  I will make sure he does not have any obvious evidence of DKA or diabetic emergency but at this point suspect this is unlikely  Patient's presentation is most consistent with acute complicated illness / injury requiring diagnostic workup.      Clinical Course as of 07/25/24 2156  [place] Jul 25, 2024  8070 Sent request to Dr. Katrina to review patient's clinical history and imaging studies from today for treatment recommendation. [MQ]  1929 Discussed with the patient is not taking any insulin  actively.  He does report having prescriptions for 3  diabetes medications.  We will give him his metformin  and Jardiance .  I will also give him a dose of insulin  and recheck glucose in about an hour.  Current goal is to reduce his glucose to less than 350.  His CO2 is appropriate his anion gap is normal he has no symptoms suggest acute HHS or evidence of either or DKA by labs today.  He reports he does have his medications, encourage compliance.  Follow-up with [facility] neurosurgery on Tuesday as his current plan [MQ]  1930 Glucose(!!): 542 Notable hyperglycemia but no evidence of DKA [MQ]    Clinical Course User Index [MQ] Dicky Anes, MD   ----------------------------------------- 7:32 PM on 07/25/2024 ----------------------------------------- Dr. Precious reviewed MRI imaging.  Advising that as long as patient has at least 4-5 strength and walking without difficulty he can follow-up outpatient.  Patient with no difficulty with ambulation and he has good strength actually 5 out of 5 in the right upper extremity without increasing numbness being the primary issue.  At this juncture plan to discharge patient once glucose improved control and he is going to follow-up with neurosurgery Tuesday at [facility], but also I will provide patient information he can follow-up closely with neurosurgery here as an alternative should he desire.  Blood sugar has improved with glucose.  Patient will make certain to be compliant with his home medications.  He will follow-up with [facility] neurosurgery on Tuesday.  He is also offered that he may follow-up closely with Dr. Katrina, he advises since he has he has an appointment [facility] neurosurgery Tuesday intends to follow-up with them but is aware that he can follow-up here with neurosurgery as well if needed and they would be happy to see him in the clinic.  Discussed careful return precautions in particular signs and symptoms related to worsening neurologic function difficulties in the hand difficulty walking, increasing weakness  numbness etc.  Patient agreeable with this plan.  Fully alert  resting comfortably at this time.  Return precautions and treatment recommendations and follow-up discussed with the patient who is agreeable with the plan.   FINAL CLINICAL IMPRESSION(S) / ED DIAGNOSES   Final diagnoses:  Radiculopathy, cervical     Rx / DC Orders   ED Discharge Orders          Ordered    Ambulatory referral to Neurosurgery       Comments: Please Select To Department: [facility] for Nerve or Spine  Please select To Department: [facility] AT [facility] for Cranial or Neurovascular   07/25/24 1935             Note:  This document was prepared using Dragon voice recognition software and may include unintentional dictation errors.   Dicky Anes, MD 07/25/24 [phone]

## 2024-07-25 NOTE — ED Triage Notes (Signed)
 Pt comes with right arm numbness. Pt states this started two hours ago. Pt states diff feeling on arms. Pt was assessed by ED MD and no stroke to be called. Pt did have neck surgery years ago. Pt did have screw pop out 5 weeks ago and suppose to follow and has appt in 6 weeks.   Pt is diabetic but doesn't check hi sugar. EMS cbg was high  CBG 448 by ems and then staff rechecked and result in chart.  161/98 98% RA HR 90 98.9 temp

## 2024-07-25 NOTE — ED Notes (Signed)
 Unable to get blood from pt. Lab called

## 2024-07-25 NOTE — ED Notes (Signed)
 This RN and Dr. Tan at the EMS bay to assess pt, per en code pt had R arm numbness that started 2 hours ago. When pt explained more of the story. Pt has had numbness in his R hand for the past 5 weeks and 2 hours ago the numbness got worse and travelled up his arm. Dr. Waymond and RN decided not to called a Code Stroke at this time

## 2024-09-02 ENCOUNTER — Encounter: Payer: Self-pay | Admitting: Orthopedic Surgery

## 2024-09-08 ENCOUNTER — Ambulatory Visit: Admitting: Orthopedic Surgery

## 2024-09-09 NOTE — Progress Notes (Unsigned)
 "   Referring Physician:  Dicky Anes, MD [id number] [place],  [place] [address]  Primary Physician:  [facility]  History of Present Illness: 09/09/2024 Mr. Mackie Holness is here today with a chief complaint of ***  Chronic weakness in the right arm/hand. He is having more numbness in the right hand. Any neck pain?  Saw Neurosurgery at [facility] and was offered surgery but was told he would need to stop smoking     Duration: *** Location: *** Quality: *** Severity: ***  Precipitating: aggravated by *** Modifying factors: made better by *** Weakness: none Timing: *** Bowel/Bladder Dysfunction: none  Conservative measures:  Physical therapy: *** has not participated in? Multimodal medical therapy including regular antiinflammatories: *** tylenol , baclofen , oxycodone , lyrica   Injections: *** no epidural steroid injections  Past Surgery: *** 04/25/21: C4-T1 Fusion 05/16/22: C3-T2 posterior fusion with laminectomy  Rosalynn L Raphael [name] has ***no symptoms of cervical myelopathy.  The symptoms are causing a significant impact on the patient's life.   I have utilized the care everywhere function in epic to review the outside records available from external health systems.   Review of Systems:  A 10 point review of systems is negative, except for the pertinent positives and negatives detailed in the HPI.  Past Medical History: Past Medical History:  Diagnosis Date   CHF (congestive heart failure) (HCC)    Coronary artery disease    Diabetes mellitus without complication (HCC)    Ischemic cardiomyopathy    Myocardial infarction (HCC)    inferior STEMI ~ 2016; 01/19/21 with CHB/cardiogenic shock requiring brief TVP, IABP, s/p aspiration thrombectomy/ballon angioplasty for late thrombosis mRCA stent & DES for acute occlusion mLCX    Past Surgical History: Past Surgical History:  Procedure Laterality Date    C3-T2 posterior cervical fusion with laminectomy   05/16/2022    C4-T1 ACDF  04/2023   AMPUTATION Right 10/01/2020   Procedure: AMPUTATION 5th  RAY AND IRRIGATION AND DEBRIDEMENT;  Surgeon: Ashley Soulier, DPM;  Location: [facility] ORS;  Service: Podiatry;  Laterality: Right;   AMPUTATION Right 10/11/2020   Procedure: AMPUTATION RAY;  Surgeon: Lennie Barter, DPM;  Location: [facility];  Service: Podiatry;  Laterality: Right;   ANTERIOR CERVICAL DECOMPRESSION/DISCECTOMY FUSION 4 LEVELS N/A 04/25/2021   Procedure: Cervical Four-Five, Cervical Five-Six, Cervical Six-Seven, Cervical Seven- Thoracic One  Anterior cervical decompression/Discectomy/fusion;  Surgeon: Cheryle Debby LABOR, MD;  Location: [facility] OR;  Service: Neurosurgery;  Laterality: N/A;  Cervical Four-Five, Cervical Five-Six, Cervical Six-Seven, Cervical Seven- Thoracic One  Anterior cervical decompression/Discectomy/fusion   BACK SURGERY     CARDIAC CATHETERIZATION     CORONARY THROMBECTOMY N/A 01/19/2021   Procedure: Coronary Thrombectomy;  Surgeon: Dann Candyce RAMAN, MD;  Location: [facility] INVASIVE CV LAB;  Service: Cardiovascular;  Laterality: N/A;   CORONARY/GRAFT ACUTE MI REVASCULARIZATION N/A 01/19/2021   Procedure: Coronary/Graft Acute MI Revascularization;  Surgeon: Dann Candyce RAMAN, MD;  Location: [facility] INVASIVE CV LAB;  Service: Cardiovascular;  Laterality: N/A;   IABP INSERTION N/A 01/19/2021   Procedure: IABP Insertion;  Surgeon: Dann Candyce RAMAN, MD;  Location: [facility] IN[facility];  Service: Cardiovascular;  Laterality: N/A;   INCISION AND DRAINAGE Right 09/29/2020   Procedure: INCISION AND DRAINAGE, RIGHT FOOT;  Surgeon: Ashley Soulier, DPM;  Location: [facility];  Service: Podiatry;  Laterality: Right;   LEFT HEART CATH AND CORONARY ANGIOGRAPHY N/A 01/19/2021   Procedure: LEFT HEART CATH AND CORONARY ANGIOGRAPHY;  Surgeon: Dann Candyce RAMAN, MD;  Location: [facility]  INVASIVE CV LAB;  Service: Cardiovascular;  Laterality: N/A;   LOWER EXTREMITY ANGIOGRAPHY Right 10/03/2020    Procedure: Lower Extremity Angiography;  Surgeon: Jama Cordella MATSU, MD;  Location: [facility] INVASIVE CV LAB;  Service: Cardiovascular;  Laterality: Right;   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 and status post removal of hardware  12/20/2021   TENDON LENGTHENING Right 10/11/2020   Procedure: TENDON LENGTHENING;  Surgeon: Lennie Barter, DPM;  Location: [facility];  Service: Podiatry;  Laterality: Right;    Allergies: Allergies as of 09/14/2024   (No Known Allergies)    Medications: Current Medications[1]  Social History: Social History[2]  Family Medical History: Family History  Problem Relation Age of Onset   Breast cancer Mother    Heart attack Father    Diabetes Father    Heart disease Father    Lung cancer Paternal Grandmother     Physical Examination: There were no vitals filed for this visit.  General: Patient is in no apparent distress. Attention to examination is appropriate.  Neck:   Supple.  Full range of motion.  Respiratory: Patient is breathing without any difficulty.   NEUROLOGICAL:     Awake, alert, oriented to person, place, and time.  Speech is clear and fluent.   Cranial Nerves: Pupils equal round and reactive to light.  Facial tone is symmetric.  Facial sensation is symmetric. Shoulder shrug is symmetric. Tongue protrusion is midline.  There is no pronator drift.  Strength: Side Biceps Triceps Deltoid Interossei Grip Wrist Ext. Wrist Flex.  R 5 5 5 5 5 5 5   L 5 5 5 5 5 5 5    Side Iliopsoas Quads Hamstring PF DF EHL  R 5 5 5 5 5 5   L 5 5 5 5 5 5    Reflexes are ***2+ and symmetric at the biceps, triceps, brachioradialis, patella and achilles.   Hoffman's is absent.   Bilateral upper and lower extremity sensation is intact to light touch.    No evidence of dysmetria noted.  Gait is normal.     Medical Decision Making  Imaging: ***  I have personally reviewed the images and agree with the above  interpretation.  Assessment and Plan: Mr. Vos is a pleasant 54 y.o. male with ***      Thank you for involving me in the care of this patient.      Chester K. Clois MD, [facility] Neurosurgery     [1]  Current Outpatient Medications:    acetaminophen  (TYLENOL ) 650 MG CR tablet, Take 1 tablet by mouth every 8 (eight) hours as needed for pain., Disp: , Rfl:    albuterol  (PROVENTIL ) (2.5 MG/3ML) 0.083% nebulizer solution, Take 3 mLs (2.5 mg total) by nebulization every 4 (four) hours as needed for wheezing or shortness of breath., Disp: 75 mL, Rfl: 12   albuterol  (VENTOLIN  HFA) 108 (90 Base) MCG/ACT inhaler, Inhale 2 puffs into the lungs every 6 (six) hours as needed for wheezing., Disp: , Rfl:    ammonium lactate (LAC-HYDRIN) 12 % lotion, Apply 1 Application topically as needed for dry skin., Disp: , Rfl:    Artificial Saliva (BIOTENE ORALBALANCE DRY MOUTH) GEL, Use as directed 1 application  in the mouth or throat as needed., Disp: , Rfl:    aspirin  81 MG chewable tablet, Chew 81 mg by mouth daily., Disp: , Rfl:    Baclofen  5 MG TABS, Take 15 mg by mouth 3 (three) times daily., Disp: , Rfl:    bismuth   subsalicylate (PEPTO BISMOL) 262 MG/15ML suspension, Take 30 mLs by mouth every 4 (four) hours as needed., Disp: , Rfl:    carboxymethylcellulose (REFRESH PLUS) 0.5 % SOLN, Place 1 drop into both eyes 4 (four) times daily., Disp: , Rfl:    ciclopirox (LOPROX) 0.77 % cream, Apply 1 application  topically 2 (two) times daily., Disp: , Rfl:    clopidogrel  (PLAVIX ) 75 MG tablet, Take 1 tablet (75 mg total) by mouth daily., Disp: 90 tablet, Rfl: 3   Dextrose , Diabetic Use, (INSTA-GLUCOSE) 77.4 % GEL, Take 31 g by mouth.  Take 31 g (24 g of dextrose  total) by mouth every fifteen (15) minutes as needed for low blood sugar., Disp: , Rfl:    DULoxetine  (CYMBALTA ) 60 MG capsule, Take 120 mg by mouth at bedtime., Disp: , Rfl:    empagliflozin  (JARDIANCE ) 10 MG TABS tablet, Take 10 mg by mouth  daily., Disp: , Rfl:    ezetimibe  (ZETIA ) 10 MG tablet, Take 10 mg by mouth daily., Disp: , Rfl:    ferrous sulfate  325 (65 FE) MG tablet, Take 325 mg by mouth daily with breakfast., Disp: , Rfl:    fluticasone  (FLONASE ) 50 MCG/ACT nasal spray, Place 1 spray into both nostrils daily as needed., Disp: , Rfl:    furosemide  (LASIX ) 40 MG tablet, Take 1 tablet (40 mg total) by mouth daily as needed., Disp: , Rfl:    glipiZIDE  (GLUCOTROL ) 10 MG tablet, Take 10 mg by mouth daily., Disp: , Rfl:    Glucagon, rDNA, (GLUCAGON EMERGENCY) 1 MG KIT, Inject into the muscle as needed., Disp: , Rfl:    insulin  aspart (NOVOLOG ) 100 UNIT/ML injection, Inject 0-9 Units into the skin every 4 (four) hours., Disp: 10 mL, Rfl: 11   levETIRAcetam  (KEPPRA ) 500 MG tablet, Take 500 mg by mouth 2 (two) times daily., Disp: , Rfl:    levofloxacin  (LEVAQUIN ) 750 MG tablet, Take 1 tablet (750 mg total) by mouth daily., Disp: 7 tablet, Rfl: 0   loperamide  (IMODIUM ) 2 MG capsule, Take 2 mg by mouth as needed for diarrhea or loose stools., Disp: , Rfl:    losartan  (COZAAR ) 25 MG tablet, Take 0.5 tablets by mouth daily., Disp: , Rfl:    melatonin 3 MG TABS tablet, Take 3 mg by mouth at bedtime., Disp: , Rfl:    Menthol , Topical Analgesic, (BIOFREEZE) 4 % GEL, Apply topically every 8 (eight) hours as needed., Disp: , Rfl:    metFORMIN  (GLUCOPHAGE ) 1000 MG tablet, Take 500 mg by mouth daily with breakfast., Disp: , Rfl:    nicotine  (NICODERM CQ  - DOSED IN MG/24 HOURS) 21 mg/24hr patch, Place 1 patch (21 mg total) onto the skin daily., Disp: 28 patch, Rfl: 0   nitroGLYCERIN  (NITROSTAT ) 0.4 MG SL tablet, Place 0.4 mg under the tongue every 5 (five) minutes as needed for chest pain., Disp: , Rfl:    Oxycodone  HCl 10 MG TABS, Take 5 mg by mouth every 4 (four) hours as needed., Disp: , Rfl:    polyethylene glycol (MIRALAX  / GLYCOLAX ) 17 g packet, Take 17 g by mouth daily as needed., Disp: , Rfl:    pregabalin  (LYRICA ) 150 MG capsule, Take  150 mg by mouth 3 (three) times daily., Disp: , Rfl:    rosuvastatin  (CRESTOR ) 40 MG tablet, Take 1 tablet (40 mg total) by mouth daily., Disp: 90 tablet, Rfl: 3   tamsulosin  (FLOMAX ) 0.4 MG CAPS capsule, Take 0.4 mg by mouth at bedtime., Disp: , Rfl:  vitamin C (ASCORBIC ACID) 250 MG tablet, Take 250 mg by mouth daily., Disp: , Rfl:  [2]  Social History Tobacco Use   Smoking status: Every Day    Current packs/day: 1.00    Average packs/day: 1 pack/day for 33.0 years (33.0 ttl pk-yrs)    Types: Cigarettes   Smokeless tobacco: Never  Vaping Use   Vaping status: Never Used  Substance Use Topics   Alcohol  use: Not Currently    Alcohol /week: 0.0 - 1.0 standard drinks of alcohol    Drug use: No   "

## 2024-09-14 ENCOUNTER — Ambulatory Visit: Admitting: Neurosurgery

## 2024-09-22 ENCOUNTER — Telehealth: Payer: Self-pay | Admitting: Orthopedic Surgery

## 2024-09-22 NOTE — Telephone Encounter (Signed)
 LVM for patient to call back and schedule New Patient appt w/ MD  !*Neck pain- needs to see MD per [facility]

## 2024-10-05 ENCOUNTER — Ambulatory Visit: Admitting: Neurosurgery
